# Patient Record
Sex: Female | Born: 1935 | Race: White | Hispanic: No | Smoking: Never smoker
Health system: Southern US, Community
[De-identification: ages and names within clinical notes are randomized; demographics above are authoritative.]

## PROBLEM LIST (undated history)

## (undated) DIAGNOSIS — S32000A Wedge compression fracture of unspecified lumbar vertebra, initial encounter for closed fracture: Secondary | ICD-10-CM

## (undated) DIAGNOSIS — I5022 Chronic systolic (congestive) heart failure: Secondary | ICD-10-CM

## (undated) DIAGNOSIS — I2542 Coronary artery dissection: Secondary | ICD-10-CM

## (undated) DIAGNOSIS — M109 Gout, unspecified: Secondary | ICD-10-CM

## (undated) DIAGNOSIS — Z9889 Other specified postprocedural states: Secondary | ICD-10-CM

## (undated) DIAGNOSIS — K219 Gastro-esophageal reflux disease without esophagitis: Secondary | ICD-10-CM

## (undated) DIAGNOSIS — Z8542 Personal history of malignant neoplasm of other parts of uterus: Secondary | ICD-10-CM

## (undated) DIAGNOSIS — I341 Nonrheumatic mitral (valve) prolapse: Secondary | ICD-10-CM

## (undated) DIAGNOSIS — I10 Essential (primary) hypertension: Secondary | ICD-10-CM

## (undated) DIAGNOSIS — M519 Unspecified thoracic, thoracolumbar and lumbosacral intervertebral disc disorder: Secondary | ICD-10-CM

## (undated) DIAGNOSIS — M069 Rheumatoid arthritis, unspecified: Secondary | ICD-10-CM

## (undated) DIAGNOSIS — R112 Nausea with vomiting, unspecified: Secondary | ICD-10-CM

## (undated) DIAGNOSIS — I428 Other cardiomyopathies: Secondary | ICD-10-CM

## (undated) DIAGNOSIS — R001 Bradycardia, unspecified: Secondary | ICD-10-CM

## (undated) DIAGNOSIS — I214 Non-ST elevation (NSTEMI) myocardial infarction: Secondary | ICD-10-CM

## (undated) DIAGNOSIS — E785 Hyperlipidemia, unspecified: Secondary | ICD-10-CM

## (undated) DIAGNOSIS — I4821 Permanent atrial fibrillation: Secondary | ICD-10-CM

## (undated) HISTORY — PX: HAMMER TOE SURGERY: SHX385

## (undated) HISTORY — DX: Nonrheumatic mitral (valve) prolapse: I34.1

## (undated) HISTORY — PX: OTHER SURGICAL HISTORY: SHX169

## (undated) HISTORY — PX: APPENDECTOMY: SHX54

---

## 1998-07-10 HISTORY — PX: ABDOMINAL HYSTERECTOMY: SHX81

## 1999-02-02 ENCOUNTER — Encounter (INDEPENDENT_AMBULATORY_CARE_PROVIDER_SITE_OTHER): Payer: Self-pay

## 1999-02-02 ENCOUNTER — Other Ambulatory Visit: Admission: RE | Admit: 1999-02-02 | Discharge: 1999-02-02 | Payer: Self-pay | Admitting: *Deleted

## 1999-02-14 ENCOUNTER — Inpatient Hospital Stay (HOSPITAL_COMMUNITY): Admission: RE | Admit: 1999-02-14 | Discharge: 1999-02-18 | Payer: Self-pay | Admitting: *Deleted

## 1999-02-25 ENCOUNTER — Encounter: Admission: RE | Admit: 1999-02-25 | Discharge: 1999-05-26 | Payer: Self-pay | Admitting: *Deleted

## 1999-05-27 ENCOUNTER — Other Ambulatory Visit: Admission: RE | Admit: 1999-05-27 | Discharge: 1999-05-27 | Payer: Self-pay | Admitting: *Deleted

## 1999-07-11 DIAGNOSIS — I428 Other cardiomyopathies: Secondary | ICD-10-CM

## 1999-07-11 HISTORY — PX: LUMBAR LAMINECTOMY: SHX95

## 1999-07-11 HISTORY — DX: Other cardiomyopathies: I42.8

## 1999-08-19 ENCOUNTER — Encounter: Admission: RE | Admit: 1999-08-19 | Discharge: 1999-08-19 | Payer: Self-pay | Admitting: *Deleted

## 1999-08-19 ENCOUNTER — Other Ambulatory Visit: Admission: RE | Admit: 1999-08-19 | Discharge: 1999-08-19 | Payer: Self-pay | Admitting: *Deleted

## 1999-11-03 ENCOUNTER — Other Ambulatory Visit: Admission: RE | Admit: 1999-11-03 | Discharge: 1999-11-03 | Payer: Self-pay | Admitting: *Deleted

## 2000-02-17 ENCOUNTER — Other Ambulatory Visit: Admission: RE | Admit: 2000-02-17 | Discharge: 2000-02-17 | Payer: Self-pay | Admitting: *Deleted

## 2000-05-28 ENCOUNTER — Encounter: Payer: Self-pay | Admitting: Neurosurgery

## 2000-05-28 ENCOUNTER — Inpatient Hospital Stay (HOSPITAL_COMMUNITY): Admission: RE | Admit: 2000-05-28 | Discharge: 2000-05-30 | Payer: Self-pay | Admitting: Neurosurgery

## 2000-05-28 ENCOUNTER — Encounter (INDEPENDENT_AMBULATORY_CARE_PROVIDER_SITE_OTHER): Payer: Self-pay | Admitting: *Deleted

## 2000-06-20 ENCOUNTER — Other Ambulatory Visit: Admission: RE | Admit: 2000-06-20 | Discharge: 2000-06-20 | Payer: Self-pay | Admitting: *Deleted

## 2000-10-19 ENCOUNTER — Other Ambulatory Visit: Admission: RE | Admit: 2000-10-19 | Discharge: 2000-10-19 | Payer: Self-pay | Admitting: *Deleted

## 2001-04-10 ENCOUNTER — Other Ambulatory Visit: Admission: RE | Admit: 2001-04-10 | Discharge: 2001-04-10 | Payer: Self-pay | Admitting: *Deleted

## 2001-07-08 ENCOUNTER — Encounter: Payer: Self-pay | Admitting: Internal Medicine

## 2001-07-08 ENCOUNTER — Ambulatory Visit (HOSPITAL_COMMUNITY): Admission: RE | Admit: 2001-07-08 | Discharge: 2001-07-08 | Payer: Self-pay | Admitting: Internal Medicine

## 2001-10-22 ENCOUNTER — Other Ambulatory Visit: Admission: RE | Admit: 2001-10-22 | Discharge: 2001-10-22 | Payer: Self-pay | Admitting: *Deleted

## 2002-04-25 ENCOUNTER — Other Ambulatory Visit: Admission: RE | Admit: 2002-04-25 | Discharge: 2002-04-25 | Payer: Self-pay | Admitting: *Deleted

## 2002-07-08 ENCOUNTER — Ambulatory Visit (HOSPITAL_COMMUNITY): Admission: RE | Admit: 2002-07-08 | Discharge: 2002-07-08 | Payer: Self-pay | Admitting: Internal Medicine

## 2002-07-08 ENCOUNTER — Encounter: Payer: Self-pay | Admitting: Internal Medicine

## 2002-10-14 ENCOUNTER — Other Ambulatory Visit: Admission: RE | Admit: 2002-10-14 | Discharge: 2002-10-14 | Payer: Self-pay | Admitting: *Deleted

## 2003-04-14 ENCOUNTER — Other Ambulatory Visit: Admission: RE | Admit: 2003-04-14 | Discharge: 2003-04-14 | Payer: Self-pay | Admitting: *Deleted

## 2003-07-14 ENCOUNTER — Ambulatory Visit (HOSPITAL_COMMUNITY): Admission: RE | Admit: 2003-07-14 | Discharge: 2003-07-14 | Payer: Self-pay | Admitting: Internal Medicine

## 2003-10-13 ENCOUNTER — Other Ambulatory Visit: Admission: RE | Admit: 2003-10-13 | Discharge: 2003-10-13 | Payer: Self-pay | Admitting: *Deleted

## 2004-07-14 ENCOUNTER — Ambulatory Visit (HOSPITAL_COMMUNITY): Admission: RE | Admit: 2004-07-14 | Discharge: 2004-07-14 | Payer: Self-pay | Admitting: Internal Medicine

## 2005-05-05 ENCOUNTER — Ambulatory Visit: Payer: Self-pay | Admitting: Cardiology

## 2005-07-17 ENCOUNTER — Ambulatory Visit (HOSPITAL_COMMUNITY): Admission: RE | Admit: 2005-07-17 | Discharge: 2005-07-17 | Payer: Self-pay | Admitting: Internal Medicine

## 2005-10-29 ENCOUNTER — Emergency Department (HOSPITAL_COMMUNITY): Admission: EM | Admit: 2005-10-29 | Discharge: 2005-10-29 | Payer: Self-pay | Admitting: Emergency Medicine

## 2005-10-30 ENCOUNTER — Emergency Department (HOSPITAL_COMMUNITY): Admission: EM | Admit: 2005-10-30 | Discharge: 2005-10-30 | Payer: Self-pay | Admitting: Emergency Medicine

## 2006-07-19 ENCOUNTER — Ambulatory Visit (HOSPITAL_COMMUNITY): Admission: RE | Admit: 2006-07-19 | Discharge: 2006-07-19 | Payer: Self-pay | Admitting: Internal Medicine

## 2006-07-31 ENCOUNTER — Encounter: Admission: RE | Admit: 2006-07-31 | Discharge: 2006-07-31 | Payer: Self-pay | Admitting: Internal Medicine

## 2006-08-02 ENCOUNTER — Ambulatory Visit: Payer: Self-pay | Admitting: Cardiology

## 2006-08-03 ENCOUNTER — Ambulatory Visit: Payer: Self-pay | Admitting: Cardiology

## 2006-08-03 ENCOUNTER — Ambulatory Visit (HOSPITAL_COMMUNITY): Admission: RE | Admit: 2006-08-03 | Discharge: 2006-08-03 | Payer: Self-pay | Admitting: Cardiology

## 2007-01-15 ENCOUNTER — Encounter: Admission: RE | Admit: 2007-01-15 | Discharge: 2007-01-15 | Payer: Self-pay

## 2007-04-30 ENCOUNTER — Ambulatory Visit: Payer: Self-pay | Admitting: Cardiology

## 2007-05-07 ENCOUNTER — Encounter (INDEPENDENT_AMBULATORY_CARE_PROVIDER_SITE_OTHER): Payer: Self-pay | Admitting: *Deleted

## 2007-05-07 LAB — CONVERTED CEMR LAB
ALT: 17 units/L
BUN: 18 mg/dL
CO2: 26 meq/L
Calcium: 9.6 mg/dL
Chloride: 105 meq/L
Creatinine, Ser: 0.83 mg/dL
Potassium: 4 meq/L
Total Protein: 7.8 g/dL

## 2007-05-13 ENCOUNTER — Ambulatory Visit: Payer: Self-pay | Admitting: Cardiology

## 2007-05-16 ENCOUNTER — Ambulatory Visit: Payer: Self-pay | Admitting: Cardiology

## 2007-05-22 ENCOUNTER — Ambulatory Visit: Payer: Self-pay | Admitting: Cardiovascular Disease

## 2007-05-31 ENCOUNTER — Ambulatory Visit: Payer: Self-pay | Admitting: Cardiology

## 2007-06-13 ENCOUNTER — Ambulatory Visit (HOSPITAL_COMMUNITY): Admission: RE | Admit: 2007-06-13 | Discharge: 2007-06-13 | Payer: Self-pay | Admitting: Internal Medicine

## 2007-06-14 ENCOUNTER — Ambulatory Visit: Payer: Self-pay | Admitting: Cardiology

## 2007-06-28 ENCOUNTER — Ambulatory Visit: Payer: Self-pay | Admitting: Cardiology

## 2007-07-09 ENCOUNTER — Ambulatory Visit: Payer: Self-pay | Admitting: Cardiovascular Disease

## 2007-07-23 ENCOUNTER — Ambulatory Visit: Payer: Self-pay | Admitting: Cardiology

## 2007-07-25 ENCOUNTER — Encounter (HOSPITAL_COMMUNITY): Admission: RE | Admit: 2007-07-25 | Discharge: 2007-08-24 | Payer: Self-pay

## 2007-07-29 ENCOUNTER — Ambulatory Visit (HOSPITAL_COMMUNITY): Admission: RE | Admit: 2007-07-29 | Discharge: 2007-07-29 | Payer: Self-pay | Admitting: Internal Medicine

## 2007-08-20 ENCOUNTER — Ambulatory Visit: Payer: Self-pay | Admitting: Cardiology

## 2007-08-26 ENCOUNTER — Encounter (HOSPITAL_COMMUNITY): Admission: RE | Admit: 2007-08-26 | Discharge: 2007-09-25 | Payer: Self-pay

## 2007-09-17 ENCOUNTER — Ambulatory Visit: Payer: Self-pay | Admitting: Cardiology

## 2007-09-25 ENCOUNTER — Ambulatory Visit: Payer: Self-pay | Admitting: Cardiology

## 2007-09-27 ENCOUNTER — Encounter (HOSPITAL_COMMUNITY): Admission: RE | Admit: 2007-09-27 | Discharge: 2007-10-27 | Payer: Self-pay

## 2007-10-15 ENCOUNTER — Ambulatory Visit: Payer: Self-pay | Admitting: Cardiology

## 2007-10-21 ENCOUNTER — Ambulatory Visit: Payer: Self-pay | Admitting: Cardiology

## 2007-10-21 LAB — CONVERTED CEMR LAB
Calcium: 9.6 mg/dL (ref 8.4–10.5)
Creatinine, Ser: 0.9 mg/dL (ref 0.4–1.2)
GFR calc Af Amer: 79 mL/min
Sodium: 144 meq/L (ref 135–145)

## 2007-11-13 ENCOUNTER — Ambulatory Visit: Payer: Self-pay | Admitting: Cardiology

## 2007-12-11 ENCOUNTER — Ambulatory Visit: Payer: Self-pay | Admitting: Cardiology

## 2007-12-17 ENCOUNTER — Encounter: Admission: RE | Admit: 2007-12-17 | Discharge: 2007-12-17 | Payer: Self-pay

## 2007-12-20 ENCOUNTER — Ambulatory Visit: Payer: Self-pay | Admitting: Cardiology

## 2008-01-02 ENCOUNTER — Ambulatory Visit: Payer: Self-pay | Admitting: Cardiology

## 2008-01-09 ENCOUNTER — Ambulatory Visit: Payer: Self-pay | Admitting: Cardiology

## 2008-01-16 ENCOUNTER — Ambulatory Visit: Payer: Self-pay | Admitting: Internal Medicine

## 2008-01-17 ENCOUNTER — Encounter: Payer: Self-pay | Admitting: Gastroenterology

## 2008-02-13 ENCOUNTER — Ambulatory Visit: Payer: Self-pay | Admitting: Gastroenterology

## 2008-02-18 ENCOUNTER — Ambulatory Visit: Payer: Self-pay | Admitting: Cardiology

## 2008-02-26 ENCOUNTER — Ambulatory Visit: Payer: Self-pay | Admitting: Cardiology

## 2008-03-03 ENCOUNTER — Ambulatory Visit: Payer: Self-pay | Admitting: Internal Medicine

## 2008-03-03 ENCOUNTER — Encounter: Payer: Self-pay | Admitting: Gastroenterology

## 2008-03-03 ENCOUNTER — Ambulatory Visit (HOSPITAL_COMMUNITY): Admission: RE | Admit: 2008-03-03 | Discharge: 2008-03-03 | Payer: Self-pay | Admitting: Internal Medicine

## 2008-03-04 ENCOUNTER — Telehealth (INDEPENDENT_AMBULATORY_CARE_PROVIDER_SITE_OTHER): Payer: Self-pay | Admitting: *Deleted

## 2008-03-06 ENCOUNTER — Encounter: Payer: Self-pay | Admitting: Gastroenterology

## 2008-03-09 ENCOUNTER — Ambulatory Visit: Payer: Self-pay | Admitting: Cardiology

## 2008-03-19 ENCOUNTER — Ambulatory Visit (HOSPITAL_COMMUNITY): Admission: RE | Admit: 2008-03-19 | Discharge: 2008-03-19 | Payer: Self-pay | Admitting: Gastroenterology

## 2008-03-19 ENCOUNTER — Ambulatory Visit: Payer: Self-pay | Admitting: Gastroenterology

## 2008-03-23 ENCOUNTER — Encounter: Payer: Self-pay | Admitting: Gastroenterology

## 2008-03-25 ENCOUNTER — Ambulatory Visit: Payer: Self-pay | Admitting: Cardiology

## 2008-04-13 ENCOUNTER — Ambulatory Visit: Payer: Self-pay | Admitting: Cardiology

## 2008-05-07 ENCOUNTER — Ambulatory Visit: Payer: Self-pay | Admitting: Cardiology

## 2008-06-11 ENCOUNTER — Ambulatory Visit: Payer: Self-pay | Admitting: Cardiology

## 2008-06-18 ENCOUNTER — Ambulatory Visit: Payer: Self-pay | Admitting: Cardiology

## 2008-07-06 ENCOUNTER — Ambulatory Visit: Payer: Self-pay | Admitting: Cardiology

## 2008-08-03 ENCOUNTER — Ambulatory Visit: Payer: Self-pay | Admitting: Cardiology

## 2008-08-24 ENCOUNTER — Ambulatory Visit: Payer: Self-pay | Admitting: Cardiology

## 2008-08-24 ENCOUNTER — Ambulatory Visit (HOSPITAL_COMMUNITY): Admission: RE | Admit: 2008-08-24 | Discharge: 2008-08-24 | Payer: Self-pay | Admitting: Internal Medicine

## 2008-08-26 ENCOUNTER — Encounter: Payer: Self-pay | Admitting: Gastroenterology

## 2008-09-04 ENCOUNTER — Ambulatory Visit: Payer: Self-pay | Admitting: Cardiology

## 2008-09-07 HISTORY — PX: HERNIA REPAIR: SHX51

## 2008-09-24 ENCOUNTER — Inpatient Hospital Stay (HOSPITAL_COMMUNITY): Admission: RE | Admit: 2008-09-24 | Discharge: 2008-09-27 | Payer: Self-pay | Admitting: Surgery

## 2008-09-24 ENCOUNTER — Encounter (INDEPENDENT_AMBULATORY_CARE_PROVIDER_SITE_OTHER): Payer: Self-pay | Admitting: Surgery

## 2008-10-01 ENCOUNTER — Ambulatory Visit: Payer: Self-pay | Admitting: Cardiology

## 2008-10-05 ENCOUNTER — Encounter: Payer: Self-pay | Admitting: Internal Medicine

## 2008-10-12 ENCOUNTER — Ambulatory Visit: Payer: Self-pay | Admitting: Cardiology

## 2008-10-29 ENCOUNTER — Ambulatory Visit: Payer: Self-pay | Admitting: Cardiology

## 2008-11-02 ENCOUNTER — Encounter (INDEPENDENT_AMBULATORY_CARE_PROVIDER_SITE_OTHER): Payer: Self-pay | Admitting: *Deleted

## 2008-11-19 ENCOUNTER — Ambulatory Visit: Payer: Self-pay | Admitting: Cardiology

## 2008-12-03 ENCOUNTER — Ambulatory Visit (HOSPITAL_COMMUNITY): Admission: RE | Admit: 2008-12-03 | Discharge: 2008-12-03 | Payer: Self-pay | Admitting: Ophthalmology

## 2008-12-09 ENCOUNTER — Telehealth: Payer: Self-pay | Admitting: Cardiology

## 2008-12-24 ENCOUNTER — Ambulatory Visit: Payer: Self-pay | Admitting: Cardiology

## 2009-01-07 ENCOUNTER — Ambulatory Visit (HOSPITAL_COMMUNITY): Admission: RE | Admit: 2009-01-07 | Discharge: 2009-01-07 | Payer: Self-pay | Admitting: Ophthalmology

## 2009-01-27 ENCOUNTER — Telehealth: Payer: Self-pay | Admitting: Cardiology

## 2009-01-28 ENCOUNTER — Ambulatory Visit: Payer: Self-pay | Admitting: Cardiology

## 2009-02-01 ENCOUNTER — Telehealth (INDEPENDENT_AMBULATORY_CARE_PROVIDER_SITE_OTHER): Payer: Self-pay

## 2009-02-22 ENCOUNTER — Encounter: Payer: Self-pay | Admitting: *Deleted

## 2009-03-04 ENCOUNTER — Ambulatory Visit: Payer: Self-pay | Admitting: Cardiology

## 2009-03-04 LAB — CONVERTED CEMR LAB: POC INR: 2.9

## 2009-04-01 ENCOUNTER — Ambulatory Visit: Payer: Self-pay | Admitting: Cardiology

## 2009-04-01 LAB — CONVERTED CEMR LAB: POC INR: 3.6

## 2009-04-29 ENCOUNTER — Ambulatory Visit: Payer: Self-pay | Admitting: Cardiology

## 2009-05-27 ENCOUNTER — Ambulatory Visit: Payer: Self-pay | Admitting: Cardiology

## 2009-07-01 ENCOUNTER — Ambulatory Visit: Payer: Self-pay | Admitting: Cardiology

## 2009-07-29 ENCOUNTER — Ambulatory Visit: Payer: Self-pay | Admitting: Cardiology

## 2009-07-29 LAB — CONVERTED CEMR LAB: POC INR: 2.6

## 2009-08-17 ENCOUNTER — Encounter (INDEPENDENT_AMBULATORY_CARE_PROVIDER_SITE_OTHER): Payer: Self-pay | Admitting: *Deleted

## 2009-08-26 ENCOUNTER — Ambulatory Visit: Payer: Self-pay | Admitting: Cardiology

## 2009-08-26 DIAGNOSIS — R609 Edema, unspecified: Secondary | ICD-10-CM

## 2009-08-26 DIAGNOSIS — I4891 Unspecified atrial fibrillation: Secondary | ICD-10-CM

## 2009-08-26 LAB — CONVERTED CEMR LAB: POC INR: 2.9

## 2009-08-30 ENCOUNTER — Ambulatory Visit (HOSPITAL_COMMUNITY): Admission: RE | Admit: 2009-08-30 | Discharge: 2009-08-30 | Payer: Self-pay | Admitting: Internal Medicine

## 2009-09-23 ENCOUNTER — Ambulatory Visit: Payer: Self-pay | Admitting: Cardiology

## 2009-09-23 LAB — CONVERTED CEMR LAB: POC INR: 2.4

## 2009-10-21 ENCOUNTER — Ambulatory Visit: Payer: Self-pay | Admitting: Cardiology

## 2009-11-18 ENCOUNTER — Ambulatory Visit: Payer: Self-pay | Admitting: Cardiology

## 2009-12-16 ENCOUNTER — Ambulatory Visit: Payer: Self-pay | Admitting: Cardiology

## 2009-12-16 LAB — CONVERTED CEMR LAB: POC INR: 2.5

## 2010-01-13 ENCOUNTER — Ambulatory Visit: Payer: Self-pay | Admitting: Cardiology

## 2010-02-10 ENCOUNTER — Ambulatory Visit: Payer: Self-pay | Admitting: Cardiology

## 2010-03-03 ENCOUNTER — Ambulatory Visit: Payer: Self-pay | Admitting: Cardiology

## 2010-03-10 ENCOUNTER — Ambulatory Visit: Payer: Self-pay | Admitting: Cardiology

## 2010-03-10 LAB — CONVERTED CEMR LAB: POC INR: 2.7

## 2010-03-16 ENCOUNTER — Encounter: Admission: RE | Admit: 2010-03-16 | Discharge: 2010-03-16 | Payer: Self-pay | Admitting: Unknown Physician Specialty

## 2010-03-17 ENCOUNTER — Encounter (HOSPITAL_COMMUNITY): Admission: RE | Admit: 2010-03-17 | Discharge: 2010-04-16 | Payer: Self-pay | Admitting: Rheumatology

## 2010-03-29 ENCOUNTER — Encounter: Payer: Self-pay | Admitting: Cardiology

## 2010-04-07 ENCOUNTER — Ambulatory Visit: Payer: Self-pay | Admitting: Cardiology

## 2010-04-07 LAB — CONVERTED CEMR LAB: POC INR: 2.9

## 2010-04-22 ENCOUNTER — Encounter: Payer: Self-pay | Admitting: Cardiology

## 2010-05-05 ENCOUNTER — Ambulatory Visit: Payer: Self-pay | Admitting: Cardiology

## 2010-05-27 ENCOUNTER — Encounter: Payer: Self-pay | Admitting: Cardiology

## 2010-05-31 ENCOUNTER — Telehealth (INDEPENDENT_AMBULATORY_CARE_PROVIDER_SITE_OTHER): Payer: Self-pay

## 2010-06-06 ENCOUNTER — Encounter (INDEPENDENT_AMBULATORY_CARE_PROVIDER_SITE_OTHER): Payer: Self-pay | Admitting: *Deleted

## 2010-06-09 ENCOUNTER — Ambulatory Visit: Payer: Self-pay | Admitting: Cardiology

## 2010-06-09 LAB — CONVERTED CEMR LAB: POC INR: 3.1

## 2010-06-15 ENCOUNTER — Encounter: Payer: Self-pay | Admitting: Cardiology

## 2010-06-22 ENCOUNTER — Encounter
Admission: RE | Admit: 2010-06-22 | Discharge: 2010-06-22 | Payer: Self-pay | Source: Home / Self Care | Attending: Neurosurgery | Admitting: Neurosurgery

## 2010-06-23 ENCOUNTER — Encounter: Payer: Self-pay | Admitting: Cardiology

## 2010-06-29 ENCOUNTER — Encounter
Admission: RE | Admit: 2010-06-29 | Discharge: 2010-06-29 | Payer: Self-pay | Source: Home / Self Care | Attending: Neurosurgery | Admitting: Neurosurgery

## 2010-07-07 ENCOUNTER — Ambulatory Visit: Payer: Self-pay | Admitting: Cardiology

## 2010-07-07 LAB — CONVERTED CEMR LAB: POC INR: 4.1

## 2010-07-25 ENCOUNTER — Ambulatory Visit: Admission: RE | Admit: 2010-07-25 | Discharge: 2010-07-25 | Payer: Self-pay | Source: Home / Self Care

## 2010-07-29 ENCOUNTER — Encounter: Payer: Self-pay | Admitting: Cardiology

## 2010-07-31 ENCOUNTER — Encounter: Payer: Self-pay | Admitting: Internal Medicine

## 2010-08-03 ENCOUNTER — Other Ambulatory Visit (HOSPITAL_COMMUNITY): Payer: Self-pay | Admitting: Internal Medicine

## 2010-08-03 DIAGNOSIS — Z139 Encounter for screening, unspecified: Secondary | ICD-10-CM

## 2010-08-10 ENCOUNTER — Telehealth (INDEPENDENT_AMBULATORY_CARE_PROVIDER_SITE_OTHER): Payer: Self-pay | Admitting: *Deleted

## 2010-08-11 NOTE — Miscellaneous (Signed)
Summary: LABS CMP,05/07/2007  Clinical Lists Changes  Observations: Added new observation of CALCIUM: 9.6 mg/dL (16/04/9603 5:40) Added new observation of ALBUMIN: 4.2 g/dL (98/05/9146 8:29) Added new observation of PROTEIN, TOT: 7.8 g/dL (56/21/3086 5:78) Added new observation of SGPT (ALT): 17 units/L (05/07/2007 9:39) Added new observation of SGOT (AST): 22 units/L (05/07/2007 9:39) Added new observation of ALK PHOS: 56 units/L (05/07/2007 9:39) Added new observation of CREATININE: 0.83 mg/dL (46/96/2952 8:41) Added new observation of BUN: 18 mg/dL (32/44/0102 7:25) Added new observation of BG RANDOM: 86 mg/dL (36/64/4034 7:42) Added new observation of CO2 PLSM/SER: 26 meq/L (05/07/2007 9:39) Added new observation of CL SERUM: 105 meq/L (05/07/2007 9:39) Added new observation of K SERUM: 4.0 meq/L (05/07/2007 9:39) Added new observation of NA: 145 meq/L (05/07/2007 9:39)

## 2010-08-11 NOTE — Medication Information (Signed)
Summary: ccr-lr  Anticoagulant Therapy  Managed by: Vashti Hey, RN PCP: [name] Supervising MD: [name] MD, [name] Indication 1: Atrial Fibrillation (ICD-427.31) Lab Used: [facility] [place] Site: [place] INR POC 4.1  Dietary changes: no    Health status changes: no    Bleeding/hemorrhagic complications: no    Recent/future hospitalizations: no    Any changes in medication regimen? no    Recent/future dental: no  Any missed doses?: no       Is patient compliant with meds? yes       Allergies: No Known Drug Allergies  Anticoagulation Management History:      The patient is taking warfarin and comes in today for a routine follow up visit.  Positive risk factors for bleeding include an age of 75 years or older.  The bleeding index is 'intermediate risk'.  Positive CHADS2 values include Age > 75 years old.  The start date was 03/08/1998.  Anticoagulation responsible provider: [name] MD, [name].  INR POC: 4.1.  Cuvette Lot#: [id number].  Exp: 10/11.    Anticoagulation Management Assessment/Plan:      The patient's current anticoagulation dose is Coumadin 5 mg tabs: Take 1/2 tablet by mouth once a day.  The target INR is 2 - 3.  The next INR is due 07/25/2010.  Anticoagulation instructions were given to patient.  Results were reviewed/authorized by Vashti Hey, RN.  She was notified by Vashti Hey RN.         Prior Anticoagulation Instructions: INR 3.1 Take coumadin 1/2 tablet tonight then resume 1 tablet once daily except 1/2 tablet on Tuesdays   Current Anticoagulation Instructions: INR 4.1 Hold coumadin tonight then decrease dose to 1 tablet once daily except 1/2 tablets on Tuesdays and Fridays

## 2010-08-11 NOTE — Medication Information (Signed)
Summary: ccr-lr  Anticoagulant Therapy  Managed by: Vashti Hey, RN PCP: [name] Supervising MD: Daleen Squibb MD, Maisie Fus Indication 1: Atrial Fibrillation (ICD-427.31) Lab Used: [facility]  Site: [place] INR POC 3.1  Dietary changes: no    Health status changes: no    Bleeding/hemorrhagic complications: no    Recent/future hospitalizations: no    Any changes in medication regimen? no    Recent/future dental: no  Any missed doses?: no       Is patient compliant with meds? yes       Allergies: No Known Drug Allergies  Anticoagulation Management History:      The patient is taking warfarin and comes in today for a routine follow up visit.  Positive risk factors for bleeding include an age of 75 years or older.  The bleeding index is 'intermediate risk'.  Positive CHADS2 values include Age > 28 years old.  The start date was 03/08/1998.  Anticoagulation responsible provider: Daleen Squibb MD, Maisie Fus.  INR POC: 3.1.  Cuvette Lot#: [id number].  Exp: 10/11.    Anticoagulation Management Assessment/Plan:      The patient's current anticoagulation dose is Coumadin 5 mg tabs: Take 1/2 tablet by mouth once a day.  The target INR is 2 - 3.  The next INR is due 07/07/2010.  Anticoagulation instructions were given to patient.  Results were reviewed/authorized by Vashti Hey, RN.  She was notified by Vashti Hey RN.         Prior Anticoagulation Instructions: INR 3.0 Continue coumadin 5mg  once daily except 2.5mg  on Tuesdays  Current Anticoagulation Instructions: INR 3.1 Take coumadin 1/2 tablet tonight then resume 1 tablet once daily except 1/2 tablet on Tuesdays

## 2010-08-11 NOTE — Medication Information (Signed)
Summary: ccr-lr  Anticoagulant Therapy  Managed by: Vashti Hey, RN PCP: [name] Supervising MD: [name] MD, [name] Indication 1: Atrial Fibrillation (ICD-427.31) Lab Used: [facility] [place] Site: [place] INR POC 2.6  Dietary changes: no    Health status changes: no    Bleeding/hemorrhagic complications: no    Recent/future hospitalizations: no    Any changes in medication regimen? no    Recent/future dental: no  Any missed doses?: no       Is patient compliant with meds? yes       Allergies: No Known Drug Allergies  Anticoagulation Management History:      The patient is taking warfarin and comes in today for a routine follow up visit.  Positive risk factors for bleeding include an age of 75 years or older.  The bleeding index is 'intermediate risk'.  Negative CHADS2 values include Age > 6 years old.  The start date was 03/08/1998.  Anticoagulation responsible provider: [name] MD, [name].  INR POC: 2.6.  Cuvette Lot#: [id number].  Exp: 10/11.    Anticoagulation Management Assessment/Plan:      The patient's current anticoagulation dose is Coumadin 5 mg tabs: Take 1/2 tablet by mouth once a day.  The target INR is 2 - 3.  The next INR is due 11/18/2009.  Anticoagulation instructions were given to patient.  Results were reviewed/authorized by Vashti Hey, RN.  She was notified by Vashti Hey RN.         Prior Anticoagulation Instructions: INR 2.4 Continue coumadin 5mg  once daily except 2.5mg  on Tuesdays  Current Anticoagulation Instructions: INR 2.6 Continue coumadin 5mg  once daily except 2.5mg  on Tuesdays

## 2010-08-11 NOTE — Medication Information (Signed)
Summary: ccr-lr  Anticoagulant Therapy  Managed by: Vashti Hey, RN Supervising MD: Daleen Squibb MD, Maisie Fus Indication 1: Atrial Fibrillation (ICD-427.31) Lab Used: [facility]  Site: [place] INR POC 2.9  Dietary changes: no    Health status changes: no    Bleeding/hemorrhagic complications: no    Recent/future hospitalizations: no    Any changes in medication regimen? no    Recent/future dental: no  Any missed doses?: no       Is patient compliant with meds? yes       Allergies: No Known Drug Allergies  Anticoagulation Management History:      The patient is taking warfarin and comes in today for a routine follow up visit.  Positive risk factors for bleeding include an age of 75 years or older.  The bleeding index is 'intermediate risk'.  Negative CHADS2 values include Age > 32 years old.  The start date was 03/08/1998.  Anticoagulation responsible provider: Daleen Squibb MD, Maisie Fus.  INR POC: 2.9.  Cuvette Lot#: [id number].  Exp: 10/11.    Anticoagulation Management Assessment/Plan:      The patient's current anticoagulation dose is Coumadin 5 mg tabs: Take 1/2 tablet by mouth once a day.  The target INR is 2 - 3.  The next INR is due 09/23/2009.  Anticoagulation instructions were given to patient.  Results were reviewed/authorized by Vashti Hey, RN.  She was notified by Vashti Hey RN.         Prior Anticoagulation Instructions: INR 2.6 Continue coumadin 5mg  once daily except 2.5mg  on Tuesdays  Current Anticoagulation Instructions: INR 2.9 Continue coumadin 5mg  once daily except 2.5mg  on Tuesdays

## 2010-08-11 NOTE — Medication Information (Signed)
Summary: ccr-lr  Anticoagulant Therapy  Managed by: Vashti Hey, RN PCP: [name] Supervising MD: [name] MD, [name] Indication 1: Atrial Fibrillation (ICD-427.31) Lab Used: [facility] [place] Site: [place] INR POC 2.9  Dietary changes: no    Health status changes: yes       Details: Fx lower back  Bleeding/hemorrhagic complications: no    Recent/future hospitalizations: no    Any changes in medication regimen? yes       Details: Hydrocodone 4 x day  Recent/future dental: no  Any missed doses?: no       Is patient compliant with meds? yes       Allergies: No Known Drug Allergies  Anticoagulation Management History:      The patient is taking warfarin and comes in today for a routine follow up visit.  Positive risk factors for bleeding include an age of 78 years or older.  The bleeding index is 'intermediate risk'.  Negative CHADS2 values include Age > 61 years old.  The start date was 03/08/1998.  Anticoagulation responsible provider: [name] MD, [name].  INR POC: 2.9.  Cuvette Lot#: [id number].  Exp: 10/11.    Anticoagulation Management Assessment/Plan:      The patient's current anticoagulation dose is Coumadin 5 mg tabs: Take 1/2 tablet by mouth once a day.  The target INR is 2 - 3.  The next INR is due 05/05/2010.  Anticoagulation instructions were given to patient.  Results were reviewed/authorized by Vashti Hey, RN.  She was notified by Vashti Hey RN.         Prior Anticoagulation Instructions: INR 2.7 Continue coumadin 5mg  once daily except 2.5mg  on Tuesdays   Current Anticoagulation Instructions: INR 2.9 Continue coumadin 5mg  once daily except 2.5mg  on Tuesdays

## 2010-08-11 NOTE — Assessment & Plan Note (Signed)
Summary: 6 mth f/u per checkout on 2009-09-03/t   Visit Type:  Follow-up Primary Marissa [name]:  [name]  CC:  edema in ankles and feet.  History of Present Illness: [name] returns today for evaluation of her chronic atrial ablation. She also has a long history of lower shin edema and some hypertension.  She denies any palpitations or symptoms of atrial ablation. She denies any bleeding or melena on Coumadin. She's had no PND or orthopnea. Her edema has been under reasonable control. She wears support hose. She is followed closely by Dr. [name].  Current Medications (verified): 1)  Metoprolol Succinate 50 Mg Xr24h-Tab (Metoprolol Succinate) .... Take 1/2 Tablet Twice Daily 2)  Coumadin 5 Mg Tabs (Warfarin Sodium) .... Take 1/2 Tablet By Mouth Once A Day 3)  Hyzaar 50-12.5 Mg Tabs (Losartan Potassium-Hctz) .... Take 1 Tablet By Mouth Once A Day 4)  Lanoxin 0.125 Mg Tabs (Digoxin) .... Take 1 Tablet By Mouth Once A Day 5)  Furosemide 20 Mg Tabs (Furosemide) .... Take One Tablet By Mouth Daily.  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  Alcohol Use - no Regular Exercise - no Drug Use - no  Risk Factors: Exercise: no (2009/09/03)  Risk Factors: Smoking Status: never (09/03/09)  Review of Systems       negative other than history of present illness  Vital Signs:  Patient profile:   77 year old female Weight:      181 pounds BMI:     30.23 Pulse rate:   65 / minute BP sitting:   143 / 68  (right arm)  Vitals Entered  By: Dreama Saa, CNA (March 03, 2010 9:29 AM)  Physical Exam  General:  overweight, in no acute distress Head:  normocephalic and atraumatic Eyes:  PERRLA/EOM intact; conjunctiva and lids normal. Neck:  Neck supple, no JVD. No masses, thyromegaly or abnormal cervical nodes. Chest Wall:  no deformities or breast masses noted Lungs:  Clear bilaterally to auscultation and percussion. Heart:  PMI nondisplaced, irregular rate and rhythm, verbal S1-S2, soft systolic murmur Msk:  decreased ROM.   Pulses:  pulses normal in all 4 extremities Extremities:  varicose veins and no sign of DVT,1+ left pedal edema and 1+ right pedal edema.   Neurologic:  Alert and oriented x 3. Skin:  Intact without lesions or rashes. Psych:  Normal affect.   Impression & Recommendations:  Problem # 1:  ATRIAL FIBRILLATION (ICD-427.31) Assessment Unchanged  Her updated medication list for this problem includes:    Metoprolol Succinate 50 Mg Xr24h-tab (Metoprolol succinate) .Marland Kitchen... Take 1/2 tablet twice daily    Coumadin 5 Mg Tabs (Warfarin sodium) .Marland Kitchen... Take 1/2 tablet by mouth once a day    Lanoxin 0.125 Mg Tabs (Digoxin) .Marland Kitchen... Take 1 tablet by mouth once a day  Problem # 2:  EDEMA (ICD-782.3) Assessment: Unchanged  Problem # 3:  COUMADIN THERAPY (ICD-V58.61) Assessment: Unchanged  Patient Instructions: 1)  Your physician recommends that you schedule a follow-up appointment in: 1 year 2)  Your physician recommends that you continue on your current medications as directed. Please refer to the Current Medication list given to you  today.

## 2010-08-11 NOTE — Letter (Signed)
Summary: Appointment - Missed  [facility] [place]  [address] [place] [address]   Phone: [phone]  Fax: [phone]     June 06, 2010 MRN: [id number]   [facility] [address] [place], [place]  [address]   Dear Ms. Esterline,  Our records indicate you missed your appointment on       06/06/10 [facility]IN CLIN[facility]                   It is very important that we reach you to reschedule this appointment. We look forward to participating in your health care needs. Please contact us at the number listed above at your earliest convenience to reschedule this appointment.     Sincerely,    Glass blower/designer

## 2010-08-11 NOTE — Medication Information (Signed)
Summary: ccr-lr  Anticoagulant Therapy  Managed by: Vashti Hey, RN PCP: [name] Supervising MD: Diona Browner MD, Remi Deter Indication 1: Atrial Fibrillation (ICD-427.31) Lab Used: [facility]  Site: [place] INR POC 2.6  Dietary changes: no    Health status changes: no    Bleeding/hemorrhagic complications: no    Recent/future hospitalizations: no    Any changes in medication regimen? no    Recent/future dental: no  Any missed doses?: no       Is patient compliant with meds? yes       Allergies: No Known Drug Allergies  Anticoagulation Management History:      The patient is taking warfarin and comes in today for a routine follow up visit.  Positive risk factors for bleeding include an age of 31 years or older.  The bleeding index is 'intermediate risk'.  Negative CHADS2 values include Age > 60 years old.  The start date was 03/08/1998.  Anticoagulation responsible provider: Diona Browner MD, Remi Deter.  INR POC: 2.6.  Cuvette Lot#: [id number].  Exp: 10/11.    Anticoagulation Management Assessment/Plan:      The patient's current anticoagulation dose is Coumadin 5 mg tabs: Take 1/2 tablet by mouth once a day.  The target INR is 2 - 3.  The next INR is due 03/10/2010.  Anticoagulation instructions were given to patient.  Results were reviewed/authorized by Vashti Hey, RN.  She was notified by Vashti Hey RN.         Prior Anticoagulation Instructions: INR 2.1 Continue coumadin 5mg  once daily except 2.5mg  on Tuesdays  Current Anticoagulation Instructions: INR 2.6 Continue coumadin 5mg  once daily except 2.5mg  on Tuesdays

## 2010-08-11 NOTE — Progress Notes (Signed)
Summary: pt would like seen today right leg swollen  Phone Note Call from Patient Call back at Home Phone [phone]   Caller: pt Reason for Call: Talk to Nurse Summary of Call: pt has been having her right leg swell up on and off and it is causing pain she would like seen today. Initial call taken by: Faythe Ghee,  May 31, 2010 8:35 AM  Follow-up for Phone Call        Pt. c/o pain from hip to toes in right leg that has been occuring off and on for 3 months. Pt. advised to contact her PCP, Dr. [name]. Follow-up by: Larita Fife Via LPN,  May 31, 2010 10:19 AM

## 2010-08-11 NOTE — Medication Information (Signed)
Summary: ccr-lr  Anticoagulant Therapy  Managed by: Vashti Hey, RN PCP: [name] Supervising MD: Diona Browner MD, Remi Deter Indication 1: Atrial Fibrillation (ICD-427.31) Lab Used: [facility] [place] Site: [place] INR POC 3.0  Dietary changes: no    Health status changes: no    Bleeding/hemorrhagic complications: no    Recent/future hospitalizations: no    Any changes in medication regimen? no    Recent/future dental: no  Any missed doses?: no       Is patient compliant with meds? yes       Allergies: No Known Drug Allergies  Anticoagulation Management History:      The patient is taking warfarin and comes in today for a routine follow up visit.  Positive risk factors for bleeding include an age of 31 years or older.  The bleeding index is 'intermediate risk'.  Negative CHADS2 values include Age > 27 years old.  The start date was 03/08/1998.  Anticoagulation responsible provider: Diona Browner MD, Remi Deter.  INR POC: 3.0.  Cuvette Lot#: [id number].  Exp: 10/11.    Anticoagulation Management Assessment/Plan:      The patient's current anticoagulation dose is Coumadin 5 mg tabs: Take 1/2 tablet by mouth once a day.  The target INR is 2 - 3.  The next INR is due 06/06/2010.  Anticoagulation instructions were given to patient.  Results were reviewed/authorized by Vashti Hey, RN.  She was notified by Vashti Hey RN.         Prior Anticoagulation Instructions: INR 2.9 Continue coumadin 5mg  once daily except 2.5mg  on Tuesdays  Current Anticoagulation Instructions: INR 3.0 Continue coumadin 5mg  once daily except 2.5mg  on Tuesdays

## 2010-08-11 NOTE — Medication Information (Signed)
Summary: protime/tg  Anticoagulant Therapy  Managed by: Vashti Hey, RN PCP: [name] Supervising MD: [name] MD, [name] Indication 1: Atrial Fibrillation (ICD-427.31) Lab Used: [facility] [place] Site: [place] INR POC 2.7  Dietary changes: no    Health status changes: yes       Details: ruptured disc   Bleeding/hemorrhagic complications: no    Recent/future hospitalizations: no    Any changes in medication regimen? yes       Details: prednisone 4mg  dose pk and hydrocodone 5/500 for pain   Has 3 days prednisone left  Recent/future dental: no  Any missed doses?: no       Is patient compliant with meds? yes       Allergies: No Known Drug Allergies  Anticoagulation Management History:      The patient is taking warfarin and comes in today for a routine follow up visit.  Positive risk factors for bleeding include an age of 75 years or older.  The bleeding index is 'intermediate risk'.  Negative CHADS2 values include Age > 75 years old.  The start date was 03/08/1998.  Anticoagulation responsible provider: [name] MD, [name].  INR POC: 2.7.  Cuvette Lot#: [id number].  Exp: 10/11.    Anticoagulation Management Assessment/Plan:      The patient's current anticoagulation dose is Coumadin 5 mg tabs: Take 1/2 tablet by mouth once a day.  The target INR is 2 - 3.  The next INR is due 04/07/2010.  Anticoagulation instructions were given to patient.  Results were reviewed/authorized by Vashti Hey, RN.  She was notified by Vashti Hey RN.         Prior Anticoagulation Instructions: INR 2.6 Continue coumadin 5mg  once daily except 2.5mg  on Tuesdays  Current Anticoagulation Instructions: INR 2.7 Continue coumadin 5mg  once daily except 2.5mg  on Tuesdays

## 2010-08-11 NOTE — Medication Information (Signed)
Summary: ccr-lr  Anticoagulant Therapy  Managed by: Vashti Hey, RN Supervising MD: [name] MD, [name] Indication 1: Atrial Fibrillation (ICD-427.31) Lab Used: [facility] [place] Site: [place] INR POC 2.6  Dietary changes: no    Health status changes: no    Bleeding/hemorrhagic complications: no    Recent/future hospitalizations: no    Any changes in medication regimen? no    Recent/future dental: no  Any missed doses?: no       Is patient compliant with meds? yes       Allergies: No Known Drug Allergies  Anticoagulation Management History:      The patient is taking warfarin and comes in today for a routine follow up visit.  Positive risk factors for bleeding include an age of 75 years or older.  The bleeding index is 'intermediate risk'.  Negative CHADS2 values include Age > 54 years old.  The start date was 03/08/1998.  Anticoagulation responsible provider: [name] MD, [name].  INR POC: 2.6.  Cuvette Lot#: [id number].  Exp: 10/11.    Anticoagulation Management Assessment/Plan:      The patient's current anticoagulation dose is Coumadin 5 mg tabs: Take 1/2 tablet by mouth once a day.  The target INR is 2 - 3.  The next INR is due 08/26/2009.  Anticoagulation instructions were given to patient.  Results were reviewed/authorized by Vashti Hey, RN.  She was notified by Vashti Hey RN.         Prior Anticoagulation Instructions: INR 2.8 Continue coumadin 5mg  once daily except 2.5mg  on Tuesdays  Current Anticoagulation Instructions: INR 2.6 Continue coumadin 5mg  once daily except 2.5mg  on Tuesdays

## 2010-08-11 NOTE — Assessment & Plan Note (Signed)
Summary: ROV   Visit Type:  Follow-up Primary Provider:  [name]  CC:  no complaints today.  History of Present Illness: Marissa [name] returns today for evaluation and management of her chronic atrial fibrillation, hypertension, anticoagulation, and lower extremity edema. Other than increase in her edema, she has no complaints. She denies any palpitations. She has has right-sided atypical pain which is not sound cardiac. She denies any orthopnea, PND. She has had no syncope. She denies any problems with bleeding  Current Medications (verified): 1)  Metoprolol Succinate 50 Mg Xr24h-Tab (Metoprolol Succinate) .... Take 1/2 Tablet Twice Daily 2)  Coumadin 5 Mg Tabs (Warfarin Sodium) .... Take 1/2 Tablet By Mouth Once A Day 3)  Hyzaar 50-12.5 Mg Tabs (Losartan Potassium-Hctz) .... Take 1 Tablet By Mouth Once A Day 4)  Lanoxin 0.125 Mg Tabs (Digoxin) .... Take 1 Tablet By Mouth Once A Day 5)  Furosemide 20 Mg Tabs (Furosemide) .... Take One Tablet By Mouth Daily.  Allergies (verified): No Known Drug Allergies  Review of Systems       negative other than history of present illness  Vital Signs:  Patient profile:   77 year old female Height:      65 inches Weight:      185 pounds BMI:     30.90 Pulse rate:   81 / minute BP sitting:   151 / 80  (right arm)  Vitals Entered By: Dreama Saa, CNA (August 26, 2009 9:55 AM)  Physical Exam  General:  Well developed, well nourished, in no acute distress. Head:  normocephalic and atraumatic Eyes:  PERRLA/EOM intact; conjunctiva and lids normal. Neck:  Neck supple, no JVD. No masses, thyromegaly or abnormal cervical nodes. Chest Marissa [name]:  no deformities or breast masses noted Lungs:  Clear bilaterally to auscultation and percussion. Heart:  irregular rate and rhythm, variable S1-S2, nondisplaced PMI Abdomen:  Bowel sounds positive; abdomen soft and non-tender without masses, organomegaly, or hernias noted. No hepatosplenomegaly. Msk:   decreased ROM.   Pulses:  pulses normal in all 4 extremities Extremities:  varicose veins,1+ left pedal edema and trace right pedal edema.   Neurologic:  Alert and oriented x 3. Skin:  Intact without lesions or rashes. Psych:  Normal affect.   Problems:  Medical Problems Added: 1)  Dx of Edema  (ICD-782.3) 2)  Dx of Atrial Fibrillation  (ICD-427.31)  Impression & Recommendations:  Problem # 1:  ATRIAL FIBRILLATION (ICD-427.31) Assessment Unchanged  The following medications were removed from the medication list:    Toprol Xl 50 Mg Xr24h-tab (Metoprolol succinate) .Marland Kitchen... 1/2 tablet by mouth once a day Her updated medication list for this problem includes:    Metoprolol Succinate 50 Mg Xr24h-tab (Metoprolol succinate) .Marland Kitchen... Take 1/2 tablet twice daily    Coumadin 5 Mg Tabs (Warfarin sodium) .Marland Kitchen... Take 1/2 tablet by mouth once a day    Lanoxin 0.125 Mg Tabs (Digoxin) .Marland Kitchen... Take 1 tablet by mouth once a day  Problem # 2:  EDEMA (ICD-782.3) Assessment: Deteriorated She colitis today with the low-dose furosemide for now. Salt restriction, elevation of her legs, and support his been recommended.  Patient Instructions: 1)  Your physician recommends that you schedule a follow-up appointment in: 6 months 2)  Your physician recommends that you continue on your current medications as directed. Please refer to the Current Medication list given to you today.

## 2010-08-11 NOTE — Medication Information (Signed)
Summary: ccr-lr  Anticoagulant Therapy  Managed by: Vashti Hey, RN PCP: [name] Supervising MD: Diona Browner MD, Remi Deter Indication 1: Atrial Fibrillation (ICD-427.31) Lab Used: [facility] [place] Site: [place] INR POC 3.1  Dietary changes: no    Health status changes: no    Bleeding/hemorrhagic complications: no    Recent/future hospitalizations: no    Any changes in medication regimen? no    Recent/future dental: no  Any missed doses?: no       Is patient compliant with meds? yes       Allergies: No Known Drug Allergies  Anticoagulation Management History:      The patient is taking warfarin and comes in today for a routine follow up visit.  Positive risk factors for bleeding include an age of 75 years or older.  The bleeding index is 'intermediate risk'.  Positive CHADS2 values include Age > 39 years old.  The start date was 03/08/1998.  Anticoagulation responsible provider: Diona Browner MD, Remi Deter.  INR POC: 3.1.  Cuvette Lot#: [id number].  Exp: 10/11.    Anticoagulation Management Assessment/Plan:      The patient's current anticoagulation dose is Coumadin 5 mg tabs: Take 1/2 tablet by mouth once a day.  The target INR is 2 - 3.  The next INR is due 08/15/2010.  Anticoagulation instructions were given to patient.  Results were reviewed/authorized by Vashti Hey, RN.  She was notified by Vashti Hey RN.         Prior Anticoagulation Instructions: INR 4.1 Hold coumadin tonight then decrease dose to 1 tablet once daily except 1/2 tablets on Tuesdays and Fridays  Current Anticoagulation Instructions: INR 3.1 Decrease coumadin to 5mg  once daily except 2.5mg  on Mondays, Wednesdays and Fridays

## 2010-08-11 NOTE — Medication Information (Signed)
Summary: ccr-lr  Anticoagulant Therapy  Managed by: Vashti Hey, RN PCP: [name] Supervising MD: [name] MD, [name] Indication 1: Atrial Fibrillation (ICD-427.31) Lab Used: [facility] [place] Site: [place] INR POC 2.5  Dietary changes: no    Health status changes: no    Bleeding/hemorrhagic complications: no    Recent/future hospitalizations: no    Any changes in medication regimen? no    Recent/future dental: no  Any missed doses?: no       Is patient compliant with meds? yes       Allergies: No Known Drug Allergies  Anticoagulation Management History:      The patient is taking warfarin and comes in today for a routine follow up visit.  Positive risk factors for bleeding include an age of 75 years or older.  The bleeding index is 'intermediate risk'.  Negative CHADS2 values include Age > 75 years old.  The start date was 03/08/1998.  Anticoagulation responsible provider: [name] MD, [name].  INR POC: 2.5.  Cuvette Lot#: [id number].  Exp: 10/11.    Anticoagulation Management Assessment/Plan:      The patient's current anticoagulation dose is Coumadin 5 mg tabs: Take 1/2 tablet by mouth once a day.  The target INR is 2 - 3.  The next INR is due 01/13/2010.  Anticoagulation instructions were given to patient.  Results were reviewed/authorized by Vashti Hey, RN.  She was notified by Vashti Hey RN.         Prior Anticoagulation Instructions: INR 2.6 Continue coumadin 5mg  once daily except 2.5mg  on Tuesdays  Current Anticoagulation Instructions: INR 2.5 Continue coumadin 5mg  once daily except 2.5mg  on Tuesdays

## 2010-08-11 NOTE — Medication Information (Signed)
Summary: ccr-lr  Anticoagulant Therapy  Managed by: Vashti Hey, RN PCP: [name] Supervising MD: Diona Browner MD, Remi Deter Indication 1: Atrial Fibrillation (ICD-427.31) Lab Used: [facility] [place] Site: [place] INR POC 2.1  Dietary changes: no    Health status changes: no    Bleeding/hemorrhagic complications: no    Recent/future hospitalizations: no    Any changes in medication regimen? no    Recent/future dental: no  Any missed doses?: no       Is patient compliant with meds? yes       Allergies: No Known Drug Allergies  Anticoagulation Management History:      The patient is taking warfarin and comes in today for a routine follow up visit.  Positive risk factors for bleeding include an age of 75 years or older.  The bleeding index is 'intermediate risk'.  Negative CHADS2 values include Age > 66 years old.  The start date was 03/08/1998.  Anticoagulation responsible provider: Diona Browner MD, Remi Deter.  INR POC: 2.1.  Cuvette Lot#: [id number].  Exp: 10/11.    Anticoagulation Management Assessment/Plan:      The patient's current anticoagulation dose is Coumadin 5 mg tabs: Take 1/2 tablet by mouth once a day.  The target INR is 2 - 3.  The next INR is due 02/10/2010.  Anticoagulation instructions were given to patient.  Results were reviewed/authorized by Vashti Hey, RN.  She was notified by Vashti Hey RN.         Prior Anticoagulation Instructions: INR 2.5 Continue coumadin 5mg  once daily except 2.5mg  on Tuesdays  Current Anticoagulation Instructions: INR 2.1 Continue coumadin 5mg  once daily except 2.5mg  on Tuesdays

## 2010-08-11 NOTE — Medication Information (Signed)
Summary: ccr-lr  Anticoagulant Therapy  Managed by: Marissa Hey, RN PCP: [name] Supervising MD: [name] MD, [name] Indication 1: Atrial Fibrillation (ICD-427.31) Lab Used: [facility] [place] Site: [place] INR POC 2.4  Dietary changes: no    Health status changes: no    Bleeding/hemorrhagic complications: no    Recent/future hospitalizations: no    Any changes in medication regimen? yes       Details: started on plaquenil 200mg  bid for arthritis  Recent/future dental: no  Any missed doses?: no       Is patient compliant with meds? yes       Allergies: No Known Drug Allergies  Anticoagulation Management History:      The patient is taking warfarin and comes in today for a routine follow up visit.  Positive risk factors for bleeding include an age of 75 years or older.  The bleeding index is 'intermediate risk'.  Negative CHADS2 values include Age > 75 years old.  The start date was 03/08/1998.  Anticoagulation responsible provider: [name] MD, [name].  INR POC: 2.4.  Cuvette Lot#: [id number].  Exp: 10/11.    Anticoagulation Management Assessment/Plan:      The patient's current anticoagulation dose is Coumadin 5 mg tabs: Take 1/2 tablet by mouth once a day.  The target INR is 2 - 3.  The next INR is due 10/20/2009.  Anticoagulation instructions were given to patient.  Results were reviewed/authorized by Marissa Hey, RN.  She was notified by Marissa Hey RN.         Prior Anticoagulation Instructions: INR 2.9 Continue coumadin 5mg  once daily except 2.5mg  on Tuesdays  Current Anticoagulation Instructions: INR 2.4 Continue coumadin 5mg  once daily except 2.5mg  on Tuesdays

## 2010-08-11 NOTE — Medication Information (Signed)
Summary: ccr-lr  Anticoagulant Therapy  Managed by: Vashti Hey, RN PCP: [name] Supervising MD: [name] MD, [name] Indication 1: Atrial Fibrillation (ICD-427.31) Lab Used: [facility]  Site: [place] INR POC 2.6  Dietary changes: no    Health status changes: no    Bleeding/hemorrhagic complications: no    Recent/future hospitalizations: no    Any changes in medication regimen? no    Recent/future dental: no  Any missed doses?: no       Is patient compliant with meds? yes       Allergies: No Known Drug Allergies  Anticoagulation Management History:      The patient is taking warfarin and comes in today for a routine follow up visit.  Positive risk factors for bleeding include an age of 42 years or older.  The bleeding index is 'intermediate risk'.  Negative CHADS2 values include Age > 10 years old.  The start date was 03/08/1998.  Anticoagulation responsible provider: [name] MD, [name].  INR POC: 2.6.  Cuvette Lot#: [id number].  Exp: 10/11.    Anticoagulation Management Assessment/Plan:      The patient's current anticoagulation dose is Coumadin 5 mg tabs: Take 1/2 tablet by mouth once a day.  The target INR is 2 - 3.  The next INR is due 12/16/2009.  Anticoagulation instructions were given to patient.  Results were reviewed/authorized by Vashti Hey, RN.  She was notified by Vashti Hey RN.         Prior Anticoagulation Instructions: INR 2.6 Continue coumadin 5mg  once daily except 2.5mg  on Tuesdays  Current Anticoagulation Instructions: Same as Prior Instructions.

## 2010-08-17 NOTE — Progress Notes (Signed)
Summary: off coumadin for back injections  Phone Note Call from Patient   Caller: Patient Reason for Call: Talk to Nurse Summary of Call: Needs to cancel INR appt on 2.6.12.  Dr Bettina Gavia has scheduled pt for 3 back injections starting 08/24/10.  He told her to stop her coumadin on 08/18/10.  She will call to rescheduled INR appt after injection if he puts her back on coumadin between injections. Initial call taken by: Vashti Hey RN,  August 10, 2010 10:32 AM

## 2010-08-31 NOTE — Letter (Signed)
Summary: [facility]   [facility]   Imported By: Marylou Mccoy 08/24/2010 14:50:11  _____________________________________________________________________  External Attachment:    Type:   Image     Comment:   External Document

## 2010-08-31 NOTE — Letter (Signed)
Summary: [facility]   [facility]   Imported By: Marylou Mccoy 08/24/2010 15:04:28  _____________________________________________________________________  External Attachment:    Type:   Image     Comment:   External Document

## 2010-08-31 NOTE — Letter (Signed)
Summary: [facility]  [facility]   Imported By: Marylou Mccoy 08/24/2010 14:59:56  _____________________________________________________________________  External Attachment:    Type:   Image     Comment:   External Document

## 2010-08-31 NOTE — Letter (Signed)
Summary: [facility]  [facility]   Imported By: Marylou Mccoy 08/24/2010 14:55:54  _____________________________________________________________________  External Attachment:    Type:   Image     Comment:   External Document

## 2010-08-31 NOTE — Letter (Signed)
Summary: [facility]   [facility]   Imported By: Marylou Mccoy 08/24/2010 15:03:35  _____________________________________________________________________  External Attachment:    Type:   Image     Comment:   External Document

## 2010-08-31 NOTE — Letter (Signed)
Summary: [facility]   [facility]   Imported By: Marylou Mccoy 08/24/2010 15:05:26  _____________________________________________________________________  External Attachment:    Type:   Image     Comment:   External Document

## 2010-09-01 ENCOUNTER — Ambulatory Visit (HOSPITAL_COMMUNITY)
Admission: RE | Admit: 2010-09-01 | Discharge: 2010-09-01 | Disposition: A | Discharge status: Home / Self Care | Payer: Medicare Other | Source: Ambulatory Visit | Attending: Internal Medicine | Admitting: Internal Medicine

## 2010-09-01 DIAGNOSIS — Z139 Encounter for screening, unspecified: Secondary | ICD-10-CM

## 2010-09-01 DIAGNOSIS — Z1231 Encounter for screening mammogram for malignant neoplasm of breast: Secondary | ICD-10-CM | POA: Insufficient documentation

## 2010-09-21 ENCOUNTER — Encounter: Payer: Self-pay | Admitting: Pharmacist

## 2010-09-26 ENCOUNTER — Telehealth: Payer: Self-pay | Admitting: *Deleted

## 2010-09-27 NOTE — Letter (Signed)
Summary: Custom - Delinquent Coumadin 1  [facility]  [address], [place] [address]   Phone: [phone]  Fax: [phone]     September 21, 2010 MRN: [id number]   [facility] [address] [place], [place]  [address]   Dear Ms. Meschke,  This letter is being sent to you as a reminder that it is necessary for you to get your INR/PT checked regularly so that we can optimize your care.  Our records indicate that you were scheduled to have a test done recently.  As of today, we have not received the results of this test.  It is very important that you have your INR checked.  Please call our office at the number listed above to schedule an appointment at your earliest convenience.    If you have recently had your protime checked or have discontinued this medication, please contact our office at the above phone number to clarify this issue.  Thank you for this prompt attention to this important health care matter.  Sincerely, Vashti Hey RN  [facility] Team

## 2010-10-06 NOTE — Progress Notes (Signed)
Summary: off coumadin  Phone Note Call from Patient   Caller: Patient Reason for Call: Talk to Nurse Summary of Call: Has been off coumadin for several back injections.  Had last injection 09/21/10 and was told to restart coumadin last night.  Pt called about coumadin schedule and next INR appt.  Pt will take coumadin 5mg  tonight then resume 5mg  once daily except 2.5mg  on M,W,F and recheck INR on 10/10/10. Initial call taken by: Vashti Hey RN,  September 26, 2010 3:42 PM

## 2010-10-07 ENCOUNTER — Encounter: Payer: Self-pay | Admitting: Cardiology

## 2010-10-07 DIAGNOSIS — I4891 Unspecified atrial fibrillation: Secondary | ICD-10-CM

## 2010-10-07 DIAGNOSIS — Z7901 Long term (current) use of anticoagulants: Secondary | ICD-10-CM | POA: Insufficient documentation

## 2010-10-10 ENCOUNTER — Ambulatory Visit (INDEPENDENT_AMBULATORY_CARE_PROVIDER_SITE_OTHER): Payer: Medicare Other | Admitting: *Deleted

## 2010-10-10 DIAGNOSIS — Z7901 Long term (current) use of anticoagulants: Secondary | ICD-10-CM

## 2010-10-10 DIAGNOSIS — I4891 Unspecified atrial fibrillation: Secondary | ICD-10-CM

## 2010-10-18 LAB — BASIC METABOLIC PANEL
BUN: 18 mg/dL (ref 6–23)
Chloride: 105 mEq/L (ref 96–112)
Glucose, Bld: 102 mg/dL — ABNORMAL HIGH (ref 70–99)
Potassium: 3.5 mEq/L (ref 3.5–5.1)

## 2010-10-20 LAB — CEA: CEA: 0.8 ng/mL (ref 0.0–5.0)

## 2010-10-20 LAB — CBC
HCT: 38.1 % (ref 36.0–46.0)
HCT: 39.1 % (ref 36.0–46.0)
Hemoglobin: 13.2 g/dL (ref 12.0–15.0)
MCHC: 34.5 g/dL (ref 30.0–36.0)
Platelets: 238 10*3/uL (ref 150–400)
RBC: 4.26 MIL/uL (ref 3.87–5.11)
RDW: 14.6 % (ref 11.5–15.5)
WBC: 10.5 10*3/uL (ref 4.0–10.5)

## 2010-10-20 LAB — BASIC METABOLIC PANEL
BUN: 18 mg/dL (ref 6–23)
CO2: 31 mEq/L (ref 19–32)
Calcium: 9.3 mg/dL (ref 8.4–10.5)
Chloride: 103 mEq/L (ref 96–112)
Creatinine, Ser: 1.09 mg/dL (ref 0.4–1.2)
GFR calc Af Amer: 60 mL/min — ABNORMAL LOW (ref >60)
GFR calc non Af Amer: 49 mL/min — ABNORMAL LOW (ref >60)
Glucose, Bld: 163 mg/dL — ABNORMAL HIGH (ref 70–99)
Potassium: 4.5 mEq/L (ref 3.5–5.1)
Sodium: 142 mEq/L (ref 135–145)

## 2010-10-20 LAB — DIFFERENTIAL
Basophils Absolute: 0 10*3/uL (ref 0.0–0.1)
Basophils Relative: 0 % (ref 0–1)
Eosinophils Absolute: 0.1 10*3/uL (ref 0.0–0.7)
Eosinophils Relative: 1 % (ref 0–5)
Lymphocytes Relative: 12 % (ref 12–46)
Lymphs Abs: 1.2 10*3/uL (ref 0.7–4.0)
Monocytes Absolute: 0.5 10*3/uL (ref 0.1–1.0)
Monocytes Relative: 5 % (ref 3–12)
Neutro Abs: 8.7 10*3/uL — ABNORMAL HIGH (ref 1.7–7.7)
Neutrophils Relative %: 83 % — ABNORMAL HIGH (ref 43–77)

## 2010-10-20 LAB — URINE MICROSCOPIC-ADD ON

## 2010-10-20 LAB — COMPREHENSIVE METABOLIC PANEL
ALT: 18 U/L (ref 0–35)
AST: 23 U/L (ref 0–37)
Albumin: 3.5 g/dL (ref 3.5–5.2)
Alkaline Phosphatase: 47 U/L (ref 39–117)
Calcium: 9.7 mg/dL (ref 8.4–10.5)
GFR calc Af Amer: >60 mL/min (ref >60)
Glucose, Bld: 109 mg/dL — ABNORMAL HIGH (ref 70–99)
Potassium: 3.9 mEq/L (ref 3.5–5.1)
Sodium: 143 mEq/L (ref 135–145)
Total Protein: 6.9 g/dL (ref 6.0–8.3)

## 2010-10-20 LAB — PROTIME-INR
INR: 1.1 (ref 0.00–1.49)
INR: 1.2 (ref 0.00–1.49)
INR: 1.4 (ref 0.00–1.49)
Prothrombin Time: 14.3 seconds (ref 11.6–15.2)
Prothrombin Time: 15.2 seconds (ref 11.6–15.2)
Prothrombin Time: 18.1 seconds — ABNORMAL HIGH (ref 11.6–15.2)

## 2010-10-20 LAB — URINALYSIS, ROUTINE W REFLEX MICROSCOPIC
Bilirubin Urine: NEGATIVE
Glucose, UA: NEGATIVE mg/dL
Ketones, ur: NEGATIVE mg/dL
Nitrite: NEGATIVE
Specific Gravity, Urine: 1.006 (ref 1.005–1.030)
pH: 7.5 (ref 5.0–8.0)

## 2010-10-27 ENCOUNTER — Ambulatory Visit (INDEPENDENT_AMBULATORY_CARE_PROVIDER_SITE_OTHER): Payer: Medicare Other | Admitting: *Deleted

## 2010-10-27 DIAGNOSIS — I4891 Unspecified atrial fibrillation: Secondary | ICD-10-CM

## 2010-10-27 DIAGNOSIS — Z7901 Long term (current) use of anticoagulants: Secondary | ICD-10-CM

## 2010-11-09 ENCOUNTER — Ambulatory Visit (INDEPENDENT_AMBULATORY_CARE_PROVIDER_SITE_OTHER): Payer: Medicare Other | Admitting: Adult Health

## 2010-11-09 ENCOUNTER — Encounter: Payer: Self-pay | Admitting: Adult Health

## 2010-11-09 DIAGNOSIS — I10 Essential (primary) hypertension: Secondary | ICD-10-CM

## 2010-11-09 DIAGNOSIS — Z7901 Long term (current) use of anticoagulants: Secondary | ICD-10-CM

## 2010-11-09 DIAGNOSIS — N343 Urethral syndrome, unspecified: Secondary | ICD-10-CM

## 2010-11-09 DIAGNOSIS — R300 Dysuria: Secondary | ICD-10-CM

## 2010-11-09 DIAGNOSIS — R609 Edema, unspecified: Secondary | ICD-10-CM

## 2010-11-09 DIAGNOSIS — I4891 Unspecified atrial fibrillation: Secondary | ICD-10-CM

## 2010-11-09 LAB — BASIC METABOLIC PANEL
CO2: 29 mEq/L (ref 19–32)
Calcium: 10.3 mg/dL (ref 8.4–10.5)
Creat: 1.04 mg/dL (ref 0.40–1.20)
Glucose, Bld: 81 mg/dL (ref 70–99)
Sodium: 146 mEq/L — ABNORMAL HIGH (ref 135–145)

## 2010-11-09 LAB — CBC WITH DIFFERENTIAL/PLATELET
Basophils Absolute: 0.1 10*3/uL (ref 0.0–0.1)
Basophils Relative: 1 % (ref 0–1)
HCT: 42.2 % (ref 36.0–46.0)
Lymphocytes Relative: 21 % (ref 12–46)
MCHC: 32.7 g/dL (ref 30.0–36.0)
Neutro Abs: 6.8 10*3/uL (ref 1.7–7.7)
Neutrophils Relative %: 65 % (ref 43–77)
RDW: 14.8 % (ref 11.5–15.5)
WBC: 10.5 10*3/uL (ref 4.0–10.5)

## 2010-11-09 NOTE — Patient Instructions (Signed)
**Note De-Identified Marissa [name] Obfuscation** Your physician recommends that you return for lab work in: today  Your physician recommends that you continue on your current medications as directed. Please refer to the Current Medication list given to you today.   Your physician recommends that you schedule a follow-up appointment in: 3 months  Your physician recommends that you weigh, daily, at the same time every day, and in the same amount of clothing. Please record your daily weights on the handout provided and bring it to your next appointment. >>>Please take LASIX 20mg  Daily

## 2010-11-09 NOTE — Assessment & Plan Note (Signed)
Will have BMET drawn to evaluate kidney fx and CBC to evaluate for anemia as she is on coumadin. Although she denies bleeding or melena.  I have advised her to take the full dose of lasix as directed.  She is to avoid salt.  Will follow this and make recommendations after labs are available.

## 2010-11-09 NOTE — Progress Notes (Signed)
HPI: [name] a 77 y/o CF of Dr. Juanito Doom we are following for atrial fibrillation, s/p ablation with chronic LEE, chronic back pain with complaints of increased LEE today. She has not been seen by Dr. Daleen Squibb since August of 2011.  At that time she was doing well.  She continues in coumadin clinic in [place] office. She is on Lasix 10mg  daily, but is not taking it as directed.  Sometimes only taking 10 mg a day.  She denies chest pain, DOE, or dizziness.    Not on File  Current Outpatient Prescriptions  Medication Sig Dispense Refill  . Calcium-Vitamin D ([employer] CALCIUM-600/VIT D PO) Take 1 tablet by mouth daily.        . digoxin (LANOXIN) 0.125 MG tablet Take 125 mcg by mouth daily.        . furosemide (LASIX) 20 MG tablet Take 20 mg by mouth daily. Take 1/2 tab daily      . losartan-hydrochlorothiazide (HYZAAR) 50-12.5 MG per tablet Take 1 tablet by mouth daily.        . metoprolol (TOPROL-XL) 50 MG 24 hr tablet Take 50 mg by mouth daily. Take 1/2 tab bid        . warfarin (COUMADIN) 5 MG tablet Take by mouth as directed.          Past Medical History  Diagnosis Date  . Edema   . Atrial fibrillation   . Neoplasm     unspecifide nature digestive system    Past Surgical History  Procedure Date  . Appendectomy   . Abdominal hysterectomy     ROS: Review of systems complete and found to be negative unless listed above PHYSICAL EXAM BP 147/71  Pulse 76  Ht 5\' 2"  (1.575 m)  Wt 188 lb (85.276 kg)  BMI 34.39 kg/m2  SpO2 96% General: Well developed, well nourished, in no acute distress Head: Eyes PERRLA, No xanthomas.   Normal cephalic and atramatic  Lungs: Clear bilaterally to auscultation and percussion. Heart:  Irregular rhythm without MRG.  Pulses are 2+ & equal.            No carotid bruit. No JVD.  No abdominal bruits. No femoral bruits. Abdomen: Bowel sounds are positive, abdomen soft and non-tender without masses or                  Hernia's noted. Msk:  Back normal,  normal gait. Normal strength and tone for age. Extremities: No clubbing, cyanosis with 1+ - 2+ pitting edema. Psych:  Good affect, responds appropriately

## 2010-11-09 NOTE — Assessment & Plan Note (Signed)
Rate is well controlled. She will continue current medication.

## 2010-11-09 NOTE — Assessment & Plan Note (Signed)
Will have urinalysis completed to evaluate for UTI.

## 2010-11-10 ENCOUNTER — Other Ambulatory Visit: Payer: Self-pay | Admitting: Cardiology

## 2010-11-10 LAB — URINALYSIS
Bilirubin Urine: NEGATIVE
Glucose, UA: NEGATIVE mg/dL
Protein, ur: NEGATIVE mg/dL
Urobilinogen, UA: 0.2 mg/dL (ref 0.0–1.0)

## 2010-11-10 LAB — BRAIN NATRIURETIC PEPTIDE: Brain Natriuretic Peptide: 114.9 pg/mL — ABNORMAL HIGH (ref 0.0–100.0)

## 2010-11-11 ENCOUNTER — Telehealth: Payer: Self-pay

## 2010-11-11 NOTE — Telephone Encounter (Signed)
**Note De-Identified Orva Riles Obfuscation** Message copied by Waynette Buttery on Fri Nov 11, 2010  9:47 AM ------      Message from: Joni Reining      Created: Thu Nov 10, 2010  4:22 PM       Labs are basically ok. She does not have a UTI.  Keep taking lasix 20mg  daily (she is taking it 10mg  BID) BNP up a little.            Joni Reining Np

## 2010-11-15 ENCOUNTER — Other Ambulatory Visit: Payer: Self-pay

## 2010-11-15 MED ORDER — WARFARIN SODIUM 5 MG PO TABS: 5.0000 mg | ORAL_TABLET | Freq: Every day | ORAL | Status: DC

## 2010-11-17 ENCOUNTER — Ambulatory Visit (INDEPENDENT_AMBULATORY_CARE_PROVIDER_SITE_OTHER): Payer: Medicare Other | Admitting: *Deleted

## 2010-11-17 DIAGNOSIS — Z7901 Long term (current) use of anticoagulants: Secondary | ICD-10-CM

## 2010-11-17 DIAGNOSIS — I4891 Unspecified atrial fibrillation: Secondary | ICD-10-CM

## 2010-11-17 LAB — POCT INR: INR: 2.4

## 2010-11-22 NOTE — Assessment & Plan Note (Signed)
[facility]                       [place] [facility] OFFICE NOTE   Marissa, [name]                       MRN:          [id number]  DATE:02/26/2008                            DOB:          01/02/1933    Marissa [name] returns today for further management of her chronic atrial  fibrillation and lower extremity edema.   She has maintained a stable weight despite being on prednisone 10 mg a  day.  She apparently has some thickening in her stomach lining and is  going to have an endoscopy next week.  She will need to come off her  Coumadin 4 days before.  She knows to follow with [place] immediately  afterwards for a proton check.   She denies any orthopnea, PND, tachy palpitations, presyncope or  syncope.   MEDICATION:  1. Fosamax 70 mg every week.  2. Lanoxin 0.125 mg p.o. daily.  3. Toprol-XL 25 mg a day.  4. Gabapentin 300 mg a day.  5. Coumadin 5 mg as directed.  6. Hyzaar 50/12.5 daily.  7. Folic acid 1 mg a day.  8. Vitamin D daily.  9. Furosemide 20 mg a day.   Her chemistry in April was normal on her current regimen.   PHYSICAL EXAMINATION:  VITAL SIGNS:  Her blood pressure today is 140/72.  Her pulse is 70 and irregular.  HEENT:  Unchanged.  NECK:  Carotid upstrokes were equal bilaterally without bruits.  No JVD.  Thyroid is not enlarged.  Trachea is midline.  LUNGS:  Clear.  HEART:  An irregular rate and rhythm.  No gallop, rub or murmur.  ABDOMEN:  Soft.  Good bowel sounds.  No midline bruits.  EXTREMITIES:  No cyanosis or clubbing .  There is trace edema.  Pulses  are intact.  NEURO:  Intact.   Marissa [name] is doing well.  I have asked her to continue with her current  medical program.  We will get her back on Coumadin as soon as her  endoscopy is over.  Hopefully, this will turn out well.   I will see her back in 6 months.     Thomas C. Daleen Squibb, MD, [facility]  Electronically Signed    TCW/MedQ  DD: 02/26/2008  DT: 02/27/2008  Job  #: [id number]   cc:   Kingsley Callander. [name], MD

## 2010-11-22 NOTE — Op Note (Signed)
[name], Marissa                ACCOUNT NO.:  [id number]   MEDICAL RECORD NO.:  [id number]          PATIENT TYPE:  INP   LOCATION:  3707                         FACILITY:  [facility]   PHYSICIAN:  Velora Heckler, MD      DATE OF BIRTH:  Aug 19, 1932   DATE OF PROCEDURE:  09/24/2008  DATE OF DISCHARGE:                               OPERATIVE REPORT   PREOPERATIVE DIAGNOSES:  1. Abnormal gastric wall, rule out lymphoma.  2. Incarcerated umbilical hernia.   POSTOPERATIVE DIAGNOSES:  1. Abnormal gastric wall, rule out lymphoma.  2. Incarcerated umbilical hernia.   PROCEDURES:  1. Excisional biopsy proximal anterior gastric wall (3 x 3 cm).  2. Repair of incarcerated umbilical hernia.   SURGEON:  Velora Heckler, MD, FACS   ANESTHESIA:  General per Maren Beach, MD   ESTIMATED BLOOD LOSS:  Minimal.   PREPARATION:  Betadine.   COMPLICATIONS:  None.   INDICATIONS:  The patient is a 77 year old white female from [place],  [place].  The patient had undergone workup for an abnormally  thickened proximal gastric wall seen on CT scan.  This included upper  endoscopy with needle biopsies and endoscopic ultrasound.  Concern was  raised over possible gastric malignancy including lymphoma versus  linitis plastica.  The patient was referred to General Surgery for  consideration for gastric biopsy and repair of a longstanding  incarcerated umbilical hernia.   BODY OF REPORT:  Procedure was done in OR #10 at the [facility].  The patient was brought to the operating room,  placed in a supine position on the operating room table.  Following  administration of general anesthesia, the patient was prepped and draped  in usual strict aseptic fashion.  After ascertaining an adequate level  of anesthesia had been obtained, a upper midline abdominal incision was  made with a #15 blade.  Dissection was carried down through the  subcutaneous tissues.  Incision was  extended to the level of the  umbilicus.  A hernia sac at the level of the umbilicus in the  subcutaneous tissues was dissected out and reduced back within the  peritoneal cavity.  Hernia sac was excised and discarded.  Fascial  defect was widely opened and the fascial edges were debrided with the  electrocautery used for hemostasis.  Fascia was opened to the xiphoid  process.  Peritoneal cavity was then opened and explored.  Liver was  grossly normal.  Adhesions exist to the previous right paramedian  surgical wound.  Adhesions around the umbilicus were taken down allowing  for a good fascial plane for closure.  Balfour retractor was placed for  exposure.  Abdomen was explored.  Stomach appears grossly normal.  Palpation shows no significant thickening of the gastric wall.  Visual  inspection shows no gross abnormality.  Liver appears normal although  likely with some fatty infiltration.  No palpable masses in the liver.  Spleen is normal size without mass.  The nasogastric tube was properly  positioned within the stomach.  The entire stomach was palpated from the  esophageal hiatus to the pylorus.  No mass lesions were identified.  A  Babcock clamp was used to grasp the anterior proximal gastric wall.  A  TA-60 stapler was then placed across the anterior gastric wall elevating  some of the anterior wall through the stapler and the stapler closed and  fired.  Using a #15 blade, the gastric biopsy was excised.  It measured  approximately 3 x 3 cm in diameter.  It was submitted to pathology for  review.  Staple line was oversewn with a running #1 Vicryl suture.  Good  hemostasis was noted.   Omentum was used to cover the anterior stomach.  Midline abdominal  incision was then closed with interrupted #1 Novofil sutures.  Subcutaneous tissues were irrigated and hemostasis obtained with the  electrocautery.  Subcutaneous tissues were closed with interrupted 2-0  Vicryl sutures.  Skin was  closed with a running 4-0 Monocryl  subcuticular suture.  Wound was washed and dried and Steri-Strips were  applied.  Sterile dressings were applied.  The patient was awakened from  anesthesia and brought to the recovery room in stable condition.  The  patient tolerated the procedure well.      Velora Heckler, MD  Electronically Signed     TMG/MEDQ  D:  09/24/2008  T:  09/25/2008  Job:  [id number]   cc:   R. Roetta Sessions, M.D.  Rachael Fee, MD  Kingsley Callander. [name], MD  Eliberto Ivory Rosalio Macadamia, M.D.  Thomas C. Daleen Squibb, MD, Cathlean Cower, M.D.

## 2010-11-22 NOTE — Consult Note (Signed)
Marissa [name], Marissa [name]                ACCOUNT NO.:  [id number]   MEDICAL RECORD NO.:  [id number]          PATIENT TYPE:  AMB   LOCATION:  DAY                           FACILITY:  [facility]   PHYSICIAN:  R. Roetta Sessions, M.D. DATE OF BIRTH:  Sep 03, 1932   DATE OF CONSULTATION:  02/13/2008  DATE OF DISCHARGE:                                 CONSULTATION   REASON FOR CONSULTATION:  Gastric wall thickening on CT.   REQUESTING PHYSICIAN:  Lucrezia Starch. Earlene Plater, MD   HISTORY OF PRESENT ILLNESS:  The patient is very pleasant 77 year old  Caucasian female who presents today as requested by Dr. Earlene Plater for  further evaluation of abnormal CT.  She had a CT done recently for  history of hematuria.  She has a history of endometrial cancer, status  post hysterectomy in 2000 as well.  On the CT, she was found to have  proximal gastric wall thickening which was concerning for possibility of  a neoplasm, and direct visualization with endoscopy was recommended.  She also had a malrotated right kidney with a tiny calculus and several  small right renal cysts.  She had 2 small liver cysts and sigmoid  diverticula without any evidence of diverticulitis.   The patient states that she has felt well.  She denies any abdominal  pain.  She has occasional heartburn related to certain foods.  Denies  any dysphagia, odynophagia, nausea, vomiting, constipation, diarrhea,  melena, or rectal bleeding.   CURRENT MEDICATIONS:  1. Lanoxin 0.125 mg daily.  2. Folic acid 1 mg daily.  3. Coumadin 5 mg as directed.  4. Prednisone 15 mg daily.  5. Toprol-XL 25 mg b.i.d.  6. Hyzaar 50/12.5 mg daily.  7. Lasix 20 mg daily.  8. Gabapentin 300 mg daily.  9. Vitamin D daily.  10.Fosamax weekly.   ALLERGIES:  PENICILLIN causes rash, and CODEINE causes somnolence.   PAST MEDICAL HISTORY:  1. Hypertension.  2. Atrial fibrillation.  3. History of mitral valve prolapse.  4. Osteoporosis.  5. History of endometrial cancer, status  post hysterectomy in 2000.  6. Arthritis, previously on Remicade, she developed vasculitis and      then, apparently, MRSA bacteremia, according to the patient.  She      required having to go to the Wound Clinic for wound on her leg      related to this.  This all started back in fall of last year.  7. History of hematuria, followed by Dr. Earlene Plater, with negative urine      cytologies and CT as above.   She has had a prior appendectomy, back surgery for ruptured disk, right  arm surgery for lipoma, heart catheterization, and surgery on both feet.   FAMILY HISTORY:  Mother deceased at age 13, father deceased at age 79 of  stomach cancer.   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  She is a nonsmoker  and occasionally consumes alcohol.   REVIEW OF SYSTEMS:  See HPI for GI.  CONSTITUTIONAL:  Denies any  unintentional weight loss.  SKIN:  She developed a rash over the past  week, but this is improving, she saw Dr. [name] for it.  CARDIOPULMONARY:  Denies chest pain, shortness of breath, or palpitations.  GENITOURINARY:  Denies dysuria.  She has history of hematuria.   PHYSICAL EXAMINATION:  VITAL SIGNS:  Weight 190.5, height 5 feet 5  inches, temperature 99, blood pressure 138/88, and pulse 64.  GENERAL:  Pleasant, well-nourished, well-developed Caucasian female in  no acute distress.  SKIN:  Warm and dry.  No jaundice.  HEENT:  Sclerae nonicteric.  Oropharyngeal mucosa moist and pink.  No  lesions, erythema, or exudate.  NECK:  No lymphadenopathy or thyromegaly.  CHEST:  Lungs clear to auscultation.  CARDIAC:  Regular rate and rhythm.  No murmurs, rubs, or gallops  appreciated.  ABDOMEN:  Positive bowel sounds.  She has diffuse fine reticular rash  throughout her torso, both anterior and posterior.  She has an obvious  umbilical hernia which is easily reducible and nontender.  Abdomen  otherwise  nontender.  No organomegaly or masses.  No abdominal bruits or  hernias.  LOWER EXTREMITIES:  Trace to 1+ pitting edema bilaterally.   CT as above.   IMPRESSION:  Marissa [name] is a pleasant 77 year old lady who recently had  a CT for hematuria and was found to have gastric wall thickening.  She  reports that her father had stomach cancer.  She needs to have  endoscopic evaluation to further evaluate these findings.  She is  otherwise asymptomatic.  She has a history of atrial fibrillation, on  Coumadin, and a history of mitral valve prolapse.  New guidelines would  indicate that she does not need to have any subacute bacterial  endocarditis prophylaxis.  The patient is requesting that we clear this  with her cardiologist, which I will be glad to do.  I have made a call  in to Dr. Daleen Squibb to discuss her Coumadin and any need for subacute  bacterial endocarditis prophylaxis, and I am awaiting a return call from  his office.   PLAN:  EGD in the near future.  We will plan on holding her Coumadin for  4 days prior to procedure unless determined otherwise by Dr. Daleen Squibb.  Further recommendations to follow regarding that.  Please note the  patient is requesting EGD to be done on a Thursday on her day off, and  she already has an appointment next week, therefore it will be 2 weeks  before she will have her EGD done based on her decision.      Tana Coast, P.AJonathon Bellows, M.D.  Electronically Signed    LL/MEDQ  D:  02/13/2008  T:  02/14/2008  Job:  [id number]   cc:   Windy Fast L. Earlene Plater, M.D.  Fax: [phone]   Kingsley Callander. [name], MD  Fax: [phone]

## 2010-11-22 NOTE — Assessment & Plan Note (Signed)
[facility]                            CARDIOLOGY OFFICE NOTE   DEJANE, SCHEIBE                       MRN:          [id number]  DATE:10/21/2007                            DOB:          Aug 03, 1932    Ms. Mccravy returns today for further management of her lower extremity  edema.   Please see my note from September 25, 2007.   We added furosemide 20 mg a day.  She has dropped about 4 pounds of  fluid and is still taking it about every other day.  She was discharged  from the wound clinic today, and they told her that this would be  beneficial to stay on the furosemide.   PHYSICAL EXAMINATION:  VITAL SIGNS:  We noted that her blood pressure  was high last visit and her heart rate was a bit high, as well.  She  still has a blood pressure of 171/91.  Pulses is 89 and regular.  Her  weight is down to 190 from 194.  GENERAL:  She looks much more comfortable.  HEENT:  Unchanged.  NECK:  Unchanged.  LUNGS:  Clear.  HEART:  Irregular rate and rhythm.  ABDOMEN:  Soft with good bowel sounds.  EXTREMITIES:  Reveal only 1+ edema now.  Pulses were present but  reduced.  She has varicose veins.  No sign of cellulitis or  thrombophlebitis.  NEUROLOGIC:  Intact.   Ms. Sweeney seems to be pleased with the low-dose furosemide.  We  prescribed it daily and will check a Chem-7 today.  In addition, I have  increased her Toprol-XL from 25 mg a day to 50 mg a day for better rate  control and hopefully better pressure control.  I told her that Dr.  [name] may need to change her Hyzaar over to Cozaar now that she is on  daily furosemide, and also increase the dose if her pressure is under  good control.   Will plan on seeing her back in 3 months.   Please note that she states her blood pressure is fairly labile.  I have  told to keep a watch on the trend and let Dr. [name] or me know if it is  not under good control.     Thomas C. Daleen Squibb, MD, [facility]  Electronically  Signed    TCW/MedQ  DD: 10/21/2007  DT: 10/21/2007  Job #: [id number]   cc:   Kingsley Callander. [name], MD

## 2010-11-22 NOTE — Discharge Summary (Signed)
NAMEMISSI, MCMACKIN                ACCOUNT NO.:  [id number]   MEDICAL RECORD NO.:  [id number]          PATIENT TYPE:  INP   LOCATION:  3707                         FACILITY:  [facility]   PHYSICIAN:  Velora Heckler, MD      DATE OF BIRTH:  March 28, 1933   DATE OF ADMISSION:  09/24/2008  DATE OF DISCHARGE:  09/27/2008                               DISCHARGE SUMMARY   REASON FOR ADMISSION:  Rule out gastric lymphoma, incarcerated umbilical  hernia.   HISTORY OF PRESENT ILLNESS:  The patient is 77 year old white female  from [place], [place].  The patient had undergone workup which  had demonstrated an abnormal-appearing gastric wall.  This included CT  scan and endoscopy with biopsy.  Concern was raised over possible  gastric malignancy.  The patient was referred to General Surgery for  definitive tissue diagnosis.   HOSPITAL COURSE:  The patient was admitted on the General Surgical  Service and taken directly to the operating room on September 24, 2008.  The  patient underwent laparotomy with excisional biopsy of the proximal  anterior gastric wall.  She also had concurrent repair of an  incarcerated umbilical hernia.  Postoperative course was  straightforward.  Nasogastric tube was removed on the first  postoperative day.  The patient was started on a clear liquid diet.  She  was seen in consultation by Cardiology for management of her  anticoagulation.  The patient made steady progress and was prepared for  discharge home on September 27, 2008.   DISCHARGE PLAN:  The patient is discharged home September 27, 2008 in good  condition, tolerating her diet, and ambulating independently.   DISCHARGE MEDICATIONS:  Darvocet as needed for pain and Coumadin as  directed to be followed by [facility].  The patient will be seen  back in my office at [facility] for wound check.   DISCHARGE DIAGNOSES:  1. Rule out gastric malignancy, status post open biopsy.  2. Repair of  incarcerated umbilical hernia.   CONDITION ON DISCHARGE:  Improved.      Velora Heckler, MD  Electronically Signed     TMG/MEDQ  D:  10/26/2008  T:  10/27/2008  Job:  [id number]

## 2010-11-22 NOTE — Assessment & Plan Note (Signed)
[facility]                            CARDIOLOGY OFFICE NOTE   Marissa [name], Marissa [name]                       MRN:          [id number]  DATE:09/25/2007                            DOB:          08/21/1932    HISTORY OF PRESENT ILLNESS:  Marissa [name] returns today.  Since I saw her  last, January 2008, she has had a torrential year.  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  She had been treated  with several courses of antibiotics and followed by Marissa [name] and is  now receiving care at the wound center at [facility].  She has  also has been seeing Marissa [name].   Her biggest complaint today is lower extremity swelling.  She denies  orthopnea, PND or increased shortness of breath.  She really cannot get  very around very well and just gives out.   PROBLEM LIST:  1. Nonischemic cardiomyopathy.  She was originally diagnosed with this      years ago, but has substantial improvement in her systolic      function.  A repeat 2-D echocardiogram, August 03, 2006,      demonstrated normal left ventricular chamber size and overall      function.  She had no LVH.  No regional wall motion abnormalities,      moderate left and right atrial enlargement, mild right ventricular      enlargement.  Normal right ventricular function, moderate aortic      valve sclerosis with normal function, trivial mitral regurgitation,      annular calcification, mild to moderate tricuspid regurgitation      with normal estimated right ventricular systolic pressures.  2. Chronic atrial fibrillation with a well-controlled ventricular      rate.  3. Anticoagulation.  4. Hypertension.   MEDICATIONS:  1. Fosamax 70 mg per week.  2. Lanoxin 0.125 mg per day.  3. Toprol XL 25 mg a day.  4. Gabapentin 300 mg a day./  5. Coumadin as directed.  6. Hyzaar 50/12.5 daily.  7. Folic acid 1 mg a day.  8. Vitamin D.   PHYSICAL EXAMINATION:  VITAL SIGNS:  Her blood  pressure is 179/97.  She  says she usually runs much better than this.  Her pulse is 85-90 and  irregular.  Weight is 194 up 10.  HEENT:  Skin color is normal.  PERRL.  Extraocular is intact.  Sclerae  are nonicteric.  Facial symmetry is normal.  Dentition satisfactory.  NECK:  Supple.  Carotids upstrokes are equal bilaterally without bruits,  no JVD.  Thyroid is not enlarged.  Trachea is midline.  LUNGS:  Clear to auscultation and percussion.  HEART:  Reveals a soft S1-S2 with an irregular rate and rhythm.  ABDOMEN:  Obese.  Organomegaly could not be adequately assessed.  EXTREMITIES:  Reveal 3+ pitting edema bilaterally.  Pulses were intact,  2+/4+ dorsalis pedis posterior tibial.  There is no sign of cellulitis  or DVT.  She has some areas of vascular dermatitis that are bandaged  that I  did not look at the day.  NEUROLOGICAL:  Exam is intact.   ASSESSMENT:  1. Increase lower extremity with a 10 pounds weight gain.  2. Painful lower extremities probably secondary to her vasculitis as      well as the edema.  3. History of nonischemic cardiomyopathy, now with normal left      ventricular systolic function.  4. Chronic atrial fibrillation.  5. Anticoagulation.  6. Hypertension.   I had a long talk with Marissa [name] today.  I think she really would  benefit from a stronger diuretic.  Perhaps this would decrease not only  the swelling of her legs, but the pain, not to mention the risk of  cellulitis or other superficial cutaneous infections.   PLAN:  Add furosemide 20 mg p.o. q.a.m. for 7 days.  She should lose  around 5-7 pounds of weight.  She continues it p.r.n.Marland Kitchen  She will  continue her Hyzaar was low dose HCTZ.  I have made no other changes in  her medical program.   I will plan on seeing her back again in a couple weeks and see what kind  of response she has had, and whether or not she needs to be on this  chronically.  If her heart rate and blood pressure do not come down,  I  would suggest increasing her Toprol as well.   Before she left, I obtained an EKG which shows atrial fib with narrow  QRS.  No acute changes.     Thomas C. Daleen Squibb, [name], [facility]  Electronically Signed    TCW/MedQ  DD: 09/25/2007  DT: 09/26/2007  Job #: [id number]   cc:   Marissa [name]  Marissa Keas, M.D.

## 2010-11-22 NOTE — Assessment & Plan Note (Signed)
[facility]                       [place] CARDIOLOGY OFFICE NOTE   DARIELLE, HANCHER                       MRN:          [id number]  DATE:09/04/2008                            DOB:          1932/09/04    Ms. Ballow comes in today for preoperative clearance for biopsy of a  stomach mass and repair of an umbilical hernia requiring general  anesthesia by Dr. Darnell Level of [facility].  The date of  the surgery is being scheduled.   PROBLEM LIST:  1. Chronic atrial fibrillation.  She has been treated with rate      control and anticoagulation.  2. Mild reduction in left ventricular systolic function.  3. Anticoagulation.  4. Lower extremity edema.   Her last echocardiogram was in January 2008 which showed normal left  ventricular function.  She had no evidence of left ventricular  enlargement.  She has mild-to-moderate left atrial enlargement.  She had  no valvular regurgitation other than mild-to-moderate tricuspid  regurgitation with normal right-sided pressures.   She has been doing well from a cardiac standpoint.  She offers no  complaints.  Specifically, her edema has been stable.   Her medications include:  1. Fosamax 70 mg p.o. week.  2. Lanoxin 0.125 mg daily.  3. Toprol-XL 25 mg per day.  4. Gabapentin 300 mg a day.  5. Coumadin 5 mg as directed.  6. Hyzaar 50/12.5 daily.  7. Folic acid 1 mg per day.  8. Vitamin D daily.  9. Furosemide 20 mg p.o. q.a.m.   Of note, she takes her Toprol-XL and Lanoxin at night.   PHYSICAL EXAMINATION:  GENERAL:  Today, she is in no acute distress.  SKIN:  Warm and dry.  HEENT:  Normal.  VITAL SIGNS:  Respiratory rate is 18 and unlabored.  Her blood pressure  is 120/80.  Heart rate is 90 and irregular.  Her weight is 190 which is  stable.  NECK:  Supple.  Carotid upstrokes were equal bilaterally without bruits.  No JVD.  Thyroid is not enlarged.  Trachea is midline.  LUNGS:   Clear.  HEART:  An irregular rate and rhythm.  PMI is difficult to appreciate.  ABDOMEN:  Soft.  Good bowel sounds.  EXTREMITIES:  No cyanosis or clubbing, but she does have trace to 1+  edema which is chronic.  Pulses are intact.  NEUROLOGIC:  Intact.  SKIN:  Unremarkable.   Electrocardiogram shows atrial fibrillation with some nonspecific  changes.  This is stable.   ASSESSMENT AND PLAN:  Ms. Dwyer is stable from my standpoint for  surgery.  I have written a clearance at low operative risk and have  advised her to stop her Coumadin 5 days prior to the procedure.  They  will start her back on Coumadin as soon as adequate hemostasis is met.  I do not feel she needs to be on Lovenox overlap.   I will plan on seeing her back in 6 months.     Thomas C. Daleen Squibb, MD, [facility]  Electronically Signed    TCW/MedQ  DD: 09/04/2008  DT: 09/05/2008  Job #: [id number]

## 2010-11-22 NOTE — Op Note (Signed)
Marissa [name], Marissa [name]                ACCOUNT NO.:  [id number]   MEDICAL RECORD NO.:  [id number]          PATIENT TYPE:  AMB   LOCATION:  DAY                           FACILITY:  [facility]   PHYSICIAN:  R. Roetta Sessions, M.D. DATE OF BIRTH:  1933/03/23   DATE OF PROCEDURE:  03/03/2008  DATE OF DISCHARGE:                               OPERATIVE REPORT   INDICATIONS FOR PROCEDURE:  A 77 year old lady recently underwent a CT  scan in [facility] ordered by Dr. Earlene Plater for hematuria.  Dr. Gordan Payment read the films and felt there was maximal wall thickness of 1.8 cm,  and she is referred over for further evaluation, i.e. EGD.   Please note, Marissa [name] denies any abdominal pain, odynophagia,  dysphagia, early satiety, nausea, vomiting, or weight loss.  Mother  succumbed to stomach cancer in her 82s.  Her last colonoscopy done 10  years ago.  EGD is now being done to further evaluate proximal gastric  wall thickening seen on CT.  Risks, benefits, alternatives, and  limitations have been reviewed, questions answered.  She is agreeable.  Her Coumadin was stopped 5 days ago.   PROCEDURE NOTE:  O2 saturation, blood pressure, and pulse of the patient  monitored throughout the entire procedure.   CONSCIOUS SEDATION:  Versed 3 mg IV and Demerol 75 mg IV in divided  doses.   INSTRUMENT:  Pentax video chip system.   FINDINGS:  Examination of the tubular esophagus revealed no mucosal  abnormalities.  EG junction easily traversed.  Stomach:  Gastric cavity  was emptied and insufflated well with air.  Thorough examination of the  gastric mucosa including retroflexion at the proximal stomach  esophagogastric junction was undertaken.  Careful examination of the  gastric mucosa revealed very good compliance.  There was no evidence of  linitis plastica.  The mucosal surfaces of the gastric mucosa were well  seen.  I observed no mucosal abnormalities with particular attention to  the more proximal  stomach.  Please see multiple photographs.  Pylorus is  patent and easily traversed.  Examination of the bulb and the second  portion revealed no mucosal abnormalities.  Pulled the scope back up  into the stomach and reconfirmed normal-appearing gastric mucosa.  Therapeutic/diagnostic maneuvers performed, none.   The patient tolerated the procedure well and was reactive in Endoscopy.   IMPRESSION:  1. Normal esophagus.  2. Small hiatal hernia.  3. Normal-appearing gastric mucosa, patent pylorus, and normal D1 and      D2.   I was not able to pull up the images on our computer to look at the CT  scan, as it was done at [facility].  I did review the films, via  telephone, with Dr. Elie Goody.  He felt there was an element of under  distention; however, he too was concerned about markedly thickened  gastric mucosa proximally with a 1.8-cm wall thickness, which persisted  even with the delayed images.   RECOMMENDATIONS:  1. Given the degree of gastric wall thickness seen on CT, I feel that  this needs to proceed further.  However, again the mucosal surface      of the stomach appeared normal.  On today's endoscopy, we need to      evaluate this nice lady further for submucosal process.      Recommendations, we will go ahead and set her up for an endoscopic      ultrasound with Dr. Christella Hartigan down in [place] to further assess      gastric wall thickness to make further recommendations in the very      near future.  2. Separate issue.  This nice lady ought to return at some point      between now and the end of the year for screening colonoscopy issue      is due.  3. I have asked her to go ahead and resume the Coumadin today.       Jonathon Bellows, M.D.  Electronically Signed     RMR/MEDQ  D:  03/03/2008  T:  03/03/2008  Job:  [id number]   cc:   Rachael Fee, MD  [address]  [place], [place] [address]   Kingsley Callander. [name], MD  Fax: [phone]   Addison Lank, [facility]   Lucrezia Starch. Earlene Plater, M.D.  Fax: [phone]

## 2010-11-25 NOTE — H&P (Signed)
[place]  Patient:    Marissa [name], Marissa [name]                       MRN: [id number] Adm. Date:  [id number] Attending:  Barton Fanny                         History and Physical  HISTORY OF PRESENT ILLNESS:  The patient is a 77 year old right-handed white female who was evaluated for right lumbar radiculopathy. Symptoms began over two and a half months ago without any particular cause. She initially had fairly severe pain running from the right buttock into the right posterior thigh and calf, into the medial aspect of her right foot towards her right great toe. She was treated with steroid Dosepak and did improve. She continued to have discomfort in the right side of her low back, but the worse pain was through the right lower extremity. She had numbness and tingling in the right foot, worse towards the right great toe, as well as some in the anterolateral aspect of the distal right leg. She denies any weakness.  PAST MEDICAL HISTORY: 1. History of hypertension treated for the past six years. 2. History of atrial fibrillation treated with Lanoxin and Coumadin. This was    felt by Dr. Daleen Squibb, her cardiologist, to have been precipitated by a viral    carditis. He explains that her left ventricular function is good and she    has no notable atherosclerotic coronary vascular disease. She does have    mitral valve prolapse. 3. Endometrial cancer for which she underwent a hysterectomy by Dr. Gildardo Griffes. 4. She is currently followed for a fibrous lump in her medial right arm by    Dr. Lovie Chol.  There is no history of myocardial infarction, stroke, diabetes, peptic ulcer disease or lung disease.  PAST SURGICAL HISTORY: 1. Hysterectomy. 2. Appendectomy.  ALLERGIES:  CODEINE which makes her sleepy. _____ causes a rash.  CURRENT MEDICATIONS: 1. Fosamax 70 mg q.week. 2. Lanoxin 0.25 mg q.d. 3. Cozaar 50 mg q.d. 4. Coumadin, which  has been held. 5. Toprol XL 25 mg q.d.  FAMILY HISTORY:  Her mother is in fair health at age 76 and lives in a nursing home. She does have thyroid disease. Her father died at age 91 of cancer of the stomach.  SOCIAL HISTORY:  The patient is retired, but she works part time at the [employer] She is also an Production manager. She is widowed. She does not smoke. She does drink alcoholic beverages socially. Denies a history of substance abuse.  REVIEW OF SYSTEMS:  Notable for those difficulties described in her history of present illness and past medical history, but is otherwise unremarkable. We did review the review of systems sheet.  PHYSICAL EXAMINATION:  GENERAL:  The patient is a well-developed, well-nourished white female in no acute distress.  VITAL SIGNS:  Temperature 97.3, pulse 52, blood pressure 153/42, respiratory rate 18. Height 5 feet 4 inches. Weight 160 pounds.  LUNGS:  Clear to auscultation. Symmetrical respiratory excursion.  HEART:  Irregular irregular rhythm. Normal S1 and S2. There is a 2/6 systolic murmur.  ABDOMEN:  Soft and nondistended. Bowel sounds are present.  EXTREMITIES:  No clubbing, cyanosis or edema.  MUSCULOSKELETAL:  No tenderness on palpation of the lumbar spinous process or paralumbar musculature. She is able to flex to 90 degrees. She is  able to extend. Straight leg raising a bit of discomfort into the right lower extremity at about 80 to 90 degrees. Straight leg raising is negative on the left.  NEUROLOGIC:  Significant weakness of the right dorsiflexure 4 minus to 4/5 in the right extensor hallucis longus, 3/5 in the right plantar flexors, 5/5 in the corresponding musculature, and in the left lower extremity is all 5/5. Sensation is hyperesthetic to pinprick in the medial aspect of the right foot. Reflexes of the quadriceps are 1 to 2 bilaterally, the gastrocnemius is trace to 1 bilaterally. Toes are downgoing  bilaterally. She has normal gait and stance.  DIAGNOSTIC STUDIES:  Lumbosacral spine x-rays and MRI scan of the lumbar spine all from [facility] showed degenerative joint disease and spondylosis at multiple levels, most significant finding is of a large right L5-S1 extra foraminal disk herniation with significant right L5 nerve root compression. There is circumferential disk bulging which is asymmetric to the right side at the L4-5 level with right L4-5 lateral recess encroachment with compression, as well, of the right L5 nerve root.  IMPRESSION:  Acute right L5 radiculopathy with a moderately large right L5-S1 extra foraminal disk herniation. There may be some contribution, as well, to the right L4-5 lateral recess encroachment in a patient with underlying degenerative joint disease and spondylosis.  PLAN:  The patient is being admitted for right L5-S1 extra foraminal microdiskectomy and a right L4-5 lumbar laminotomy and foraminotomy and possible microdiskectomy. We discussed the case with Dr. Juanito Doom, her cardiologist, who feels that she is at relatively low risk for being off Coumadin for embolic phenomenon. He feels that she has good LV function and no atherosclerotic coronary vascular disease. He feels that she can be off her Coumadin for a sufficient period of time to undergo surgery. Plan is also for Dr. Luan Pulling to remove this fibrous lesion from the medial aspect of her right arm tomorrow.  We did discuss the nature of the lumbar surgery. The typical length of surgery, hospital stay and overall recuperation, alternatives to surgery, and risk of surgery including the risk of infection, bleeding, possible need for transfusion, risk of nerve dysfunction, pain, weakness, numbness, paresthesias, risk of recurrent disk herniation, and instability of the spine and anesthetic risk, myocardial infarction, stroke, pneumonia and death. All of which are increased  due to her being off the Coumadin for surgery. Understanding all of this though she does wish to proceed with surgery and is admitted for such. DD:  05/28/00 TD:  05/28/00  Job: [id number]

## 2010-11-25 NOTE — Procedures (Signed)
[name], Marissa [name]                ACCOUNT NO.:  [id number]   MEDICAL RECORD NO.:  [id number]          PATIENT TYPE:  OUT   LOCATION:  RAD                           FACILITY:  [facility]   PHYSICIAN:  Gerrit Friends. [name], MD, FACCDATE OF BIRTH:  1932/12/08   DATE OF PROCEDURE:  DATE OF DISCHARGE:                                ECHOCARDIOGRAM   CLINICAL DATA:  A 77 year old woman with atrial fibrillation; history of  hypertension.   M-MODE:  Aorta 2.6, left atrium 4.4, septum 1.3, posterior wall 1.1, LV  diastole 4.8, LV systole 3.8.   1. Technically adequate echocardiographic study.  2. Mild to moderate left and right atrial enlargement.  3. Right ventricular size is at the upper limit of normal; no RVH;      normal RV systolic function.  4. Mild to moderate sclerosis of a trileaflet aortic valve with normal      function.  5. Mild mitral valve thickening; mild to moderate annular      calcification; trivial regurgitation.  6. Normal pulmonic valve and proximal pulmonary artery.  7. Normal tricuspid valve; mild to moderate regurgitation; normal      estimated RV systolic pressure.  8. Normal left ventricular size; no LVH; normal regional and global      function.  9. Physiologic pericardial effusion.  10.Moderate dilatation of the IVC.      Gerrit Friends. [name], MD, [facility]  Electronically Signed     RMR/MEDQ  D:  08/03/2006  T:  08/03/2006  Job:  [id number]

## 2010-11-25 NOTE — Assessment & Plan Note (Signed)
[facility]                            CARDIOLOGY OFFICE NOTE   Marissa [name], Marissa [name]                       MRN:          [id number]  DATE:08/02/2006                            DOB:          1933-06-19    Marissa [name] returns today after a year and a half absence from the  practice.   PROBLEM LIST:  1. Nonischemic cardiomyopathy.  She had normal left ventricular      systolic function by 2D echocardiogram Nov 13, 2003.  She probably      has some diastolic dysfunction, and suffers from lower extremity      edema.  2. Chronic atrial fibrillation with a well controlled ventricular      rate.  3. Hypertension, which is difficult to measure.  4. Anticoagulation, followed in the [facility] in [place].  5. Severe arthritis, followed by Dr. Phylliss Bob.  6. History of endometrial carcinoma, cured.   She is on:  1. Fosamax 70 mg weekly.  2. Lanoxin 0.125 mg a day.  3. Toprol XL 25 mg a day.  4. Gabapentin 300 mg a day.  5. Coumadin 5 mg as directed.  6. Hyzaar 50/12.5 daily.  7. Folic acid 1 mg a day.  8. Methotrexate 2.5, two weekly.  9. Remicade every 8 weeks.   She had blood work on August 02, 2006 in [place].  We pulled it.  She has a normal comprehensive metabolic panel, normal CBC.  Her lipids  look remarkably good.  Her TSH was normal.   EXAMINATION:  Blood pressure 166/92.  Her pulse is 64 and regular.  I  rechecked her pressure in her right arm.  Took several readings and also  palpating her brachial artery with my fingers.  She probably has a  systolic around 130 to 140.  Her weight is 185, up 7.  HEENT:  Normocephalic and atraumatic.  PERRLA.  Extraocular movements  intact.  She is wearing glasses.  Funduscopic exam shows no increased  arterial narrowing or [place] nicking, humping or exudates.  Dentition is  satisfactory.  Carotid upstrokes were equal bilaterally without bruits.  No JVD.  Thyroid is not enlarged.  Trachea is midline.  LUNGS:  Clear.  HEART:  Reveals an irregular rate and rhythm.  ABDOMINAL EXAM:  Soft with good bowel sounds.  EXTREMITIES:  Reveal no cyanosis or clubbing, but there is 1+ edema.  Pulses are intact.  NEUROLOGIC EXAM:  Intact.  MUSCULOSKELETAL:  Shows severe arthritis in her hands and feet.   Her EKG shows atrial fibrillation with a heart rate of 64 beats per  minute.  She has nonspecific ST segment changes.  There has been no  change.   ASSESSMENT AND PLAN:  Marissa [name] seems to be stable from our  perspective.  We need a 2D echocardiogram to follow up on her left  ventricular function as well as her right-sided pressures.  She will  remain on Coumadin, and her current medications including Toprol and  Hyzaar.  The Lanoxin has always seemed to help her with her ventricular  rate, and we will  leave this alone.   I will plan on seeing her back in a year.     Thomas C. Daleen Squibb, MD, [facility]  Electronically Signed    TCW/MedQ  DD: 08/02/2006  DT: 08/02/2006  Job #: [id number]   cc:   Kingsley Callander. [name], MD  Chase Picket, MD

## 2010-11-25 NOTE — Op Note (Signed)
[place]  Patient:    Marissa [name], Marissa [name]                       MRN: [id number] Proc. Date: 05/29/00 Adm. Date:  [id number] Attending:  Barton Fanny                           Operative Report  PREOPERATIVE DIAGNOSIS:  Right arm lipoma.  POSTOPERATIVE DIAGNOSIS:  Right arm lipoma.  OPERATION PERFORMED:  Excision of right arm lipoma.  SURGEON:  Stephenie Acres, M.D.  ANESTHESIA:  Local MAC.  DESCRIPTION OF PROCEDURE:  The patient was taken to the operating room and placed in supine position.  After adequate anesthesia was induced using MAC technique, the right arm was prepped and draped in normal sterile fashion. Using 1% lidocaine local aneshthesia, the skin and subcutaneous tissues surrounding the mass in the right upper arm were anesthetized.  A longitudinal incision was made and dissected down onto a fatty encapsulated mass which was incised in its entirety.  Adequate hemostasis was ensured and the skin was closed with subcuticular 4-0 Monocryl.  Steri-Strips and sterile dressings were applied.  The patient tolerated the procedure well and went to PACU in good condition. DD:  05/29/00 TD:  05/29/00 Job: [id number]

## 2010-11-25 NOTE — Op Note (Signed)
[place]  Patient:    Marissa [name], Marissa [name]                       MRN: [id number] Proc. Date: 05/28/00 Adm. Date:  [id number] Attending:  Barton Fanny CC:         Hewitt Shorts, M.D.   Operative Report  PREOPERATIVE DIAGNOSIS:  Right L5-S1 extraforaminal lumbar disk herniation, right L4-5 lateral recess stenosis secondary to degenerative disk disease and spondylosis.  POSTOPERATIVE DIAGNOSIS:  Right L5-S1 extraforaminal lumbar disk herniation, right L4-5 lateral recess stenosis secondary to degenerative disk disease and spondylosis.  OPERATION PERFORMED: 1. Right L5-S1 extraforaminal microdiskectomy with microdissection with the    operating microscope. 2. Right L4-5 lumbar laminotomy and foraminotomy with microdissection with the    operating microscope.  SURGEON:  Hewitt Shorts, M.D.  ANESTHESIA:  General endotracheal.  INDICATIONS FOR PROCEDURE:  The patient is a 77 year old woman who presented with right lumbar radiculopathy.  He was found by MRI scan to have a large right L5-S1 extraforaminal disk herniation and also had significant lateral recess encroachment on the right side at L4-5 secondary to degenerative disk disease and spondylosis.  Decision was made to proceed with decompression and microdiskectomy.  DESCRIPTION OF PROCEDURE:  The patient was brought to the operating room and placed under general endotracheal anesthesia.  The patient was turned to a prone position.  The lumbar region was prepped with Betadine soap and solution and draped in a sterile fashion.  The midline was infiltrated with local anesthetic with epinephrine.  A localizing x-ray was taken.  The midline incision was made over the L4-5 and L5-S1 levels.  Dissection was carried down through the subcutaneous tissues.  Bipolar cautery and electrocautery were used to maintain hemostasis.  Dissection was carried down to the lumbar fascia which was  incised on the right side of the midline and the paraspinal muscles were dissected from the spinous process and lamina in a subperiosteal fashion. Another x-ray was taken.  The L4-5 and L5-S1 interlaminar space was identified.  We then dissected out laterally over the right L5-S1 facet complex and dissected down to the ala of the sacrum.  A self-retaining retractor was placed and using the Midas Rex drill with an AM-8 bur, a laminotomy was performed at the L4-5 level and the superior aspect of the right sacral ala and the lateral aspect of the right S1 superior facet was removed.  The microscope was draped and brought into the field to provide additional magnification, illumination and visualization.  The remainder of the procedure was performed using microdissection technique.  We dissected down into the right L5-S1 extraforaminal space.  We identified the right L5 nerve root and worked inferomedial to it.  The superolateral portion of the pedicle was removed using the Midas Rex drill and we were able to identify disk herniation directly compressing the right L5 nerve root.  This was carefully removed.  We did enter the disk space and remove additional degenerated disk material.  In the end, all loose fragments of disk material were removed from both the disk space and the extraforaminal space and good decompression of the nerve root was achieved.  At the L4-5 level, the ligamentum flavum was removed and we identified the thecal sac and right L5 nerve root.  A foraminotomy was performed for the right L5 nerve root.  The L4-5 disk was examined.  It was spondylitically degenerated but no actual disk  herniation was found.  In the end it was felt that good decompression of the thecal sac and nerve root had been achieved in the right L4-5 lateral recess. The L5 nerve root was decompressed both within the canal, within the foramen and extraforaminally.  We then established hemostasis with the use  of Gelfoam soaked in thrombin as well as bipolar cautery and once hemostasis was established, we instilled 2 cc of fentanyl and 80 mg of Depo-Medrol in the extraforaminal space as well as in the lateral recess.  We then proceeded with closure.  The deep fascia was closed with interrupted, undyed 0 Vicryl suture and the subcutaneous and subcuticular layer were closed with interrupted inverted 2-0 undyed Vicryl sutures and the skin edges were approximated with Dermabond.  The patient tolerated the procedure well.  Estimated blood loss for this procedure was 100 cc.  Sponge and instrument counts were correct. Following surgery, the patient was turned back to supine position, reversed from anesthetic, extubated and transferred to the recovery room for further care. DD:  05/28/00 TD:  05/28/00 Job: [id number]

## 2010-12-07 ENCOUNTER — Ambulatory Visit: Payer: Medicare Other | Admitting: Adult Health

## 2010-12-15 ENCOUNTER — Ambulatory Visit (INDEPENDENT_AMBULATORY_CARE_PROVIDER_SITE_OTHER): Payer: Medicare Other | Admitting: *Deleted

## 2010-12-15 DIAGNOSIS — Z7901 Long term (current) use of anticoagulants: Secondary | ICD-10-CM

## 2010-12-15 DIAGNOSIS — I4891 Unspecified atrial fibrillation: Secondary | ICD-10-CM

## 2010-12-16 ENCOUNTER — Other Ambulatory Visit (HOSPITAL_COMMUNITY): Payer: Self-pay | Admitting: Preventative Medicine

## 2010-12-16 DIAGNOSIS — R9389 Abnormal findings on diagnostic imaging of other specified body structures: Secondary | ICD-10-CM

## 2010-12-21 ENCOUNTER — Ambulatory Visit (HOSPITAL_COMMUNITY)
Admission: RE | Admit: 2010-12-21 | Discharge: 2010-12-21 | Disposition: A | Discharge status: Home / Self Care | Payer: Medicare Other | Source: Ambulatory Visit | Attending: Preventative Medicine | Admitting: Preventative Medicine

## 2010-12-21 DIAGNOSIS — R918 Other nonspecific abnormal finding of lung field: Secondary | ICD-10-CM | POA: Insufficient documentation

## 2010-12-21 DIAGNOSIS — R9389 Abnormal findings on diagnostic imaging of other specified body structures: Secondary | ICD-10-CM

## 2010-12-21 MED ORDER — IOHEXOL 300 MG/ML  SOLN
80.0000 mL | Freq: Once | INTRAMUSCULAR | Status: AC | PRN
  Administered 2010-12-21: 80 mL via INTRAVENOUS

## 2011-01-01 ENCOUNTER — Emergency Department (HOSPITAL_COMMUNITY)
Admission: EM | Admit: 2011-01-01 | Discharge: 2011-01-01 | Disposition: A | Discharge status: Home / Self Care | Payer: Medicare Other | Attending: Emergency Medicine | Admitting: Emergency Medicine

## 2011-01-01 DIAGNOSIS — I059 Rheumatic mitral valve disease, unspecified: Secondary | ICD-10-CM | POA: Insufficient documentation

## 2011-01-01 DIAGNOSIS — I4891 Unspecified atrial fibrillation: Secondary | ICD-10-CM | POA: Insufficient documentation

## 2011-01-01 DIAGNOSIS — Z7901 Long term (current) use of anticoagulants: Secondary | ICD-10-CM | POA: Insufficient documentation

## 2011-01-01 DIAGNOSIS — IMO0002 Reserved for concepts with insufficient information to code with codable children: Secondary | ICD-10-CM | POA: Insufficient documentation

## 2011-01-01 DIAGNOSIS — I83893 Varicose veins of bilateral lower extremities with other complications: Secondary | ICD-10-CM | POA: Insufficient documentation

## 2011-01-01 DIAGNOSIS — M129 Arthropathy, unspecified: Secondary | ICD-10-CM | POA: Insufficient documentation

## 2011-01-01 DIAGNOSIS — Z79899 Other long term (current) drug therapy: Secondary | ICD-10-CM | POA: Insufficient documentation

## 2011-01-01 LAB — PROTIME-INR
INR: 2.54 — ABNORMAL HIGH (ref 0.00–1.49)
Prothrombin Time: 27.8 seconds — ABNORMAL HIGH (ref 11.6–15.2)

## 2011-01-02 ENCOUNTER — Telehealth: Payer: Self-pay | Admitting: Cardiology

## 2011-01-02 NOTE — Telephone Encounter (Signed)
Patient went to ER on Sunday 01/01/11.  Her PT was 27.8 and INR was 2.54.  Please call with instructions and new appointment. / tg

## 2011-01-04 ENCOUNTER — Ambulatory Visit (INDEPENDENT_AMBULATORY_CARE_PROVIDER_SITE_OTHER): Payer: Self-pay | Admitting: *Deleted

## 2011-01-04 DIAGNOSIS — R0989 Other specified symptoms and signs involving the circulatory and respiratory systems: Secondary | ICD-10-CM

## 2011-01-12 ENCOUNTER — Encounter: Payer: Medicare Other | Admitting: *Deleted

## 2011-01-18 ENCOUNTER — Other Ambulatory Visit: Payer: Self-pay | Admitting: Adult Health

## 2011-01-30 ENCOUNTER — Ambulatory Visit (INDEPENDENT_AMBULATORY_CARE_PROVIDER_SITE_OTHER): Payer: Medicare Other | Admitting: *Deleted

## 2011-01-30 DIAGNOSIS — I4891 Unspecified atrial fibrillation: Secondary | ICD-10-CM

## 2011-01-30 DIAGNOSIS — Z7901 Long term (current) use of anticoagulants: Secondary | ICD-10-CM

## 2011-01-30 LAB — POCT INR: INR: 3

## 2011-01-31 ENCOUNTER — Other Ambulatory Visit: Payer: Self-pay | Admitting: Cardiology

## 2011-02-16 ENCOUNTER — Encounter: Payer: Self-pay | Admitting: Cardiology

## 2011-02-16 ENCOUNTER — Ambulatory Visit (INDEPENDENT_AMBULATORY_CARE_PROVIDER_SITE_OTHER): Payer: Medicare Other | Admitting: Cardiology

## 2011-02-16 VITALS — BP 127/80 | HR 88 | Ht 65.0 in | Wt 192.0 lb

## 2011-02-16 DIAGNOSIS — Z7901 Long term (current) use of anticoagulants: Secondary | ICD-10-CM

## 2011-02-16 DIAGNOSIS — I4891 Unspecified atrial fibrillation: Secondary | ICD-10-CM

## 2011-02-16 NOTE — Assessment & Plan Note (Signed)
Stable no change in treatment.

## 2011-02-16 NOTE — Patient Instructions (Signed)
Your physician recommends that you continue on your current medications as directed. Please refer to the Current Medication list given to you today.  Your physician recommends that you schedule a follow-up appointment in: 1 year  

## 2011-02-16 NOTE — Progress Notes (Signed)
HPI Marissa [name] comes in for evaluation management of her chronic atrial fib treated with rate control and anticoagulation. She's had no symptoms of palpitations, presyncope or syncope. She has to get around a cane because of bad arthritis. She denies any angina or chest discomfort.  A CT scan of her chest showed some coronary calcification and some cardiac enlargement. There was no effusion.  EKG was not repeated today. Meds reviewed and she is compliant. On Coumadin and followed in the Coumadin clinic. No melena or hematochezia. Past Medical History  Diagnosis Date  . Edema   . Atrial fibrillation   . Neoplasm     unspecifide nature digestive system    Past Surgical History  Procedure Date  . Appendectomy   . Abdominal hysterectomy     No family history on file.  History   Social History  . Marital Status: Widowed    Spouse Name: N/A    Number of Children: N/A  . Years of Education: N/A   Occupational History  . retired    Social History Main Topics  . Smoking status: Never Smoker   . Smokeless tobacco: Never Used  . Alcohol Use: No  . Drug Use: No  . Sexually Active: Not on file   Other Topics Concern  . Not on file   Social History Narrative  . No narrative on file    Not on File  Current Outpatient Prescriptions  Medication Sig Dispense Refill  . alendronate (FOSAMAX) 70 MG tablet Take 70 mg by mouth every 7 (seven) days. Take with a full glass of water on an empty stomach.       Marland Kitchen allopurinol (ZYLOPRIM) 100 MG tablet Take 100 mg by mouth as directed.        . Calcium-Vitamin D ([employer] CALCIUM-600/VIT D PO) Take 1 tablet by mouth daily.        . digoxin (LANOXIN) 0.125 MG tablet Take 125 mcg by mouth daily.        . furosemide (LASIX) 20 MG tablet Take 20 mg by mouth daily. Take 1/2 tab daily      . HYDROcodone-acetaminophen (VICODIN) 5-500 MG per tablet Take 1 tablet by mouth every 6 (six) hours as needed.        Marland Kitchen losartan-hydrochlorothiazide (HYZAAR)  50-12.5 MG per tablet TAKE ONE TABLET BY MOUTH DAILY  30 tablet  0  . metoprolol (TOPROL-XL) 50 MG 24 hr tablet Take 50 mg by mouth daily. Take 1/2 tab bid        . warfarin (COUMADIN) 5 MG tablet TAKE 1 TABLET BY MOUTH ON SUNDAY, TUESDAY, WEDNESDAY, FRIDAY AND SATURDAY AND  1/2 TABLET BY MOUTH ON MONDAY AND THURSDAY  60 tablet  1    ROS Negative other than HPI.   PE General Appearance: well developed, well nourished in no acute distress, obese, walking with a cane HEENT: symmetrical face, PERRLA, good dentition  Neck: no JVD, thyromegaly, or adenopathy, trachea midline Chest: symmetric without deformity Cardiac: PMI non-displaced, irregular rate and rhythmnormal S1, S2, no gallop or murmur Lung: clear to ausculation and percussion Vascular: all pulses full without bruits  Abdominal: nondistended, nontender, good bowel sounds, no HSM, no bruits Extremities: no cyanosis, clubbing, chronic pitting edema, no sign of DVT, no varicosities  Skin: normal color, no rashes Neuro: alert and oriented x 3, non-focal Pysch: normal affect Filed Vitals:   02/16/11 1112  BP: 127/80  Pulse: 88  Height: 5\' 5"  (1.651 m)  Weight: 192 lb (87.091  kg)  SpO2: 95%    EKG  Labs and Studies Reviewed.   Lab Results  Component Value Date   WBC 10.5 11/09/2010   HGB 13.8 11/09/2010   HCT 42.2 11/09/2010   MCV 93.8 11/09/2010   PLT 297 11/09/2010      Chemistry      Component Value Date/Time   NA 146* 11/09/2010 1227   K 4.2 11/09/2010 1227   CL 105 11/09/2010 1227   CO2 29 11/09/2010 1227   BUN 19 11/09/2010 1227   CREATININE 1.04 11/09/2010 1227   CREATININE 0.99 11/25/2008 0854      Component Value Date/Time   CALCIUM 10.3 11/09/2010 1227   ALKPHOS 47 09/17/2008 1221   AST 23 09/17/2008 1221   ALT 18 09/17/2008 1221   BILITOT 0.8 09/17/2008 1221       No results found for this basename: CHOL   No results found for this basename: HDL   No results found for this basename: LDLCALC   No results found for  this basename: TRIG   No results found for this basename: CHOLHDL   No results found for this basename: HGBA1C   Lab Results  Component Value Date   ALT 18 09/17/2008   AST 23 09/17/2008   ALKPHOS 47 09/17/2008   BILITOT 0.8 09/17/2008   No results found for this basename: TSH

## 2011-03-01 ENCOUNTER — Ambulatory Visit (INDEPENDENT_AMBULATORY_CARE_PROVIDER_SITE_OTHER): Payer: Medicare Other | Admitting: *Deleted

## 2011-03-01 DIAGNOSIS — I4891 Unspecified atrial fibrillation: Secondary | ICD-10-CM

## 2011-03-01 DIAGNOSIS — Z7901 Long term (current) use of anticoagulants: Secondary | ICD-10-CM

## 2011-03-02 ENCOUNTER — Other Ambulatory Visit: Payer: Self-pay | Admitting: Cardiology

## 2011-03-29 ENCOUNTER — Encounter: Payer: Medicare Other | Admitting: *Deleted

## 2011-03-29 ENCOUNTER — Encounter (INDEPENDENT_AMBULATORY_CARE_PROVIDER_SITE_OTHER): Payer: Self-pay | Admitting: Surgery

## 2011-03-30 ENCOUNTER — Ambulatory Visit (INDEPENDENT_AMBULATORY_CARE_PROVIDER_SITE_OTHER): Payer: Medicare Other | Admitting: *Deleted

## 2011-03-30 DIAGNOSIS — Z7901 Long term (current) use of anticoagulants: Secondary | ICD-10-CM

## 2011-03-30 DIAGNOSIS — I4891 Unspecified atrial fibrillation: Secondary | ICD-10-CM

## 2011-04-04 ENCOUNTER — Encounter (INDEPENDENT_AMBULATORY_CARE_PROVIDER_SITE_OTHER): Payer: Self-pay | Admitting: Surgery

## 2011-04-04 ENCOUNTER — Ambulatory Visit (INDEPENDENT_AMBULATORY_CARE_PROVIDER_SITE_OTHER): Payer: Medicare Other | Admitting: Surgery

## 2011-04-04 VITALS — BP 152/90 | HR 100 | Temp 97.0°F | Resp 18 | Ht 65.0 in | Wt 188.6 lb

## 2011-04-04 DIAGNOSIS — K432 Incisional hernia without obstruction or gangrene: Secondary | ICD-10-CM

## 2011-04-04 NOTE — Progress Notes (Signed)
Chief Complaint  Patient presents with  . Incisional Hernia    recurrent hernia at umbilicus    HISTORY: Patient is a 77 year old female known to my surgical practice. She underwent laparotomy in March 2010 for biopsy of the gastric mass to rule out malignancy. Final pathology was benign. She had concurrent primary repair of an incarcerated umbilical hernia.  Patient sustained a left hip fracture. She believes that her difficulty with mobility has contributed to a recurrence of her hernia at the level of the umbilicus. This has progressed over the past few months. She has had some intermittent GI symptoms which she thinks is related to the hernia. She has noted that it has increased in size slightly. She presents today to discuss repair.   Past Medical History  Diagnosis Date  . Edema   . Atrial fibrillation   . Neoplasm     unspecifide nature digestive system  . MVP (mitral valve prolapse)   . Hip fracture     left  . Hypertension      Current Outpatient Prescriptions  Medication Sig Dispense Refill  . alendronate (FOSAMAX) 70 MG tablet Take 70 mg by mouth every 7 (seven) days. Take with a full glass of water on an empty stomach.       Marland Kitchen allopurinol (ZYLOPRIM) 100 MG tablet Take 100 mg by mouth as directed.        . Calcium-Vitamin D (CVS CALCIUM-600/VIT D PO) Take 1 tablet by mouth daily.        . digoxin (LANOXIN) 0.125 MG tablet Take 125 mcg by mouth daily.        . furosemide (LASIX) 20 MG tablet Take 20 mg by mouth daily. Take 1/2 tab daily      . HYDROcodone-acetaminophen (VICODIN) 5-500 MG per tablet Take 1 tablet by mouth every 6 (six) hours as needed.        Marland Kitchen losartan-hydrochlorothiazide (HYZAAR) 50-12.5 MG per tablet TAKE ONE TABLET BY MOUTH DAILY  30 tablet  6  . metoprolol (TOPROL-XL) 50 MG 24 hr tablet Take 50 mg by mouth daily. Take 1/2 tab bid        . warfarin (COUMADIN) 5 MG tablet TAKE 1 TABLET BY MOUTH ON SUNDAY, TUESDAY, WEDNESDAY, FRIDAY AND SATURDAY AND   1/2 TABLET BY MOUTH ON MONDAY AND THURSDAY  60 tablet  1     Allergies  Allergen Reactions  . Codeine   . Penicillins Rash     No family history on file.   History   Social History  . Marital Status: Widowed    Spouse Name: N/A    Number of Children: N/A  . Years of Education: N/A   Occupational History  . retired    Social History Main Topics  . Smoking status: Never Smoker   . Smokeless tobacco: Never Used  . Alcohol Use: Yes     rare  . Drug Use: No  . Sexually Active: None   Other Topics Concern  . None   Social History Narrative  . None     REVIEW OF SYSTEMS - PERTINENT POSITIVES ONLY: Gradual enlargement of incisional hernia at level of umbilicus   EXAM: Filed Vitals:   04/04/11 1544  BP: 152/90  Pulse: 100  Temp: 97 F (36.1 C)  Resp: 18    HEENT: normocephalic; pupils equal and reactive; sclerae clear; dentition good; mucous membranes moist NECK:  No nodules; symmetric on extension; no palpable anterior or posterior cervical lymphadenopathy; no supraclavicular masses;  no tenderness CHEST: clear to auscultation bilaterally without rales, rhonchi, or wheezes CARDIAC: regular rate and rhythm without significant murmur; peripheral pulses are full ABDOMEN: Soft without distention; well healed upper midline incision; obvious bulge at level of umbilicus; examined standing and recumbent; likely multiple defects from below umbilicus to mid-epigastrium, reducible EXT:  non-tender without edema; no deformity NEURO: no gross focal deficits; no sign of tremor   LABORATORY RESULTS: See E-Chart for most recent results   RADIOLOGY RESULTS: See E-Chart or I-Site for most recent results   IMPRESSION: #1 reducible ventral incisional hernia #2 atrial fibrillation on anti-coagulation   PLAN: The patient and I discussed the above findings. She would like to proceed with operative repair. I presented her with the option of open repair with closure of the  fascial defects and application of mesh, versus laparoscopic repair with application of mesh but no reapproximation of the rectus musculature. We discussed the risk and the benefits. She understands. She would like to proceed with surgery, utilizing the laparoscopic technique.  Request preoperative cardiac clearance by Dr. Valera Castle.  Will also request his guidance regarding perioperative management of her anticoagulation.  Surgery will be scheduled at a time convenient for the patient.  The risks and benefits of the procedure have been discussed at length with the patient.  The patient understands the proposed procedure, potential alternative treatments, and the course of recovery to be expected.  All of the patient's questions have been answered at this time.  The patient wishes to proceed with surgery and will schedule a date for their procedure through our office staff.   Velora Heckler, MD, FACS General & Endocrine Surgery [facility]      Visit Diagnoses: 1. Incisional hernia     Primary Care Physician: Carylon Perches, MD

## 2011-04-06 ENCOUNTER — Telehealth: Payer: Self-pay | Admitting: Cardiology

## 2011-04-06 NOTE — Telephone Encounter (Signed)
NEED SURGICAL CLEARANCE FAXED OVER ON PATIENT SHE IS SCHEDULED FOR HERNIA REPAIR ON 04/17/11. WITH DR Ricky Stabs.  PT NEEDS TO BE OFF COUMADIN FOR 5-7 DAYS

## 2011-04-06 NOTE — Telephone Encounter (Signed)
She is cleared at low operative risk and can stop her Coumadin.

## 2011-04-12 ENCOUNTER — Other Ambulatory Visit (INDEPENDENT_AMBULATORY_CARE_PROVIDER_SITE_OTHER): Payer: Self-pay | Admitting: Surgery

## 2011-04-12 ENCOUNTER — Encounter (HOSPITAL_COMMUNITY): Payer: Medicare Other

## 2011-04-12 LAB — DIFFERENTIAL
Eosinophils Relative: 1 % (ref 0–5)
Lymphocytes Relative: 11 % — ABNORMAL LOW (ref 12–46)
Lymphs Abs: 1.4 10*3/uL (ref 0.7–4.0)
Monocytes Absolute: 0.9 10*3/uL (ref 0.1–1.0)
Monocytes Relative: 8 % (ref 3–12)
Neutro Abs: 9.8 10*3/uL — ABNORMAL HIGH (ref 1.7–7.7)

## 2011-04-12 LAB — CBC
HCT: 41.9
HCT: 41.3 % (ref 36.0–46.0)
Hemoglobin: 14.1
Hemoglobin: 12.9 g/dL (ref 12.0–15.0)
MCH: 28.9 pg (ref 26.0–34.0)
MCHC: 33.5
MCHC: 31.2 g/dL (ref 30.0–36.0)
MCV: 91.4
MCV: 92.4 fL (ref 78.0–100.0)
RBC: 4.59
RDW: 15.7 % — ABNORMAL HIGH (ref 11.5–15.5)

## 2011-04-12 LAB — URINE MICROSCOPIC-ADD ON

## 2011-04-12 LAB — BASIC METABOLIC PANEL
BUN: 21 mg/dL (ref 6–23)
Chloride: 100 mEq/L (ref 96–112)
Creatinine, Ser: 0.85 mg/dL (ref 0.50–1.10)
GFR calc Af Amer: 76 mL/min — ABNORMAL LOW (ref >90)
GFR calc non Af Amer: 65 mL/min — ABNORMAL LOW (ref >90)
Potassium: 3.9 mEq/L (ref 3.5–5.1)

## 2011-04-12 LAB — URINALYSIS, ROUTINE W REFLEX MICROSCOPIC
Bilirubin Urine: NEGATIVE
Glucose, UA: NEGATIVE mg/dL
Ketones, ur: NEGATIVE mg/dL
Nitrite: POSITIVE — AB
Specific Gravity, Urine: 1.008 (ref 1.005–1.030)
pH: 7 (ref 5.0–8.0)

## 2011-04-12 LAB — PROTIME-INR: Prothrombin Time: 14.1

## 2011-04-12 LAB — SURGICAL PCR SCREEN: MRSA, PCR: NEGATIVE

## 2011-04-13 ENCOUNTER — Other Ambulatory Visit (INDEPENDENT_AMBULATORY_CARE_PROVIDER_SITE_OTHER): Payer: Self-pay

## 2011-04-13 ENCOUNTER — Encounter: Payer: Medicare Other | Admitting: *Deleted

## 2011-04-13 NOTE — Progress Notes (Signed)
Patient aware. RX phone to pharmacy [phone] [facility]. RMP

## 2011-04-17 ENCOUNTER — Ambulatory Visit (HOSPITAL_COMMUNITY)
Admission: RE | Admit: 2011-04-17 | Discharge: 2011-04-18 | Disposition: A | Discharge status: Home / Self Care | Payer: Medicare Other | Source: Ambulatory Visit | Attending: Surgery | Admitting: Surgery

## 2011-04-17 DIAGNOSIS — Z01812 Encounter for preprocedural laboratory examination: Secondary | ICD-10-CM | POA: Insufficient documentation

## 2011-04-17 DIAGNOSIS — Z01810 Encounter for preprocedural cardiovascular examination: Secondary | ICD-10-CM | POA: Insufficient documentation

## 2011-04-17 DIAGNOSIS — K432 Incisional hernia without obstruction or gangrene: Secondary | ICD-10-CM

## 2011-04-17 DIAGNOSIS — I4891 Unspecified atrial fibrillation: Secondary | ICD-10-CM | POA: Insufficient documentation

## 2011-04-17 DIAGNOSIS — I10 Essential (primary) hypertension: Secondary | ICD-10-CM | POA: Insufficient documentation

## 2011-04-17 DIAGNOSIS — Z79899 Other long term (current) drug therapy: Secondary | ICD-10-CM | POA: Insufficient documentation

## 2011-04-17 DIAGNOSIS — R9431 Abnormal electrocardiogram [ECG] [EKG]: Secondary | ICD-10-CM | POA: Insufficient documentation

## 2011-04-17 HISTORY — PX: HERNIA REPAIR: SHX51

## 2011-04-17 LAB — PROTIME-INR
INR: 1.14 (ref 0.00–1.49)
Prothrombin Time: 14.8 seconds (ref 11.6–15.2)

## 2011-04-18 LAB — PROTIME-INR: INR: 1.16 (ref 0.00–1.49)

## 2011-04-21 ENCOUNTER — Telehealth (INDEPENDENT_AMBULATORY_CARE_PROVIDER_SITE_OTHER): Payer: Self-pay

## 2011-04-21 NOTE — Telephone Encounter (Signed)
Patient called to report no BM in 4 days.  Hernia repair 3 days ago. She has tried OTC products such as MOM, and eating fruit. She is passing gas. Per Dr. Gerrit Friends patient may take miralax twice daily. Advice given to patient along with increase activity and fluids.RMP

## 2011-04-24 ENCOUNTER — Ambulatory Visit (INDEPENDENT_AMBULATORY_CARE_PROVIDER_SITE_OTHER): Payer: Medicare Other | Admitting: *Deleted

## 2011-04-24 DIAGNOSIS — Z7901 Long term (current) use of anticoagulants: Secondary | ICD-10-CM

## 2011-04-24 DIAGNOSIS — I4891 Unspecified atrial fibrillation: Secondary | ICD-10-CM

## 2011-04-24 LAB — POCT INR: INR: 1.8

## 2011-04-26 NOTE — Op Note (Signed)
NAMESHANN, LEWELLYN NO.:  [id number]  MEDICAL RECORD NO.:  [id number]  LOCATION:  1536                         FACILITY:  [facility]  PHYSICIAN:  Velora Heckler, MD      DATE OF BIRTH:  May 27, 1933  DATE OF PROCEDURE:  04/17/2011                               OPERATIVE REPORT   PREOPERATIVE DIAGNOSIS:  Ventral incisional hernia.  POSTOPERATIVE DIAGNOSIS:  Ventral incisional hernia.  PROCEDURE:  Laparoscopic ventral incisional hernia repair with Parietex mesh (20 cm x 15 cm).  SURGEON:  Velora Heckler, MD, FACS  ASSISTANT:  Sandria Bales. Ezzard Standing, MD, FACS  ANESTHESIA:  General.  ESTIMATED BLOOD LOSS:  Minimal.  PREPARATION:  ChloraPrep.  COMPLICATIONS:  None.  INDICATIONS:  The patient is a 77 year old white female who had undergone previous laparotomy in March 2010 for gastric biopsy.  Final pathology was benign.  She had concurrent repair of an incarcerated umbilical hernia without mesh.  The patient has developed a progressive ventral incisional hernia at the umbilicus.  She now comes to surgery for repair.  BODY OF REPORT:  Procedures done in OR #1 at the [facility].  The patient was brought to the operating room, placed in the supine position on the operating room table.  Following administration of general anesthesia, the patient was positioned and then prepped and draped in the usual strict aseptic fashion.  After ascertaining that an adequate level of anesthesia had been achieved, an incision was made in the left upper quadrant at the costal margin.  Using a 5 mm Optiview trocar, the peritoneal cavity was accessed and insufflated with carbon dioxide.  Laparoscope was introduced.  Operative port was placed in the left lower quadrant.  Using gentle blunt dissection, adhesions to the anterior abdominal wall were taken down.  Care was taken to avoid injury to the bowel.  There are multiple fascial defects in the upper  midline incision, most of which are centered at the level of the umbilicus.  A third operative port was placed in the right lower quadrant under direct vision.  Remaining adhesions were taken down so that the entire abdominal wall can be visualized.  Falciform ligament was mobilized to the level of the superior most suture from the midline abdominal closure.  Measurements were taken on the fascial defects.  A sheet of Parietex composite mesh was selected measuring 20 x 15 cm.  It was prepared on the back table with 8 circumferential 0 Novafil sutures.  It was moistened and then rolled and inserted under direct vision into the peritoneal cavity where it was deployed.  Using the EndoCatch 8 sutures, all retrieved through the abdominal wall and pulled taut bringing the mesh into alignment on the anterior abdominal wall without laxity. There was wide coverage of all fascial defects.  Sutures were tied securely.  Using a secured strap tacking device, the mesh was secured to the abdominal wall with two concentric rows of secure strap tacks.  Good hemostasis was noted.  Good coverage of the fascial defect was noted. Pneumoperitoneum was released.  Ports were removed under direct vision and good hemostasis was noted at all port sites.  Carbon  dioxide was evacuated.  Port sites were anesthetized with local anesthetic.  Wounds were closed with interrupted 4-0 Monocryl subcuticular sutures.  Wounds were washed and dried.  Benzoin and Steri-Strips were applied.  Sterile dressings were applied.  Abdominal binder was applied.  The patient was awakened from anesthesia and brought to the recovery room.  The patient tolerated the procedure well.   Velora Heckler, MD, FACS     TMG/MEDQ  D:  04/17/2011  T:  04/17/2011  Job:  [id number]  cc:   Thomas C. Wall, MD, FACC [address]  [address] [place] [address]  Kingsley Callander. [name], MD Fax: [phone]  Electronically Signed by Darnell Level MD on 04/26/2011  11:42:31 AM

## 2011-04-26 NOTE — Discharge Summary (Signed)
  [name], BRAM NO.:  [id number]  MEDICAL RECORD NO.:  [id number]  LOCATION:  1536                         FACILITY:  [facility]  PHYSICIAN:  Velora Heckler, MD      DATE OF BIRTH:  [birth year]  DATE OF ADMISSION:  04/17/2011 DATE OF DISCHARGE:  04/18/2011                              DISCHARGE SUMMARY   REASON FOR ADMISSION:  Ventral incisional hernia.  BRIEF HISTORY:  The patient is a 77 year old white female with a history of laparotomy in March 2010 for gastric biopsy.  Final pathology was benign.  The patient had an incarcerated umbilical hernia, which was repaired primarily.  The patient has now had a recurrence of her ventral incisional hernia.  She comes to surgery for laparoscopic repair.  HOSPITAL COURSE:  The patient was admitted on the day of surgery.  She was taken to the operating room where she underwent laparoscopic ventral incisional hernia repair with Parietex polyester mesh.  Postoperative course was uneventful.  The patient is ambulatory.  She has tolerated a regular diet.  She is prepared for discharge.  DISCHARGE PLAN:  Patient is discharged home on the 1st postoperative day, April 18, 2011, in good condition, tolerating a regular diet, and ambulating independently.  DISCHARGE MEDICATIONS:  Include Percocet as needed for pain.  The patient was also completing a course of Bactrim for urinary tract infection.  The patient will be seen back in my office at [facility] in 2-3 weeks.  FINAL DIAGNOSIS:  Ventral incisional hernia.  CONDITION AT DISCHARGE:  Good.     Velora Heckler, MD     TMG/MEDQ  D:  04/18/2011  T:  04/18/2011  Job:  [id number]  cc:   Kingsley Callander. [name], MD Fax: [phone]  Jesse Sans. Wall, MD, FACC [address]  [address] [place] [address]  Electronically Signed by Darnell Level MD on 04/26/2011 11:42:02 AM

## 2011-05-03 ENCOUNTER — Encounter (INDEPENDENT_AMBULATORY_CARE_PROVIDER_SITE_OTHER): Payer: Self-pay | Admitting: Surgery

## 2011-05-03 ENCOUNTER — Ambulatory Visit (INDEPENDENT_AMBULATORY_CARE_PROVIDER_SITE_OTHER): Payer: Medicare Other | Admitting: Surgery

## 2011-05-03 VITALS — BP 136/72 | HR 80 | Temp 97.2°F | Resp 20 | Ht 65.0 in | Wt 191.1 lb

## 2011-05-03 DIAGNOSIS — K432 Incisional hernia without obstruction or gangrene: Secondary | ICD-10-CM

## 2011-05-03 NOTE — Patient Instructions (Signed)
  COCOA BUTTER & VITAMIN E CREAM  (Palmer's or other brand)  Apply cocoa butter/vitamin E cream to your incision 2 - 3 times daily.  Massage cream into incision for one minute with each application.  Use sunscreen (50 SPF or higher) for first 6 months after surgery.  You may substitute Mederma or other scar reducing creams as desired.   

## 2011-05-03 NOTE — Progress Notes (Signed)
Visit Diagnoses: 1. Incisional hernia     HISTORY: Patient underwent laparoscopic ventral incisional hernia repair with mesh on April 17, 2011. She returns for her first postoperative visit. Overall she is doing well. She continues to wear her abdominal binder regularly.  EXAM: Patient is examined in the standing position. His surgical wounds are healing nicely without sign of infection. Small seroma at the site of the previous hernia. With Valsalva there is no sign of recurrent hernia.  IMPRESSION: Status post laparoscopic ventral incisional hernia repair with mesh, no complications, satisfactory progress  PLAN: Patient will continue to wear the abdominal binder during the daytime. She may remove it at night. She will apply topical creams to her incisions. She is restricted from any heavy lifting. She will return to see me in 4 weeks for a final wound check.  Velora Heckler, MD, FACS General & Endocrine Surgery [facility]

## 2011-05-10 ENCOUNTER — Encounter (INDEPENDENT_AMBULATORY_CARE_PROVIDER_SITE_OTHER): Payer: Self-pay | Admitting: Surgery

## 2011-05-10 ENCOUNTER — Ambulatory Visit (INDEPENDENT_AMBULATORY_CARE_PROVIDER_SITE_OTHER): Payer: Medicare Other | Admitting: Surgery

## 2011-05-10 VITALS — BP 152/90 | HR 70 | Temp 97.6°F | Resp 16 | Ht 65.0 in | Wt 190.0 lb

## 2011-05-10 DIAGNOSIS — K432 Incisional hernia without obstruction or gangrene: Secondary | ICD-10-CM

## 2011-05-10 NOTE — Patient Instructions (Signed)
Return for re-evaluation in about 3 weeks.   COCOA BUTTER & VITAMIN E CREAM  (Palmer's or other brand)  Apply cocoa butter/vitamin E cream to your incision 2 - 3 times daily.  Massage cream into incision for one minute with each application.  Use sunscreen (50 SPF or higher) for first 6 months after surgery.  You may substitute Mederma or other scar reducing creams as desired.

## 2011-05-10 NOTE — Progress Notes (Signed)
Visit Diagnoses: 1. Incisional hernia     HISTORY: Patient returns for wound check. She has noted a bulge just to the left and below the umbilicus.   EXAM: Patient is examined both in the standing and recumbent position. There is approximately a 3 cm subcutaneous mass to the left and inferior to the umbilicus. This most likely represents a seroma. It does not augment with cough or Valsalva.   IMPRESSION: Status post laparoscopic ventral incisional hernia repair with mesh. Possible small seroma versus recurrent hernia.   PLAN: The patient will continue activity is normal. She will wear her abdominal binder during the day. She will return in 3 weeks for evaluation.   Velora Heckler, MD, FACS General & Endocrine Surgery [facility]

## 2011-05-11 ENCOUNTER — Ambulatory Visit (INDEPENDENT_AMBULATORY_CARE_PROVIDER_SITE_OTHER): Payer: Medicare Other | Admitting: *Deleted

## 2011-05-11 DIAGNOSIS — I4891 Unspecified atrial fibrillation: Secondary | ICD-10-CM

## 2011-05-11 DIAGNOSIS — Z7901 Long term (current) use of anticoagulants: Secondary | ICD-10-CM

## 2011-06-07 ENCOUNTER — Ambulatory Visit (INDEPENDENT_AMBULATORY_CARE_PROVIDER_SITE_OTHER): Payer: Medicare Other | Admitting: Surgery

## 2011-06-07 ENCOUNTER — Encounter (INDEPENDENT_AMBULATORY_CARE_PROVIDER_SITE_OTHER): Payer: Self-pay | Admitting: Surgery

## 2011-06-07 VITALS — BP 144/82 | HR 66 | Temp 97.7°F | Resp 14 | Ht 65.0 in | Wt 191.2 lb

## 2011-06-07 DIAGNOSIS — K432 Incisional hernia without obstruction or gangrene: Secondary | ICD-10-CM

## 2011-06-07 NOTE — Patient Instructions (Signed)
  COCOA BUTTER & VITAMIN E CREAM  (Palmer's or other brand)  Apply cocoa butter/vitamin E cream to your incision 2 - 3 times daily.  Massage cream into incision for one minute with each application.  Use sunscreen (50 SPF or higher) for first 6 months after surgery.  You may substitute Mederma or other scar reducing creams as desired.    May wear abdominal binder for comfort.  tmg

## 2011-06-07 NOTE — Progress Notes (Signed)
Visit Diagnoses: 1. Incisional hernia     HISTORY: Patient returns for a postoperative visit having undergone laparoscopic ventral incisional hernia repair with mesh. She is still having some minor discomfort in the left lower quadrant of the abdominal wall with certain physical activities. She is anxious to stop wearing her abdominal binder.  EXAM: Abdomen is examined in a standing position. Surgical wounds are well healed. No sign of recurrent hernia. Small seroma just beneath the umbilicus is resolving. No palpable abnormality in the left lower quadrant.  IMPRESSION: Status post ventral incisional hernia repair with mesh  PLAN: Patient may resume all of her normal activities. She may wear the abdominal binder for comfort as needed. She will return to see me in this office as needed.   Velora Heckler, MD, FACS General & Endocrine Surgery [facility]

## 2011-06-08 ENCOUNTER — Ambulatory Visit (INDEPENDENT_AMBULATORY_CARE_PROVIDER_SITE_OTHER): Payer: Medicare Other | Admitting: *Deleted

## 2011-06-08 DIAGNOSIS — Z7901 Long term (current) use of anticoagulants: Secondary | ICD-10-CM

## 2011-06-08 DIAGNOSIS — I4891 Unspecified atrial fibrillation: Secondary | ICD-10-CM

## 2011-06-08 LAB — POCT INR: INR: 2.5

## 2011-06-21 ENCOUNTER — Other Ambulatory Visit: Payer: Self-pay | Admitting: Adult Health

## 2011-06-21 ENCOUNTER — Other Ambulatory Visit: Payer: Self-pay | Admitting: Cardiology

## 2011-07-06 ENCOUNTER — Ambulatory Visit (INDEPENDENT_AMBULATORY_CARE_PROVIDER_SITE_OTHER): Payer: Medicare Other | Admitting: *Deleted

## 2011-07-06 DIAGNOSIS — Z7901 Long term (current) use of anticoagulants: Secondary | ICD-10-CM

## 2011-07-06 DIAGNOSIS — I4891 Unspecified atrial fibrillation: Secondary | ICD-10-CM

## 2011-07-06 LAB — POCT INR: INR: 2.7

## 2011-07-21 ENCOUNTER — Emergency Department (HOSPITAL_COMMUNITY): Payer: Medicare Other

## 2011-07-21 ENCOUNTER — Encounter (HOSPITAL_COMMUNITY): Payer: Self-pay | Admitting: *Deleted

## 2011-07-21 ENCOUNTER — Other Ambulatory Visit: Payer: Self-pay

## 2011-07-21 ENCOUNTER — Inpatient Hospital Stay (HOSPITAL_COMMUNITY)
Admission: AD | Admit: 2011-07-21 | Discharge: 2011-07-26 | DRG: 282 | Disposition: A | Discharge status: Home / Self Care | Payer: Medicare Other | Attending: Cardiology | Admitting: Cardiology

## 2011-07-21 DIAGNOSIS — I509 Heart failure, unspecified: Secondary | ICD-10-CM | POA: Diagnosis present

## 2011-07-21 DIAGNOSIS — W19XXXA Unspecified fall, initial encounter: Secondary | ICD-10-CM

## 2011-07-21 DIAGNOSIS — I2510 Atherosclerotic heart disease of native coronary artery without angina pectoris: Secondary | ICD-10-CM | POA: Diagnosis present

## 2011-07-21 DIAGNOSIS — I4891 Unspecified atrial fibrillation: Secondary | ICD-10-CM

## 2011-07-21 DIAGNOSIS — N390 Urinary tract infection, site not specified: Secondary | ICD-10-CM

## 2011-07-21 DIAGNOSIS — I5023 Acute on chronic systolic (congestive) heart failure: Principal | ICD-10-CM | POA: Diagnosis present

## 2011-07-21 DIAGNOSIS — R509 Fever, unspecified: Secondary | ICD-10-CM

## 2011-07-21 DIAGNOSIS — E876 Hypokalemia: Secondary | ICD-10-CM | POA: Diagnosis present

## 2011-07-21 DIAGNOSIS — I5021 Acute systolic (congestive) heart failure: Secondary | ICD-10-CM

## 2011-07-21 DIAGNOSIS — I214 Non-ST elevation (NSTEMI) myocardial infarction: Secondary | ICD-10-CM | POA: Diagnosis present

## 2011-07-21 DIAGNOSIS — R296 Repeated falls: Secondary | ICD-10-CM

## 2011-07-21 HISTORY — DX: Chronic systolic (congestive) heart failure: I50.22

## 2011-07-21 HISTORY — DX: Other cardiomyopathies: I42.8

## 2011-07-21 LAB — URINE CULTURE: Colony Count: >=100000

## 2011-07-21 LAB — CK: Total CK: 479 U/L — ABNORMAL HIGH (ref 7–177)

## 2011-07-21 LAB — URINE MICROSCOPIC-ADD ON

## 2011-07-21 LAB — URINALYSIS, ROUTINE W REFLEX MICROSCOPIC
Glucose, UA: NEGATIVE mg/dL
Ketones, ur: NEGATIVE mg/dL
Protein, ur: >300 mg/dL — AB

## 2011-07-21 LAB — CBC
MCHC: 30.8 g/dL (ref 30.0–36.0)
RDW: 15 % (ref 11.5–15.5)
WBC: 7.9 10*3/uL (ref 4.0–10.5)

## 2011-07-21 LAB — CULTURE, BLOOD (ROUTINE X 2): Culture: NO GROWTH

## 2011-07-21 LAB — PROTIME-INR
INR: 1.65 — ABNORMAL HIGH (ref 0.00–1.49)
Prothrombin Time: 19.8 seconds — ABNORMAL HIGH (ref 11.6–15.2)

## 2011-07-21 LAB — DIGOXIN LEVEL: Digoxin Level: 0.7 ng/mL — ABNORMAL LOW (ref 0.8–2.0)

## 2011-07-21 LAB — BASIC METABOLIC PANEL
Chloride: 101 mEq/L (ref 96–112)
GFR calc Af Amer: 60 mL/min — ABNORMAL LOW (ref >90)
GFR calc non Af Amer: 51 mL/min — ABNORMAL LOW (ref >90)
Potassium: 3.5 mEq/L (ref 3.5–5.1)
Sodium: 142 mEq/L (ref 135–145)

## 2011-07-21 MED ORDER — MORPHINE SULFATE 2 MG/ML IJ SOLN
2.0000 mg | Freq: Once | INTRAMUSCULAR | Status: AC
  Administered 2011-07-21: 2 mg via INTRAVENOUS
  Filled 2011-07-21: qty 1

## 2011-07-21 MED ORDER — DILTIAZEM HCL 50 MG/10ML IV SOLN
10.0000 mg | Freq: Once | INTRAVENOUS | Status: AC
  Administered 2011-07-21: 10 mg via INTRAVENOUS
  Filled 2011-07-21: qty 10

## 2011-07-21 MED ORDER — DEXTROSE 5 % IV SOLN
5.0000 mg/h | INTRAVENOUS | Status: DC
  Administered 2011-07-21 – 2011-07-22 (×3): 5 mg/h via INTRAVENOUS
  Filled 2011-07-21: qty 100

## 2011-07-21 MED ORDER — SODIUM CHLORIDE 0.9 % IV SOLN: Freq: Once | INTRAVENOUS | Status: DC

## 2011-07-21 MED ORDER — FUROSEMIDE 10 MG/ML IJ SOLN
60.0000 mg | Freq: Once | INTRAMUSCULAR | Status: AC
  Administered 2011-07-21: 15:00:00 via INTRAVENOUS

## 2011-07-21 MED ORDER — LEVOFLOXACIN IN D5W 750 MG/150ML IV SOLN
750.0000 mg | Freq: Once | INTRAVENOUS | Status: AC
  Administered 2011-07-21 (×2): 750 mg via INTRAVENOUS
  Filled 2011-07-21: qty 150

## 2011-07-21 MED ORDER — ACETAMINOPHEN 650 MG RE SUPP
650.0000 mg | Freq: Once | RECTAL | Status: AC
  Administered 2011-07-21: 650 mg via RECTAL

## 2011-07-21 NOTE — ED Provider Notes (Signed)
History     CSN: [id number]  Arrival date & time 07/21/11  1329   First MD Initiated Contact with Patient 07/21/11 1353      Chief Complaint  Patient presents with  . Shortness of Breath   History obtained from daughter and patient  (Consider location/radiation/quality/duration/timing/severity/associated sxs/prior treatment) HPI  Patient relates 2 days ago she started having a sore throat. She also started feeling short of breath with a dry cough. She's had chills and fever. She had nausea and vomiting the first day. She fell that day and fell onto  her left hip. She relates this morning she was in the bathroom and when she tried to get up off the toilet she fell onto the bathroom floor. This was about 3:30 in the morning. Her daughter found her about 12 noon today. Her daughter and son-in-law were able to get her up. She states she's unable to walk because she is weak. She denies any pains in her hips she does state she has pain in her back which is from an injury a year ago. She denies chest pain headache neck pain.    PCP Dr. [name] Cardiologist Dr. Daleen Squibb   Past Medical History  Diagnosis Date  . Edema   . Atrial fibrillation   . Neoplasm     unspecifide nature digestive system  . MVP (mitral valve prolapse)   . Hip fracture     left  . Hypertension    rheumatoid arthritis Patient denies coronary artery disease or having an MI or stent  Past Surgical History  Procedure Date  . Appendectomy   . Abdominal hysterectomy   . Hernia repair 09/2008  . Hernia repair 04/17/11    lap ventral incisional hernia repair with mesh     History reviewed. No pertinent family history.  History  Substance Use Topics  . Smoking status: Never Smoker   . Smokeless tobacco: Never Used  . Alcohol Use: Yes     rare   lives alone Uses a cane  OB History    Grav Para Term Preterm Abortions TAB SAB Ect Mult Living                  Review of Systems  All other systems reviewed and  are negative.    Allergies  Codeine and Penicillins  Home Medications   Current Outpatient Rx  Name Route Sig Dispense Refill  . ALENDRONATE SODIUM 70 MG PO TABS Oral Take 70 mg by mouth every 7 (seven) days. Take with a full glass of water on an empty stomach.     . ALLOPURINOL 100 MG PO TABS Oral Take 100 mg by mouth as directed.      . [facility] CALCIUM-600/VIT D PO Oral Take 1 tablet by mouth daily.      Marland Kitchen COUMADIN 5 MG PO TABS  TAKE 1 TABLET BY MOUTH EVERY DAY , EXCEPT TAKE  1/2 TABLET DAILY ON MONDAYS AND THURSDAYS 60 tablet 0  . DIGOXIN 0.125 MG PO TABS Oral Take 125 mcg by mouth daily.      . FUROSEMIDE 20 MG PO TABS Oral Take 20 mg by mouth daily. Take 1/2 tab daily    . HYDROCODONE-ACETAMINOPHEN 5-500 MG PO TABS Oral Take 1 tablet by mouth every 6 (six) hours as needed.      Marland Kitchen LOSARTAN POTASSIUM-HCTZ 50-12.5 MG PO TABS  TAKE ONE TABLET BY MOUTH DAILY 30 tablet 6  . METOPROLOL SUCCINATE ER 50 MG PO TB24  TAKE  1/2 TABLET BY MOUTH TWICE DAILY 30 tablet 9  Prednisone 5 mg a day Neurontin 300 mg a day Hydroxychloroquine 200 mg a day Folic acid at 1 mg a day     BP 182/98  Pulse 119  Temp(Src) 102.6 F (39.2 C) (Oral)  Resp 28  Wt 195 lb (88.451 kg)  SpO2 94% Vital signs normal for hypertension, tachycardia, fever, tachypnea    Physical Exam  Nursing note and vitals reviewed. Constitutional: She is oriented to person, place, and time. She appears well-developed and well-nourished.  Non-toxic appearance. She does not appear ill. No distress.       Elderly white female who is alert and cooperative she appears to be tachypneic  HENT:  Head: Normocephalic and atraumatic.  Right Ear: External ear normal.  Left Ear: External ear normal.  Nose: Nose normal. No mucosal edema or rhinorrhea.  Mouth/Throat: Oropharynx is clear and moist and mucous membranes are normal. No dental abscesses or uvula swelling.       Patient has a round reddened area on her left forehead is mild  swelling. She denies any headache or pain in that area  Eyes: Conjunctivae and EOM are normal. Pupils are equal, round, and reactive to light.  Neck: Normal range of motion and full passive range of motion without pain. Neck supple.  Cardiovascular: Normal heart sounds and normal pulses.  An irregularly irregular rhythm present. Tachycardia present.  Exam reveals no gallop (, she has mild retractions, and she appears to be a little short of breath with talking) and no friction rub.   No murmur heard. Pulmonary/Chest: She is in respiratory distress. She has no wheezes. She has no rhonchi. She has rales. She exhibits no tenderness and no crepitus.       Patient appears to have some rales at the bases  Abdominal: Soft. Normal appearance and bowel sounds are normal. She exhibits no distension. There is no tenderness. There is no rebound and no guarding.  Musculoskeletal: Normal range of motion. She exhibits no edema and no tenderness.       Moves all extremities well. She has no pain in her hips on range of motion. She has mild pain on her right knee on range of motion however there's no bruising or swelling seen. Patient does have underlying rheumatoid arthritis.  Neurological: She is alert and oriented to person, place, and time. She has normal strength. No cranial nerve deficit.  Skin: Skin is warm, dry and intact. No rash noted. There is erythema. No pallor.       Patient has diffuse erythema of her back and her chest which family states is not usual. Her skin feels warm to touch  Psychiatric: She has a normal mood and affect. Her speech is normal and behavior is normal. Her mood appears not anxious.    ED Course  Procedures (including critical care time)  Pt was started on cardiazem bolus and drip for her atrial fibrillation. She was given lasix IV and had over 1000 cc of urine output.   After reviewing her urine results and her CXR she was started on levaquin for UTI/bronchitis.   18:36 Dr  Antoine Poche accepts in transfer to [facility] step-down  19:33 Dr Antoine Poche made aware of her fever and abnormal UA.   Results for orders placed during the hospital encounter of 07/21/11  CBC      Component Value Range   WBC 7.9  4.0 - 10.5 (K/uL)   RBC 4.76  3.87 - 5.11 (MIL/uL)   Hemoglobin 13.4  12.0 - 15.0 (g/dL)   HCT 11.9  14.7 - 82.9 (%)   MCV 91.4  78.0 - 100.0 (fL)   MCH 28.2  26.0 - 34.0 (pg)   MCHC 30.8  30.0 - 36.0 (g/dL)   RDW 56.2  13.0 - 86.5 (%)   Platelets 179  150 - 400 (K/uL)  BASIC METABOLIC PANEL      Component Value Range   Sodium 142  135 - 145 (mEq/L)   Potassium 3.5  3.5 - 5.1 (mEq/L)   Chloride 101  96 - 112 (mEq/L)   CO2 29  19 - 32 (mEq/L)   Glucose, Bld 172 (*) 70 - 99 (mg/dL)   BUN 23  6 - 23 (mg/dL)   Creatinine, Ser 7.84  0.50 - 1.10 (mg/dL)   Calcium 9.7  8.4 - 69.6 (mg/dL)   GFR calc non Af Amer 51 (*) >90 (mL/min)   GFR calc Af Amer 60 (*) >90 (mL/min)  PROTIME-INR      Component Value Range   Prothrombin Time 19.8 (*) 11.6 - 15.2 (seconds)   INR 1.65 (*) 0.00 - 1.49   DIGOXIN LEVEL      Component Value Range   Digoxin Level 0.7 (*) 0.8 - 2.0 (ng/mL)  PRO B NATRIURETIC PEPTIDE      Component Value Range   Pro B Natriuretic peptide (BNP) 10673.0 (*) 0 - 450 (pg/mL)  CULTURE, BLOOD (ROUTINE X 2)      Component Value Range   Specimen Description BLOOD LEFT ARM     Special Requests BOTTLES DRAWN AEROBIC AND ANAEROBIC  6 CC EACH     Culture PENDING     Report Status PENDING    CULTURE, BLOOD (ROUTINE X 2)      Component Value Range   Specimen Description BLOOD LEFT ARM     Special Requests BOTTLES DRAWN AEROBIC AND ANAEROBIC  4CC EACH     Culture PENDING     Report Status PENDING    URINALYSIS, ROUTINE W REFLEX MICROSCOPIC      Component Value Range   Color, Urine YELLOW  YELLOW    APPearance CLOUDY (*) CLEAR    Specific Gravity, Urine >1.030 (*) 1.005 - 1.030    pH 6.0  5.0 - 8.0    Glucose, UA NEGATIVE  NEGATIVE (mg/dL)   Hgb urine dipstick  LARGE (*) NEGATIVE    Bilirubin Urine NEGATIVE  NEGATIVE    Ketones, ur NEGATIVE  NEGATIVE (mg/dL)   Protein, ur >295 (*) NEGATIVE (mg/dL)   Urobilinogen, UA 1.0  0.0 - 1.0 (mg/dL)   Nitrite NEGATIVE  NEGATIVE    Leukocytes, UA NEGATIVE  NEGATIVE   CK      Component Value Range   Total CK 479 (*) 7 - 177 (U/L)  POCT I-STAT TROPONIN I      Component Value Range   Troponin i, poc 5.12 (*) 0.00 - 0.08 (ng/mL)   Comment 3           URINE MICROSCOPIC-ADD ON      Component Value Range   WBC, UA 11-20  <3 (WBC/hpf)   RBC / HPF 3-6  <3 (RBC/hpf)   Bacteria, UA MANY (*) RARE    Laboratory interpretation all normal except hyperglycemia, subtherapeutic Coumadin, subtherapeutic digoxin level, positive troponin   Dg Chest Portable 1 View  07/21/2011  *RADIOLOGY REPORT*  Clinical Data: Shortness of breath.  PORTABLE CHEST - 1 VIEW  Comparison: Chest x-ray 12/17/2007, chest CT 12/21/2010  Findings: Heart is enlarged.  There is interstitial pulmonary edema.  No focal consolidations or definite pleural effusions are present.  IMPRESSION: Cardiomegaly.  Mild interstitial edema.  Original Report Authenticated By: Patterson Hammersmith, M.D.       Date: 07/21/2011  Rate: 123  Rhythm: atrial fibrillation  QRS Axis: right  Intervals: normal  ST/T Wave abnormalities: nonspecific ST/T changes  Conduction Disutrbances:none  Narrative Interpretation: Q waves anterior leads  Old EKG Reviewed: changes noted from 04/12/2011 afib with rate 73    1. Congestive heart failure (CHF)   2. NSTEMI (non-ST elevated myocardial infarction)   3. Fall   4. Atrial fibrillation with RVR   5. Fever   6. Urinary tract infection    Plan transfer to [facility]  Devoria Albe, MD, FACEP    MDM          Ward Givens, MD 07/21/11 [phone]

## 2011-07-21 NOTE — ED Notes (Signed)
Patient is resting comfortably. 

## 2011-07-21 NOTE — ED Notes (Signed)
Sob ,fell x2 recently, Bruise to left hip. Alert, talking.  Has been unable to take meds for 2-3 days

## 2011-07-21 NOTE — ED Notes (Signed)
Alert, talking, family at bedside.  NAD at present.

## 2011-07-21 NOTE — ED Notes (Signed)
Family at bedside. Patient was placed on bedpan. Patient states she will notify me when ready to get off.

## 2011-07-21 NOTE — ED Notes (Signed)
Patient transported to CT 

## 2011-07-21 NOTE — ED Notes (Signed)
Patient transported to X-ray 

## 2011-07-21 NOTE — ED Notes (Signed)
Report called to [facility] for [phone]

## 2011-07-22 DIAGNOSIS — I4891 Unspecified atrial fibrillation: Secondary | ICD-10-CM

## 2011-07-22 DIAGNOSIS — I214 Non-ST elevation (NSTEMI) myocardial infarction: Secondary | ICD-10-CM | POA: Diagnosis present

## 2011-07-22 LAB — APTT: aPTT: 50 seconds — ABNORMAL HIGH (ref 24–37)

## 2011-07-22 LAB — CARDIAC PANEL(CRET KIN+CKTOT+MB+TROPI)
CK, MB: 5 ng/mL — ABNORMAL HIGH (ref 0.3–4.0)
CK, MB: 4 ng/mL (ref 0.3–4.0)
Troponin I: 3.48 ng/mL (ref <0.30)

## 2011-07-22 LAB — MAGNESIUM: Magnesium: 1.7 mg/dL (ref 1.5–2.5)

## 2011-07-22 MED ORDER — HYDROCODONE-ACETAMINOPHEN 5-325 MG PO TABS
1.0000 | ORAL_TABLET | Freq: Four times a day (QID) | ORAL | Status: DC | PRN
  Administered 2011-07-22 – 2011-07-26 (×5): 1 via ORAL
  Filled 2011-07-22 (×3): qty 1
  Filled 2011-07-22: qty 2
  Filled 2011-07-22: qty 1

## 2011-07-22 MED ORDER — LOSARTAN POTASSIUM 50 MG PO TABS
50.0000 mg | ORAL_TABLET | Freq: Every day | ORAL | Status: DC
  Administered 2011-07-22 – 2011-07-26 (×5): 50 mg via ORAL
  Filled 2011-07-22 (×5): qty 1

## 2011-07-22 MED ORDER — ENOXAPARIN SODIUM 100 MG/ML ~~LOC~~ SOLN
1.0000 mg/kg | Freq: Two times a day (BID) | SUBCUTANEOUS | Status: DC
  Administered 2011-07-22: 23:00:00 via SUBCUTANEOUS
  Administered 2011-07-22 – 2011-07-23 (×2): 85 mg via SUBCUTANEOUS
  Filled 2011-07-22 (×4): qty 1

## 2011-07-22 MED ORDER — ROSUVASTATIN CALCIUM 10 MG PO TABS
10.0000 mg | ORAL_TABLET | Freq: Every day | ORAL | Status: DC
  Administered 2011-07-22 – 2011-07-25 (×4): 10 mg via ORAL
  Filled 2011-07-22 (×6): qty 1

## 2011-07-22 MED ORDER — FUROSEMIDE 10 MG/ML IJ SOLN
40.0000 mg | Freq: Once | INTRAMUSCULAR | Status: AC
  Administered 2011-07-23: 40 mg via INTRAVENOUS
  Filled 2011-07-22: qty 4

## 2011-07-22 MED ORDER — DIGOXIN 125 MCG PO TABS
125.0000 ug | ORAL_TABLET | Freq: Every day | ORAL | Status: DC
  Administered 2011-07-22 – 2011-07-26 (×5): 125 ug via ORAL
  Filled 2011-07-22 (×5): qty 1

## 2011-07-22 MED ORDER — PREDNISONE 5 MG PO TABS
5.0000 mg | ORAL_TABLET | Freq: Every day | ORAL | Status: DC
  Administered 2011-07-22 – 2011-07-26 (×5): 5 mg via ORAL
  Filled 2011-07-22 (×5): qty 1

## 2011-07-22 MED ORDER — FOLIC ACID 1 MG PO TABS
1.0000 mg | ORAL_TABLET | Freq: Every day | ORAL | Status: DC
  Administered 2011-07-22 – 2011-07-26 (×5): 1 mg via ORAL
  Filled 2011-07-22 (×5): qty 1

## 2011-07-22 MED ORDER — ALENDRONATE SODIUM 70 MG PO TABS: 70.0000 mg | ORAL_TABLET | ORAL | Status: DC

## 2011-07-22 MED ORDER — FEBUXOSTAT 40 MG PO TABS
40.0000 mg | ORAL_TABLET | Freq: Every day | ORAL | Status: DC
  Administered 2011-07-22 – 2011-07-26 (×5): 40 mg via ORAL
  Filled 2011-07-22 (×5): qty 1

## 2011-07-22 MED ORDER — SODIUM CHLORIDE 0.9 % IJ SOLN
3.0000 mL | INTRAMUSCULAR | Status: DC | PRN
  Administered 2011-07-24: 3 mL via INTRAVENOUS

## 2011-07-22 MED ORDER — WARFARIN SODIUM 5 MG PO TABS
5.0000 mg | ORAL_TABLET | Freq: Once | ORAL | Status: AC
  Administered 2011-07-22: 5 mg via ORAL
  Filled 2011-07-22: qty 1

## 2011-07-22 MED ORDER — SODIUM CHLORIDE 0.9 % IV SOLN: 250.0000 mL | INTRAVENOUS | Status: DC | PRN

## 2011-07-22 MED ORDER — LOSARTAN POTASSIUM-HCTZ 50-12.5 MG PO TABS: 1.0000 | ORAL_TABLET | Freq: Every day | ORAL | Status: DC

## 2011-07-22 MED ORDER — GABAPENTIN 300 MG PO CAPS
300.0000 mg | ORAL_CAPSULE | Freq: Every day | ORAL | Status: DC
  Administered 2011-07-22 – 2011-07-23 (×2): 300 mg via ORAL
  Filled 2011-07-22 (×3): qty 1

## 2011-07-22 MED ORDER — ACETAMINOPHEN 325 MG PO TABS: 650.0000 mg | ORAL_TABLET | ORAL | Status: DC | PRN

## 2011-07-22 MED ORDER — HYDROXYCHLOROQUINE SULFATE 200 MG PO TABS
200.0000 mg | ORAL_TABLET | Freq: Every day | ORAL | Status: DC
  Administered 2011-07-22 – 2011-07-26 (×4): 200 mg via ORAL
  Filled 2011-07-22 (×5): qty 1

## 2011-07-22 MED ORDER — ONDANSETRON HCL 4 MG/2ML IJ SOLN: 4.0000 mg | Freq: Four times a day (QID) | INTRAMUSCULAR | Status: DC | PRN

## 2011-07-22 MED ORDER — ASPIRIN 325 MG PO TABS
325.0000 mg | ORAL_TABLET | Freq: Every day | ORAL | Status: DC
  Administered 2011-07-24 – 2011-07-25 (×2): 325 mg via ORAL
  Filled 2011-07-22 (×4): qty 1

## 2011-07-22 MED ORDER — ALLOPURINOL 100 MG PO TABS
200.0000 mg | ORAL_TABLET | Freq: Every day | ORAL | Status: DC
  Administered 2011-07-22 – 2011-07-26 (×5): 200 mg via ORAL
  Filled 2011-07-22 (×5): qty 2

## 2011-07-22 MED ORDER — METOPROLOL SUCCINATE ER 25 MG PO TB24
25.0000 mg | ORAL_TABLET | Freq: Every day | ORAL | Status: DC
  Administered 2011-07-22 – 2011-07-23 (×2): 25 mg via ORAL
  Filled 2011-07-22 (×2): qty 1

## 2011-07-22 MED ORDER — ACETAMINOPHEN 325 MG PO TABS: 650.0000 mg | ORAL_TABLET | Freq: Four times a day (QID) | ORAL | Status: DC | PRN

## 2011-07-22 MED ORDER — SODIUM CHLORIDE 0.9 % IJ SOLN
3.0000 mL | Freq: Two times a day (BID) | INTRAMUSCULAR | Status: DC
  Administered 2011-07-22 – 2011-07-24 (×7): 3 mL via INTRAVENOUS

## 2011-07-22 MED ORDER — HYDROCHLOROTHIAZIDE 12.5 MG PO CAPS
12.5000 mg | ORAL_CAPSULE | Freq: Every day | ORAL | Status: DC
  Administered 2011-07-22 – 2011-07-23 (×2): 12.5 mg via ORAL
  Filled 2011-07-22 (×3): qty 1

## 2011-07-22 NOTE — Progress Notes (Signed)
Pt came to floor at 2100,cardiology was notified at 2110 paged by secretary. I wascalled back on my phone [phone].  I spoke to a female, did not understand her name, informed her that patient had arrived in 3312. She asked who the admitting doctor was, I told her Dr. Antoine Poche, She confirmed

## 2011-07-22 NOTE — H&P (Signed)
Cardiology History and Physical   History of Present Illness (and review of medical records): Marissa [name] is a 78 y.o. female with history of chronic atrial fibrillation on rate control and anticoagulation followed by Dr. Daleen Squibb, who presents for further management after presenting to [facility] with shortness of breath and ground level falls.  She reports shortness of breath worse than baseline since Wednesday.  She had any PND or orthopnea or lower extremity edema.  She denied any palpitations or chest pain.  She does report two ground level falls over past two days.  She has arthritis and usually ambulates with cane.  She states he has felt dizzy, however, denied any loss of consciousness.  Upon presentation to [facility], she was found to be in Atrial Fibrillation with RVR.    Review of Systems She reports subjective fever, chills, and non productive cough.  She denied any change in urine output.  She has chronic pain from arthritis exacerbated by recent falls.  Per patient and family, may have been down for at least 9hrs after last fall.  A comprehensive review of systems was negative other than stated in HPI.  Patient Active Problem List  Diagnoses Date Noted  . Congestive heart failure (CHF) 07/22/2011  . Atrial fibrillation with RVR 07/22/2011  . Incisional hernia 04/04/2011  . Dysuria-frequency syndrome 11/09/2010  . Long term current use of anticoagulant 10/07/2010  . ATRIAL FIBRILLATION 08/26/2009  . EDEMA 08/26/2009  . NEOPLASM UNSPECIFIED NATURE DIGESTIVE SYSTEM 03/06/2008   Past Medical History  Diagnosis Date  . Edema   . Atrial fibrillation   . Neoplasm     unspecifide nature digestive system  . MVP (mitral valve prolapse)   . Hip fracture     left  . Hypertension     Past Surgical History  Procedure Date  . Appendectomy   . Abdominal hysterectomy   . Hernia repair 09/2008  . Hernia repair 04/17/11    lap ventral incisional hernia repair with mesh       Prescriptions prior to admission  Medication Sig Dispense Refill  . acetaminophen (TYLENOL) 500 MG tablet Take 500-1,000 mg by mouth every 6 (six) hours as needed. Pain      . alendronate (FOSAMAX) 70 MG tablet Take 70 mg by mouth every 7 (seven) days. Take with a full glass of water on an empty stomach.       Marland Kitchen allopurinol (ZYLOPRIM) 100 MG tablet Take 200 mg by mouth daily.       Marland Kitchen COUMADIN 5 MG tablet TAKE 1 TABLET BY MOUTH EVERY DAY , EXCEPT TAKE  1/2 TABLET DAILY ON MONDAYS AND THURSDAYS  60 tablet  0  . digoxin (LANOXIN) 0.125 MG tablet Take 125 mcg by mouth daily.        . febuxostat (ULORIC) 40 MG tablet Take 40 mg by mouth daily.      . folic acid (FOLVITE) 1 MG tablet Take 1 mg by mouth daily.      . furosemide (LASIX) 40 MG tablet Take 40 mg by mouth daily.      Marland Kitchen gabapentin (NEURONTIN) 300 MG capsule Take 300 mg by mouth at bedtime.      Marland Kitchen HYDROcodone-acetaminophen (VICODIN) 5-500 MG per tablet Take 1 tablet by mouth every 6 (six) hours as needed.        . hydroxychloroquine (PLAQUENIL) 200 MG tablet Take 200 mg by mouth daily.      Marland Kitchen losartan-hydrochlorothiazide (HYZAAR) 50-12.5 MG per tablet  TAKE ONE TABLET BY MOUTH DAILY  30 tablet  6  . metoprolol (TOPROL-XL) 50 MG 24 hr tablet TAKE  1/2 TABLET BY MOUTH TWICE DAILY  30 tablet  9  . predniSONE (DELTASONE) 5 MG tablet Take 5 mg by mouth daily.       Allergies  Allergen Reactions  . Codeine Other (See Comments)    Makes patient sleepy.  Marland Kitchen Penicillins Rash    History  Substance Use Topics  . Smoking status: Never Smoker   . Smokeless tobacco: Never Used  . Alcohol Use: Yes     rare    History reviewed. No pertinent family history.   Objective:  Patient Vitals for the past 8 hrs:  BP Temp Temp src Pulse Resp SpO2 Height Weight  07/21/11 2100 112/74 mmHg 98.5 F (36.9 C) Oral 88  26  92 % 5\' 5"  (1.651 m) 83.4 kg (183 lb 13.8 oz)  07/21/11 1941 111/57 mmHg 98.7 F (37.1 C) Oral 89  20  95 % - -  07/21/11 1825 - 99.6  F (37.6 C) - - - - - -  07/21/11 1639 110/60 mmHg - - 96  - 94 % - -   General Appearance:    Alert, cooperative, no distress  Head:    Normocephalic, without obvious abnormality, atraumatic  Eyes:     PERRL, EOMI, anicteric sclerae  Neck:   Supple, no carotid bruit or JVD  Lungs:     Rales at bases bilaterally, no wheezing  Heart:    Regular rate and rhythm, S1 and S2 normal, no murmur, rub   or gallop  Abdomen:     Soft, non-tender, normoactive bowel sounds  Extremities:   Extremities normal, atraumatic, no cyanosis or edema  Pulses:   2+ and symmetric all extremities  Skin:   no rashes or lesions  Neurologic:   No focal deficits. AAO x3   Results for orders placed during the hospital encounter of 07/21/11 (from the past 48 hour(s))  CBC     Status: Normal   Collection Time   07/21/11  1:37 PM      Component Value Range Comment   WBC 7.9  4.0 - 10.5 (K/uL)    RBC 4.76  3.87 - 5.11 (MIL/uL)    Hemoglobin 13.4  12.0 - 15.0 (g/dL)    HCT 14.7  82.9 - 56.2 (%)    MCV 91.4  78.0 - 100.0 (fL)    MCH 28.2  26.0 - 34.0 (pg)    MCHC 30.8  30.0 - 36.0 (g/dL)    RDW 13.0  86.5 - 78.4 (%)    Platelets 179  150 - 400 (K/uL)   BASIC METABOLIC PANEL     Status: Abnormal   Collection Time   07/21/11  1:37 PM      Component Value Range Comment   Sodium 142  135 - 145 (mEq/L)    Potassium 3.5  3.5 - 5.1 (mEq/L)    Chloride 101  96 - 112 (mEq/L)    CO2 29  19 - 32 (mEq/L)    Glucose, Bld 172 (*) 70 - 99 (mg/dL)    BUN 23  6 - 23 (mg/dL)    Creatinine, Ser 6.96  0.50 - 1.10 (mg/dL)    Calcium 9.7  8.4 - 10.5 (mg/dL)    GFR calc non Af Amer 51 (*) >90 (mL/min)    GFR calc Af Amer 60 (*) >90 (mL/min)   PROTIME-INR  Status: Abnormal   Collection Time   07/21/11  1:37 PM      Component Value Range Comment   Prothrombin Time 19.8 (*) 11.6 - 15.2 (seconds)    INR 1.65 (*) 0.00 - 1.49    DIGOXIN LEVEL     Status: Abnormal   Collection Time   07/21/11  1:37 PM      Component Value Range  Comment   Digoxin Level 0.7 (*) 0.8 - 2.0 (ng/mL)   PRO B NATRIURETIC PEPTIDE     Status: Abnormal   Collection Time   07/21/11  2:11 PM      Component Value Range Comment   Pro B Natriuretic peptide (BNP) 10673.0 (*) 0 - 450 (pg/mL)   CK     Status: Abnormal   Collection Time   07/21/11  2:20 PM      Component Value Range Comment   Total CK 479 (*) 7 - 177 (U/L)   POCT I-STAT TROPONIN I     Status: Abnormal   Collection Time   07/21/11  2:33 PM      Component Value Range Comment   Troponin i, poc 5.12 (*) 0.00 - 0.08 (ng/mL)    Comment 3            URINALYSIS, ROUTINE W REFLEX MICROSCOPIC     Status: Abnormal   Collection Time   07/21/11  2:37 PM      Component Value Range Comment   Color, Urine YELLOW  YELLOW     APPearance CLOUDY (*) CLEAR     Specific Gravity, Urine >1.030 (*) 1.005 - 1.030     pH 6.0  5.0 - 8.0     Glucose, UA NEGATIVE  NEGATIVE (mg/dL)    Hgb urine dipstick LARGE (*) NEGATIVE     Bilirubin Urine NEGATIVE  NEGATIVE     Ketones, ur NEGATIVE  NEGATIVE (mg/dL)    Protein, ur >952 (*) NEGATIVE (mg/dL)    Urobilinogen, UA 1.0  0.0 - 1.0 (mg/dL)    Nitrite NEGATIVE  NEGATIVE     Leukocytes, UA NEGATIVE  NEGATIVE    URINE MICROSCOPIC-ADD ON     Status: Abnormal   Collection Time   07/21/11  2:37 PM      Component Value Range Comment   WBC, UA 11-20  <3 (WBC/hpf)    RBC / HPF 3-6  <3 (RBC/hpf)    Bacteria, UA MANY (*) RARE    CULTURE, BLOOD (ROUTINE X 2)     Status: Normal (Preliminary result)   Collection Time   07/21/11  4:04 PM      Component Value Range Comment   Specimen Description BLOOD LEFT ARM      Special Requests BOTTLES DRAWN AEROBIC AND ANAEROBIC  4CC EACH      Culture PENDING      Report Status PENDING     CULTURE, BLOOD (ROUTINE X 2)     Status: Normal (Preliminary result)   Collection Time   07/21/11  4:20 PM      Component Value Range Comment   Specimen Description BLOOD LEFT ARM      Special Requests BOTTLES DRAWN AEROBIC AND ANAEROBIC  6  CC EACH      Culture PENDING      Report Status PENDING      Dg Lumbar Spine Complete  07/21/2011  *RADIOLOGY REPORT*  Clinical Data: Status post fall.  Low back pain radiating into the left hip.  LUMBAR SPINE - COMPLETE  4+ VIEW  Comparison: Two views lumbar spine 02/14/2011.  Findings: Convex left scoliosis and multilevel degenerative disease are again seen.  The patient has a compression fracture deformity of L4, unchanged.  No new fracture is identified.  IMPRESSION: No acute finding.  Degenerative disease and old L4 compression fracture.  Original Report Authenticated By: Bernadene Bell. Maricela Curet, M.D.   Ct Head Wo Contrast  07/21/2011  *RADIOLOGY REPORT*  Clinical Data: History of fall.  Altered mental status.  CT HEAD WITHOUT CONTRAST  Technique:  Contiguous axial images were obtained from the base of the skull through the vertex without contrast.  Comparison: None.  Findings: No acute displaced skull fracture identified.  Mastoids are well aerated.  No acute intracranial abnormality. Specifically, no definite evidence of ischemia, mass, mass effect, hydrocephalus or abnormal intra or extra-axial fluid collections.  Mild cerebral atrophy is evident.  Physiologic calcifications are noted in the basal ganglia bilaterally.  Prominent calcifications are also noted along the falx.  Mild thickening of the ethmoid sinuses is noted. No air fluid levels are seen within the sinuses.  IMPRESSION:  1.  No displaced skull fracture or acute intracranial abnormality identified. 2.  Mild cerebral atrophy.  Original Report Authenticated By: Florencia Reasons, M.D.   Dg Chest Portable 1 View  07/21/2011  *RADIOLOGY REPORT*  Clinical Data: Cough, shortness of breath, fever.  PORTABLE CHEST - 1 VIEW  Comparison: 07/21/2011  Findings: There is cardiomegaly.  Bibasilar atelectasis.  No overt edema.  No effusions or acute bony abnormality.  IMPRESSION: Cardiomegaly.  Bibasilar atelectasis.  Original Report Authenticated By:  Cyndie Chime, M.D.   Dg Chest Portable 1 View  07/21/2011  *RADIOLOGY REPORT*  Clinical Data: Shortness of breath.  PORTABLE CHEST - 1 VIEW  Comparison: Chest x-ray 12/17/2007, chest CT 12/21/2010  Findings: Heart is enlarged.  There is interstitial pulmonary edema.  No focal consolidations or definite pleural effusions are present.  IMPRESSION: Cardiomegaly.  Mild interstitial edema.  Original Report Authenticated By: Patterson Hammersmith, M.D.    ECG:  Atrial fibrillation with RVR HR 123, RAD, nonspecific ST-T changes  Assessment: Dyspnea suspect secondary to congestive heart failure Atrial Fibrillation with RVR Troponin elevation, nonspecific Ground level fall x 2   Plan:  1. Admit to Cardiology, Telemetry unit. 2. IV Lasix 40 x1 in am, strict I/Os, daily weights, 2gm Na diet, 2L fluid restriction 3. TTE in am assess LV function, wall motion or any valvular abnormality 4. Continue Dilt gtt wean as tolerated, Restart home BB, Digoxin 5. Restart coumadin tomorrow, INR subtherapeutic 6. Trend enzymes 7. PT/OT

## 2011-07-22 NOTE — Progress Notes (Signed)
Physical Therapy Evaluation Patient Details Name: Marissa [name] MRN: [id number] DOB: 07-Mar-1933 Today's Date: 07/22/2011  Problem List:  Patient Active Problem List  Diagnoses  . NEOPLASM UNSPECIFIED NATURE DIGESTIVE SYSTEM  . ATRIAL FIBRILLATION  . EDEMA  . Long term current use of anticoagulant  . Dysuria-frequency syndrome  . Incisional hernia  . Congestive heart failure (CHF)  . Atrial fibrillation with RVR  . NSTEMI (non-ST elevated myocardial infarction)    Past Medical History:  Past Medical History  Diagnosis Date  . Edema   . Atrial fibrillation   . Neoplasm     unspecifide nature digestive system  . MVP (mitral valve prolapse)   . Hip fracture     left  . Hypertension    Past Surgical History:  Past Surgical History  Procedure Date  . Appendectomy   . Abdominal hysterectomy   . Hernia repair 09/2008  . Hernia repair 04/17/11    lap ventral incisional hernia repair with mesh     PT Assessment/Plan/Recommendation PT Assessment Clinical Impression Statement: Patient presents with Afib with RVR and NSTEMI - troponins trending down. Patient will require PT in the acute care setting for maximizing independent function so patient can return home alone. Per family patient had flu recently resulting in decreased PO intake and some generalized weakness pre-cardiac issues PT Recommendation/Assessment: Patient will need skilled PT in the acute care venue PT Problem List: Decreased strength;Decreased activity tolerance;Decreased balance;Decreased mobility;Decreased knowledge of use of DME Barriers to Discharge: Decreased caregiver support PT Therapy Diagnosis : Difficulty walking PT Plan PT Frequency: Min 3X/week PT Treatment/Interventions: DME instruction;Gait training;Stair training;Therapeutic activities;Therapeutic exercise;Balance training;Patient/family education PT Recommendation Follow Up Recommendations: Home health PT Equipment Recommended: None recommended  by PT PT Goals  Acute Rehab PT Goals PT Goal Formulation: With patient Time For Goal Achievement: 7 days Pt will go Supine/Side to Sit: Independently PT Goal: Supine/Side to Sit - Progress: Not met Pt will go Sit to Supine/Side: Independently PT Goal: Sit to Supine/Side - Progress: Not met Pt will go Sit to Stand: with modified independence PT Goal: Sit to Stand - Progress: Not met Pt will go Stand to Sit: with modified independence PT Goal: Stand to Sit - Progress: Not met Pt will Ambulate: >150 feet;with modified independence;with least restrictive assistive device PT Goal: Ambulate - Progress: Not met Pt will Go Up / Down Stairs: 3-5 stairs;with supervision;with rail(s) PT Goal: Up/Down Stairs - Progress: Not met  PT Evaluation Precautions/Restrictions  Precautions Precautions: Fall Prior Functioning  Home Living Lives With: Alone Receives Help From: Family Type of Home: House Home Layout: One level Home Access: Stairs to enter Entrance Stairs-Rails: Left Entrance Stairs-Number of Steps: 3 Bathroom Shower/Tub: Health visitor: Standard Home Adaptive Equipment: Shower chair with back;Walker - rolling;Straight cane;Raised toilet seat with rails Prior Function Level of Independence: Independent with basic ADLs;Independent with homemaking with ambulation Driving: No Vocation: Retired Producer, television/film/video: Awake/alert Overall Cognitive Status: Appears within functional limits for tasks assessed Sensation/Coordination Sensation Light Touch: Appears Intact Proprioception: Appears Intact Coordination Gross Motor Movements are Fluid and Coordinated: Yes Fine Motor Movements are Fluid and Coordinated: Yes Extremity Assessment RLE Assessment RLE Assessment: Exceptions to [facility] RLE AROM (degrees) Overall AROM Right Lower Extremity: Deficits;Due to premorbid status RLE Strength RLE Overall Strength: Due to premorbid status;Deficits RLE  Overall Strength Comments: Secondary to prior surgeries - hip and knee 4/5 LLE Assessment LLE Assessment: Within Functional Limits Mobility (including Balance) Bed Mobility Bed Mobility: Yes Sit to  Supine: 5: Supervision Sit to Supine - Details (indicate cue type and reason): Increased time and cues to reposition in center of bed Transfers Transfers: Yes Sit to Stand: 4: Min assist;With upper extremity assist;From chair/3-in-1 Sit to Stand Details (indicate cue type and reason): Verbal cues for hand placement. Increased time to achieve full standing position Stand to Sit: 5: Supervision;With upper extremity assist;To bed Stand to Sit Details: Good control of descent Ambulation/Gait Ambulation/Gait: Yes Ambulation/Gait Assistance: 4: Min assist Ambulation/Gait Assistance Details (indicate cue type and reason): Min-guard assistance Ambulation Distance (Feet): 50 Feet Assistive device: Rolling walker Gait Pattern: Step-to pattern;Decreased stride length;Decreased stance time - left;Trunk flexed  Static Standing Balance Static Standing - Balance Support: Bilateral upper extremity supported Static Standing - Level of Assistance: 5: Stand by assistance End of Session PT - End of Session Equipment Utilized During Treatment: Gait belt Activity Tolerance: Patient tolerated treatment well Patient left: in bed;with call bell in reach;with family/visitor present Nurse Communication: Mobility status for ambulation;Mobility status for transfers General Behavior During Session: [facility] for tasks performed Cognition: [facility] for tasks performed  Edwyna Perfect, PT  Pager [phone] 07/22/2011, 2:56 PM

## 2011-07-22 NOTE — Progress Notes (Signed)
Subjective:  [name] feels better.  No chest pain or awareness of palpitations.  Troponins elevated c/w NSTEMI type 2 secondary to laying on bathroom floor from 3 am til found at noon.  Objective:  Vital Signs in the last 24 hours: Temp:  [97.7 F (36.5 C)-102.6 F (39.2 C)] 98.9 F (37.2 C) (01/12 1115) Pulse Rate:  [81-119] 81  (01/12 1115) Resp:  [20-32] 22  (01/12 1115) BP: (99-182)/(57-109) 118/77 mmHg (01/12 1115) SpO2:  [92 %-97 %] 94 % (01/12 1115) FiO2 (%):  [0 %] 0 % (01/12 0029) Weight:  [183 lb 13.8 oz (83.4 kg)-195 lb (88.451 kg)] 183 lb 13.8 oz (83.4 kg) (01/12 0400)  Intake/Output from previous day: 01/11 0701 - 01/12 0700 In: 87.1 [I.V.:87.1] Out: 700 [Urine:700] Intake/Output from this shift: Total I/O In: 15 [I.V.:15] Out: 500 [Urine:500]     . acetaminophen  650 mg Rectal Once  . allopurinol  200 mg Oral Daily  . aspirin  325 mg Oral Daily  . digoxin  125 mcg Oral Daily  . diltiazem  10 mg Intravenous Once  . enoxaparin (LOVENOX) injection  1 mg/kg Subcutaneous Q12H  . febuxostat  40 mg Oral Daily  . folic acid  1 mg Oral Daily  . furosemide  40 mg Intravenous Once  . furosemide  60 mg Intravenous Once  . gabapentin  300 mg Oral QHS  . hydrochlorothiazide  12.5 mg Oral Daily  . hydroxychloroquine  200 mg Oral Daily  . Levofloxacin  750 mg Intravenous Once  . losartan  50 mg Oral Daily  . metoprolol succinate  25 mg Oral Daily  .  morphine injection  2 mg Intravenous Once  . predniSONE  5 mg Oral Daily  . sodium chloride  3 mL Intravenous Q12H  . DISCONTD: sodium chloride   Intravenous Once  . DISCONTD: alendronate  70 mg Oral Q7 days  . DISCONTD: losartan-hydrochlorothiazide  1 tablet Oral Daily      . diltiazem (CARDIZEM) infusion 5 mg/hr (07/22/11 0800)    Physical Exam: The patient appears to be in no distress.  Head and neck exam reveals that the pupils are equal and reactive.  The extraocular movements are full.  There is no  scleral icterus.  Mouth and pharynx are benign.  No lymphadenopathy.  No carotid bruits.  The jugular venous pressure is normal.  Thyroid is not enlarged or tender.  Chest reveals basilar rales.  Heart reveals no abnormal lift or heave.  First and second heart sounds are normal.  There is no murmur gallop rub or click.  The abdomen is soft and nontender.  Bowel sounds are normoactive.  There is no hepatosplenomegaly or mass.  There are no abdominal bruits.  Extremities reveal no phlebitis or edema.  Pedal pulses are good.  There is no cyanosis or clubbing.  Neurologic exam is normal strength and no lateralizing weakness.  No sensory deficits.  Integument reveals no rash  Lab Results:  Basename 07/21/11 1337  WBC 7.9  HGB 13.4  PLT 179    Basename 07/21/11 1337  NA 142  K 3.5  CL 101  CO2 29  GLUCOSE 172*  BUN 23  CREATININE 1.03    Basename 07/22/11 0806  TROPONINI 3.48*   Hepatic Function Panel No results found for this basename: PROT,ALBUMIN,AST,ALT,ALKPHOS,BILITOT,BILIDIR,IBILI in the last 72 hours No results found for this basename: CHOL in the last 72 hours No results found for this basename: PROTIME in the last 72 hours  Imaging: Imaging results have been reviewed.  Cardiomegaly and bibasilar atelectasis.  Cardiac Studies: Echo pending. Assessment/Plan:  Patient Active Hospital Problem List: Congestive heart failure (CHF) (07/22/2011)   Assessment: Clinically improved.   Plan: 2D echo Atrial fibrillation with RVR (07/22/2011)   Assessment: Rate improving   Plan:Continue IV cardizem today.           Resume warfarin.  Use Lovenox until INR therapeutic. NSTEMI (non-ST elevated myocardial infarction) (07/22/2011)   Assessment: Consider outpatient myoview at a later date. No chest pain history. EKG shows no ischemia.   Plan: ASA, statin, beta blocker.   LOS: 1 day    Marissa [name] 07/22/2011, 1:03 PM

## 2011-07-22 NOTE — Progress Notes (Signed)
ANTICOAGULATION CONSULT NOTE - Initial Consult  Pharmacy Consult for Coumadin Indication: atrial fibrillation  Allergies  Allergen Reactions  . Codeine Other (See Comments)    Makes patient sleepy.  Marland Kitchen Penicillins Rash    Patient Measurements: Height: 5\' 5"  (165.1 cm) Weight: 183 lb 13.8 oz (83.4 kg) IBW/kg (Calculated) : 57   Vital Signs: Temp: 98.9 F (37.2 C) (01/12 1115) Temp src: Oral (01/12 1115) BP: 118/77 mmHg (01/12 1115) Pulse Rate: 81  (01/12 1115)  Labs:  Basename 07/22/11 0806 07/22/11 0705 07/21/11 1420 07/21/11 1337  HGB -- -- -- 13.4  HCT -- -- -- 43.5  PLT -- -- -- 179  APTT -- 50* -- --  LABPROT -- 21.7* -- 19.8*  INR -- 1.85* -- 1.65*  HEPARINUNFRC -- -- -- --  CREATININE -- -- -- 1.03  CKTOTAL 422* -- 479* --  CKMB 5.0* -- -- --  TROPONINI 3.48* -- -- --   Estimated Creatinine Clearance: 49.6 ml/min (by C-G formula based on Cr of 1.03).  Medical History: Past Medical History  Diagnosis Date  . Edema   . Atrial fibrillation   . Neoplasm     unspecifide nature digestive system  . MVP (mitral valve prolapse)   . Hip fracture     left  . Hypertension     Medications:  Scheduled:    . acetaminophen  650 mg Rectal Once  . allopurinol  200 mg Oral Daily  . aspirin  325 mg Oral Daily  . digoxin  125 mcg Oral Daily  . diltiazem  10 mg Intravenous Once  . enoxaparin (LOVENOX) injection  1 mg/kg Subcutaneous Q12H  . febuxostat  40 mg Oral Daily  . folic acid  1 mg Oral Daily  . furosemide  40 mg Intravenous Once  . furosemide  60 mg Intravenous Once  . gabapentin  300 mg Oral QHS  . hydrochlorothiazide  12.5 mg Oral Daily  . hydroxychloroquine  200 mg Oral Daily  . Levofloxacin  750 mg Intravenous Once  . losartan  50 mg Oral Daily  . metoprolol succinate  25 mg Oral Daily  .  morphine injection  2 mg Intravenous Once  . predniSONE  5 mg Oral Daily  . rosuvastatin  10 mg Oral q1800  . sodium chloride  3 mL Intravenous Q12H  .  DISCONTD: sodium chloride   Intravenous Once  . DISCONTD: alendronate  70 mg Oral Q7 days  . DISCONTD: losartan-hydrochlorothiazide  1 tablet Oral Daily    Assessment: 78 YOF on coumadin prior to admission for Afib, admitted for SOB and falls. Pharmacy is consulted to resume coumadin while being admitted. INR 1.85, home coumadin dose 5mg  daily except for 2.5mg  on Mondays and Thursdays, currently also on full dose lovenox, cbc stable, no bleeding noted per chart.  Goal of Therapy:  INR 2-3   Plan:  - Coumadin 5mg  po x 1 - f/u INR tomorrow am, will d/c lovenox when INR > 2  Bayard Hugger Wang 07/22/2011,1:27 PM

## 2011-07-22 NOTE — Plan of Care (Signed)
Problem: Phase I Progression Outcomes Goal: Voiding-avoid urinary catheter unless indicated Foley catheter placed 07/21/2011

## 2011-07-22 NOTE — Progress Notes (Signed)
CRITICAL VALUE ALERT  Critical value received:  Tropornin I - 3.48  Date of notification:  07/22/2011  Time of notification:  0930  Critical value read back:yes   Nurse who received alert:  Raymon Mutton RN  MD notified (1st page):  Bailey Mech   Time of first page:  0945  MD notified (2nd page):  Time of second page:  Responding MD:  Bailey Mech   Time MD responded:  [phone]

## 2011-07-23 ENCOUNTER — Inpatient Hospital Stay (HOSPITAL_COMMUNITY): Payer: Medicare Other

## 2011-07-23 DIAGNOSIS — I509 Heart failure, unspecified: Secondary | ICD-10-CM

## 2011-07-23 DIAGNOSIS — I369 Nonrheumatic tricuspid valve disorder, unspecified: Secondary | ICD-10-CM

## 2011-07-23 LAB — PROTIME-INR: Prothrombin Time: 22.8 seconds — ABNORMAL HIGH (ref 11.6–15.2)

## 2011-07-23 LAB — COMPREHENSIVE METABOLIC PANEL
BUN: 29 mg/dL — ABNORMAL HIGH (ref 6–23)
Calcium: 8.9 mg/dL (ref 8.4–10.5)
GFR calc Af Amer: 46 mL/min — ABNORMAL LOW (ref >90)
GFR calc non Af Amer: 40 mL/min — ABNORMAL LOW (ref >90)
Glucose, Bld: 90 mg/dL (ref 70–99)
Total Protein: 5.9 g/dL — ABNORMAL LOW (ref 6.0–8.3)

## 2011-07-23 LAB — CARDIAC PANEL(CRET KIN+CKTOT+MB+TROPI)
CK, MB: 9.7 ng/mL (ref 0.3–4.0)
CK, MB: 3.4 ng/mL (ref 0.3–4.0)
Relative Index: INVALID (ref 0.0–2.5)
Relative Index: 1.6 (ref 0.0–2.5)
Troponin I: 2.24 ng/mL (ref <0.30)
Troponin I: 1.75 ng/mL (ref <0.30)

## 2011-07-23 LAB — CBC
HCT: 38.3 % (ref 36.0–46.0)
Hemoglobin: 12 g/dL (ref 12.0–15.0)
MCH: 28.4 pg (ref 26.0–34.0)
MCHC: 31.3 g/dL (ref 30.0–36.0)
MCV: 90.8 fL (ref 78.0–100.0)

## 2011-07-23 LAB — LIPID PANEL
Cholesterol: 132 mg/dL (ref 0–200)
Cholesterol: 132 mg/dL (ref 0–200)
LDL Cholesterol: 66 mg/dL (ref 0–99)
Triglycerides: 70 mg/dL (ref <150)
Triglycerides: 83 mg/dL (ref <150)

## 2011-07-23 MED ORDER — ASPIRIN 81 MG PO CHEW
324.0000 mg | CHEWABLE_TABLET | ORAL | Status: AC
  Administered 2011-07-24: 324 mg via ORAL
  Filled 2011-07-23: qty 4

## 2011-07-23 MED ORDER — SODIUM CHLORIDE 0.9 % IV SOLN: 250.0000 mL | INTRAVENOUS | Status: DC | PRN

## 2011-07-23 MED ORDER — ENOXAPARIN SODIUM 100 MG/ML ~~LOC~~ SOLN
1.0000 mg/kg | Freq: Two times a day (BID) | SUBCUTANEOUS | Status: DC
  Administered 2011-07-23: 22:00:00 via SUBCUTANEOUS
  Administered 2011-07-24 – 2011-07-25 (×3): 85 mg via SUBCUTANEOUS
  Filled 2011-07-23 (×7): qty 1

## 2011-07-23 MED ORDER — POTASSIUM CHLORIDE CRYS ER 20 MEQ PO TBCR
20.0000 meq | EXTENDED_RELEASE_TABLET | Freq: Three times a day (TID) | ORAL | Status: DC
  Administered 2011-07-23 – 2011-07-26 (×10): 20 meq via ORAL
  Filled 2011-07-23 (×12): qty 1

## 2011-07-23 MED ORDER — WARFARIN SODIUM 5 MG PO TABS
5.0000 mg | ORAL_TABLET | Freq: Once | ORAL | Status: DC
  Filled 2011-07-23: qty 1

## 2011-07-23 MED ORDER — FUROSEMIDE 20 MG PO TABS
20.0000 mg | ORAL_TABLET | Freq: Every day | ORAL | Status: DC
  Administered 2011-07-24 – 2011-07-26 (×3): 20 mg via ORAL
  Filled 2011-07-23 (×3): qty 1

## 2011-07-23 MED ORDER — SODIUM CHLORIDE 0.9 % IJ SOLN: 3.0000 mL | INTRAMUSCULAR | Status: DC | PRN

## 2011-07-23 MED ORDER — METOPROLOL SUCCINATE ER 50 MG PO TB24
50.0000 mg | ORAL_TABLET | Freq: Every day | ORAL | Status: DC
  Administered 2011-07-24 – 2011-07-26 (×3): 50 mg via ORAL
  Filled 2011-07-23 (×3): qty 1

## 2011-07-23 MED ORDER — SODIUM CHLORIDE 0.9 % IJ SOLN: 3.0000 mL | Freq: Two times a day (BID) | INTRAMUSCULAR | Status: DC

## 2011-07-23 NOTE — Progress Notes (Signed)
  Echocardiogram 2D Echocardiogram has been performed.  Juanita Laster Caretha Rumbaugh 07/23/2011, 8:35 AM

## 2011-07-23 NOTE — Progress Notes (Signed)
ANTICOAGULATION CONSULT NOTE - Follow Up Consult  Pharmacy Consult for Coumadin  Indication: atrial fibrillation  Assessment: 69 YOF admitted for SOB and falls. Pt was on coumadin prior to admission for Afib (home coumadin dose 5mg  daily except for 2.5mg  on Mondays and Thursdays), INR slightly subtherapeutic this morning = 1.97, currently she is also on full dose lovenox, cbc stable, no bleeding noted per chart.  Goal of Therapy:  INR 2-3   Plan:  - Coumadin 5mg  po x 1  - f/u INR tomorrow am, will d/c lovenox when INR > 2   Riki Rusk 07/23/2011,12:45 PM   Allergies  Allergen Reactions  . Codeine Other (See Comments)    Makes patient sleepy.  Marland Kitchen Penicillins Rash    Patient Measurements: Height: 5\' 5"  (165.1 cm) Weight: 183 lb 13.8 oz (83.4 kg) IBW/kg (Calculated) : 57   Vital Signs: Temp: 98.7 F (37.1 C) (01/13 1159) Temp src: Oral (01/13 1159) BP: 111/65 mmHg (01/13 1159) Pulse Rate: 58  (01/13 1159)  Labs:  Basename 07/23/11 0346 07/23/11 0037 07/22/11 1601 07/22/11 0705 07/21/11 1337  HGB 12.0 -- -- -- 13.4  HCT 38.3 -- -- -- 43.5  PLT 161 -- -- -- 179  APTT -- -- -- 50* --  LABPROT 22.8* -- -- 21.7* 19.8*  INR 1.97* -- -- 1.85* 1.65*  HEPARINUNFRC -- -- -- -- --  CREATININE 1.28* -- -- -- 1.03  CKTOTAL 208* 77 323* -- --  CKMB 3.4 9.7* 4.0 -- --  TROPONINI 1.75* 2.24* 2.26* -- --   Estimated Creatinine Clearance: 39.9 ml/min (by C-G formula based on Cr of 1.28).   Medications:  Scheduled:    . allopurinol  200 mg Oral Daily  . aspirin  325 mg Oral Daily  . digoxin  125 mcg Oral Daily  . enoxaparin (LOVENOX) injection  1 mg/kg Subcutaneous Q12H  . febuxostat  40 mg Oral Daily  . folic acid  1 mg Oral Daily  . furosemide  40 mg Intravenous Once  . furosemide  20 mg Oral Daily  . gabapentin  300 mg Oral QHS  . hydrochlorothiazide  12.5 mg Oral Daily  . hydroxychloroquine  200 mg Oral Daily  . losartan  50 mg Oral Daily  . metoprolol  succinate  50 mg Oral Daily  . potassium chloride  20 mEq Oral TID  . predniSONE  5 mg Oral Daily  . rosuvastatin  10 mg Oral q1800  . sodium chloride  3 mL Intravenous Q12H  . warfarin  5 mg Oral ONCE-1800  . DISCONTD: metoprolol succinate  25 mg Oral Daily

## 2011-07-23 NOTE — Progress Notes (Signed)
Subjective:  [name] feels better.  No chest pain or awareness of palpitations.  Troponins elevated c/w NSTEMI type 2 secondary to laying on bathroom floor from 3 am til found at noon.  Objective:  Vital Signs in the last 24 hours: Temp:  [98 F (36.7 C)-99.5 F (37.5 C)] 98.6 F (37 C) (01/13 0734) Pulse Rate:  [70-81] 73  (01/13 0734) Resp:  [17-27] 17  (01/13 0734) BP: (107-128)/(51-78) 114/63 mmHg (01/13 0734) SpO2:  [94 %-98 %] 96 % (01/13 0734) Weight:  [183 lb 13.8 oz (83.4 kg)] 183 lb 13.8 oz (83.4 kg) (01/13 0500)  Intake/Output from previous day: 01/12 0701 - 01/13 0700 In: 120 [I.V.:120] Out: 600 [Urine:600] Intake/Output from this shift: Total I/O In: -  Out: 400 [Urine:400]     . allopurinol  200 mg Oral Daily  . aspirin  325 mg Oral Daily  . digoxin  125 mcg Oral Daily  . enoxaparin (LOVENOX) injection  1 mg/kg Subcutaneous Q12H  . febuxostat  40 mg Oral Daily  . folic acid  1 mg Oral Daily  . furosemide  40 mg Intravenous Once  . gabapentin  300 mg Oral QHS  . hydrochlorothiazide  12.5 mg Oral Daily  . hydroxychloroquine  200 mg Oral Daily  . losartan  50 mg Oral Daily  . metoprolol succinate  25 mg Oral Daily  . predniSONE  5 mg Oral Daily  . rosuvastatin  10 mg Oral q1800  . sodium chloride  3 mL Intravenous Q12H  . warfarin  5 mg Oral ONCE-1800      . diltiazem (CARDIZEM) infusion 5 mg/hr (07/23/11 0800)    Physical Exam: The patient appears to be in no distress.  Head and neck exam reveals that the pupils are equal and reactive.  The extraocular movements are full.  There is no scleral icterus.  Mouth and pharynx are benign.  No lymphadenopathy.  No carotid bruits.  The jugular venous pressure is normal.  Thyroid is not enlarged or tender.  Chest reveals basilar rales.  Heart reveals no abnormal lift or heave.  First and second heart sounds are normal.  There is no murmur gallop rub or click.  The abdomen is soft and nontender.  Bowel  sounds are normoactive.  There is no hepatosplenomegaly or mass.  There are no abdominal bruits.  Extremities reveal no phlebitis or edema.  Pedal pulses are good.  There is no cyanosis or clubbing.  Neurologic exam is normal strength and no lateralizing weakness.  No sensory deficits.  Integument reveals no rash  Lab Results:  Basename 07/23/11 0346 07/21/11 1337  WBC 4.2 7.9  HGB 12.0 13.4  PLT 161 179    Basename 07/23/11 0346 07/21/11 1337  NA 141 142  K 2.9* 3.5  CL 103 101  CO2 29 29  GLUCOSE 90 172*  BUN 29* 23  CREATININE 1.28* 1.03    Basename 07/23/11 0346 07/23/11 0037  TROPONINI 1.75* 2.24*   Hepatic Function Panel  Basename 07/23/11 0346  PROT 5.9*  ALBUMIN 2.7*  AST 50*  ALT 27  ALKPHOS 35*  BILITOT 0.4  BILIDIR --  IBILI --    Basename 07/23/11 0346  CHOL 132   No results found for this basename: PROTIME in the last 72 hours  Imaging: Imaging results have been reviewed.  Cardiomegaly and bibasilar atelectasis.  Cardiac Studies: Echo pending. Assessment/Plan:  Patient Active Hospital Problem List: Congestive heart failure (CHF) (07/22/2011)   Assessment: Clinically improved.  Plan: 2D echo--done this am--results pending.   Atrial fibrillation with RVR (07/22/2011)   Assessment: Rate improving   Plan:DC IV cardizem.  Resume home dose of Toprol 50 mg one daily.  Resume home dose of Lasix 20 mg daily           Resume warfarin.  Use Lovenox until INR therapeutic. NSTEMI (non-ST elevated myocardial infarction) (07/22/2011)   Assessment: Consider outpatient myoview at a later date. No chest pain history. EKG shows no ischemia.  Troponins trending down.  EKG shows new lateral T wave abnormalities which may be exacerbated by her low K.   Plan: ASA, statin, beta blocker. Hypokalemia    Plan: replete K Activity:     Walk in hall with assistance today     Possible home in am if stable.   LOS: 2 days    Cassell Clement 07/23/2011, 10:21  AM

## 2011-07-23 NOTE — Progress Notes (Signed)
Subjective:  No chest pain.  Echo shows significant LV systolic dysfunction with EF only 35%.  She has aortic valve sclerosis without significant stenosis.  Objective:  Vital Signs in the last 24 hours: Temp:  [98 F (36.7 C)-99.5 F (37.5 C)] 98.7 F (37.1 C) (01/13 1159) Pulse Rate:  [58-75] 58  (01/13 1159) Resp:  [17-27] 19  (01/13 1159) BP: (111-128)/(51-78) 111/65 mmHg (01/13 1159) SpO2:  [96 %-98 %] 98 % (01/13 1159) Weight:  [183 lb 13.8 oz (83.4 kg)] 183 lb 13.8 oz (83.4 kg) (01/13 0500)  Intake/Output from previous day: 01/12 0701 - 01/13 0700 In: 120 [I.V.:120] Out: 600 [Urine:600] Intake/Output from this shift: Total I/O In: -  Out: 1000 [Urine:1000]     . allopurinol  200 mg Oral Daily  . aspirin  325 mg Oral Daily  . digoxin  125 mcg Oral Daily  . enoxaparin (LOVENOX) injection  1 mg/kg Subcutaneous Q12H  . febuxostat  40 mg Oral Daily  . folic acid  1 mg Oral Daily  . furosemide  40 mg Intravenous Once  . furosemide  20 mg Oral Daily  . gabapentin  300 mg Oral QHS  . hydrochlorothiazide  12.5 mg Oral Daily  . hydroxychloroquine  200 mg Oral Daily  . losartan  50 mg Oral Daily  . metoprolol succinate  50 mg Oral Daily  . potassium chloride  20 mEq Oral TID  . predniSONE  5 mg Oral Daily  . rosuvastatin  10 mg Oral q1800  . sodium chloride  3 mL Intravenous Q12H  . warfarin  5 mg Oral ONCE-1800  . DISCONTD: metoprolol succinate  25 mg Oral Daily  . DISCONTD: sodium chloride  3 mL Intravenous Q12H  . DISCONTD: warfarin  5 mg Oral ONCE-1800      . DISCONTD: diltiazem (CARDIZEM) infusion 5 mg/hr (07/23/11 0800)    Physical Exam: The patient appears to be in no distress.  Head and neck exam reveals that the pupils are equal and reactive.  The extraocular movements are full.  There is no scleral icterus.  Mouth and pharynx are benign.  No lymphadenopathy.  No carotid bruits.  The jugular venous pressure is normal.  Thyroid is not enlarged or  tender.  Chest reveals basilar rales.  Heart reveals no abnormal lift or heave.  First and second heart sounds are normal.  There is no murmur gallop rub or click.  The abdomen is soft and nontender.  Bowel sounds are normoactive.  There is no hepatosplenomegaly or mass.  There are no abdominal bruits.  Extremities reveal no phlebitis or edema.  Pedal pulses are good.  There is no cyanosis or clubbing.  Neurologic exam is normal strength and no lateralizing weakness.  No sensory deficits.  Integument reveals no rash  Lab Results:  Basename 07/23/11 0346 07/21/11 1337  WBC 4.2 7.9  HGB 12.0 13.4  PLT 161 179    Basename 07/23/11 0346 07/21/11 1337  NA 141 142  K 2.9* 3.5  CL 103 101  CO2 29 29  GLUCOSE 90 172*  BUN 29* 23  CREATININE 1.28* 1.03    Basename 07/23/11 0346 07/23/11 0037  TROPONINI 1.75* 2.24*   Hepatic Function Panel  Basename 07/23/11 0346  PROT 5.9*  ALBUMIN 2.7*  AST 50*  ALT 27  ALKPHOS 35*  BILITOT 0.4  BILIDIR --  IBILI --    Basename 07/23/11 0346  CHOL 132   No results found for this basename: PROTIME  in the last 72 hours  Imaging: Imaging results have been reviewed.  Cardiomegaly and bibasilar atelectasis.Repeat xray today.  Cardiac Studies: Echo EF 35% Assessment/Plan:  Patient Active Hospital Problem List: Congestive heart failure (CHF) (07/22/2011)   Assessment: Clinically improved.   Plan: 2D echo--shows EF 35% Atrial fibrillation with RVR (07/22/2011)   Assessment: Rate improving   Plan:DC IV cardizem.  Resume home dose of Toprol 50 mg one daily.  Resume home dose of Lasix 20 mg daily           Resume warfarin.  Use Lovenox until INR therapeutic. Hold coumadin for cath. INR 1.97 this am.  Eat meal high in greens tonight. NSTEMI (non-ST elevated myocardial infarction) (07/22/2011)  Assessment: With EF 35% will proceed with cath this admission.  Discussed with patient and family who agree.   Plan: ASA, statin, beta  blocker. Hypokalemia    Plan: replete potassium. Activity:     Walk in hall with assistance today    Cath Monday.   LOS: 2 days    Cassell Clement 07/23/2011, 3:39 PM

## 2011-07-24 ENCOUNTER — Encounter (HOSPITAL_COMMUNITY)
Admission: AD | Disposition: A | Discharge status: Home / Self Care | Payer: Self-pay | Source: Home / Self Care | Attending: Cardiology

## 2011-07-24 DIAGNOSIS — I509 Heart failure, unspecified: Secondary | ICD-10-CM

## 2011-07-24 HISTORY — PX: LEFT HEART CATHETERIZATION WITH CORONARY ANGIOGRAM: SHX5451

## 2011-07-24 LAB — PROTIME-INR
INR: 2.01 — ABNORMAL HIGH (ref 0.00–1.49)
Prothrombin Time: 23.1 seconds — ABNORMAL HIGH (ref 11.6–15.2)

## 2011-07-24 LAB — MRSA PCR SCREENING: MRSA by PCR: NEGATIVE

## 2011-07-24 LAB — BASIC METABOLIC PANEL
Calcium: 9.3 mg/dL (ref 8.4–10.5)
GFR calc Af Amer: 45 mL/min — ABNORMAL LOW (ref >90)
GFR calc non Af Amer: 39 mL/min — ABNORMAL LOW (ref >90)
Potassium: 3.9 mEq/L (ref 3.5–5.1)
Sodium: 145 mEq/L (ref 135–145)

## 2011-07-24 SURGERY — LEFT HEART CATHETERIZATION WITH CORONARY ANGIOGRAM
Anesthesia: LOCAL

## 2011-07-24 MED ORDER — WARFARIN SODIUM 5 MG PO TABS
5.0000 mg | ORAL_TABLET | Freq: Once | ORAL | Status: AC
  Administered 2011-07-24: 5 mg via ORAL
  Filled 2011-07-24: qty 1

## 2011-07-24 MED ORDER — VERAPAMIL HCL 2.5 MG/ML IV SOLN
INTRAVENOUS | Status: AC
  Filled 2011-07-24: qty 2

## 2011-07-24 MED ORDER — ACETAMINOPHEN 325 MG PO TABS: 650.0000 mg | ORAL_TABLET | ORAL | Status: DC | PRN

## 2011-07-24 MED ORDER — NITROGLYCERIN 0.2 MG/ML ON CALL CATH LAB
INTRAVENOUS | Status: AC
  Filled 2011-07-24: qty 1

## 2011-07-24 MED ORDER — SODIUM CHLORIDE 0.9 % IV SOLN: INTRAVENOUS | Status: AC

## 2011-07-24 MED ORDER — HEPARIN (PORCINE) IN NACL 2-0.9 UNIT/ML-% IJ SOLN
INTRAMUSCULAR | Status: AC
  Filled 2011-07-24: qty 2000

## 2011-07-24 MED ORDER — MIDAZOLAM HCL 2 MG/2ML IJ SOLN
INTRAMUSCULAR | Status: AC
  Filled 2011-07-24: qty 2

## 2011-07-24 MED ORDER — HEPARIN SODIUM (PORCINE) 1000 UNIT/ML IJ SOLN
INTRAMUSCULAR | Status: AC
  Filled 2011-07-24: qty 1

## 2011-07-24 MED ORDER — LIDOCAINE HCL (PF) 1 % IJ SOLN
INTRAMUSCULAR | Status: AC
  Filled 2011-07-24: qty 30

## 2011-07-24 MED ORDER — ONDANSETRON HCL 4 MG/2ML IJ SOLN: 4.0000 mg | Freq: Four times a day (QID) | INTRAMUSCULAR | Status: DC | PRN

## 2011-07-24 MED ORDER — FENTANYL CITRATE 0.05 MG/ML IJ SOLN
INTRAMUSCULAR | Status: AC
  Filled 2011-07-24: qty 2

## 2011-07-24 MED ORDER — SODIUM CHLORIDE 0.9 % IV SOLN
INTRAVENOUS | Status: DC
  Administered 2011-07-24: 06:00:00 via INTRAVENOUS

## 2011-07-24 NOTE — H&P (View-Only) (Signed)
   SUBJECTIVE:  Feels better.  Denies any SOB.  No pain.   PHYSICAL EXAM Filed Vitals:   07/24/11 0000 07/24/11 0215 07/24/11 0400 07/24/11 0500  BP: 133/76  144/83   Pulse: 72  70   Temp: 99 F (37.2 C)  98.4 F (36.9 C)   TempSrc: Oral  Oral   Resp: 17  19   Height:      Weight:  185 lb 10 oz (84.2 kg)  186 lb 1.1 oz (84.4 kg)  SpO2: 99%  100%    General:  No distress Lungs:  Clear Heart:  Irregular Abdomen:  Positive bowel sounds, no rebound no guarding Extremities:  No edema.  LABS: Lab Results  Component Value Date   CKTOTAL 208* 07/23/2011   CKMB 3.4 07/23/2011   TROPONINI 1.75* 07/23/2011   Results for orders placed during the hospital encounter of 07/21/11 (from the past 24 hour(s))  BASIC METABOLIC PANEL     Status: Abnormal   Collection Time   07/24/11  5:40 AM      Component Value Range   Sodium 145  135 - 145 (mEq/L)   Potassium 3.9  3.5 - 5.1 (mEq/L)   Chloride 107  96 - 112 (mEq/L)   CO2 30  19 - 32 (mEq/L)   Glucose, Bld 86  70 - 99 (mg/dL)   BUN 30 (*) 6 - 23 (mg/dL)   Creatinine, Ser 4.78 (*) 0.50 - 1.10 (mg/dL)   Calcium 9.3  8.4 - 29.5 (mg/dL)   GFR calc non Af Amer 39 (*) >90 (mL/min)   GFR calc Af Amer 45 (*) >90 (mL/min)  PROTIME-INR     Status: Abnormal   Collection Time   07/24/11  5:40 AM      Component Value Range   Prothrombin Time 23.1 (*) 11.6 - 15.2 (seconds)   INR 2.01 (*) 0.00 - 1.49     Intake/Output Summary (Last 24 hours) at 07/24/11 0819 Last data filed at 07/24/11 0400  Gross per 24 hour  Intake      0 ml  Output   1800 ml  Net  -1800 ml    ASSESSMENT AND PLAN:   1) Congestive heart failure (CHF) (07/22/2011):  EF 35%.  I am going to hold the Lasix and HCTZ today pending cath in the AM.  Resume in am most likely  2) Atrial fibrillation with RVR (07/22/2011):  Rate improved off of IV diltiazem  3) NSTEMI (non-ST elevated myocardial infarction) (07/22/2011):  Cath scheduled for today but INR 2.0.  I spoke with Dr. Excell Seltzer  who would do a diagnostic only radial cath.  We will check her left radial (IVs in the right).  We would then proceed with radial cath.  If not cath in the am.   Rollene Rotunda 07/24/2011 8:19 AM  d

## 2011-07-24 NOTE — Op Note (Signed)
Cardiac Catheterization Procedure Note  Name: Marissa [name] MRN: [id number] DOB: March 24, 1933  Procedure: Left Heart Cath, Selective Coronary Angiography, LV angiography  Indication: Elevated troponin in the setting of atrial fib with RVR and congestive heart   Procedural Details: The left wrist was prepped, draped, and anesthetized with 1% lidocaine. Using the modified Seldinger technique, a 5 French sheath was introduced into the right radial artery. 3 mg of verapamil was administered through the sheath, weight-based unfractionated heparin was administered intravenously. Standard Judkins catheters were used for selective coronary angiography and left ventriculography. Catheter exchanges were performed over an exchange length guidewire. There were no immediate procedural complications. A TR band was used for radial hemostasis at the completion of the procedure.  The patient was transferred to the post catheterization recovery area for further monitoring.  Procedural Findings: Hemodynamics: AO 105/58 LV 105/12  Coronary angiography: Coronary dominance: right  Left mainstem: Widely patent common divides into the LAD, intermediate branch, and left circumflex.  Left anterior descending (LAD): Tortuous vessel, widely patent to the left ventricular apex. No significant obstructive disease.  Left circumflex (LCx): Widely patent throughout no significant obstructive disease.  Right coronary artery (RCA): There is a mild narrowing in the proximal vessel at the area of tortuosity. This is clearly nonobstructive no more than 20% stenosis is present. The vessel is dominant and gives off a PDA and a posterolateral branch without significant obstruction.  Left ventriculography: There is severe LV dysfunction with LVEF estimated at 35%. There appears to be at least 2+ mitral regurgitation.  Final Conclusions:   1. Widely patent coronary arteries without significant obstructive CAD 2. Severe global LV  systolic dysfunction  Recommendations: Continue medical therapy for atrial fibrillation and CHF.  Tonny Bollman 07/24/2011, 4:46 PM

## 2011-07-24 NOTE — Progress Notes (Signed)
Physical Therapy Treatment Patient Details Name: Marissa [name] MRN: [id number] DOB: 1932-08-23 Today's Date: 07/24/2011  PT Assessment/Plan  PT - Assessment/Plan Comments on Treatment Session: Patient with improved mobility now left hip pain decreasing. Anticipate good progress and return to pre-morbid levels PT Plan: Discharge plan remains appropriate Follow Up Recommendations: Home health PT Equipment Recommended: None recommended by PT PT Goals  Acute Rehab PT Goals PT Goal: Sit to Supine/Side - Progress: Met PT Goal: Sit to Stand - Progress: Progressing toward goal PT Goal: Stand to Sit - Progress: Met PT Goal: Ambulate - Progress: Progressing toward goal  PT Treatment Precautions/Restrictions  Precautions Precautions: Fall Restrictions Weight Bearing Restrictions: No Mobility (including Balance) Bed Mobility Sit to Supine: 7: Independent Transfers Sit to Stand: 5: Supervision;With upper extremity assist;From chair/3-in-1 Sit to Stand Details (indicate cue type and reason): Improved initiation with stand, and correct hand placement. Stand to Sit: With upper extremity assist;To bed;7: Independent Stand to Sit Details: Good safety Ambulation/Gait Ambulation/Gait Assistance: 5: Supervision Ambulation/Gait Assistance Details (indicate cue type and reason): Discussed posture with dtr. and pre-morbid issues. Patient with poor trunk extension and this leads to difficulty scanning environment. Decreased weight bearing left lower extremity, but improves with increased distance. Ambulation Distance (Feet): 130 Feet Assistive device: Rolling walker Gait Pattern: Decreased stance time - left;Step-to pattern;Trunk flexed  Static Standing Balance Static Standing - Balance Support: Bilateral upper extremity supported Static Standing - Level of Assistance: 7: Independent Exercise  Other Exercises Other Exercises: Educated in ankle pumps, quad sets, and glut sets for patient to  continue indpendently hourly. End of Session PT - End of Session Equipment Utilized During Treatment: Gait belt Activity Tolerance: Patient tolerated treatment well Patient left: in bed;with call bell in reach;with family/visitor present Nurse Communication: Mobility status for ambulation General Behavior During Session: [facility] for tasks performed Cognition: [facility] for tasks performed  Edwyna Perfect, PT  Pager [phone] 07/24/2011, 11:47 AM

## 2011-07-24 NOTE — Interval H&P Note (Signed)
History and Physical Interval Note:  07/24/2011 3:59 PM  [name]  has presented today for surgery, with the diagnosis of chest pain  The various methods of treatment have been discussed with the patient and family. After consideration of risks, benefits and other options for treatment, the patient has consented to  Procedure(s): LEFT HEART CATHETERIZATION WITH CORONARY ANGIOGRAM as a surgical intervention .  The patients' history has been reviewed, patient examined, no change in status, stable for surgery.  I have reviewed the patients' chart and labs.  Questions were answered to the patient's satisfaction.     Marissa [name]

## 2011-07-24 NOTE — Progress Notes (Signed)
Occupational Therapy Evaluation Patient Details Name: Marissa [name] MRN: [id number] DOB: 1932-10-24 Today's Date: 07/24/2011  Problem List:  Patient Active Problem List  Diagnoses  . NEOPLASM UNSPECIFIED NATURE DIGESTIVE SYSTEM  . ATRIAL FIBRILLATION  . EDEMA  . Long term current use of anticoagulant  . Dysuria-frequency syndrome  . Incisional hernia  . Congestive heart failure (CHF)  . Atrial fibrillation with RVR  . NSTEMI (non-ST elevated myocardial infarction)    Past Medical History:  Past Medical History  Diagnosis Date  . Edema   . Atrial fibrillation   . Neoplasm     unspecifide nature digestive system  . MVP (mitral valve prolapse)   . Hip fracture     left  . Hypertension    Past Surgical History:  Past Surgical History  Procedure Date  . Appendectomy   . Abdominal hysterectomy   . Hernia repair 09/2008  . Hernia repair 04/17/11    lap ventral incisional hernia repair with mesh     OT Assessment/Plan/Recommendation OT Assessment Clinical Impression Statement: Pt is a 78 year old woman with baseline a-fib and hx of hip fx and sciatica.  Pt was very active prior to admission although admits to SOB on occasion.  Pt was modified independent in ambulation and ADL and continued to work part time.  Pt is now at min to supervision level, primarily impeded by decreased activity tolerance.  Will follow acutely for monitored ADL training and energy conservation education.  Do not anticipate need for OT upon return home.  Pt has good family support. OT Recommendation/Assessment: Patient will need skilled OT in the acute care venue OT Problem List: Decreased activity tolerance;Impaired balance (sitting and/or standing);Cardiopulmonary status limiting activity OT Therapy Diagnosis : Generalized weakness OT Plan OT Frequency: Min 2X/week OT Treatment/Interventions: Self-care/ADL training;Energy conservation;Patient/family education OT Recommendation Follow Up  Recommendations: No OT follow up Equipment Recommended: None recommended by OT Individuals Consulted Consulted and Agree with Results and Recommendations: Patient OT Goals Acute Rehab OT Goals OT Goal Formulation: With patient Time For Goal Achievement: 7 days ADL Goals Pt Will Perform Grooming: with modified independence;Standing at sink;Other (comment) (at least 2 activities) ADL Goal: Grooming - Progress: Not met Pt Will Perform Upper Body Bathing: with set-up;Sitting at sink ADL Goal: Upper Body Bathing - Progress: Not met Pt Will Perform Lower Body Bathing: Sitting at sink;Sit to stand from chair;with set-up ADL Goal: Lower Body Bathing - Progress: Not met Pt Will Perform Upper Body Dressing: Sitting, bed;with set-up ADL Goal: Upper Body Dressing - Progress: Not met Pt Will Perform Lower Body Dressing: with set-up;Sitting, bed;Sit to stand from bed ADL Goal: Lower Body Dressing - Progress: Not met Pt Will Transfer to Toilet: with modified independence;Ambulation;3-in-1;Other (comment) (over toilet) ADL Goal: Toilet Transfer - Progress: Not met Pt Will Perform Toileting - Clothing Manipulation: Independently;Standing ADL Goal: Toileting - Clothing Manipulation - Progress: Not met Pt Will Perform Toileting - Hygiene: Independently;Sitting on 3-in-1 or toilet ADL Goal: Toileting - Hygiene - Progress: Not met Pt Will Perform Tub/Shower Transfer: Shower transfer;Ambulation;with supervision;Shower seat with back;Grab bars ADL Goal: Web designer - Progress: Not met Miscellaneous OT Goals Miscellaneous OT Goal #1: Pt will state at least 3 energy conservation techniques for ADL. OT Goal: Miscellaneous Goal #1 - Progress: Not met  OT Evaluation Precautions/Restrictions  Precautions Precautions: Fall Restrictions Weight Bearing Restrictions: No Other Position/Activity Restrictions: Pt with chronic a fib. Prior Functioning Home Living Lives With: Alone Receives Help From:  Family Type of Home:  House Home Layout: One level Home Access: Stairs to enter Entrance Stairs-Rails: Left Entrance Stairs-Number of Steps: 3 Bathroom Shower/Tub: Health visitor: Standard Home Adaptive Equipment: Reacher;Shower chair with back;Hand-held shower hose;Other (comment);Raised toilet seat with rails;Walker - rolling (large base quad cane) Prior Function Level of Independence: Independent with basic ADLs;Independent with homemaking with ambulation;Requires assistive device for independence;Other (comment) (uses quad cane for ambulation, assist for mopping/vacuuming) Driving: Yes Vocation: Other (comment) (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 ADL ADL Eating/Feeding: Simulated;Independent Where Assessed - Eating/Feeding: Edge of bed (pt is currently NPO for procedure) Grooming: Performed;Wash/dry hands;Minimal assistance;Other (comment) (min guard) Where Assessed - Grooming: Standing at sink Upper Body Bathing: Simulated;Minimal assistance Where Assessed - Upper Body Bathing: Sitting, bed;Sit to stand from bed Lower Body Bathing: Simulated;Set up;Supervision/safety Where Assessed - Lower Body Bathing: Sit to stand from bed;Sitting, bed Upper Body Dressing: Simulated;Supervision/safety;Set up Where Assessed - Upper Body Dressing: Sitting, bed Lower Body Dressing: Performed;Set up;Supervision/safety Where Assessed - Lower Body Dressing: Sit to stand from bed;Sitting, bed Toilet Transfer: Simulated;Minimal assistance;Other (comment) (min guard) Toilet Transfer Method: Ambulating Equipment Used: Rolling walker Ambulation Related to ADLs: Pt ambulating with RW and min guard assist. ADL Comments: Pts daughter reports that pt's HR increases dramatically with ADL. Vision/Perception  Vision - History Baseline Vision: Wears glasses only for reading Patient Visual Report: No change from  baseline Cognition Cognition Arousal/Alertness: Awake/alert Overall Cognitive Status: Appears within functional limits for tasks assessed Orientation Level: Oriented X4 Cognition - Other Comments: Pt is unlikely to complain or ask for help per daughter. Sensation/Coordination Sensation Light Touch: Appears Intact Hot/Cold: Appears Intact Proprioception: Appears Intact Coordination Gross Motor Movements are Fluid and Coordinated: Yes Fine Motor Movements are Fluid and Coordinated: Yes Extremity Assessment RUE Assessment RUE Assessment: Within Functional Limits (RA changes bilat UEs) LUE Assessment LUE Assessment: Within Functional Limits Mobility  Bed Mobility Bed Mobility: Yes Sit to Supine: 7: Independent Transfers Sit to Stand: 4: Min assist;Other (comment);With upper extremity assist;From bed (min guard) Sit to Stand Details (indicate cue type and reason): Improved initiation with stand, and correct hand placement. Stand to Sit: 5: Supervision;With upper extremity assist;To bed Stand to Sit Details: Good safety End of Session OT - End of Session Equipment Utilized During Treatment: Gait belt Activity Tolerance: Patient limited by fatigue Patient left: in bed;with call bell in reach General Behavior During Session: [facility] for tasks performed Cognition: [facility] for tasks performed   Evern Bio 07/24/2011, 2:24 PM [phone]

## 2011-07-24 NOTE — Progress Notes (Signed)
   CARE MANAGEMENT NOTE 07/24/2011  Patient:  Marissa [name], Marissa [name]   Account Number:  [id number]  Date Initiated:  07/24/2011  Documentation initiated by:  GRAVES-BIGELOW,Jarid Sasso  Subjective/Objective Assessment:   Pt is tx from [facility]. In with afib with RVR. Pt plan for cath 07-23-11.     Action/Plan:   Pt was in cath lab during visit. CM will stop back by for visit @ later date. Plan for home with [facility].   Anticipated DC Date:  07/28/2011   Anticipated DC Plan:  HOME W HOME HEALTH SERVICES         Choice offered to / List presented to:             Status of service:  In process, will continue to follow Medicare Important Message given?   (If response is "NO", the following Medicare IM given date fields will be blank) Date Medicare IM given:   Date Additional Medicare IM given:    Discharge Disposition:    Per UR Regulation:    Comments:  07-24-11 1532 Tomi Bamberger, RN, BSN [phone] CM will continue to monitor for d/c disposition.

## 2011-07-24 NOTE — Progress Notes (Signed)
   SUBJECTIVE:  Feels better.  Denies any SOB.  No pain.   PHYSICAL EXAM Filed Vitals:   07/24/11 0000 07/24/11 0215 07/24/11 0400 07/24/11 0500  BP: 133/76  144/83   Pulse: 72  70   Temp: 99 F (37.2 C)  98.4 F (36.9 C)   TempSrc: Oral  Oral   Resp: 17  19   Height:      Weight:  185 lb 10 oz (84.2 kg)  186 lb 1.1 oz (84.4 kg)  SpO2: 99%  100%    General:  No distress Lungs:  Clear Heart:  Irregular Abdomen:  Positive bowel sounds, no rebound no guarding Extremities:  No edema.  LABS: Lab Results  Component Value Date   CKTOTAL 208* 07/23/2011   CKMB 3.4 07/23/2011   TROPONINI 1.75* 07/23/2011   Results for orders placed during the hospital encounter of 07/21/11 (from the past 24 hour(s))  BASIC METABOLIC PANEL     Status: Abnormal   Collection Time   07/24/11  5:40 AM      Component Value Range   Sodium 145  135 - 145 (mEq/L)   Potassium 3.9  3.5 - 5.1 (mEq/L)   Chloride 107  96 - 112 (mEq/L)   CO2 30  19 - 32 (mEq/L)   Glucose, Bld 86  70 - 99 (mg/dL)   BUN 30 (*) 6 - 23 (mg/dL)   Creatinine, Ser 1.29 (*) 0.50 - 1.10 (mg/dL)   Calcium 9.3  8.4 - 10.5 (mg/dL)   GFR calc non Af Amer 39 (*) >90 (mL/min)   GFR calc Af Amer 45 (*) >90 (mL/min)  PROTIME-INR     Status: Abnormal   Collection Time   07/24/11  5:40 AM      Component Value Range   Prothrombin Time 23.1 (*) 11.6 - 15.2 (seconds)   INR 2.01 (*) 0.00 - 1.49     Intake/Output Summary (Last 24 hours) at 07/24/11 0819 Last data filed at 07/24/11 0400  Gross per 24 hour  Intake      0 ml  Output   1800 ml  Net  -1800 ml    ASSESSMENT AND PLAN:   1) Congestive heart failure (CHF) (07/22/2011):  EF 35%.  I am going to hold the Lasix and HCTZ today pending cath in the AM.  Resume in am most likely  2) Atrial fibrillation with RVR (07/22/2011):  Rate improved off of IV diltiazem  3) NSTEMI (non-ST elevated myocardial infarction) (07/22/2011):  Cath scheduled for today but INR 2.0.  I spoke with Dr. Cooper  who would do a diagnostic only radial cath.  We will check her left radial (IVs in the right).  We would then proceed with radial cath.  If not cath in the am.   Marissa [name] 07/24/2011 8:19 AM  d 

## 2011-07-24 NOTE — Progress Notes (Signed)
ANTICOAGULATION CONSULT NOTE - Initial Consult  Pharmacy Consult for Coumadin Indication: atrial fibrillation  Allergies  Allergen Reactions  . Codeine Other (See Comments)    Makes patient sleepy.  Marland Kitchen Penicillins Rash    Patient Measurements: Height: 5\' 5"  (165.1 cm) Weight: 186 lb 1.1 oz (84.4 kg) IBW/kg (Calculated) : 57  Adjusted Body Weight:    Vital Signs: Temp: 98.7 F (37.1 C) (01/14 1504) Temp src: Oral (01/14 1504) BP: 127/79 mmHg (01/14 1504) Pulse Rate: 73  (01/14 1540)  Labs:  Basename 07/24/11 0540 07/23/11 0346 07/23/11 0037 07/22/11 1601 07/22/11 0705  HGB -- 12.0 -- -- --  HCT -- 38.3 -- -- --  PLT -- 161 -- -- --  APTT -- -- -- -- 50*  LABPROT 23.1* 22.8* -- -- 21.7*  INR 2.01* 1.97* -- -- 1.85*  HEPARINUNFRC -- -- -- -- --  CREATININE 1.29* 1.28* -- -- --  CKTOTAL -- 208* 77 323* --  CKMB -- 3.4 9.7* 4.0 --  TROPONINI -- 1.75* 2.24* 2.26* --   Estimated Creatinine Clearance: 39.8 ml/min (by C-G formula based on Cr of 1.29).  Medical History: Past Medical History  Diagnosis Date  . Edema   . Atrial fibrillation   . Neoplasm     unspecifide nature digestive system  . MVP (mitral valve prolapse)   . Hip fracture     left  . Hypertension     Medications:  Prescriptions prior to admission  Medication Sig Dispense Refill  . acetaminophen (TYLENOL) 500 MG tablet Take 500-1,000 mg by mouth every 6 (six) hours as needed. Pain      . alendronate (FOSAMAX) 70 MG tablet Take 70 mg by mouth every 7 (seven) days. Take with a full glass of water on an empty stomach.       Marland Kitchen allopurinol (ZYLOPRIM) 100 MG tablet Take 200 mg by mouth daily.       Marland Kitchen COUMADIN 5 MG tablet TAKE 1 TABLET BY MOUTH EVERY DAY , EXCEPT TAKE  1/2 TABLET DAILY ON MONDAYS AND THURSDAYS  60 tablet  0  . digoxin (LANOXIN) 0.125 MG tablet Take 125 mcg by mouth daily.        . febuxostat (ULORIC) 40 MG tablet Take 40 mg by mouth daily.      . folic acid (FOLVITE) 1 MG tablet Take 1 mg  by mouth daily.      . furosemide (LASIX) 40 MG tablet Take 40 mg by mouth daily.      Marland Kitchen gabapentin (NEURONTIN) 300 MG capsule Take 300 mg by mouth at bedtime.      Marland Kitchen HYDROcodone-acetaminophen (VICODIN) 5-500 MG per tablet Take 1 tablet by mouth every 6 (six) hours as needed.        . hydroxychloroquine (PLAQUENIL) 200 MG tablet Take 200 mg by mouth daily.      Marland Kitchen losartan-hydrochlorothiazide (HYZAAR) 50-12.5 MG per tablet TAKE ONE TABLET BY MOUTH DAILY  30 tablet  6  . metoprolol (TOPROL-XL) 50 MG 24 hr tablet TAKE  1/2 TABLET BY MOUTH TWICE DAILY  30 tablet  9  . predniSONE (DELTASONE) 5 MG tablet Take 5 mg by mouth daily.        Assessment: CHF with EF 35%, AV sclerosis/stenosis, Afib, NSTEMI. Cath: patent arteries with the recommendation for medical management of above conditions. S/p cath to resume home Coumadin for h/o afib. INR currently 2.01.  Goal of Therapy:  INR 2-3   Plan:  Coumadin 5mg  po x  1 tonight. Daily PT/INR  Pasty Spillers 07/24/2011,5:09 PM

## 2011-07-25 ENCOUNTER — Other Ambulatory Visit: Payer: Self-pay

## 2011-07-25 LAB — BASIC METABOLIC PANEL
CO2: 26 mEq/L (ref 19–32)
Glucose, Bld: 90 mg/dL (ref 70–99)
Potassium: 3.8 mEq/L (ref 3.5–5.1)
Sodium: 145 mEq/L (ref 135–145)

## 2011-07-25 LAB — PROTIME-INR: INR: 1.81 — ABNORMAL HIGH (ref 0.00–1.49)

## 2011-07-25 MED ORDER — ASPIRIN 81 MG PO CHEW
81.0000 mg | CHEWABLE_TABLET | Freq: Every day | ORAL | Status: DC
  Filled 2011-07-25: qty 1

## 2011-07-25 MED ORDER — WARFARIN SODIUM 5 MG PO TABS
5.0000 mg | ORAL_TABLET | Freq: Once | ORAL | Status: DC
  Filled 2011-07-25: qty 1

## 2011-07-25 MED ORDER — WARFARIN SODIUM 5 MG PO TABS
5.0000 mg | ORAL_TABLET | Freq: Once | ORAL | Status: AC
  Administered 2011-07-25: 5 mg via ORAL
  Filled 2011-07-25: qty 1

## 2011-07-25 NOTE — Progress Notes (Signed)
ANTICOAGULATION CONSULT NOTE - Initial Consult  Pharmacy Consult for Coumadin Indication: atrial fibrillation  Allergies  Allergen Reactions  . Codeine Other (See Comments)    Makes patient sleepy.  Marland Kitchen Penicillins Rash    Patient Measurements: Height: 5\' 5"  (165.1 cm) Weight: 183 lb 6.8 oz (83.2 kg) IBW/kg (Calculated) : 57   Vital Signs: Temp: 98.8 F (37.1 C) (01/15 0825) Temp src: Oral (01/15 0825) BP: 153/69 mmHg (01/15 0825) Pulse Rate: 72  (01/15 0825)  Labs:  Basename 07/25/11 2956 07/24/11 0540 07/23/11 0346 07/23/11 0037 07/22/11 1601  HGB -- -- 12.0 -- --  HCT -- -- 38.3 -- --  PLT -- -- 161 -- --  APTT -- -- -- -- --  LABPROT 21.3* 23.1* 22.8* -- --  INR 1.81* 2.01* 1.97* -- --  HEPARINUNFRC -- -- -- -- --  CREATININE 0.94 1.29* 1.28* -- --  CKTOTAL -- -- 208* 77 323*  CKMB -- -- 3.4 9.7* 4.0  TROPONINI -- -- 1.75* 2.24* 2.26*   Estimated Creatinine Clearance: 54.3 ml/min (by C-G formula based on Cr of 0.94).  Medical History: Past Medical History  Diagnosis Date  . Edema   . Atrial fibrillation   . Neoplasm     unspecifide nature digestive system  . MVP (mitral valve prolapse)   . Hip fracture     left  . Hypertension     Medications:  Prescriptions prior to admission  Medication Sig Dispense Refill  . acetaminophen (TYLENOL) 500 MG tablet Take 500-1,000 mg by mouth every 6 (six) hours as needed. Pain      . alendronate (FOSAMAX) 70 MG tablet Take 70 mg by mouth every 7 (seven) days. Take with a full glass of water on an empty stomach.       Marland Kitchen allopurinol (ZYLOPRIM) 100 MG tablet Take 200 mg by mouth daily.       Marland Kitchen COUMADIN 5 MG tablet TAKE 1 TABLET BY MOUTH EVERY DAY , EXCEPT TAKE  1/2 TABLET DAILY ON MONDAYS AND THURSDAYS  60 tablet  0  . digoxin (LANOXIN) 0.125 MG tablet Take 125 mcg by mouth daily.        . febuxostat (ULORIC) 40 MG tablet Take 40 mg by mouth daily.      . folic acid (FOLVITE) 1 MG tablet Take 1 mg by mouth daily.      .  furosemide (LASIX) 40 MG tablet Take 40 mg by mouth daily.      Marland Kitchen gabapentin (NEURONTIN) 300 MG capsule Take 300 mg by mouth at bedtime.      Marland Kitchen HYDROcodone-acetaminophen (VICODIN) 5-500 MG per tablet Take 1 tablet by mouth every 6 (six) hours as needed.        . hydroxychloroquine (PLAQUENIL) 200 MG tablet Take 200 mg by mouth daily.      Marland Kitchen losartan-hydrochlorothiazide (HYZAAR) 50-12.5 MG per tablet TAKE ONE TABLET BY MOUTH DAILY  30 tablet  6  . metoprolol (TOPROL-XL) 50 MG 24 hr tablet TAKE  1/2 TABLET BY MOUTH TWICE DAILY  30 tablet  9  . predniSONE (DELTASONE) 5 MG tablet Take 5 mg by mouth daily.        Assessment: CHF with EF 35%, AV sclerosis/stenosis, Afib, NSTEMI. Cath: patent arteries with the recommendation for medical management of above conditions. S/p cath to resume home Coumadin for h/o afib. INR currently 1.81.  No noted bleeding complications.  Goal of Therapy:  INR 2-3   Plan:  Repeat Warfarin 5mg  PO  x 1 tonight. Daily PT/INR  Nadara Mustard, PharmD., MS Clinical Pharmacist Pager:  [phone] 07/25/2011,2:30 PM

## 2011-07-25 NOTE — Progress Notes (Signed)
Left arm bruised and swollen. Held pressure for 10-56minutes several times. Level "2". Kept elevated and instructed pt to not bend and move. On several occasions caught using arm, stating " i forgot" wrist restraint applied for pressure with good results noted. Joseantonio Dittmar, Micki Riley  07/25/2011

## 2011-07-25 NOTE — Progress Notes (Signed)
   CARE MANAGEMENT NOTE 07/25/2011  Patient:  Marissa [name], Marissa [name]   Account Number:  [id number]  Date Initiated:  07/24/2011  Documentation initiated by:  GRAVES-BIGELOW,Danie Diehl  Subjective/Objective Assessment:   Pt is tx from [facility]. In with afib with RVR. Pt plan for cath 07-23-11.     Action/Plan:   Pt was in cath lab during visit. CM will stop back by for visit @ later date. Plan for home with [facility] services.   Anticipated DC Date:  07/28/2011   Anticipated DC Plan:  HOME W HOME HEALTH SERVICES      DC Planning Services  CM consult      PAC Choice  DURABLE MEDICAL EQUIPMENT  HOME HEALTH   Choice offered to / List presented to:  C-1 Patient   DME arranged  3-N-1      DME agency  [facility]     HH arranged  HH-2 PT      [facility] agency  [facility]   Status of service:  Completed, signed off Medicare Important Message given?   (If response is "NO", the following Medicare IM given date fields will be blank) Date Medicare IM given:   Date Additional Medicare IM given:    Discharge Disposition:  HOME W HOME HEALTH SERVICES  Per UR Regulation:    Comments:  07-25-11 0956 Tomi Bamberger, RN,BSN [phone] CM did see PT notes and plan for home with [facility] services. CM will speak to pt and offer choice for services. Pt is from home alone with family support.  Daughter lives  near pt and will be able to provide care if needed. Pt declines RN to visit at this time. Pt was pretty active prior to admission. Pt will need HH PT orders and face to face signed. List was given to pt for choice and she chose [facility] for services. CM will make referral for services. SOC to begin within 24-48 hours post d/c.   07-24-11 1532 Tomi Bamberger, RN, BSN [phone] CM will continue to monitor for d/c disposition.

## 2011-07-25 NOTE — Progress Notes (Signed)
Patient ID: Marissa [name], female   DOB: 10/06/1932, 78 y.o.   MRN: [id number] SUBJECTIVE:Doing well this am. No SOB or CP. Left wrist sore and ecchymoticnbut no hand complaints.  Filed Vitals:   07/24/11 2000 07/24/11 2339 07/25/11 0433 07/25/11 0500  BP: 131/51 146/74 145/71   Pulse: 59 68 64   Temp: 98.7 F (37.1 C) 99.8 F (37.7 C) 98.9 F (37.2 C)   TempSrc: Oral Oral Oral   Resp: 16 18 17    Height:      Weight:    83.2 kg (183 lb 6.8 oz)  SpO2: 95% 94% 96%     Intake/Output Summary (Last 24 hours) at 07/25/11 0807 Last data filed at 07/25/11 0000  Gross per 24 hour  Intake 793.33 ml  Output   1325 ml  Net -531.67 ml    LABS: Basic Metabolic Panel:  Basename 07/25/11 0635 07/24/11 0540  NA 145 145  K 3.8 3.9  CL 109 107  CO2 26 30  GLUCOSE 90 86  BUN 27* 30*  CREATININE 0.94 1.29*  CALCIUM 9.5 9.3  MG -- --  PHOS -- --   Liver Function Tests:  [facility] 07/23/11 0346  AST 50*  ALT 27  ALKPHOS 35*  BILITOT 0.4  PROT 5.9*  ALBUMIN 2.7*   No results found for this basename: LIPASE:2,AMYLASE:2 in the last 72 hours CBC:  Basename 07/23/11 0346  WBC 4.2  NEUTROABS --  HGB 12.0  HCT 38.3  MCV 90.8  PLT 161   Cardiac Enzymes:  Basename 07/23/11 0346 07/23/11 0037 07/22/11 1601  CKTOTAL 208* 77 323*  CKMB 3.4 9.7* 4.0  CKMBINDEX -- -- --  TROPONINI 1.75* 2.24* 2.26*   BNP: No components found with this basename: POCBNP:3 D-Dimer: No results found for this basename: DDIMER:2 in the last 72 hours Hemoglobin A1C: No results found for this basename: HGBA1C in the last 72 hours Fasting Lipid Panel:  Basename 07/23/11 0346  CHOL 132  HDL 49  LDLCALC 66  TRIG 83  CHOLHDL 2.7  LDLDIRECT --   Thyroid Function Tests: No results found for this basename: TSH,T4TOTAL,FREET3,T3FREE,THYROIDAB in the last 72 hours Anemia Panel: No results found for this basename: VITAMINB12,FOLATE,FERRITIN,TIBC,IRON,RETICCTPCT in the last 72 hours  RADIOLOGY: Dg  Chest 2 View  07/23/2011  *RADIOLOGY REPORT*  Clinical Data: Difficulty breathing.  CHEST - 2 VIEW  Comparison: Plain film chest 07/21/2011.  CT chest 12/21/2010.  Findings: There is cardiomegaly.  Mild linear atelectasis is seen in the right mid lung.  Very small bilateral pleural effusions are noted.  IMPRESSION:  1.  Cardiomegaly without edema. 2.  Mild subsegmental atelectasis right midlung. 3.  Small bilateral pleural effusions.  Original Report Authenticated By: Bernadene Bell. D'ALESSIO, M.D.   Dg Lumbar Spine Complete  07/21/2011  *RADIOLOGY REPORT*  Clinical Data: Status post fall.  Low back pain radiating into the left hip.  LUMBAR SPINE - COMPLETE 4+ VIEW  Comparison: Two views lumbar spine 02/14/2011.  Findings: Convex left scoliosis and multilevel degenerative disease are again seen.  The patient has a compression fracture deformity of L4, unchanged.  No new fracture is identified.  IMPRESSION: No acute finding.  Degenerative disease and old L4 compression fracture.  Original Report Authenticated By: Bernadene Bell. Maricela Curet, M.D.   Ct Head Wo Contrast  07/21/2011  *RADIOLOGY REPORT*  Clinical Data: History of fall.  Altered mental status.  CT HEAD WITHOUT CONTRAST  Technique:  Contiguous axial images were obtained from the base of  the skull through the vertex without contrast.  Comparison: None.  Findings: No acute displaced skull fracture identified.  Mastoids are well aerated.  No acute intracranial abnormality. Specifically, no definite evidence of ischemia, mass, mass effect, hydrocephalus or abnormal intra or extra-axial fluid collections.  Mild cerebral atrophy is evident.  Physiologic calcifications are noted in the basal ganglia bilaterally.  Prominent calcifications are also noted along the falx.  Mild thickening of the ethmoid sinuses is noted. No air fluid levels are seen within the sinuses.  IMPRESSION:  1.  No displaced skull fracture or acute intracranial abnormality identified. 2.  Mild  cerebral atrophy.  Original Report Authenticated By: Florencia Reasons, M.D.   Dg Chest Portable 1 View  07/21/2011  *RADIOLOGY REPORT*  Clinical Data: Cough, shortness of breath, fever.  PORTABLE CHEST - 1 VIEW  Comparison: 07/21/2011  Findings: There is cardiomegaly.  Bibasilar atelectasis.  No overt edema.  No effusions or acute bony abnormality.  IMPRESSION: Cardiomegaly.  Bibasilar atelectasis.  Original Report Authenticated By: Cyndie Chime, M.D.   Dg Chest Portable 1 View  07/21/2011  *RADIOLOGY REPORT*  Clinical Data: Shortness of breath.  PORTABLE CHEST - 1 VIEW  Comparison: Chest x-ray 12/17/2007, chest CT 12/21/2010  Findings: Heart is enlarged.  There is interstitial pulmonary edema.  No focal consolidations or definite pleural effusions are present.  IMPRESSION: Cardiomegaly.  Mild interstitial edema.  Original Report Authenticated By: Patterson Hammersmith, M.D.    PHYSICAL EXAM General: Well developed, well nourished, in no acute distress Head: Eyes PERRLA, Normal cephalic and atramatic  Lungs: Clear bilaterally to auscultation and percussion. Heart: IRRR S1 S2,  Pulses are 2+ & equal.            No carotid bruit. No JVD.  No abdominal bruits.  Abdomen: Bowel sounds are positive, abdomen soft and non-tender Msk:  Back normal, normal gait. Normal strength and tone for age. Extremities: No clubbing, cyanosis or edema.  DP +1, left radial site swollen and tender, ecchymotic, Neuro: Alert and oriented X 3. Psych:  Good affect, responds appropriately  TELEMETRY: Reviewed telemetry pt in CAF with well controlled ventricular rate ASSESSMENT AND PLAN:  Marissa [name] has chronic systolic CHF and is now euvolemic. BUN/Cr stable. Her elevated cardiac enzymes most likely from laying on floor for 9 hours. Will continue with medical therapy. Cardiac rehab and most likely discharge tomorrow. Will d/c IVF. She is independent and can be discharged to home.   Valera Castle, MD 07/25/2011 8:07 AM

## 2011-07-25 NOTE — Progress Notes (Signed)
Physical Therapy Treatment Patient Details Name: Marissa [name] MRN: [id number] DOB: 07-22-1932 Today's Date: 07/25/2011  PT Assessment/Plan  PT - Assessment/Plan Comments on Treatment Session: Pt continues to progress with mobility.  Recommended to pt to sit on firm, high surfaces at home. PT Plan: Discharge plan remains appropriate Follow Up Recommendations: Home health PT Equipment Recommended: None recommended by PT PT Goals  Acute Rehab PT Goals PT Goal: Sit to Stand - Progress: Progressing toward goal PT Goal: Stand to Sit - Progress: Met PT Goal: Ambulate - Progress: Progressing toward goal  PT Treatment Precautions/Restrictions  Precautions Precautions: Fall Restrictions Weight Bearing Restrictions: No Other Position/Activity Restrictions: Pt with chronic a fib. Mobility (including Balance) Transfers Sit to Stand: 4: Min assist (due to decr use of Lt. wrist) Stand to Sit: 6: Modified independent (Device/Increase time) Ambulation/Gait Ambulation/Gait Assistance: 6: Modified independent (Device/Increase time) Ambulation Distance (Feet): 140 Feet Assistive device: Rolling walker Gait Pattern: Decreased stance time - left;Step-to pattern;Trunk flexed  Static Standing Balance Static Standing - Balance Support: Bilateral upper extremity supported Static Standing - Level of Assistance: 6: Modified independent (Device/Increase time) (with rolling walker) Exercise    End of Session PT - End of Session Activity Tolerance: Patient tolerated treatment well Patient left: in bed;with call bell in reach General Behavior During Session: [facility] for tasks performed Cognition: [facility] for tasks performed  [facility] 07/25/2011, 4:27 PM  [employer] PT [phone]

## 2011-07-25 NOTE — Progress Notes (Signed)
(  late entry)  Pt. C/o throbbing pain today at right TR site; relieved with vicodin; arm maintained elevated and with ice. Marijo Conception 07/25/2011

## 2011-07-26 ENCOUNTER — Encounter (HOSPITAL_COMMUNITY): Payer: Self-pay | Admitting: Nurse Practitioner

## 2011-07-26 DIAGNOSIS — R296 Repeated falls: Secondary | ICD-10-CM

## 2011-07-26 DIAGNOSIS — I5021 Acute systolic (congestive) heart failure: Secondary | ICD-10-CM

## 2011-07-26 LAB — PROTIME-INR: Prothrombin Time: 22.8 seconds — ABNORMAL HIGH (ref 11.6–15.2)

## 2011-07-26 MED ORDER — ASPIRIN 81 MG PO CHEW: 81.0000 mg | CHEWABLE_TABLET | Freq: Every day | ORAL | Status: DC

## 2011-07-26 MED ORDER — POTASSIUM CHLORIDE CRYS ER 20 MEQ PO TBCR: 20.0000 meq | EXTENDED_RELEASE_TABLET | Freq: Every day | ORAL | Status: DC

## 2011-07-26 MED ORDER — ATORVASTATIN CALCIUM 10 MG PO TABS: 10.0000 mg | ORAL_TABLET | Freq: Every day | ORAL | Status: DC

## 2011-07-26 MED ORDER — FUROSEMIDE 40 MG PO TABS: 20.0000 mg | ORAL_TABLET | Freq: Every day | ORAL | Status: DC

## 2011-07-26 MED ORDER — WARFARIN SODIUM 5 MG PO TABS
5.0000 mg | ORAL_TABLET | Freq: Once | ORAL | Status: DC
  Filled 2011-07-26: qty 1

## 2011-07-26 MED ORDER — LOSARTAN POTASSIUM 50 MG PO TABS: 50.0000 mg | ORAL_TABLET | Freq: Every day | ORAL | Status: DC

## 2011-07-26 NOTE — Progress Notes (Signed)
   CARE MANAGEMENT NOTE 07/26/2011  Patient:  ERYCA, BOLTE   Account Number:  [id number]  Date Initiated:  07/24/2011  Documentation initiated by:  GRAVES-BIGELOW,Henrik Orihuela  Subjective/Objective Assessment:   Pt is tx from [facility]. In with afib with RVR. Pt plan for cath 07-23-11.     Action/Plan:   Pt was in cath lab during visit. CM will stop back by for visit @ later date. Plan for home with [facility] services.   Anticipated DC Date:  07/28/2011   Anticipated DC Plan:  HOME W HOME HEALTH SERVICES      DC Planning Services  CM consult      PAC Choice  DURABLE MEDICAL EQUIPMENT  HOME HEALTH   Choice offered to / List presented to:  C-1 Patient   DME arranged  3-N-1      DME agency  [facility]     HH arranged  HH-2 PT      [facility] agency  [facility]   Status of service:  Completed, signed off Medicare Important Message given?   (If response is "NO", the following Medicare IM given date fields will be blank) Date Medicare IM given:   Date Additional Medicare IM given:    Discharge Disposition:  HOME W HOME HEALTH SERVICES  Per UR Regulation:  Reviewed for med. necessity/level of care/duration of stay  Comments:  07-26-11 [address] Tomi Bamberger, [place] [phone] Pt plan for d/c today. No other needs assessed by CM at this time.   07/26/11 1050  UR completed. Tera Mater, RN, BSN   07-25-11 [phone] Tomi Bamberger, RN,BSN [phone] CM did see PT notes and plan for home with [facility] services. CM will speak to pt and offer choice for services. Pt is from home alone with family support.  Daughter lives  near pt and will be able to provide care if needed. Pt declines RN to visit at this time. Pt was pretty active prior to admission. Pt will need HH PT orders and face to face signed. List was given to pt for choice and she chose [facility] for services. CM will make referral for services. SOC to begin within 24-48 hours post d/c.   07-24-11  1532 Tomi Bamberger, RN, BSN [phone] CM will continue to monitor for d/c disposition.

## 2011-07-26 NOTE — Progress Notes (Signed)
Iv d/c'ed. Tele d/c'ed. D/c instructions and medications discussed with pt and pt's daughter. Pt instructed on radial site care. All questions answered. Pt and pt's daughter state understanding. Pt d/c'ed home with HHPT and a 3 in 1 BSC. Pt escorted out via wheelchair with staff. Pt does not appear in any immediate distress. Pt d/c'ed to home.

## 2011-07-26 NOTE — Progress Notes (Signed)
Physical Therapy Treatment Patient Details Name: Marissa [name] MRN: [id number] DOB: 26-Apr-1933 Today's Date: 07/26/2011  PT Assessment/Plan  PT - Assessment/Plan Comments on Treatment Session: Safe to return home with daughter's assist/S and home health PT f/u. PT Plan: Discharge plan remains appropriate Follow Up Recommendations: Home health PT Equipment Recommended: None recommended by PT (received her 3 in 1) PT Goals  Acute Rehab PT Goals PT Goal: Supine/Side to Sit - Progress: Met PT Goal: Sit to Supine/Side - Progress: Met PT Goal: Sit to Stand - Progress: Met PT Goal: Stand to Sit - Progress: Met PT Goal: Ambulate - Progress: Progressing toward goal PT Goal: Up/Down Stairs - Progress: Met  PT Treatment Precautions/Restrictions  Precautions Precautions: Fall Restrictions Weight Bearing Restrictions: No Other Position/Activity Restrictions: Pt with chronic a fib. Mobility (including Balance) Bed Mobility Bed Mobility: Yes Supine to Sit: 7: Independent Sitting - Scoot to Edge of Bed: 7: Independent Sit to Supine: 7: Independent Transfers Transfers: Yes Sit to Stand: 6: Modified independent (Device/Increase time) Stand to Sit: 6: Modified independent (Device/Increase time) Ambulation/Gait Ambulation/Gait: Yes Ambulation/Gait Assistance: 5: Supervision Ambulation/Gait Assistance Details (indicate cue type and reason): vc for postural checks; Ambulation Distance (Feet): 240 Feet Assistive device: Rolling walker Gait Pattern: Step-through pattern;Decreased stride length;Trunk flexed Stairs: Yes Stairs Assistance: 5: Supervision Stairs Assistance Details (indicate cue type and reason): step to ; forward ; onrail used Stair Management Technique: One rail Right;One rail Left;Alternating pattern;Forwards Number of Stairs: 3   Posture/Postural Control Posture/Postural Control: No significant limitations Balance Balance Assessed:  (generally steady through out and  should be safe with RW) Exercise    End of Session PT - End of Session Activity Tolerance: Patient tolerated treatment well Patient left: in bed;with call bell in reach Nurse Communication: Mobility status for ambulation General Behavior During Session: [facility] for tasks performed Cognition: [facility] for tasks performed  Ercel Pepitone, Eliseo Gum 07/26/2011, 11:40 AM  07/26/2011  Salado Bing, PT [phone] (pager)

## 2011-07-26 NOTE — Progress Notes (Signed)
Subjective:   [name] is a 78 yo with CHF ( 30-35%) with nonobstructive CAD.  She has chronic A-fib.  She is on BB, ARB, Lasix, coumadin.  Wants to go home.       Marland Kitchen allopurinol  200 mg Oral Daily  . aspirin  81 mg Oral Daily  . digoxin  125 mcg Oral Daily  . enoxaparin (LOVENOX) injection  1 mg/kg Subcutaneous Q12H  . febuxostat  40 mg Oral Daily  . folic acid  1 mg Oral Daily  . furosemide  20 mg Oral Daily  . hydroxychloroquine  200 mg Oral Daily  . losartan  50 mg Oral Daily  . metoprolol succinate  50 mg Oral Daily  . potassium chloride  20 mEq Oral TID  . predniSONE  5 mg Oral Daily  . rosuvastatin  10 mg Oral q1800  . warfarin  5 mg Oral Once  . DISCONTD: aspirin  325 mg Oral Daily  . DISCONTD: warfarin  5 mg Oral Once      Objective:  Vital Signs in the last 24 hours: Blood pressure 160/89, pulse 73, temperature 97.8 F (36.6 C), temperature source Oral, resp. rate 20, height 5\' 5"  (1.651 m), weight 184 lb 1.4 oz (83.5 kg), SpO2 95.00%. Temp:  [97.8 F (36.6 C)-98.2 F (36.8 C)] 97.8 F (36.6 C) (01/16 0408) Pulse Rate:  [55-80] 73  (01/16 0408) Resp:  [17-20] 20  (01/16 0408) BP: (144-160)/(76-89) 160/89 mmHg (01/16 0408) SpO2:  [95 %-97 %] 95 % (01/16 0408) Weight:  [184 lb 1.4 oz (83.5 kg)-185 lb 3.2 oz (84.006 kg)] 184 lb 1.4 oz (83.5 kg) (01/16 0408)  Intake/Output from previous day: 01/15 0701 - 01/16 0700 In: 1260 [P.O.:1260] Out: 1000 [Urine:1000] Intake/Output from this shift: Total I/O In: 240 [P.O.:240] Out: -   Physical Exam: The patient is alert and oriented x 3.  The mood and affect are normal.   Skin: warm and dry.  Color is normal.    HEENT:   the sclera are nonicteric.  The mucous membranes are moist.  The carotids are 2+ without bruits.  There is no thyromegaly.  There is no JVD.    Lungs: clear.  The chest wall is non tender.    Heart: regular rate with a normal S1 and S2.  There are no murmurs, gallops, or rubs. The PMI is  not displaced.     Abdomen: good bowel sounds.  There is no guarding or rebound.  There is no hepatosplenomegaly or tenderness.  There are no masses.   Extremities:  no clubbing, cyanosis, or edema.  The legs are without rashes.  The distal pulses are intact.   Neuro:  Cranial nerves II - XII are intact.  Motor and sensory functions are intact.     Lab Results: No results found for this basename: WBC:2,HGB:2,PLT:2 in the last 72 hours  Basename 07/25/11 0635 07/24/11 0540  NA 145 145  K 3.8 3.9  CL 109 107  CO2 26 30  GLUCOSE 90 86  BUN 27* 30*  CREATININE 0.94 1.29*   No results found for this basename: TROPONINI:2,CK,MB:2 in the last 72 hours No results found for this basename: BNP in the last 72 hours Hepatic Function Panel No results found for this basename: PROT,ALBUMIN,AST,ALT,ALKPHOS,BILITOT,BILIDIR,IBILI in the last 72 hours Lab Results  Component Value Date   CHOL 132 07/23/2011   HDL 49 07/23/2011   LDLCALC 66 07/23/2011   TRIG 83 07/23/2011  CHOLHDL 2.7 07/23/2011    Basename 07/26/11 0555  INR 1.97*   Tele:  A-Fib   Assessment/Plan:   Congestive heart failure (CHF) (07/22/2011)  Continue current meds.  Further adjustment as OP Atrial fibrillation with RVR (07/22/2011) Rate is well controled.   Disposition: home today after ambulation.  Marissa [name], Marissa [name]., MD, [facility] 07/26/2011, 8:56 AM LOS: Day 5

## 2011-07-26 NOTE — Progress Notes (Signed)
07/26/11 1050  UR completed. Tera Mater, RN, BSN

## 2011-07-26 NOTE — Discharge Summary (Signed)
Patient ID: Marissa [name],  MRN: [id number], DOB/AGE: Jan 01, 1933 78 y.o.  Admit date: 07/21/2011 Discharge date: 07/26/2011  Primary Care Provider: R. Fagan Primary Cardiologist: T. Wall  Discharge Diagnoses Principal Problem:  *Acute systolic congestive heart failure Active Problems:  Atrial fibrillation with RVR  NSTEMI (non-ST elevated myocardial infarction)  Frequent falls   Allergies Allergies  Allergen Reactions  . Codeine Other (See Comments)    Makes patient sleepy.  Marland Kitchen Penicillins Rash    Procedures 07/23/2011 2D Echocardiogram Study Conclusions  - Left ventricle: The cavity size was normal. Wall thickness   was normal. The estimated ejection fraction was 35%.   Diffuse hypokinesis. Doppler parameters are consistent   with high ventricular filling pressure. - Right atrium: The atrium was mildly dilated. ____________________________________________________  07/24/2011 Cardiac Catheterization Coronary angiography: Coronary dominance: right  Left mainstem: Widely patent common divides into the LAD, intermediate branch, and left circumflex.  Left anterior descending (LAD): Tortuous vessel, widely patent to the left ventricular apex. No significant obstructive disease.  Left circumflex (LCx): Widely patent throughout no significant obstructive disease.  Right coronary artery (RCA): There is a mild narrowing in the proximal vessel at the area of tortuosity. This is clearly nonobstructive no more than 20% stenosis is present. The vessel is dominant and gives off a PDA and a posterolateral branch without significant obstruction.  Left ventriculography: There is severe LV dysfunction with LVEF estimated at 35%. There appears to be at least 2+ mitral regurgitation. ________________________________________________________   History of Present Illness  78 year old female with the above problem list. Patient was in her usual state of health until approximately 3-4 days  prior to admission when she began to experience progressive worsening of baseline level of dyspnea. She did not note any chest pain palpitations edema PND or orthopnea. She did however have 2 ground-level mechanical falls which she attributes to her arthritis. Because of progression of dyspnea, patient presented to the [facility] ED on January 11 and was found to be nature fibrillation with rapid ventricular response. She was also felt to have volume overload and her chest x-ray did infection mild interstitial edema. In the setting of recent mechanical falls, CT of the head was performed and showed no acute intracranial abnormality while imaging of her lumbar spine showed no evidence of acute fracture. Patient was also found to have elevated cardiac enzymes with an initial troponin of 5.12 and a CK of 479. In the absence of chest pain is not immediately clear it enzymatic elevation was secondary to acute brain syndrome or prolonged stasis as patient was on the floor for approximately 9 hours following a fall. Patient was treated with IV Lasix as well as diltiazem infusion and was transferred to [facility] for further evaluation.  Hospital Course   Following admission to [facility], patient's heart rate was stabilized on IV diltiazem. She was continued on IV diuresis with clinical improvement. Initially, her Coumadin was continued. A 2-D echocardiogram was undertaken on January 13 and this showed an EF of 35% with diffuse hypokinesis. This was a new finding. In light of this finding, in combination with her elevated cardiac enzymes, decision was made to pursue diagnostic catheterization. Her Coumadin was placed on hold and she underwent diagnostic catheterization via the left radial artery. Diagnostic catheterization revealed nonobstructive CAD and an EF of 35%. In the setting of nonischemic cardiopathy, continue medical therapy was recommended. Because of reduced LV function, diltiazem was discontinued. Patient  was resumed on prior home dose of  beta blocker therapy along with digoxin, and angiotensin receptor blocker therapy was also continued. Her Lasix was switched from IV to by mouth and she has been clinically stable. Her weight, which was 195 pounds on admission, is down to 184 pounds today. Patient has been seen by physical and occupational therapy and we have arranged for home health physical therapy. Patient discharged home today in good condition.  Discharge Vitals:  Blood pressure 160/89, pulse 73, temperature 97.8 F (36.6 C), temperature source Oral, resp. rate 20, height 5\' 5"  (1.651 m), weight 184 lb 1.4 oz (83.5 kg), SpO2 95.00%. Labs: Basic Metabolic Panel:  Basename 07/25/11 0635 07/24/11 0540  NA 145 145  K 3.8 3.9  CL 109 107  CO2 26 30  GLUCOSE 90 86  BUN 27* 30*  CREATININE 0.94 1.29*  CALCIUM 9.5 9.3  MG -- --  PHOS -- --   Lab Results  Component Value Date   CKTOTAL 208* 07/23/2011   CKMB 3.4 07/23/2011   TROPONINI 1.75* 07/23/2011   Lab Results  Component Value Date   ALT 27 07/23/2011   AST 50* 07/23/2011   ALKPHOS 35* 07/23/2011   BILITOT 0.4 07/23/2011   Lab Results  Component Value Date   INR 1.97* 07/26/2011   INR 1.81* 07/25/2011   INR 2.01* 07/24/2011   Lab Results  Component Value Date   WBC 4.2 07/23/2011   HGB 12.0 07/23/2011   HCT 38.3 07/23/2011   MCV 90.8 07/23/2011   PLT 161 07/23/2011     Disposition:  Follow-up Information    Follow up with Carylon Perches, MD. (as scheduled)       Follow up with Joni Reining, NP on 08/03/2011. (11:40am - Coumadin Clinic and visit with Dr. Vern Claude Nurse Practitioner)    Contact information:   [address] [place] [address] [phone]          Discharge Medications: Current Discharge Medication List    START taking these medications   Details  aspirin 81 MG chewable tablet Chew 1 tablet (81 mg total) by mouth daily.    atorvastatin (LIPITOR) 10  MG tablet Take 1 tablet (10 mg total) by mouth daily. Qty: 30 tablet, Refills: 6    losartan (COZAAR) 50 MG tablet Take 1 tablet (50 mg total) by mouth daily. Qty: 30 tablet, Refills: 6    potassium chloride SA (K-DUR,KLOR-CON) 20 MEQ tablet Take 1 tablet (20 mEq total) by mouth daily. Qty: 30 tablet, Refills: 6      CONTINUE these medications which have CHANGED   Details  furosemide (LASIX) 40 MG tablet Take 0.5 tablets (20 mg total) by mouth daily. Qty: 30 tablet, Refills: 3      CONTINUE these medications which have NOT CHANGED   Details  acetaminophen (TYLENOL) 500 MG tablet Take 500-1,000 mg by mouth every 6 (six) hours as needed. Pain    alendronate (FOSAMAX) 70 MG tablet Take 70 mg by mouth every 7 (seven) days. Take with a full glass of water on an empty stomach.     allopurinol (ZYLOPRIM) 100 MG tablet Take 200 mg by mouth daily.     COUMADIN 5 MG tablet TAKE 1 TABLET BY MOUTH EVERY DAY , EXCEPT TAKE  1/2 TABLET DAILY ON MONDAYS AND THURSDAYS Qty: 60 tablet, Refills: 0    digoxin (LANOXIN) 0.125 MG tablet Take 125 mcg by mouth daily.      febuxostat (ULORIC) 40 MG tablet Take 40  mg by mouth daily.    folic acid (FOLVITE) 1 MG tablet Take 1 mg by mouth daily.    gabapentin (NEURONTIN) 300 MG capsule Take 300 mg by mouth at bedtime.    HYDROcodone-acetaminophen (VICODIN) 5-500 MG per tablet Take 1 tablet by mouth every 6 (six) hours as needed.      hydroxychloroquine (PLAQUENIL) 200 MG tablet Take 200 mg by mouth daily.    metoprolol (TOPROL-XL) 50 MG 24 hr tablet TAKE  1/2 TABLET BY MOUTH TWICE DAILY Qty: 30 tablet, Refills: 9    predniSONE (DELTASONE) 5 MG tablet Take 5 mg by mouth daily.      STOP taking these medications     losartan-hydrochlorothiazide (HYZAAR) 50-12.5 MG per tablet      Calcium-Vitamin D ([employer] CALCIUM-600/VIT D PO)         Outstanding Labs/Studies  F/u INR 08/03/2011  Duration of Discharge Encounter: Greater than 30 minutes  including physician time.  Signed, Nicolasa Ducking NP 07/26/2011, 11:59 AM    Attending Note:   The patient was seen and examined.  Agree with assessment and plan as noted above.  See my note from earlier today  Alvia Grove., MD, [facility] 07/26/2011, 12:57 PM

## 2011-07-26 NOTE — Progress Notes (Signed)
ANTICOAGULATION CONSULT NOTE - Initial Consult  Pharmacy Consult for Coumadin Indication: atrial fibrillation  Allergies  Allergen Reactions  . Codeine Other (See Comments)    Makes patient sleepy.  Marland Kitchen Penicillins Rash    Patient Measurements: Height: 5\' 5"  (165.1 cm) Weight: 184 lb 1.4 oz (83.5 kg) (a scale) IBW/kg (Calculated) : 57   Vital Signs: Temp: 97.8 F (36.6 C) (01/16 0408) BP: 160/89 mmHg (01/16 0408) Pulse Rate: 73  (01/16 0408)  Labs:  Basename 07/26/11 0555 07/25/11 0635 07/24/11 0540  HGB -- -- --  HCT -- -- --  PLT -- -- --  APTT -- -- --  LABPROT 22.8* 21.3* 23.1*  INR 1.97* 1.81* 2.01*  HEPARINUNFRC -- -- --  CREATININE -- 0.94 1.29*  CKTOTAL -- -- --  CKMB -- -- --  TROPONINI -- -- --   Estimated Creatinine Clearance: 54.3 ml/min (by C-G formula based on Cr of 0.94).  Medical History: Past Medical History  Diagnosis Date  . Edema   . Atrial fibrillation   . Neoplasm     unspecifide nature digestive system  . MVP (mitral valve prolapse)   . Hip fracture     left  . Hypertension     Medications:  Prescriptions prior to admission  Medication Sig Dispense Refill  . acetaminophen (TYLENOL) 500 MG tablet Take 500-1,000 mg by mouth every 6 (six) hours as needed. Pain      . alendronate (FOSAMAX) 70 MG tablet Take 70 mg by mouth every 7 (seven) days. Take with a full glass of water on an empty stomach.       Marland Kitchen allopurinol (ZYLOPRIM) 100 MG tablet Take 200 mg by mouth daily.       Marland Kitchen COUMADIN 5 MG tablet TAKE 1 TABLET BY MOUTH EVERY DAY , EXCEPT TAKE  1/2 TABLET DAILY ON MONDAYS AND THURSDAYS  60 tablet  0  . digoxin (LANOXIN) 0.125 MG tablet Take 125 mcg by mouth daily.        . febuxostat (ULORIC) 40 MG tablet Take 40 mg by mouth daily.      . folic acid (FOLVITE) 1 MG tablet Take 1 mg by mouth daily.      . furosemide (LASIX) 40 MG tablet Take 40 mg by mouth daily.      Marland Kitchen gabapentin (NEURONTIN) 300 MG capsule Take 300 mg by mouth at bedtime.       Marland Kitchen HYDROcodone-acetaminophen (VICODIN) 5-500 MG per tablet Take 1 tablet by mouth every 6 (six) hours as needed.        . hydroxychloroquine (PLAQUENIL) 200 MG tablet Take 200 mg by mouth daily.      Marland Kitchen losartan-hydrochlorothiazide (HYZAAR) 50-12.5 MG per tablet TAKE ONE TABLET BY MOUTH DAILY  30 tablet  6  . metoprolol (TOPROL-XL) 50 MG 24 hr tablet TAKE  1/2 TABLET BY MOUTH TWICE DAILY  30 tablet  9  . predniSONE (DELTASONE) 5 MG tablet Take 5 mg by mouth daily.        Assessment: CHF with EF 35%, AV sclerosis/stenosis, Afib, NSTEMI. Cath: patent arteries with the recommendation for medical management of above conditions. S/p cath to resume home Coumadin for h/o afib. INR currently 1.81.  No noted bleeding complications.  Goal of Therapy:  INR 2-3   Plan:  Repeat Warfarin 5mg  PO x 1 tonight. Daily PT/INR   Dareen Piano D 07/26/2011 9:48 AM

## 2011-08-03 ENCOUNTER — Encounter: Payer: Self-pay | Admitting: Thoracic Diseases

## 2011-08-03 ENCOUNTER — Telehealth: Payer: Self-pay

## 2011-08-03 ENCOUNTER — Ambulatory Visit (INDEPENDENT_AMBULATORY_CARE_PROVIDER_SITE_OTHER): Payer: Medicare Other | Admitting: Adult Health

## 2011-08-03 ENCOUNTER — Other Ambulatory Visit: Payer: Self-pay | Admitting: Adult Health

## 2011-08-03 ENCOUNTER — Ambulatory Visit (INDEPENDENT_AMBULATORY_CARE_PROVIDER_SITE_OTHER): Payer: Medicare Other | Admitting: *Deleted

## 2011-08-03 ENCOUNTER — Encounter: Payer: Self-pay | Admitting: Adult Health

## 2011-08-03 ENCOUNTER — Ambulatory Visit (HOSPITAL_COMMUNITY)
Admission: RE | Admit: 2011-08-03 | Discharge: 2011-08-03 | Disposition: A | Discharge status: Home / Self Care | Payer: Medicare Other | Source: Ambulatory Visit | Attending: Adult Health | Admitting: Adult Health

## 2011-08-03 DIAGNOSIS — Z7901 Long term (current) use of anticoagulants: Secondary | ICD-10-CM

## 2011-08-03 DIAGNOSIS — I4891 Unspecified atrial fibrillation: Secondary | ICD-10-CM

## 2011-08-03 DIAGNOSIS — I808 Phlebitis and thrombophlebitis of other sites: Secondary | ICD-10-CM

## 2011-08-03 DIAGNOSIS — M7981 Nontraumatic hematoma of soft tissue: Secondary | ICD-10-CM | POA: Insufficient documentation

## 2011-08-03 DIAGNOSIS — M25439 Effusion, unspecified wrist: Secondary | ICD-10-CM | POA: Insufficient documentation

## 2011-08-03 DIAGNOSIS — I5022 Chronic systolic (congestive) heart failure: Secondary | ICD-10-CM

## 2011-08-03 DIAGNOSIS — R937 Abnormal findings on diagnostic imaging of other parts of musculoskeletal system: Secondary | ICD-10-CM | POA: Insufficient documentation

## 2011-08-03 DIAGNOSIS — M25539 Pain in unspecified wrist: Secondary | ICD-10-CM | POA: Insufficient documentation

## 2011-08-03 LAB — CBC WITH DIFFERENTIAL/PLATELET
Basophils Relative: 1 % (ref 0–1)
Eosinophils Absolute: 0.1 10*3/uL (ref 0.0–0.7)
Eosinophils Relative: 0 % (ref 0–5)
HCT: 41.8 % (ref 36.0–46.0)
Hemoglobin: 13 g/dL (ref 12.0–15.0)
Lymphs Abs: 1.3 10*3/uL (ref 0.7–4.0)
MCH: 28.8 pg (ref 26.0–34.0)
MCHC: 31.1 g/dL (ref 30.0–36.0)
MCV: 92.5 fL (ref 78.0–100.0)
Monocytes Absolute: 0.6 10*3/uL (ref 0.1–1.0)
Monocytes Relative: 6 % (ref 3–12)
Neutrophils Relative %: 82 % — ABNORMAL HIGH (ref 43–77)
WBC: 11.4 10*3/uL — ABNORMAL HIGH (ref 4.0–10.5)

## 2011-08-03 LAB — POCT INR: INR: 3.2

## 2011-08-03 MED ORDER — CEPHALEXIN 500 MG PO CAPS: 500.0000 mg | ORAL_CAPSULE | Freq: Two times a day (BID) | ORAL | Status: AC

## 2011-08-03 NOTE — Progress Notes (Signed)
**Note De-Identified Barnett Elzey Obfuscation** Appt. scheduled with Dr. Hart Rochester (VS) on 08-04-11 at 2:00 for pain and swelling in pt's left hand post cath. Pt. Is aware./LV

## 2011-08-03 NOTE — Assessment & Plan Note (Addendum)
There is significant ecchymosis in the left forearm which she says is getting better. However, the area proximal to the catheter insertion site remains swollen and red, with warmth noted. She said that it was that way on discharge. Unable to palpate pulse. I have asked Dr. [name] to look at this as well. I planned on an ultrasound to evaluate for fistula or bleeding but am concerned about need to treat for infection as well. Dr. [name] suggested that I treat for infection. I will start her on Keflex 500 mg BID for one week. She is sent over to [facility] ultrasound for determination of fistula. Dr. [name] did not believe compartment syndrome was evident. She will follow-up with Dr. Daleen Squibb in one week. In the interim, I will have CBC completed. Most recent INR was 2.01.  ADDENDUM: Ultrasound of left wrist: IMPRESSION: 3 cm diameter complex hypoechoic collection identified anterior at left wrist. This may either represent a large hematoma or a pseudoaneurysm without detectable flow by color Doppler imaging.  Patient was notified of this and scheduled to see [facility] on Friday, 08/03/2010 for evaluation. She verbalizes understanding. She will keep follow-up appointment with Dr. Daleen Squibb next week.

## 2011-08-03 NOTE — Assessment & Plan Note (Signed)
Heart rate is well controlled at present. No changes in her medications at this time.

## 2011-08-03 NOTE — Progress Notes (Signed)
HPI: Mrs. Particia Nearing is a 78 y/o patient of Dr. Daleen Squibb we are seeing on follow up after recent hospitalization for atrial fibrillation with RVR , dyspnea ,pre-syncope and falls on 2 separate occasions. She was transferred to [facility] when she was found to have elevated troponin levels. Echocardiogram at [facility] showed significant change in her systolic fx, decreased to 34% with diffuse hypokinesis. She has subsequent L radial access cardiac catheterization, revealing nonobstructive CAD.  disease.  She was continued on coumadin as prior to admission, metoprolol and digoxin were started, cardizem was discontinued secondary to systolic dysfx. She states that she is feeling well and continuing PT at home. Unfortunately she is having a significant amount of bruising in her left arm, with left wrist pain. She has lost 4 lbs since discharge from [facility].  Allergies  Allergen Reactions  . Codeine Other (See Comments)    Makes patient sleepy.  Marland Kitchen Penicillins Rash    Current Outpatient Prescriptions  Medication Sig Dispense Refill  . acetaminophen (TYLENOL) 500 MG tablet Take 500-1,000 mg by mouth every 6 (six) hours as needed. Pain      . alendronate (FOSAMAX) 70 MG tablet Take 70 mg by mouth every 7 (seven) days. Take with a full glass of water on an empty stomach.       Marland Kitchen atorvastatin (LIPITOR) 10 MG tablet Take 1 tablet (10 mg total) by mouth daily.  30 tablet  6  . COUMADIN 5 MG tablet TAKE 1 TABLET BY MOUTH EVERY DAY , EXCEPT TAKE  1/2 TABLET DAILY ON MONDAYS AND THURSDAYS  60 tablet  0  . digoxin (LANOXIN) 0.125 MG tablet Take 125 mcg by mouth daily.        . febuxostat (ULORIC) 40 MG tablet Take 40 mg by mouth daily.      . folic acid (FOLVITE) 1 MG tablet Take 1 mg by mouth daily.      . furosemide (LASIX) 40 MG tablet Take 0.5 tablets (20 mg total) by mouth daily.  30 tablet  3  . gabapentin (NEURONTIN) 300 MG capsule Take 300 mg by mouth at bedtime.      Marland Kitchen HYDROcodone-acetaminophen (VICODIN) 5-500 MG per  tablet Take 1 tablet by mouth every 6 (six) hours as needed.        . hydroxychloroquine (PLAQUENIL) 200 MG tablet Take 200 mg by mouth daily.      Marland Kitchen losartan (COZAAR) 50 MG tablet Take 1 tablet (50 mg total) by mouth daily.  30 tablet  6  . metoprolol (TOPROL-XL) 50 MG 24 hr tablet TAKE  1/2 TABLET BY MOUTH TWICE DAILY  30 tablet  9  . potassium chloride SA (K-DUR,KLOR-CON) 20 MEQ tablet Take 1 tablet (20 mEq total) by mouth daily.  30 tablet  6  . predniSONE (DELTASONE) 5 MG tablet Take 5 mg by mouth daily.      . cephALEXin (KEFLEX) 500 MG capsule Take 1 capsule (500 mg total) by mouth 2 (two) times daily.  14 capsule  0    Past Medical History  Diagnosis Date  . Edema   . Atrial fibrillation     A.  Chronic Coumadin  . Neoplasm     unspecifide nature digestive system  . MVP (mitral valve prolapse)   . Hip fracture     left  . Hypertension   . Nonischemic cardiomyopathy     A.  07/23/11 - Echo: EF 35%;  B. 07/24/2011 - Cath: nonobs dzs ef 35%, 2+MR   .  Systolic CHF, chronic     A. Diag 07/2011, EF 35%  . Falls frequently     Past Surgical History  Procedure Date  . Appendectomy   . Abdominal hysterectomy   . Hernia repair 09/2008  . Hernia repair 04/17/11    lap ventral incisional hernia repair with mesh     [id number] of systems complete and found to be negative unless listed above PHYSICAL EXAM BP 118/75  Pulse 88  Resp 16  Ht 5\' 5"  (1.651 m)  Wt 180 lb (81.647 kg)  BMI 29.95 kg/m2  General: Well developed, well nourished, in no acute distress Head: Eyes PERRLA, No xanthomas.   Normal cephalic and atramatic  Lungs: Clear bilaterally to auscultation and percussion. Heart: HRIR without MRG.  Pulses are 2+ & equal.            No carotid bruit. No JVD.  No abdominal bruits. No femoral bruits. Abdomen: Bowel sounds are positive, abdomen soft and non-tender without masses or                  Hernia's noted. Msk:  Back normal, normal gait. Normal strength and tone for  age. Extremities: Significant ecchymosis of the left forearm with large grape sized hematoma proximal to the        Catheterization insertion site at left wrist. There is a central area of erythema noted which is warm to       touch and very sore. Difficult to palpate radial pulse. She able to move her wrist and hand but there is        soreness while doing so. Neuro: Alert and oriented X 3. Psych:  Good affect, responds appropriately    ASSESSMENT AND PLAN

## 2011-08-03 NOTE — Patient Instructions (Signed)
**Note De-Identified Tamsin Nader Obfuscation** Your provider recommends that you have a Ultra Sound of your left wrist. We will arrange.  Your physician recommends that you return for lab work in: today  Your physician has recommended you make the following change in your medication: start taking Keflex 500 mg twice daily for 7 days  Your physician recommends that you schedule a follow-up appointment on: August 09, 2011

## 2011-08-04 ENCOUNTER — Encounter: Payer: Self-pay | Admitting: Vascular Surgery

## 2011-08-04 ENCOUNTER — Ambulatory Visit: Payer: Medicare Other

## 2011-08-07 ENCOUNTER — Encounter: Payer: Self-pay | Admitting: Vascular Surgery

## 2011-08-07 ENCOUNTER — Ambulatory Visit (INDEPENDENT_AMBULATORY_CARE_PROVIDER_SITE_OTHER): Payer: Medicare Other | Admitting: Vascular Surgery

## 2011-08-07 VITALS — BP 171/94 | HR 87 | Resp 22 | Ht 65.0 in | Wt 182.0 lb

## 2011-08-07 DIAGNOSIS — I729 Aneurysm of unspecified site: Secondary | ICD-10-CM | POA: Insufficient documentation

## 2011-08-07 NOTE — Progress Notes (Signed)
Subjective:     Patient ID: Marissa [name], female   DOB: 06-Aug-1932, 78 y.o.   MRN: [id number]  HPI this 78 year old female had a cardiac cath performed by Dr. Tonny Bollman 07/21/2011. This was done via the left radial artery. The patient developed a "knot" at the puncture site following the procedure which has persisted. The pain seems to be improving but the knot has not resolved. Ultrasound was ordered and performed last week at any time hospital. This revealed the knot to represent either a thrombosed pseudoaneurysm of the left radial artery or a hematoma. The collection was anterior to the vessels. She was started on antibiotics by Dr. [name] and referred here for further evaluation. She denies pain or numbness in the left hand. She states that the discomfort is improving. She has used some ice packs on this.   Past Medical History  Diagnosis Date  . Edema   . Atrial fibrillation     A.  Chronic Coumadin  . Neoplasm     unspecifide nature digestive system  . MVP (mitral valve prolapse)   . Hip fracture     left  . Hypertension   . Nonischemic cardiomyopathy     A.  07/23/11 - Echo: EF 35%;  B. 07/24/2011 - Cath: nonobs dzs ef 35%, 2+MR   . Systolic CHF, chronic     A. Diag 07/2011, EF 35%  . Falls frequently     History  Substance Use Topics  . Smoking status: Never Smoker   . Smokeless tobacco: Never Used  . Alcohol Use: Yes     rare    No family history on file.  Allergies  Allergen Reactions  . Codeine Other (See Comments)    Makes patient sleepy.  Marland Kitchen Penicillins Rash    Current outpatient prescriptions:acetaminophen (TYLENOL) 500 MG tablet, Take 500-1,000 mg by mouth every 6 (six) hours as needed. Pain, Disp: , Rfl: ;  alendronate (FOSAMAX) 70 MG tablet, Take 70 mg by mouth every 7 (seven) days. Take with a full glass of water on an empty stomach. , Disp: , Rfl: ;  atorvastatin (LIPITOR) 10 MG tablet, Take 1 tablet (10 mg total) by mouth daily., Disp: 30 tablet, Rfl:  6 cephALEXin (KEFLEX) 500 MG capsule, Take 1 capsule (500 mg total) by mouth 2 (two) times daily., Disp: 14 capsule, Rfl: 0;  COUMADIN 5 MG tablet, TAKE 1 TABLET BY MOUTH EVERY DAY , EXCEPT TAKE  1/2 TABLET DAILY ON MONDAYS AND THURSDAYS, Disp: 60 tablet, Rfl: 0;  digoxin (LANOXIN) 0.125 MG tablet, Take 125 mcg by mouth daily.  , Disp: , Rfl: ;  febuxostat (ULORIC) 40 MG tablet, Take 40 mg by mouth daily., Disp: , Rfl:  folic acid (FOLVITE) 1 MG tablet, Take 1 mg by mouth daily., Disp: , Rfl: ;  furosemide (LASIX) 40 MG tablet, Take 0.5 tablets (20 mg total) by mouth daily., Disp: 30 tablet, Rfl: 3;  gabapentin (NEURONTIN) 300 MG capsule, Take 300 mg by mouth at bedtime., Disp: , Rfl: ;  HYDROcodone-acetaminophen (VICODIN) 5-500 MG per tablet, Take 1 tablet by mouth every 6 (six) hours as needed.  , Disp: , Rfl:  hydroxychloroquine (PLAQUENIL) 200 MG tablet, Take 200 mg by mouth daily., Disp: , Rfl: ;  losartan (COZAAR) 50 MG tablet, Take 1 tablet (50 mg total) by mouth daily., Disp: 30 tablet, Rfl: 6;  metoprolol (TOPROL-XL) 50 MG 24 hr tablet, TAKE  1/2 TABLET BY MOUTH TWICE DAILY, Disp: 30 tablet, Rfl: 9;  potassium chloride SA (K-DUR,KLOR-CON) 20 MEQ tablet, Take 1 tablet (20 mEq total) by mouth daily., Disp: 30 tablet, Rfl: 6 predniSONE (DELTASONE) 5 MG tablet, Take 5 mg by mouth daily., Disp: , Rfl:   BP 171/94  Pulse 87  Resp 22  Ht 5\' 5"  (1.651 m)  Wt 182 lb (82.555 kg)  BMI 30.29 kg/m2  Body mass index is 30.29 kg/(m^2).        Review of Systems denies chest pain but does complain of dyspnea on exertion. No PND or orthopnea or hemoptysis. Denies claudication.    Objective:   Physical Exam blood pressure 171/94 heart rate 87 respirations 22 Gen. well-developed well-nourished elderly female in no apparent stress alert and oriented x3 Lungs no rhonchi or wheezing Cardiovascular regular rhythm no murmurs carotid pulses 3+ no audible bruits Abdomen soft nontender with no  masses Neurologic normal Musculoskeletal free of major deformities Lower extremities 3+ femoral and dorsalis pedis pulses palpable bilaterally. 1+ edema both ankles. Upper extremities 3+ brachial radial and ulnar pulses bilaterally. There is a mass over the left radial artery measuring 2.5 x 2.5 cm. It is not fluctuant nor pulsatile. It is mildly tender. There is no erythema but some mild bruising.  I listened with the Doppler and there is excellent radial and ulnar flow in the left wrist with a well-perfused left hand  I reviewed the ultrasound report from [facility]    Assessment:     Hematoma following cardiac catheterization via left radial artery approach versus thrombosed pseudoaneurysm left radial artery with excellent flow to left hand    Plan:     No treatment indicated. This should resolve over several months. As long as she maintains excellent flow to the left hand would allow this to resolve on its own. I will see her back in 3 months with repeat duplex scan left radial artery and less symptoms worsen in the interem. We'll continue course of antibiotics and then discontinue

## 2011-08-09 ENCOUNTER — Ambulatory Visit (INDEPENDENT_AMBULATORY_CARE_PROVIDER_SITE_OTHER): Payer: Medicare Other | Admitting: Cardiology

## 2011-08-09 ENCOUNTER — Encounter: Payer: Self-pay | Admitting: Cardiology

## 2011-08-09 VITALS — BP 152/87 | HR 84 | Resp 18 | Ht 65.0 in | Wt 181.0 lb

## 2011-08-09 DIAGNOSIS — I4891 Unspecified atrial fibrillation: Secondary | ICD-10-CM

## 2011-08-09 DIAGNOSIS — I428 Other cardiomyopathies: Secondary | ICD-10-CM

## 2011-08-09 DIAGNOSIS — I5022 Chronic systolic (congestive) heart failure: Secondary | ICD-10-CM

## 2011-08-09 DIAGNOSIS — R609 Edema, unspecified: Secondary | ICD-10-CM

## 2011-08-09 DIAGNOSIS — I729 Aneurysm of unspecified site: Secondary | ICD-10-CM

## 2011-08-09 DIAGNOSIS — R296 Repeated falls: Secondary | ICD-10-CM

## 2011-08-09 DIAGNOSIS — M7981 Nontraumatic hematoma of soft tissue: Secondary | ICD-10-CM

## 2011-08-09 DIAGNOSIS — Z7901 Long term (current) use of anticoagulants: Secondary | ICD-10-CM

## 2011-08-09 DIAGNOSIS — Z9181 History of falling: Secondary | ICD-10-CM

## 2011-08-09 NOTE — Assessment & Plan Note (Signed)
Improved. No change in medical therapy. 

## 2011-08-09 NOTE — Assessment & Plan Note (Signed)
Stable. No change in therapy.  Return to office in 6 months.

## 2011-08-09 NOTE — Assessment & Plan Note (Signed)
Fall risk assessment done and preventative strategies reviewed. Patient advised if she hits her head she must get medical attention immediately.

## 2011-08-09 NOTE — Patient Instructions (Signed)
Your physician recommends that you schedule a follow-up appointment in: 6 months  

## 2011-08-09 NOTE — Progress Notes (Signed)
HPI Marissa [name] returns for followup of her history of chronic A. Fib with a recent rapid ventricular rate, now well-controlled, nonischemic cardiomyopathy with chronic systolic heart failure, and nonobstructive coronary artery disease.  She receives all Marissa [name] for a left radial pseudoaneurysm. She had some cellulitis and has been treated with Keflex improved. She denies any pain at that site.  Past Medical History  Diagnosis Date  . Edema   . Atrial fibrillation     A.  Chronic Coumadin  . Neoplasm     unspecifide nature digestive system  . MVP (mitral valve prolapse)   . Hip fracture     left  . Hypertension   . Nonischemic cardiomyopathy     A.  07/23/11 - Echo: EF 35%;  B. 07/24/2011 - Cath: nonobs dzs ef 35%, 2+MR   . Systolic CHF, chronic     A. Diag 07/2011, EF 35%  . Falls frequently     Current Outpatient Prescriptions  Medication Sig Dispense Refill  . acetaminophen (TYLENOL) 500 MG tablet Take 500-1,000 mg by mouth every 6 (six) hours as needed. Pain      . alendronate (FOSAMAX) 70 MG tablet Take 70 mg by mouth every 7 (seven) days. Take with a full glass of water on an empty stomach.       Marland Kitchen atorvastatin (LIPITOR) 10 MG tablet Take 1 tablet (10 mg total) by mouth daily.  30 tablet  6  . cephALEXin (KEFLEX) 500 MG capsule Take 1 capsule (500 mg total) by mouth 2 (two) times daily.  14 capsule  0  . COUMADIN 5 MG tablet TAKE 1 TABLET BY MOUTH EVERY DAY , EXCEPT TAKE  1/2 TABLET DAILY ON MONDAYS AND THURSDAYS  60 tablet  0  . digoxin (LANOXIN) 0.125 MG tablet Take 125 mcg by mouth daily.        . febuxostat (ULORIC) 40 MG tablet Take 40 mg by mouth daily.      . folic acid (FOLVITE) 1 MG tablet Take 1 mg by mouth daily.      . furosemide (LASIX) 40 MG tablet Take 0.5 tablets (20 mg total) by mouth daily.  30 tablet  3  . gabapentin (NEURONTIN) 300 MG capsule Take 300 mg by mouth at bedtime.      Marland Kitchen HYDROcodone-acetaminophen (VICODIN) 5-500 MG per tablet Take 1 tablet by  mouth every 6 (six) hours as needed.        . hydroxychloroquine (PLAQUENIL) 200 MG tablet Take 200 mg by mouth daily.      Marland Kitchen losartan (COZAAR) 50 MG tablet Take 1 tablet (50 mg total) by mouth daily.  30 tablet  6  . metoprolol (TOPROL-XL) 50 MG 24 hr tablet TAKE  1/2 TABLET BY MOUTH TWICE DAILY  30 tablet  9  . potassium chloride SA (K-DUR,KLOR-CON) 20 MEQ tablet Take 1 tablet (20 mEq total) by mouth daily.  30 tablet  6  . predniSONE (DELTASONE) 5 MG tablet Take 5 mg by mouth daily.        Allergies  Allergen Reactions  . Codeine Other (See Comments)    Makes patient sleepy.  Marland Kitchen Penicillins Rash    No family history on file.  History   Social History  . Marital Status: Widowed    Spouse Name: N/A    Number of Children: N/A  . Years of Education: N/A   Occupational History  . retired    Social History Main Topics  . Smoking status: Never  Smoker   . Smokeless tobacco: Never Used  . Alcohol Use: Yes     rare  . Drug Use: No  . Sexually Active: Not on file   Other Topics Concern  . Not on file   Social History Narrative  . No narrative on file    ROS ALL NEGATIVE EXCEPT THOSE NOTED IN HPI  PE  General Appearance: well developed, well nourished in no acute distress, obese HEENT: symmetrical face, PERRLA, good dentition  Neck: no JVD, thyromegaly, or adenopathy, trachea midline Chest: symmetric without deformity Cardiac: PMI non-displaced, irregular rate and rhythm normal S1, S2, no gallop or murmur Lung: clear to ausculation and percussion Vascular: all pulses full without bruits  Abdominal: nondistended, nontender, good bowel sounds, no HSM, no bruits Extremities: no cyanosis, clubbing or edema, no sign of DVT, no varicosities, left radial artery pseudoaneurysm, normal color perfusion in hand  Skin: normal color, no rashes Neuro: alert and oriented x 3, non-focal Pysch: normal affect  EKG  BMET    Component Value Date/Time   NA 145 07/25/2011 0635   K  3.8 07/25/2011 0635   CL 109 07/25/2011 0635   CO2 26 07/25/2011 0635   GLUCOSE 90 07/25/2011 0635   BUN 27* 07/25/2011 0635   CREATININE 0.94 07/25/2011 0635   CREATININE 1.04 11/09/2010 1227   CALCIUM 9.5 07/25/2011 0635   GFRNONAA 57* 07/25/2011 0635   GFRAA 67* 07/25/2011 0635    Lipid Panel     Component Value Date/Time   CHOL 132 07/23/2011 0346   TRIG 83 07/23/2011 0346   HDL 49 07/23/2011 0346   CHOLHDL 2.7 07/23/2011 0346   VLDL 17 07/23/2011 0346   LDLCALC 66 07/23/2011 0346    CBC    Component Value Date/Time   WBC 11.4* 08/03/2011 1149   RBC 4.52 08/03/2011 1149   HGB 13.0 08/03/2011 1149   HCT 41.8 08/03/2011 1149   PLT 393 08/03/2011 1149   MCV 92.5 08/03/2011 1149   MCH 28.8 08/03/2011 1149   MCHC 31.1 08/03/2011 1149   RDW 15.6* 08/03/2011 1149   LYMPHSABS 1.3 08/03/2011 1149   MONOABS 0.6 08/03/2011 1149   EOSABS 0.1 08/03/2011 1149   BASOSABS 0.1 08/03/2011 1149

## 2011-08-24 ENCOUNTER — Ambulatory Visit (INDEPENDENT_AMBULATORY_CARE_PROVIDER_SITE_OTHER): Payer: Medicare Other | Admitting: *Deleted

## 2011-08-24 DIAGNOSIS — Z7901 Long term (current) use of anticoagulants: Secondary | ICD-10-CM

## 2011-08-24 DIAGNOSIS — I4891 Unspecified atrial fibrillation: Secondary | ICD-10-CM

## 2011-08-24 LAB — POCT INR: INR: 2.6

## 2011-08-28 ENCOUNTER — Other Ambulatory Visit: Payer: Self-pay | Admitting: Cardiology

## 2011-08-29 ENCOUNTER — Other Ambulatory Visit: Payer: Self-pay

## 2011-08-29 MED ORDER — WARFARIN SODIUM 5 MG PO TABS: 5.0000 mg | ORAL_TABLET | ORAL | Status: DC

## 2011-08-31 ENCOUNTER — Other Ambulatory Visit (HOSPITAL_COMMUNITY): Payer: Self-pay | Admitting: Internal Medicine

## 2011-08-31 DIAGNOSIS — Z139 Encounter for screening, unspecified: Secondary | ICD-10-CM

## 2011-09-07 ENCOUNTER — Ambulatory Visit (HOSPITAL_COMMUNITY)
Admission: RE | Admit: 2011-09-07 | Discharge: 2011-09-07 | Disposition: A | Discharge status: Home / Self Care | Payer: Medicare Other | Source: Ambulatory Visit | Attending: Internal Medicine | Admitting: Internal Medicine

## 2011-09-07 DIAGNOSIS — Z1231 Encounter for screening mammogram for malignant neoplasm of breast: Secondary | ICD-10-CM | POA: Insufficient documentation

## 2011-09-07 DIAGNOSIS — Z139 Encounter for screening, unspecified: Secondary | ICD-10-CM

## 2011-09-16 ENCOUNTER — Other Ambulatory Visit: Payer: Self-pay | Admitting: Cardiology

## 2011-09-21 ENCOUNTER — Encounter (INDEPENDENT_AMBULATORY_CARE_PROVIDER_SITE_OTHER): Payer: Medicare Other

## 2011-09-21 DIAGNOSIS — Z7901 Long term (current) use of anticoagulants: Secondary | ICD-10-CM

## 2011-09-21 DIAGNOSIS — I4891 Unspecified atrial fibrillation: Secondary | ICD-10-CM

## 2011-09-27 ENCOUNTER — Ambulatory Visit (INDEPENDENT_AMBULATORY_CARE_PROVIDER_SITE_OTHER): Payer: Medicare Other | Admitting: *Deleted

## 2011-09-27 DIAGNOSIS — Z7901 Long term (current) use of anticoagulants: Secondary | ICD-10-CM

## 2011-09-27 DIAGNOSIS — I4891 Unspecified atrial fibrillation: Secondary | ICD-10-CM

## 2011-10-31 ENCOUNTER — Other Ambulatory Visit: Payer: Self-pay | Admitting: *Deleted

## 2011-10-31 DIAGNOSIS — M79603 Pain in arm, unspecified: Secondary | ICD-10-CM

## 2011-10-31 DIAGNOSIS — Z9889 Other specified postprocedural states: Secondary | ICD-10-CM

## 2011-11-06 ENCOUNTER — Encounter: Payer: Self-pay | Admitting: Vascular Surgery

## 2011-11-07 ENCOUNTER — Encounter (INDEPENDENT_AMBULATORY_CARE_PROVIDER_SITE_OTHER): Payer: Medicare Other | Admitting: *Deleted

## 2011-11-07 ENCOUNTER — Ambulatory Visit (INDEPENDENT_AMBULATORY_CARE_PROVIDER_SITE_OTHER): Payer: Medicare Other | Admitting: Vascular Surgery

## 2011-11-07 ENCOUNTER — Encounter: Payer: Self-pay | Admitting: Vascular Surgery

## 2011-11-07 VITALS — BP 147/82 | HR 74 | Temp 98.2°F | Ht 65.0 in | Wt 190.0 lb

## 2011-11-07 DIAGNOSIS — Z9889 Other specified postprocedural states: Secondary | ICD-10-CM

## 2011-11-07 DIAGNOSIS — Z48812 Encounter for surgical aftercare following surgery on the circulatory system: Secondary | ICD-10-CM

## 2011-11-07 DIAGNOSIS — M79603 Pain in arm, unspecified: Secondary | ICD-10-CM

## 2011-11-07 DIAGNOSIS — M79609 Pain in unspecified limb: Secondary | ICD-10-CM

## 2011-11-07 DIAGNOSIS — S55109A Unspecified injury of radial artery at forearm level, unspecified arm, initial encounter: Secondary | ICD-10-CM

## 2011-11-07 DIAGNOSIS — I729 Aneurysm of unspecified site: Secondary | ICD-10-CM | POA: Insufficient documentation

## 2011-11-07 NOTE — Progress Notes (Signed)
Subjective:     Patient ID: Marissa [name], female   DOB: [birth year], 78 y.o.   MRN: [id number]  HPI this 78 year old female returns today for continued followup regarding the small nodule over her left radial artery which occurred following cardiac catheterization by Dr. Excell Seltzer in January of 2013. Previous duplex scan revealed no evidence of pseudo-aneurysm. She initially had significant pain but that has now resolved. She has been fairly stable from a cardiac standpoint. She does take Coumadin for atrial fibrillation.  Past Medical History  Diagnosis Date  . Edema   . Atrial fibrillation     A.  Chronic Coumadin  . Neoplasm     unspecifide nature digestive system  . MVP (mitral valve prolapse)   . Hip fracture     left  . Hypertension   . Nonischemic cardiomyopathy     A.  07/23/11 - Echo: EF 35%;  B. 07/24/2011 - Cath: nonobs dzs ef 35%, 2+MR   . Systolic CHF, chronic     A. Diag 07/2011, EF 35%  . Falls frequently     History  Substance Use Topics  . Smoking status: Never Smoker   . Smokeless tobacco: Never Used  . Alcohol Use: Yes     rare    History reviewed. No pertinent family history.  Allergies  Allergen Reactions  . Codeine Other (See Comments)    Makes patient sleepy.  Marland Kitchen Penicillins Rash    Current outpatient prescriptions:acetaminophen (TYLENOL) 500 MG tablet, Take 500-1,000 mg by mouth every 6 (six) hours as needed. Pain, Disp: , Rfl: ;  alendronate (FOSAMAX) 70 MG tablet, Take 70 mg by mouth every 7 (seven) days. Take with a full glass of water on an empty stomach. , Disp: , Rfl: ;  atorvastatin (LIPITOR) 10 MG tablet, Take 1 tablet (10 mg total) by mouth daily., Disp: 30 tablet, Rfl: 6 digoxin (LANOXIN) 0.125 MG tablet, TAKE 1 TABLET BY MOUTH EVERY DAY, Disp: 90 tablet, Rfl: 3;  folic acid (FOLVITE) 1 MG tablet, Take 1 mg by mouth daily., Disp: , Rfl: ;  furosemide (LASIX) 40 MG tablet, Take 0.5 tablets (20 mg total) by mouth daily., Disp: 30 tablet, Rfl: 3;   gabapentin (NEURONTIN) 300 MG capsule, Take 300 mg by mouth at bedtime., Disp: , Rfl:  HYDROcodone-acetaminophen (VICODIN) 5-500 MG per tablet, Take 1 tablet by mouth every 6 (six) hours as needed.  , Disp: , Rfl: ;  hydroxychloroquine (PLAQUENIL) 200 MG tablet, Take 200 mg by mouth daily., Disp: , Rfl: ;  losartan (COZAAR) 50 MG tablet, Take 1 tablet (50 mg total) by mouth daily., Disp: 30 tablet, Rfl: 6;  metoprolol (TOPROL-XL) 50 MG 24 hr tablet, TAKE  1/2 TABLET BY MOUTH TWICE DAILY, Disp: 30 tablet, Rfl: 9 potassium chloride SA (K-DUR,KLOR-CON) 20 MEQ tablet, Take 1 tablet (20 mEq total) by mouth daily., Disp: 30 tablet, Rfl: 6;  predniSONE (DELTASONE) 5 MG tablet, Take 5 mg by mouth daily., Disp: , Rfl: ;  warfarin (COUMADIN) 5 MG tablet, Take 1 tablet (5 mg total) by mouth as directed., Disp: 60 tablet, Rfl: 1;  febuxostat (ULORIC) 40 MG tablet, Take 40 mg by mouth daily., Disp: , Rfl:   BP 147/82  Pulse 74  Temp(Src) 98.2 F (36.8 C) (Oral)  Ht 5\' 5"  (1.651 m)  Wt 190 lb (86.183 kg)  BMI 31.62 kg/m2  SpO2 96%  Body mass index is 31.62 kg/(m^2).          Review of Systems  denies chest pain but does have mild dyspnea on exertion. Ambulation is on stable since her back surgery 2 years ago. Walks with a cane.    Objective:   Physical Exam blood pressure 147/82 heart rate 74 respirations 16 General well-developed well-nourished female in no apparent distress alert and oriented x3 HEENT normal for age Lungs no rhonchi or wheezing  Upper extremity exam reveals 3+ brachial and radial pulses bilaterally. There is a very tiny nodule palpable over the left radial artery measuring less than 0.5 cm in diameter. This is not pulsatile. There is no erythema or skin changes above this.  Today I ordered a duplex scan of the left radial artery. There is triphasic flow in the left radial artery and it appears normal. There is a small nodule separate from the vessel measuring 0.47 x 0.56 in  diameter.    Assessment:     Resolving hematoma left wrist over radial artery-no evidence pseudoaneurysm    Plan:     No further followup indicated that should resolve with time

## 2011-11-08 ENCOUNTER — Ambulatory Visit (INDEPENDENT_AMBULATORY_CARE_PROVIDER_SITE_OTHER): Payer: Medicare Other | Admitting: *Deleted

## 2011-11-08 DIAGNOSIS — Z7901 Long term (current) use of anticoagulants: Secondary | ICD-10-CM

## 2011-11-08 DIAGNOSIS — I4891 Unspecified atrial fibrillation: Secondary | ICD-10-CM

## 2011-11-14 NOTE — Procedures (Unsigned)
VASCULAR LAB EXAM  INDICATION:  Followup lump at left wrist following cardiac catheterization done via the left radial artery 07/21/2011  HISTORY: Diabetes:  No Cardiac:  Mitral valve prolapse Hypertension:  Yes  EXAM:  Triphasic Doppler waveforms of the left radial and brachial arteries.  No evidence of pseudoaneurysm or hematoma. Note:  There was a nodule measuring 0.47 x 0.56 cm that was seen when scanning directly over the previous lump location.  IMPRESSION:  Normal left radial artery duplex exam with triphasic waveforms noted.  No evidence of pseudoaneurysm or hematoma.  ___________________________________________ Quita Skye. Hart Rochester, M.D.  EM/MEDQ  D:  11/07/2011  T:  11/07/2011  Job:  [id number]

## 2011-12-20 ENCOUNTER — Ambulatory Visit (INDEPENDENT_AMBULATORY_CARE_PROVIDER_SITE_OTHER): Payer: Medicare Other | Admitting: *Deleted

## 2011-12-20 DIAGNOSIS — I4891 Unspecified atrial fibrillation: Secondary | ICD-10-CM

## 2011-12-20 DIAGNOSIS — Z7901 Long term (current) use of anticoagulants: Secondary | ICD-10-CM

## 2011-12-20 LAB — POCT INR: INR: 2.7

## 2012-01-22 ENCOUNTER — Other Ambulatory Visit: Payer: Self-pay | Admitting: Cardiology

## 2012-01-31 ENCOUNTER — Ambulatory Visit (INDEPENDENT_AMBULATORY_CARE_PROVIDER_SITE_OTHER): Payer: Medicare Other | Admitting: *Deleted

## 2012-01-31 DIAGNOSIS — Z7901 Long term (current) use of anticoagulants: Secondary | ICD-10-CM

## 2012-01-31 DIAGNOSIS — I4891 Unspecified atrial fibrillation: Secondary | ICD-10-CM

## 2012-01-31 LAB — POCT INR: INR: 2.3

## 2012-02-02 ENCOUNTER — Ambulatory Visit (INDEPENDENT_AMBULATORY_CARE_PROVIDER_SITE_OTHER): Payer: Medicare Other | Admitting: Cardiology

## 2012-02-02 ENCOUNTER — Encounter: Payer: Self-pay | Admitting: Cardiology

## 2012-02-02 VITALS — BP 123/76 | HR 75 | Ht 65.0 in | Wt 188.1 lb

## 2012-02-02 DIAGNOSIS — Z7901 Long term (current) use of anticoagulants: Secondary | ICD-10-CM

## 2012-02-02 DIAGNOSIS — I428 Other cardiomyopathies: Secondary | ICD-10-CM

## 2012-02-02 DIAGNOSIS — R609 Edema, unspecified: Secondary | ICD-10-CM

## 2012-02-02 DIAGNOSIS — I4891 Unspecified atrial fibrillation: Secondary | ICD-10-CM

## 2012-02-02 DIAGNOSIS — I5022 Chronic systolic (congestive) heart failure: Secondary | ICD-10-CM

## 2012-02-02 MED ORDER — FUROSEMIDE 40 MG PO TABS: 40.0000 mg | ORAL_TABLET | Freq: Every day | ORAL | Status: DC

## 2012-02-02 NOTE — Patient Instructions (Addendum)
Your physician has recommended you make the following change in your medication: Increase Furosemide (lasix) to 1 tablet daily.  When you see your primary care doctor the first of August for lab work Have your potassium level checked.  Your physician wants you to follow-up in: 6 months with Dr. Daleen Squibb. You will receive a reminder letter in the mail two months in advance. If you don't receive a letter, please call our office to schedule the follow-up appointment.

## 2012-02-02 NOTE — Assessment & Plan Note (Signed)
She is worse clinically with increased shortness of breath , perhaps some orthopnea she first lays down as well as increased edema. I've increased her Lasix to 40 mg a day. Dr. [name] her primary care physician will check blood work she tells me the first week of August. We will ask him to check a metabolic panel to followup on her potassium and renal function. In addition I would like you to check digoxin level at the same drawl for patient convenience and comfort.  I'll see her back in 6 months. I'll see her sooner if she begins to decompensate or does not respond to these changes appropriately.

## 2012-02-02 NOTE — Progress Notes (Signed)
HPI Marissa [name] returns today for followup of her nonischemic cardiomyopathy, history of chronic A. fib, mitral valve prolapse, anticoagulation, as well as chronic systolic heart failure. She has nonobstructive coronary disease by cath earlier this year.  She's noted increased shortness of breath is getting around. She also gets a little short of breath when she first lies down at night. Her swelling has gotten worse in her legs.  She denies any angina or chest pain. She does not know she's in atrial fib.  She's very compliant with her meds. Her INRs have been therapeutic.  Past Medical History  Diagnosis Date  . Edema   . Atrial fibrillation     A.  Chronic Coumadin  . Neoplasm     unspecifide nature digestive system  . MVP (mitral valve prolapse)   . Hip fracture     left  . Hypertension   . Nonischemic cardiomyopathy     A.  07/23/11 - Echo: EF 35%;  B. 07/24/2011 - Cath: nonobs dzs ef 35%, 2+MR   . Systolic CHF, chronic     A. Diag 07/2011, EF 35%  . Falls frequently     Current Outpatient Prescriptions  Medication Sig Dispense Refill  . acetaminophen (TYLENOL) 500 MG tablet Take 500-1,000 mg by mouth every 6 (six) hours as needed. Pain      . alendronate (FOSAMAX) 70 MG tablet Take 70 mg by mouth every 7 (seven) days. Take with a full glass of water on an empty stomach.       Marland Kitchen allopurinol (ZYLOPRIM) 100 MG tablet Take 100 mg by mouth daily.      Marland Kitchen atorvastatin (LIPITOR) 10 MG tablet Take 1 tablet (10 mg total) by mouth daily.  30 tablet  6  . COUMADIN 5 MG tablet TAKE 1 TABLET BY MOUTH AS DIRECTED  60 tablet  0  . digoxin (LANOXIN) 0.125 MG tablet TAKE 1 TABLET BY MOUTH EVERY DAY  90 tablet  3  . folic acid (FOLVITE) 1 MG tablet Take 1 mg by mouth daily.      . furosemide (LASIX) 40 MG tablet Take 0.5 tablets (20 mg total) by mouth daily.  30 tablet  3  . gabapentin (NEURONTIN) 300 MG capsule Take 300 mg by mouth at bedtime.      Marland Kitchen HYDROcodone-acetaminophen (VICODIN) 5-500 MG  per tablet Take 1 tablet by mouth daily.       . hydroxychloroquine (PLAQUENIL) 200 MG tablet Take 200 mg by mouth daily.      Marland Kitchen losartan (COZAAR) 50 MG tablet Take 1 tablet (50 mg total) by mouth daily.  30 tablet  6  . metoprolol (TOPROL-XL) 50 MG 24 hr tablet TAKE  1/2 TABLET BY MOUTH TWICE DAILY  30 tablet  9  . predniSONE (DELTASONE) 5 MG tablet Take 5 mg by mouth daily.        Allergies  Allergen Reactions  . Codeine Other (See Comments)    Makes patient sleepy.  Marland Kitchen Penicillins Rash    No family history on file.  History   Social History  . Marital Status: Widowed    Spouse Name: N/A    Number of Children: N/A  . Years of Education: N/A   Occupational History  . retired    Social History Main Topics  . Smoking status: Never Smoker   . Smokeless tobacco: Never Used  . Alcohol Use: Yes     rare  . Drug Use: No  . Sexually Active: Not  on file   Other Topics Concern  . Not on file   Social History Narrative  . No narrative on file    ROS ALL NEGATIVE EXCEPT THOSE NOTED IN HPI  PE  General Appearance: well developed, well nourished in no acute distress, overweight HEENT: symmetrical face, PERRLA, good dentition  Neck: no JVD, thyromegaly, or adenopathy, trachea midline Chest: symmetric without deformity Cardiac: PMI non-displaced, irregular rate and rhythm, normal S1, S2, no gallop or murmur Lung: clear to ausculation and percussion Vascular: all pulses full without bruits  Abdominal: nondistended, nontender, good bowel sounds, no HSM, no bruits Extremities: no cyanosis, clubbing , 3+ pitting edema, no sign of DVT, no varicosities  Skin: normal color, no rashes Neuro: alert and oriented x 3, non-focal Pysch: normal affect  EKG  chronic A. fib, well-controlled ventricular rate left axis deviation, nonspecific ST segment changes  BMET    Component Value Date/Time   NA 145 07/25/2011 0635   K 3.8 07/25/2011 0635   CL 109 07/25/2011 0635   CO2 26 07/25/2011  0635   GLUCOSE 90 07/25/2011 0635   BUN 27* 07/25/2011 0635   CREATININE 0.94 07/25/2011 0635   CREATININE 1.04 11/09/2010 1227   CALCIUM 9.5 07/25/2011 0635   GFRNONAA 57* 07/25/2011 0635   GFRAA 67* 07/25/2011 0635    Lipid Panel     Component Value Date/Time   CHOL 132 07/23/2011 0346   TRIG 83 07/23/2011 0346   HDL 49 07/23/2011 0346   CHOLHDL 2.7 07/23/2011 0346   VLDL 17 07/23/2011 0346   LDLCALC 66 07/23/2011 0346    CBC    Component Value Date/Time   WBC 11.4* 08/03/2011 1149   RBC 4.52 08/03/2011 1149   HGB 13.0 08/03/2011 1149   HCT 41.8 08/03/2011 1149   PLT 393 08/03/2011 1149   MCV 92.5 08/03/2011 1149   MCH 28.8 08/03/2011 1149   MCHC 31.1 08/03/2011 1149   RDW 15.6* 08/03/2011 1149   LYMPHSABS 1.3 08/03/2011 1149   MONOABS 0.6 08/03/2011 1149   EOSABS 0.1 08/03/2011 1149   BASOSABS 0.1 08/03/2011 1149

## 2012-02-23 ENCOUNTER — Other Ambulatory Visit (HOSPITAL_COMMUNITY): Payer: Self-pay | Admitting: Nurse Practitioner

## 2012-03-13 ENCOUNTER — Ambulatory Visit (INDEPENDENT_AMBULATORY_CARE_PROVIDER_SITE_OTHER): Payer: Medicare Other | Admitting: *Deleted

## 2012-03-13 DIAGNOSIS — Z7901 Long term (current) use of anticoagulants: Secondary | ICD-10-CM

## 2012-03-13 DIAGNOSIS — I4891 Unspecified atrial fibrillation: Secondary | ICD-10-CM

## 2012-03-13 LAB — POCT INR: INR: 2.6

## 2012-03-16 ENCOUNTER — Other Ambulatory Visit (HOSPITAL_COMMUNITY): Payer: Self-pay | Admitting: Nurse Practitioner

## 2012-04-02 ENCOUNTER — Other Ambulatory Visit: Payer: Self-pay | Admitting: *Deleted

## 2012-04-02 ENCOUNTER — Other Ambulatory Visit: Payer: Self-pay | Admitting: Cardiology

## 2012-04-02 MED ORDER — WARFARIN SODIUM 5 MG PO TABS: 5.0000 mg | ORAL_TABLET | ORAL | Status: DC

## 2012-04-24 ENCOUNTER — Ambulatory Visit (INDEPENDENT_AMBULATORY_CARE_PROVIDER_SITE_OTHER): Payer: Medicare Other | Admitting: *Deleted

## 2012-04-24 DIAGNOSIS — I4891 Unspecified atrial fibrillation: Secondary | ICD-10-CM

## 2012-04-24 DIAGNOSIS — Z7901 Long term (current) use of anticoagulants: Secondary | ICD-10-CM

## 2012-04-29 ENCOUNTER — Ambulatory Visit (INDEPENDENT_AMBULATORY_CARE_PROVIDER_SITE_OTHER): Payer: Medicare Other | Admitting: *Deleted

## 2012-04-29 DIAGNOSIS — I4891 Unspecified atrial fibrillation: Secondary | ICD-10-CM

## 2012-04-29 DIAGNOSIS — Z7901 Long term (current) use of anticoagulants: Secondary | ICD-10-CM

## 2012-05-05 ENCOUNTER — Encounter (HOSPITAL_COMMUNITY): Payer: Self-pay

## 2012-05-05 ENCOUNTER — Emergency Department (HOSPITAL_COMMUNITY): Payer: Medicare Other

## 2012-05-05 ENCOUNTER — Inpatient Hospital Stay (HOSPITAL_COMMUNITY)
Admission: EM | Admit: 2012-05-05 | Discharge: 2012-05-08 | DRG: 603 | Disposition: A | Discharge status: Home / Self Care | Payer: Medicare Other | Attending: Internal Medicine | Admitting: Internal Medicine

## 2012-05-05 DIAGNOSIS — Z90710 Acquired absence of both cervix and uterus: Secondary | ICD-10-CM

## 2012-05-05 DIAGNOSIS — I4891 Unspecified atrial fibrillation: Secondary | ICD-10-CM | POA: Diagnosis present

## 2012-05-05 DIAGNOSIS — R609 Edema, unspecified: Secondary | ICD-10-CM

## 2012-05-05 DIAGNOSIS — Z8500 Personal history of malignant neoplasm of unspecified digestive organ: Secondary | ICD-10-CM

## 2012-05-05 DIAGNOSIS — H7190 Unspecified cholesteatoma, unspecified ear: Secondary | ICD-10-CM | POA: Diagnosis present

## 2012-05-05 DIAGNOSIS — Z886 Allergy status to analgesic agent status: Secondary | ICD-10-CM

## 2012-05-05 DIAGNOSIS — Z79899 Other long term (current) drug therapy: Secondary | ICD-10-CM

## 2012-05-05 DIAGNOSIS — L02818 Cutaneous abscess of other sites: Principal | ICD-10-CM | POA: Diagnosis present

## 2012-05-05 DIAGNOSIS — L03211 Cellulitis of face: Secondary | ICD-10-CM | POA: Diagnosis present

## 2012-05-05 DIAGNOSIS — I509 Heart failure, unspecified: Secondary | ICD-10-CM | POA: Diagnosis present

## 2012-05-05 DIAGNOSIS — L0201 Cutaneous abscess of face: Secondary | ICD-10-CM | POA: Diagnosis present

## 2012-05-05 DIAGNOSIS — Z7901 Long term (current) use of anticoagulants: Secondary | ICD-10-CM

## 2012-05-05 DIAGNOSIS — I5022 Chronic systolic (congestive) heart failure: Secondary | ICD-10-CM | POA: Diagnosis present

## 2012-05-05 DIAGNOSIS — Z880 Allergy status to penicillin: Secondary | ICD-10-CM

## 2012-05-05 DIAGNOSIS — I10 Essential (primary) hypertension: Secondary | ICD-10-CM | POA: Diagnosis present

## 2012-05-05 DIAGNOSIS — Z9181 History of falling: Secondary | ICD-10-CM

## 2012-05-05 DIAGNOSIS — H60399 Other infective otitis externa, unspecified ear: Secondary | ICD-10-CM | POA: Diagnosis present

## 2012-05-05 DIAGNOSIS — IMO0002 Reserved for concepts with insufficient information to code with codable children: Secondary | ICD-10-CM

## 2012-05-05 DIAGNOSIS — L03811 Cellulitis of head [any part, except face]: Secondary | ICD-10-CM

## 2012-05-05 DIAGNOSIS — I428 Other cardiomyopathies: Secondary | ICD-10-CM | POA: Diagnosis present

## 2012-05-05 DIAGNOSIS — I059 Rheumatic mitral valve disease, unspecified: Secondary | ICD-10-CM | POA: Diagnosis present

## 2012-05-05 DIAGNOSIS — M069 Rheumatoid arthritis, unspecified: Secondary | ICD-10-CM | POA: Diagnosis present

## 2012-05-05 LAB — COMPREHENSIVE METABOLIC PANEL
AST: 27 U/L (ref 0–37)
Albumin: 3.2 g/dL — ABNORMAL LOW (ref 3.5–5.2)
BUN: 25 mg/dL — ABNORMAL HIGH (ref 6–23)
Chloride: 104 mEq/L (ref 96–112)
Creatinine, Ser: 1.12 mg/dL — ABNORMAL HIGH (ref 0.50–1.10)
Potassium: 4 mEq/L (ref 3.5–5.1)
Total Bilirubin: 1.1 mg/dL (ref 0.3–1.2)
Total Protein: 6.3 g/dL (ref 6.0–8.3)

## 2012-05-05 LAB — CBC WITH DIFFERENTIAL/PLATELET
Basophils Absolute: 0.1 10*3/uL (ref 0.0–0.1)
Basophils Relative: 2 % — ABNORMAL HIGH (ref 0–1)
Eosinophils Absolute: 0.3 10*3/uL (ref 0.0–0.7)
MCH: 27.5 pg (ref 26.0–34.0)
MCHC: 31.4 g/dL (ref 30.0–36.0)
Monocytes Absolute: 0.9 10*3/uL (ref 0.1–1.0)
Neutro Abs: 3.9 10*3/uL (ref 1.7–7.7)
Neutrophils Relative %: 56 % (ref 43–77)
RDW: 16.9 % — ABNORMAL HIGH (ref 11.5–15.5)

## 2012-05-05 LAB — PROTIME-INR
INR: 1.71 — ABNORMAL HIGH (ref 0.00–1.49)
Prothrombin Time: 19.5 seconds — ABNORMAL HIGH (ref 11.6–15.2)

## 2012-05-05 MED ORDER — SODIUM CHLORIDE 0.9 % IV SOLN
INTRAVENOUS | Status: DC
  Administered 2012-05-05: 09:00:00 via INTRAVENOUS

## 2012-05-05 MED ORDER — LOSARTAN POTASSIUM 50 MG PO TABS
50.0000 mg | ORAL_TABLET | Freq: Every day | ORAL | Status: DC
  Administered 2012-05-05 – 2012-05-08 (×4): 50 mg via ORAL
  Filled 2012-05-05 (×4): qty 1

## 2012-05-05 MED ORDER — ATORVASTATIN CALCIUM 10 MG PO TABS
10.0000 mg | ORAL_TABLET | Freq: Every day | ORAL | Status: DC
  Administered 2012-05-05 – 2012-05-08 (×4): 10 mg via ORAL
  Filled 2012-05-05 (×4): qty 1

## 2012-05-05 MED ORDER — SODIUM CHLORIDE 0.9 % IV SOLN
500.0000 mg | Freq: Once | INTRAVENOUS | Status: AC
  Administered 2012-05-05: 500 mg via INTRAVENOUS
  Filled 2012-05-05: qty 500

## 2012-05-05 MED ORDER — GABAPENTIN 300 MG PO CAPS
300.0000 mg | ORAL_CAPSULE | Freq: Every day | ORAL | Status: DC
  Administered 2012-05-05 – 2012-05-07 (×3): 300 mg via ORAL
  Filled 2012-05-05 (×4): qty 1

## 2012-05-05 MED ORDER — ACETAMINOPHEN 325 MG PO TABS
650.0000 mg | ORAL_TABLET | Freq: Four times a day (QID) | ORAL | Status: DC | PRN
  Administered 2012-05-05 – 2012-05-08 (×10): 650 mg via ORAL
  Filled 2012-05-05 (×10): qty 2

## 2012-05-05 MED ORDER — VANCOMYCIN HCL IN DEXTROSE 1-5 GM/200ML-% IV SOLN
1000.0000 mg | Freq: Once | INTRAVENOUS | Status: AC
  Administered 2012-05-05: 1000 mg via INTRAVENOUS
  Filled 2012-05-05: qty 200

## 2012-05-05 MED ORDER — FUROSEMIDE 40 MG PO TABS
40.0000 mg | ORAL_TABLET | Freq: Every day | ORAL | Status: DC
  Administered 2012-05-05 – 2012-05-08 (×4): 40 mg via ORAL
  Filled 2012-05-05 (×4): qty 1

## 2012-05-05 MED ORDER — VANCOMYCIN HCL 1000 MG IV SOLR
1500.0000 mg | INTRAVENOUS | Status: DC
  Administered 2012-05-06 – 2012-05-07 (×2): 1500 mg via INTRAVENOUS
  Filled 2012-05-05 (×2): qty 1500

## 2012-05-05 MED ORDER — IOHEXOL 300 MG/ML  SOLN
80.0000 mL | Freq: Once | INTRAMUSCULAR | Status: AC | PRN
  Administered 2012-05-05: 80 mL via INTRAVENOUS

## 2012-05-05 MED ORDER — ONDANSETRON HCL 4 MG/2ML IJ SOLN: 4.0000 mg | Freq: Four times a day (QID) | INTRAMUSCULAR | Status: DC | PRN

## 2012-05-05 MED ORDER — WARFARIN - PHYSICIAN DOSING INPATIENT
Freq: Every day | Status: DC
  Administered 2012-05-06: 18:00:00

## 2012-05-05 MED ORDER — METOPROLOL SUCCINATE ER 25 MG PO TB24
25.0000 mg | ORAL_TABLET | Freq: Every day | ORAL | Status: DC
  Administered 2012-05-05 – 2012-05-08 (×4): 25 mg via ORAL
  Filled 2012-05-05 (×4): qty 1

## 2012-05-05 MED ORDER — ENOXAPARIN SODIUM 40 MG/0.4ML ~~LOC~~ SOLN
40.0000 mg | Freq: Every day | SUBCUTANEOUS | Status: DC
  Administered 2012-05-05 – 2012-05-07 (×3): 40 mg via SUBCUTANEOUS
  Filled 2012-05-05 (×3): qty 0.4

## 2012-05-05 MED ORDER — SODIUM CHLORIDE 0.9 % IV SOLN
INTRAVENOUS | Status: DC
  Administered 2012-05-06: 03:00:00 via INTRAVENOUS

## 2012-05-05 MED ORDER — PREDNISONE 10 MG PO TABS
5.0000 mg | ORAL_TABLET | Freq: Every day | ORAL | Status: DC
  Administered 2012-05-05 – 2012-05-08 (×4): 5 mg via ORAL
  Filled 2012-05-05 (×4): qty 1

## 2012-05-05 MED ORDER — FOLIC ACID 1 MG PO TABS
1.0000 mg | ORAL_TABLET | Freq: Every day | ORAL | Status: DC
  Administered 2012-05-05 – 2012-05-08 (×4): 1 mg via ORAL
  Filled 2012-05-05 (×4): qty 1

## 2012-05-05 MED ORDER — WARFARIN SODIUM 5 MG PO TABS
5.0000 mg | ORAL_TABLET | Freq: Every day | ORAL | Status: DC
  Administered 2012-05-05 – 2012-05-07 (×3): 5 mg via ORAL
  Filled 2012-05-05 (×3): qty 1

## 2012-05-05 NOTE — H&P (Signed)
Marissa [name], Marissa [name]                ACCOUNT NO.:  [id number]  MEDICAL RECORD NO.:  [id number]  LOCATION:  A302                          FACILITY:  [facility]  PHYSICIAN:  Esker Dever G. Renard Matter, MD   DATE OF BIRTH:  May 02, 1933  DATE OF ADMISSION:  05/05/2012 DATE OF DISCHARGE:  LH                             HISTORY & PHYSICAL   This 78 year old female presented to the emergency room with discomfort over the left side of head involving the periorbital area and scalp. This was felt to be cellulitis.  She was given intravenous antibiotics vancomycin in emergency department and this was cultured.  She was subsequently admitted.  CT of the head without contrast was completed showed evidence of soft tissue swelling without postseptal extension involving highly it's more inferior and extension inflammatory process over the left cheek.  Cholesteatoma were not excluded.  Carotid vein atherosclerotic type changes noted.  The cellulitis started as a simple palpable on the left side of scalp thought to be a small abscess.  SOCIAL HISTORY:  The patient does not smoke.  Uses alcohol on occasions.  PAST MEDICAL HISTORY:  History of atrial fibrillation, hypertension, nonischemic cardiomyopathy, chronic CHF with 35% ejection fraction, previous history of neoplasm.  PAST SURGICAL HISTORY:  Appendectomy, abdominal hysterectomy, hernia repair September 26, 2008, and April 17, 2011.  No family history available.  ALLERGIES:  CODEINE and PENICILLIN.  REVIEW OF SYSTEMS:  HEENT:  Essentially negative with exception of discomfort around the left eye and left ear.  CARDIOPULMONARY:  No cough, hemoptysis, or dyspnea.  GI:  No bowel irregularity, nausea, vomiting or diarrhea.  GU:  No dysuria, hematuria.  MEDICATION LIST: 1. Acetaminophen 500 to 1000 mg every 6 hours as needed for pain. 2. Alendronate sodium 70 mg weekly. 3. Atorvastatin 10 mg daily. 4. Cephalexin 500 mg t.i.d. 5. Vitamin D 2000 units daily. 6.  Digoxin 0.125 mg daily. 7. Folic acid 1 mg daily. 8. Furosemide 40 mg daily. 9. Gabapentin 300 mg at bedtime. 10.Hydrocodone and acetaminophen 5/500 p.r.n. for pain. 11.GenTeal ophthalmic 1 drop into both eyes daily. 12.Losartan potassium 50 mg daily. 13.Metoprolol succinate 50 mg half tablet daily. 14.Prednisone 5 mg daily. 15.Warfarin 5 mg daily.  PHYSICAL EXAMINATION:  GENERAL:  The patient is alert and appears fairly comfortable.  OBJECTIVE:  VITAL SIGNS:  Blood pressure 147/85, respirations 18, pulse 97, temperature 98. HEENT:  Eyes, PERRLA.  There is evidence of inflammation around the left side.  Pustular area on the leftward, some inflammation in the left external ear. NECK:  Supple.  No JVD or thyroid abnormalities. HEART:  Regular rhythm.  No murmurs. LUNGS:  Clear to P and A. ABDOMEN:  No palpable organs or masses. SKIN:  Warm and dry. NEUROLOGIC:  Cranial nerves intact.  No motor or sensory abnormalities.  ASSESSMENT:  The patient is felt to have cellulitis of the left forehead, left ear and left scalp.  PLAN:  To start and continue IV vancomycin, warm compresses several times a day.  Continue previous medications.     Adesuwa Osgood G. Renard Matter, MD     AGM/MEDQ  D:  05/05/2012  T:  05/05/2012  Job:  [id number]

## 2012-05-05 NOTE — ED Notes (Signed)
Pt c/o small "pimple" like bump to top of head that began Thursday night. Area now red and swollen with black center. Redness extending down to left side of head to ear. Left eye red and swollen .denies any difficulty with swallowing or breathing. Does c/o rash to neck.

## 2012-05-05 NOTE — Progress Notes (Signed)
ANTIBIOTIC CONSULT NOTE - INITIAL  Pharmacy Consult for Vancomycin Indication: cellulitis   Allergies  Allergen Reactions  . Codeine Other (See Comments)    Makes patient sleepy.  Marland Kitchen Penicillins Rash    Patient Measurements: Height: 5\' 5"  (165.1 cm) Weight: 182 lb 15.7 oz (83 kg) IBW/kg (Calculated) : 57    Vital Signs: Temp: 98 F (36.7 C) (10/27 1257) Temp src: Oral (10/27 1257) BP: 147/85 mmHg (10/27 1257) Pulse Rate: 97  (10/27 1257) Intake/Output from previous day:   Intake/Output from this shift:    Labs:  Basename 05/05/12 0945  WBC 7.0  HGB 13.7  PLT 172  LABCREA --  CREATININE 1.12*   Estimated Creatinine Clearance: 45.5 ml/min (by C-G formula based on Cr of 1.12). No results found for this basename: VANCOTROUGH:2,VANCOPEAK:2,VANCORANDOM:2,GENTTROUGH:2,GENTPEAK:2,GENTRANDOM:2,TOBRATROUGH:2,TOBRAPEAK:2,TOBRARND:2,AMIKACINPEAK:2,AMIKACINTROU:2,AMIKACIN:2, in the last 72 hours   Microbiology: Recent Results (from the past 720 hour(s))  CULTURE, BLOOD (ROUTINE X 2)     Status: Normal (Preliminary result)   Collection Time   05/05/12  9:20 AM      Component Value Range Status Comment   Specimen Description BLOOD LEFT ARM   Final    Special Requests [facility]   Final    Culture PENDING   Incomplete    Report Status PENDING   Incomplete   CULTURE, BLOOD (ROUTINE X 2)     Status: Normal (Preliminary result)   Collection Time   05/05/12  9:40 AM      Component Value Range Status Comment   Specimen Description BLOOD LEFT HAND   Final    Special Requests [facility]   Final    Culture PENDING   Incomplete    Report Status PENDING   Incomplete     Medical History: Past Medical History  Diagnosis Date  . Edema   . Atrial fibrillation     A.  Chronic Coumadin  . Neoplasm     unspecifide nature digestive system  . MVP (mitral valve prolapse)   . Hip fracture     left  . Hypertension   . Nonischemic cardiomyopathy     A.  07/23/11 - Echo: EF 35%;  B.  07/24/2011 - Cath: nonobs dzs ef 35%, 2+MR   . Systolic CHF, chronic     A. Diag 07/2011, EF 35%  . Falls frequently     Medications:  Scheduled:    . atorvastatin  10 mg Oral Daily  . enoxaparin  40 mg Subcutaneous Daily  . folic acid  1 mg Oral Daily  . furosemide  40 mg Oral Daily  . gabapentin  300 mg Oral QHS  . losartan  50 mg Oral Daily  . metoprolol succinate  25 mg Oral Daily  . predniSONE  5 mg Oral Daily  . vancomycin  1,000 mg Intravenous Once  . warfarin  5 mg Oral q1800  . Warfarin - Physician Dosing Inpatient   Does not apply q1800   Assessment: Okay for Protocol Received 1000mg  in ER this morning.  Goal of Therapy:  Vancomycin trough level 10-15 mcg/ml  Plan:  Additional 500mg  IV x now, then Vancomycin 1500mg  IV every 24 hours. Measure antibiotic drug levels at steady state Follow up culture results  Lamonte Richer R 05/05/2012,2:17 PM

## 2012-05-05 NOTE — ED Provider Notes (Signed)
History     CSN: [id number]  Arrival date & time 05/05/12  4098   First MD Initiated Contact with Patient 05/05/12 [phone]      Chief Complaint  Patient presents with  . Abscess    (Consider location/radiation/quality/duration/timing/severity/associated sxs/prior treatment) HPI Comments: States "this all started with a pimple" in the L scalp.  Now L ear pain, L orbital swelling an chin redness and swelling.  She denies fever or chills.  No diff breathing or swallowing.  Took benadryl alast PM before bedtime.  Patient is a 78 y.o. female presenting with abscess. The history is provided by the patient. No language interpreter was used.  Abscess  This is a new problem. Episode frequency: 3 days ago. The abscess is present on the scalp, left ear and left eye (chin). The problem is moderate. The abscess is characterized by painfulness. The abscess first occurred at home. Pertinent negatives include no fever, no diarrhea and no vomiting.    Past Medical History  Diagnosis Date  . Edema   . Atrial fibrillation     A.  Chronic Coumadin  . Neoplasm     unspecifide nature digestive system  . MVP (mitral valve prolapse)   . Hip fracture     left  . Hypertension   . Nonischemic cardiomyopathy     A.  07/23/11 - Echo: EF 35%;  B. 07/24/2011 - Cath: nonobs dzs ef 35%, 2+MR   . Systolic CHF, chronic     A. Diag 07/2011, EF 35%  . Falls frequently     Past Surgical History  Procedure Date  . Appendectomy   . Abdominal hysterectomy   . Hernia repair 09/2008  . Hernia repair 04/17/11    lap ventral incisional hernia repair with mesh     No family history on file.  History  Substance Use Topics  . Smoking status: Never Smoker   . Smokeless tobacco: Never Used  . Alcohol Use: Yes     rare    OB History    Grav Para Term Preterm Abortions TAB SAB Ect Mult Living                  Review of Systems  Constitutional: Negative for fever and chills.  HENT: Positive for ear pain.  Negative for trouble swallowing and ear discharge.   Respiratory: Negative for shortness of breath, wheezing and stridor.   Cardiovascular: Negative for leg swelling.  Gastrointestinal: Negative for vomiting and diarrhea.  Neurological: Negative for syncope and light-headedness.  Hematological: Bruises/bleeds easily.  Psychiatric/Behavioral: Negative for confusion.  All other systems reviewed and are negative.    Allergies  Codeine and Penicillins  Home Medications   Current Outpatient Rx  Name Route Sig Dispense Refill  . ACETAMINOPHEN 500 MG PO TABS Oral Take 500-1,000 mg by mouth every 6 (six) hours as needed. Pain    . ALENDRONATE SODIUM 70 MG PO TABS Oral Take 70 mg by mouth every 7 (seven) days. Take with a full glass of water on an empty stomach. Takes on Saturday    . ATORVASTATIN CALCIUM 10 MG PO TABS Oral Take 10 mg by mouth daily.    . CEPHALEXIN 500 MG PO CAPS Oral Take 500 mg by mouth Three times a day.    Marland Kitchen VITAMIN D 2000 UNITS PO CAPS Oral Take 2,000 Units by mouth daily.    Marland Kitchen DIGOXIN 0.125 MG PO TABS Oral Take 0.125 mg by mouth daily.    Marland Kitchen FOLIC  ACID 1 MG PO TABS Oral Take 1 mg by mouth daily.    . FUROSEMIDE 40 MG PO TABS Oral Take 1 tablet (40 mg total) by mouth daily. 30 tablet 11  . GABAPENTIN 300 MG PO CAPS Oral Take 300 mg by mouth at bedtime.    Marland Kitchen HYDROCODONE-ACETAMINOPHEN 5-500 MG PO TABS Oral Take 1 tablet by mouth daily.     Marland Kitchen GENTEAL OP Both Eyes Place 1 drop into both eyes daily as needed. Tired Eyes    . LOSARTAN POTASSIUM 50 MG PO TABS Oral Take 50 mg by mouth daily.    Marland Kitchen METOPROLOL SUCCINATE ER 50 MG PO TB24 Oral Take 25 mg by mouth daily. Take with or immediately following a meal.    . PREDNISONE 5 MG PO TABS Oral Take 5 mg by mouth daily.    . WARFARIN SODIUM 5 MG PO TABS Oral Take 5 mg by mouth daily. Takes 5 mg all days besides on Mon and Thurs when patient takes 2.5 mg.      BP 140/81  Pulse 63  Temp 98.7 F (37.1 C) (Oral)  Resp 18  SpO2  94%  Physical Exam  Nursing note and vitals reviewed. Constitutional: She is oriented to person, place, and time. She appears well-developed and well-nourished. She is cooperative.  Non-toxic appearance. She does not have a sickly appearance. She does not appear ill. No distress.  HENT:  Head: Normocephalic and atraumatic.    Right Ear: Hearing, tympanic membrane, external ear and ear canal normal.  Left Ear: Hearing and external ear normal. No drainage or swelling. Tympanic membrane is injected.  Ears:  Eyes: Conjunctivae normal and EOM are normal. Pupils are equal, round, and reactive to light. Left eye exhibits no discharge, no exudate and no hordeolum. No scleral icterus.  Neck: Normal range of motion.  Cardiovascular: Normal rate, regular rhythm and normal heart sounds.   Pulmonary/Chest: Effort normal and breath sounds normal. No accessory muscle usage. Not tachypneic. No respiratory distress. She has no decreased breath sounds. She has no wheezes. She has no rhonchi. She has no rales.  Abdominal: Soft. She exhibits no distension. There is no tenderness.  Musculoskeletal: Normal range of motion.  Lymphadenopathy:    She has no cervical adenopathy.  Neurological: She is alert and oriented to person, place, and time. She has normal strength. She displays a negative Romberg sign. GCS eye subscore is 4. GCS verbal subscore is 5. GCS motor subscore is 6.  Skin: Skin is warm and dry.  Psychiatric: She has a normal mood and affect. Judgment normal.    ED Course  Procedures (including critical care time)  Labs Reviewed  CBC WITH DIFFERENTIAL - Abnormal; Notable for the following:    RDW 16.9 (*)     Basophils Relative 2 (*)     All other components within normal limits  PROTIME-INR - Abnormal; Notable for the following:    Prothrombin Time 19.5 (*)     INR 1.71 (*)     All other components within normal limits  COMPREHENSIVE METABOLIC PANEL  CULTURE, BLOOD (ROUTINE X 2)    CULTURE, BLOOD (ROUTINE X 2)   No results found.   No diagnosis found.    MDM  D/w dr. Renard Matter who will see pt on the floor.        Evalina Field, [place] 05/05/12 1151

## 2012-05-05 NOTE — ED Provider Notes (Signed)
Medical screening examination/treatment/procedure(s) were conducted as a shared visit with non-physician practitioner(s) and myself.  I personally evaluated the patient during the encounter Jones Skene, M.D.  Marissa [name] is a 78 y.o. female presenting with cellulitis the left side of the scalp and swelling of the preorbital tissues on the left side. These areas of swelling are not post septal as seen on CT and are likely related to the scalp cellulitis. Given the patient IV antibiotics after cultures obtained-admit patient for observation.  VITAL SIGNS:   Filed Vitals:   05/05/12 0851  BP: 140/81  Pulse: 63  Temp: 98.7 F (37.1 C)  Resp: 18   Ct Head Wo Contrast  05/05/2012  *RADIOLOGY REPORT*  Clinical Data: Left side orbital swelling for past few days.  No injury or headache.  CT HEAD WITHOUT CONTRAST  Technique:  Contiguous axial images were obtained from the base of the skull through the vertex without contrast.  Comparison: 07/21/2011.  Findings: New from the prior examination is mild preseptal soft tissue swelling left lateral aspect of the orbit and overlying the lateral aspect of the lateral wall of the orbit.  No postseptal extension.  Etiology indeterminate.  This may represent infectious or inflammatory process.  The patient denies trauma to suggest result of trauma.  Tumor felt unlikely.  Clinical correlation recommended.  No intracranial hemorrhage.  No CT evidence of large acute infarct.  Global atrophy without hydrocephalus.  Small vessel disease type changes.  Opacification left middle ear (inferiorly).  Cholesteatoma not excluded although no obvious bony destruction and therefore this may represent simple inflammatory changes.  IMPRESSION: New from the prior examination is mild preseptal soft tissue swelling left lateral aspect of the orbit and overlying the lateral aspect of the lateral wall of the orbit.  No postseptal extension. Etiology indeterminate.  This may represent  infectious or inflammatory process.  The patient denies trauma to suggest result of trauma.  Tumor felt unlikely.  Clinical correlation recommended.  No intracranial hemorrhage.  No CT evidence of large acute infarct.  Global atrophy without hydrocephalus.  Small vessel disease type changes.  Opacification left middle ear (inferiorly).  Cholesteatoma not excluded although no obvious bony destruction and therefore this may represent simple inflammatory changes   Original Report Authenticated By: Fuller Canada, M.D.    Ct Maxillofacial W/cm  05/05/2012  *RADIOLOGY REPORT*  Clinical Data: Left orbital swelling.  No trauma.  CT MAXILLOFACIAL WITH CONTRAST  Technique:  Multidetector CT imaging of the maxillofacial structures was performed with intravenous contrast. Multiplanar CT image reconstructions were also generated.  Contrast: 80mL OMNIPAQUE IOHEXOL 300 MG/ML  SOLN  Comparison: C t head performed the same date.  Findings: Left preseptal soft tissue swelling without postseptal extension.  This involves the eyelids  more notable inferiorly with extension of inflammatory process over the left cheek.  No thrombosis of the angular vein or asymmetry of the cavernous sinuses suggest intracranial extension.  Superior ophthalmic vein is symmetric.  No intracranial enhancing lesion.  Opacification left middle ear/hypotympanum surrounding the stapes and lenticular process of the pancreas.  This may represent inflammatory tissue although cholesteatoma not excluded.  Carotid bifurcation calcifications with narrowing incompletely assessed on present exam. Cavernous segment internal carotid artery atherosclerotic type changes.  Tiny right lobe the thyroid lesion.  Cervical spondylotic changes.  IMPRESSION: Left preseptal soft tissue swelling without postseptal extension. This involves the eyelids  more notable inferiorly with extension of inflammatory process over the left cheek.  Opacification left middle  ear/hypotympanum  surrounding the stapes and lenticular process of the pancreas.  This may represent inflammatory changes although cholesteatoma not excluded.  Carotid vein atherosclerotic type changes.   Original Report Authenticated By: Fuller Canada, M.D.       Jones Skene, MD 05/05/12 1242

## 2012-05-06 LAB — PROTIME-INR: INR: 1.95 — ABNORMAL HIGH (ref 0.00–1.49)

## 2012-05-06 MED ORDER — SODIUM CHLORIDE 0.9 % IJ SOLN
INTRAMUSCULAR | Status: AC
  Administered 2012-05-06: 3 mL
  Filled 2012-05-06: qty 3

## 2012-05-06 MED ORDER — SODIUM CHLORIDE 0.9 % IJ SOLN
INTRAMUSCULAR | Status: AC
  Administered 2012-05-06: 10 mL
  Filled 2012-05-06: qty 3

## 2012-05-06 NOTE — Progress Notes (Signed)
NAMEAMERE, IOTT                ACCOUNT NO.:  [id number]  MEDICAL RECORD NO.:  [id number]  LOCATION:  A302                          FACILITY:  [facility]  PHYSICIAN:  Kingsley Callander. [name], MD       DATE OF BIRTH:  1932/10/30  DATE OF PROCEDURE:  05/06/2012 DATE OF DISCHARGE:                                PROGRESS NOTE   Ms. Andujo was admitted yesterday with cellulitis involving the left side of her face.  She has been treated with vancomycin.  Her white count was 7.0 yesterday.  She had a CT scan which was consistent with cellulitis.  She denies any fever.  She notes that her pain and swelling are significantly improved.  She states that this started with a small pimple on her left parietal area.  She has not had clusters of vesicles.  PHYSICAL EXAMINATION:  VITAL SIGNS:  Temperature 97.4, pulse 69, respirations 16, blood pressure 145/91. LUNGS:  Clear. HEART:  Irregularly irregular. ABDOMEN:  Soft and nontender. HEENT AND NECK:  Her head and neck reveals left periorbital swelling with erythema, swelling, and a scabbed area on her left parietal area. She also has a small area of erythema in the submental region and on the left lower lip.  IMPRESSION/PLAN: 1. Cellulitis.  Continue vancomycin. 2. Chronic atrial fibrillation.  Her INR is 1.95 today.  Continue     current Coumadin dose. 3. Hypertension.  Continue current treatment. 4. Possible cholesteatoma of the left ear by CT.     Kingsley Callander. [name], MD     ROF/MEDQ  D:  05/06/2012  T:  05/06/2012  Job:  [id number]

## 2012-05-06 NOTE — Progress Notes (Signed)
UR chart review completed.  

## 2012-05-06 NOTE — Care Management Note (Signed)
    Page 1 of 1   05/08/2012     2:46:32 PM   CARE MANAGEMENT NOTE 05/08/2012  Patient:  Marissa [name], Marissa [name]   Account Number:  [id number]  Date Initiated:  05/06/2012  Documentation initiated by:  Sharrie Rothman  Subjective/Objective Assessment:   Pt admitted from home with cellulitis. Pt lives alone but has a daughter and a neigbor that is very active in the care of the pt. Pt will return home at discharge. pt has a quad cane for home use.     Action/Plan:   No CM or HH needs noted. Will continue to follow for any needs for IV AB at home.   Anticipated DC Date:  05/08/2012   Anticipated DC Plan:  HOME/SELF CARE      DC Planning Services  CM consult      Choice offered to / List presented to:             Status of service:  Completed, signed off Medicare Important Message given?  YES (If response is "NO", the following Medicare IM given date fields will be blank) Date Medicare IM given:  05/08/2012 Date Additional Medicare IM given:    Discharge Disposition:  HOME/SELF CARE  Per UR Regulation:    If discussed at Long Length of Stay Meetings, dates discussed:    Comments:  05/06/12 1120 Arlyss Queen, RN BSN CM

## 2012-05-07 LAB — PROTIME-INR: Prothrombin Time: 22.8 seconds — ABNORMAL HIGH (ref 11.6–15.2)

## 2012-05-07 MED ORDER — SODIUM CHLORIDE 0.9 % IJ SOLN
INTRAMUSCULAR | Status: AC
  Administered 2012-05-07: 3 mL
  Filled 2012-05-07: qty 3

## 2012-05-07 MED ORDER — METOPROLOL TARTRATE 25 MG PO TABS
25.0000 mg | ORAL_TABLET | Freq: Once | ORAL | Status: AC
  Administered 2012-05-07: 25 mg via ORAL
  Filled 2012-05-07: qty 1

## 2012-05-07 MED ORDER — CLONIDINE HCL 0.1 MG PO TABS
0.1000 mg | ORAL_TABLET | ORAL | Status: DC | PRN
Start: 2012-05-07 — End: 2012-05-08
  Administered 2012-05-07: 0.1 mg via ORAL
  Filled 2012-05-07: qty 1

## 2012-05-07 NOTE — Progress Notes (Signed)
Dr. [name] returned page. New order received to give patient one time dose of Metoprolol 25 mg PO now.  Orders placed in chart. Will continue to monitor.

## 2012-05-07 NOTE — Progress Notes (Signed)
Paged Dr. [name] regarding pt's elevated BP. BP at this time 176/93. Pt asymptomatic. Resting in bed. Awaiting return page at this time. Marissa [name], Joyice Faster

## 2012-05-07 NOTE — Progress Notes (Signed)
NAMEANGELEAN, COXWELL                ACCOUNT NO.:  [id number]  MEDICAL RECORD NO.:  [id number]  LOCATION:  A302                          FACILITY:  [facility]  PHYSICIAN:  Kingsley Callander. [name], MD       DATE OF BIRTH:  [birth year]  DATE OF PROCEDURE:  05/07/2012 DATE OF DISCHARGE:                                PROGRESS NOTE   Ms. Amyot is feeling better.  She has had no fever.  She is eating without difficulty.  PHYSICAL EXAMINATION:  VITAL SIGNS:  Temperature is 97.5 with a pulse of 89 and blood pressure of 130/83. HEENT:  The left eye is less swollen today.  She has erythema in the left parietal region and several small pustule type lesions.  She has some mild erythema on the upper chest as well.  The swelling of the left lip and the left submental area appears to be decreased.  They do not appear to be any vesicle type lesions to suggest a herpes zoster-type eruption. LUNGS:  Clear. HEART:  Irregular.  IMPRESSION/PLAN: 1. Cellulitis.  Continue vancomycin. 2. Chronic atrial fibrillation.  INR is 2.1.  Continue Coumadin. 3. Possible cholesteatoma.  Planned ENT consult after discharge.     Kingsley Callander. [name], MD     ROF/MEDQ  D:  05/07/2012  T:  05/07/2012  Job:  [id number]

## 2012-05-08 LAB — PROTIME-INR
INR: 2.44 — ABNORMAL HIGH (ref 0.00–1.49)
Prothrombin Time: 25.4 seconds — ABNORMAL HIGH (ref 11.6–15.2)

## 2012-05-08 MED ORDER — CEPHALEXIN 500 MG PO CAPS: 500.0000 mg | ORAL_CAPSULE | Freq: Three times a day (TID) | ORAL | Status: DC

## 2012-05-08 MED ORDER — DOXYCYCLINE HYCLATE 100 MG PO TABS: 100.0000 mg | ORAL_TABLET | Freq: Two times a day (BID) | ORAL | Status: DC

## 2012-05-08 NOTE — Progress Notes (Signed)
05/08/12 1102 Patient discharged home, reviewed discharge instructions with patient and her daughter at her request. Given copy of instructions, med list, prescriptions, f/u appointment information. Stated preferred to call and schedule f/u appointment herself. Reviewed medication list with patient, notified of which medications she has had this morning prior to discharge, reviewed wound care and when to call MD via teachback method. Pt and daughter verbalized understanding. Pt left floor in stable condition via w/c accompanied by nurse tech. Earnstine Regal, RN

## 2012-05-08 NOTE — Discharge Summary (Signed)
Marissa [name], Marissa [name]                ACCOUNT NO.:  [id number]  MEDICAL RECORD NO.:  [id number]  LOCATION:  A302                          FACILITY:  [facility]  PHYSICIAN:  Kingsley Callander. [name], MD       DATE OF BIRTH:  1933-06-05  DATE OF ADMISSION:  05/05/2012 DATE OF DISCHARGE:  10/30/2013LH                              DISCHARGE SUMMARY   DISCHARGE DIAGNOSES: 1. Cellulitis. 2. Chronic atrial fibrillation. 3. Hypertension. 4. Rheumatoid arthritis. 5. Congestive heart failure. 6. Chronic systolic congestive heart failure.  DISCHARGE MEDICATIONS: 1. Doxycycline 100 mg b.i.d. for 7 days. 2. Cephalexin 500 mg t.i.d. for 7 days. 3. Fosamax 70 mg weekly. 4. Lipitor 10 mg daily. 5. Vitamin D 2000 units daily. 6. Digoxin 0.125 mg daily. 7. Folic acid 1 mg daily. 8. Lasix 40 mg daily. 9. Gabapentin 300 mg at bedtime. 10.Vicodin 1 tablet daily. 11.GenTeal 1 drop in both eyes as needed. 12.Losartan 50 mg daily. 13.Toprol-XL 25 mg daily. 14.Prednisone 5 mg daily. 15.Coumadin 5 mg daily except 2.5 mg on Monday and Thursday.  HOSPITAL COURSE:  This patient is a 78 year old female with a history of rheumatoid arthritis, CHF and chronic AFib, who was admitted with cellulitis on the left side of her head.  She underwent a CT scan initially which revealed soft tissue swelling in the left periorbital area.  There was also on opacification in the left middle ear inferiorly.  Cholesteatoma could not be excluded, but no bony destruction was noted.  She was treated with IV vancomycin.  Her white count was 7000.  Her blood cultures were negative.  There was a significant improvement with IV antibiotic therapy.  She developed no fever.  The redness and swelling decreased significantly. She was felt to be improved and stable for conversion to oral antibiotic therapy on the 30th.  She will be seen in followup in my office in 1 week.  She was anticoagulated with Coumadin and had an INR of 2.4 on  discharge.  She was stable from a cardiovascular standpoint.  Her condition at discharge is much improved.  She will be treated with cephalexin and doxycycline for additional 7 days.     Kingsley Callander. [name], MD     ROF/MEDQ  D:  05/08/2012  T:  05/08/2012  Job:  [id number]

## 2012-05-10 LAB — CULTURE, BLOOD (ROUTINE X 2)
Culture: NO GROWTH
Culture: NO GROWTH

## 2012-05-16 ENCOUNTER — Ambulatory Visit (INDEPENDENT_AMBULATORY_CARE_PROVIDER_SITE_OTHER): Payer: Medicare Other | Admitting: Otolaryngology

## 2012-05-16 DIAGNOSIS — H903 Sensorineural hearing loss, bilateral: Secondary | ICD-10-CM

## 2012-05-16 DIAGNOSIS — H6980 Other specified disorders of Eustachian tube, unspecified ear: Secondary | ICD-10-CM

## 2012-05-16 DIAGNOSIS — H6120 Impacted cerumen, unspecified ear: Secondary | ICD-10-CM

## 2012-05-22 ENCOUNTER — Ambulatory Visit (INDEPENDENT_AMBULATORY_CARE_PROVIDER_SITE_OTHER): Payer: Medicare Other | Admitting: *Deleted

## 2012-05-22 DIAGNOSIS — I4891 Unspecified atrial fibrillation: Secondary | ICD-10-CM

## 2012-05-22 DIAGNOSIS — Z7901 Long term (current) use of anticoagulants: Secondary | ICD-10-CM

## 2012-05-22 LAB — POCT INR: INR: 3

## 2012-06-13 ENCOUNTER — Ambulatory Visit (INDEPENDENT_AMBULATORY_CARE_PROVIDER_SITE_OTHER): Payer: Medicare Other | Admitting: Otolaryngology

## 2012-06-20 ENCOUNTER — Ambulatory Visit (INDEPENDENT_AMBULATORY_CARE_PROVIDER_SITE_OTHER): Payer: Medicare Other | Admitting: Otolaryngology

## 2012-06-20 DIAGNOSIS — H6980 Other specified disorders of Eustachian tube, unspecified ear: Secondary | ICD-10-CM

## 2012-06-26 ENCOUNTER — Ambulatory Visit (INDEPENDENT_AMBULATORY_CARE_PROVIDER_SITE_OTHER): Payer: Medicare Other | Admitting: *Deleted

## 2012-06-26 DIAGNOSIS — Z7901 Long term (current) use of anticoagulants: Secondary | ICD-10-CM

## 2012-06-26 DIAGNOSIS — I4891 Unspecified atrial fibrillation: Secondary | ICD-10-CM

## 2012-07-10 DIAGNOSIS — S32000A Wedge compression fracture of unspecified lumbar vertebra, initial encounter for closed fracture: Secondary | ICD-10-CM

## 2012-07-10 HISTORY — DX: Wedge compression fracture of unspecified lumbar vertebra, initial encounter for closed fracture: S32.000A

## 2012-07-29 ENCOUNTER — Encounter: Payer: Self-pay | Admitting: Cardiology

## 2012-07-29 ENCOUNTER — Ambulatory Visit (INDEPENDENT_AMBULATORY_CARE_PROVIDER_SITE_OTHER): Payer: Medicare Other | Admitting: Cardiology

## 2012-07-29 ENCOUNTER — Ambulatory Visit (INDEPENDENT_AMBULATORY_CARE_PROVIDER_SITE_OTHER): Payer: Medicare Other | Admitting: *Deleted

## 2012-07-29 VITALS — BP 139/80 | HR 79 | Ht 65.0 in | Wt 186.1 lb

## 2012-07-29 DIAGNOSIS — Z7901 Long term (current) use of anticoagulants: Secondary | ICD-10-CM

## 2012-07-29 DIAGNOSIS — I5021 Acute systolic (congestive) heart failure: Secondary | ICD-10-CM

## 2012-07-29 DIAGNOSIS — I4891 Unspecified atrial fibrillation: Secondary | ICD-10-CM

## 2012-07-29 DIAGNOSIS — Z9181 History of falling: Secondary | ICD-10-CM

## 2012-07-29 DIAGNOSIS — R609 Edema, unspecified: Secondary | ICD-10-CM

## 2012-07-29 DIAGNOSIS — I428 Other cardiomyopathies: Secondary | ICD-10-CM

## 2012-07-29 DIAGNOSIS — I5022 Chronic systolic (congestive) heart failure: Secondary | ICD-10-CM

## 2012-07-29 DIAGNOSIS — I729 Aneurysm of unspecified site: Secondary | ICD-10-CM

## 2012-07-29 DIAGNOSIS — I509 Heart failure, unspecified: Secondary | ICD-10-CM

## 2012-07-29 DIAGNOSIS — R296 Repeated falls: Secondary | ICD-10-CM

## 2012-07-29 MED ORDER — SPIRONOLACTONE 25 MG PO TABS: 25.0000 mg | ORAL_TABLET | Freq: Every day | ORAL | Status: DC

## 2012-07-29 NOTE — Patient Instructions (Addendum)
Your physician recommends that you schedule a follow-up appointment in: 6 MONTHS WITH TW  Your physician has recommended you make the following change in your medication:   1) START SPIROLACTONE 25MG  EVERY DAY  Your physician recommends that you return for lab work in: 10 DAYS (BMET) SLIPS GIVEN

## 2012-07-29 NOTE — Progress Notes (Signed)
HPI [name] returns today for evaluation and management of her chronic A. fib, chronic anticoagulation, chronic systolic heart failure, and nonischemic cardiomyopathy.  Falls are listed in her chart but she has not fallen in quite some time. She's been very compliant and INR . was therapeutic today. She denies any melena or bleeding.  Her biggest complaint is getting breathless with tried to walk. She uses a walker. She denies any angina. She does have chronic edema which may be a little worse. She is on Lasix 40 mg a day. Other meds reviewed.  She denies orthopnea PND. She has chronic palpitations.  Past Medical History  Diagnosis Date  . Edema   . Atrial fibrillation     A.  Chronic Coumadin  . Neoplasm     unspecifide nature digestive system  . MVP (mitral valve prolapse)   . Hip fracture     left  . Hypertension   . Nonischemic cardiomyopathy     A.  07/23/11 - Echo: EF 35%;  B. 07/24/2011 - Cath: nonobs dzs ef 35%, 2+MR   . Systolic CHF, chronic     A. Diag 07/2011, EF 35%  . Falls frequently     Current Outpatient Prescriptions  Medication Sig Dispense Refill  . alendronate (FOSAMAX) 70 MG tablet Take 70 mg by mouth every 7 (seven) days. Take with a full glass of water on an empty stomach. Takes on Saturday      . atorvastatin (LIPITOR) 10 MG tablet Take 10 mg by mouth daily.      . Cholecalciferol (VITAMIN D) 2000 UNITS CAPS Take 2,000 Units by mouth daily.      . digoxin (LANOXIN) 0.125 MG tablet Take 0.125 mg by mouth daily.      . folic acid (FOLVITE) 1 MG tablet Take 1 mg by mouth daily.      . furosemide (LASIX) 40 MG tablet Take 1 tablet (40 mg total) by mouth daily.  30 tablet  11  . gabapentin (NEURONTIN) 300 MG capsule Take 300 mg by mouth at bedtime.      Marland Kitchen HYDROcodone-acetaminophen (VICODIN) 5-500 MG per tablet Take 1 tablet by mouth daily.       . Hypromellose (GENTEAL OP) Place 1 drop into both eyes daily as needed. Tired Eyes      . losartan (COZAAR) 50 MG  tablet Take 50 mg by mouth daily.      . metoprolol succinate (TOPROL-XL) 50 MG 24 hr tablet Take 25 mg by mouth daily. Take with or immediately following a meal.      . predniSONE (DELTASONE) 5 MG tablet Take 5 mg by mouth daily.      Marland Kitchen warfarin (COUMADIN) 5 MG tablet Take 2.5-5 mg by mouth daily. 2.5mg  on Mondays and Thursdays.  5mg  on all other days of the week.        Allergies  Allergen Reactions  . Codeine Other (See Comments)    Makes patient sleepy.  Marland Kitchen Penicillins Rash    No family history on file.  History   Social History  . Marital Status: Widowed    Spouse Name: N/A    Number of Children: N/A  . Years of Education: N/A   Occupational History  . retired    Social History Main Topics  . Smoking status: Never Smoker   . Smokeless tobacco: Never Used  . Alcohol Use: Yes     Comment: rare  . Drug Use: No  . Sexually Active: Not on  file   Other Topics Concern  . Not on file   Social History Narrative  . No narrative on file    ROS ALL NEGATIVE EXCEPT THOSE NOTED IN HPI  PE  General Appearance: well developed, well nourished in no acute distress, chronically ill HEENT: symmetrical face, PERRLA, good dentition  Neck: no JVD, thyromegaly, or adenopathy, trachea midline Chest: symmetric without deformity Cardiac: PMI non-displaced, irregular rate and rhythm, normal S1, S2, no gallop or murmur Lung: clear to ausculation and percussion Vascular: all pulses full without bruits  Abdominal: nondistended, nontender, good bowel sounds, no HSM, no bruits Extremities: no cyanosis, clubbing , 2+ pitting edema, no sign of DVT, superficial varicosities  Skin: normal color, no rashes Neuro: alert and oriented x 3, non-focal Pysch: normal affect  EKG Chronic atrial fibrillation, well-controlled rate BMET    Component Value Date/Time   NA 143 05/05/2012 0945   K 4.0 05/05/2012 0945   CL 104 05/05/2012 0945   CO2 30 05/05/2012 0945   GLUCOSE 87 05/05/2012 0945    BUN 25* 05/05/2012 0945   CREATININE 1.12* 05/05/2012 0945   CREATININE 1.04 11/09/2010 1227   CALCIUM 9.4 05/05/2012 0945   GFRNONAA 46* 05/05/2012 0945   GFRAA 54* 05/05/2012 0945    Lipid Panel     Component Value Date/Time   CHOL 132 07/23/2011 0346   TRIG 83 07/23/2011 0346   HDL 49 07/23/2011 0346   CHOLHDL 2.7 07/23/2011 0346   VLDL 17 07/23/2011 0346   LDLCALC 66 07/23/2011 0346    CBC    Component Value Date/Time   WBC 7.0 05/05/2012 0945   RBC 4.99 05/05/2012 0945   HGB 13.7 05/05/2012 0945   HCT 43.7 05/05/2012 0945   PLT 172 05/05/2012 0945   MCV 87.6 05/05/2012 0945   MCH 27.5 05/05/2012 0945   MCHC 31.4 05/05/2012 0945   RDW 16.9* 05/05/2012 0945   LYMPHSABS 1.8 05/05/2012 0945   MONOABS 0.9 05/05/2012 0945   EOSABS 0.3 05/05/2012 0945   BASOSABS 0.1 05/05/2012 0945

## 2012-07-29 NOTE — Assessment & Plan Note (Signed)
Her edema is worse. We'll add spironolactone 25 mg a day. We'll check metabolic profile in a week to 10 days. She is not on potassium supplement but is on losartan and prednisone. Otherwise followup in 6 months.

## 2012-07-29 NOTE — Assessment & Plan Note (Signed)
This is not an issue at present. Continue anticoagulation and careful observation for future falls.

## 2012-08-01 ENCOUNTER — Encounter: Payer: Self-pay | Admitting: Cardiology

## 2012-08-01 DIAGNOSIS — Z0189 Encounter for other specified special examinations: Secondary | ICD-10-CM | POA: Insufficient documentation

## 2012-08-03 ENCOUNTER — Encounter (HOSPITAL_COMMUNITY): Payer: Self-pay | Admitting: *Deleted

## 2012-08-03 ENCOUNTER — Emergency Department (HOSPITAL_COMMUNITY): Payer: Medicare Other

## 2012-08-03 ENCOUNTER — Inpatient Hospital Stay (HOSPITAL_COMMUNITY)
Admission: EM | Admit: 2012-08-03 | Discharge: 2012-08-06 | DRG: 292 | Disposition: A | Discharge status: Home / Self Care | Payer: Medicare Other | Attending: Internal Medicine | Admitting: Internal Medicine

## 2012-08-03 DIAGNOSIS — Z9181 History of falling: Secondary | ICD-10-CM

## 2012-08-03 DIAGNOSIS — I5022 Chronic systolic (congestive) heart failure: Secondary | ICD-10-CM

## 2012-08-03 DIAGNOSIS — N390 Urinary tract infection, site not specified: Secondary | ICD-10-CM | POA: Diagnosis present

## 2012-08-03 DIAGNOSIS — R609 Edema, unspecified: Secondary | ICD-10-CM

## 2012-08-03 DIAGNOSIS — I10 Essential (primary) hypertension: Secondary | ICD-10-CM | POA: Diagnosis present

## 2012-08-03 DIAGNOSIS — E876 Hypokalemia: Secondary | ICD-10-CM | POA: Diagnosis present

## 2012-08-03 DIAGNOSIS — M79609 Pain in unspecified limb: Secondary | ICD-10-CM | POA: Diagnosis present

## 2012-08-03 DIAGNOSIS — M069 Rheumatoid arthritis, unspecified: Secondary | ICD-10-CM | POA: Diagnosis present

## 2012-08-03 DIAGNOSIS — I509 Heart failure, unspecified: Secondary | ICD-10-CM | POA: Diagnosis present

## 2012-08-03 DIAGNOSIS — Z7901 Long term (current) use of anticoagulants: Secondary | ICD-10-CM

## 2012-08-03 DIAGNOSIS — I5023 Acute on chronic systolic (congestive) heart failure: Principal | ICD-10-CM | POA: Diagnosis present

## 2012-08-03 DIAGNOSIS — A498 Other bacterial infections of unspecified site: Secondary | ICD-10-CM | POA: Diagnosis present

## 2012-08-03 DIAGNOSIS — Z79899 Other long term (current) drug therapy: Secondary | ICD-10-CM

## 2012-08-03 DIAGNOSIS — Z886 Allergy status to analgesic agent status: Secondary | ICD-10-CM

## 2012-08-03 DIAGNOSIS — I059 Rheumatic mitral valve disease, unspecified: Secondary | ICD-10-CM | POA: Diagnosis present

## 2012-08-03 DIAGNOSIS — I428 Other cardiomyopathies: Secondary | ICD-10-CM | POA: Diagnosis present

## 2012-08-03 DIAGNOSIS — I4891 Unspecified atrial fibrillation: Secondary | ICD-10-CM | POA: Diagnosis present

## 2012-08-03 DIAGNOSIS — Z880 Allergy status to penicillin: Secondary | ICD-10-CM

## 2012-08-03 LAB — COMPREHENSIVE METABOLIC PANEL
ALT: 20 U/L (ref 0–35)
AST: 29 U/L (ref 0–37)
Albumin: 3.2 g/dL — ABNORMAL LOW (ref 3.5–5.2)
CO2: 24 mEq/L (ref 19–32)
Calcium: 9.1 mg/dL (ref 8.4–10.5)
Creatinine, Ser: 0.98 mg/dL (ref 0.50–1.10)
GFR calc non Af Amer: 54 mL/min — ABNORMAL LOW (ref >90)
Sodium: 141 mEq/L (ref 135–145)
Total Protein: 6.1 g/dL (ref 6.0–8.3)

## 2012-08-03 LAB — CBC WITH DIFFERENTIAL/PLATELET
Basophils Absolute: 0.1 10*3/uL (ref 0.0–0.1)
Basophils Relative: 1 % (ref 0–1)
Eosinophils Absolute: 0.3 10*3/uL (ref 0.0–0.7)
Eosinophils Relative: 4 % (ref 0–5)
HCT: 41.3 % (ref 36.0–46.0)
Lymphocytes Relative: 17 % (ref 12–46)
MCH: 27.7 pg (ref 26.0–34.0)
MCHC: 31.5 g/dL (ref 30.0–36.0)
MCV: 88.1 fL (ref 78.0–100.0)
Monocytes Absolute: 0.8 10*3/uL (ref 0.1–1.0)
Platelets: 211 10*3/uL (ref 150–400)
RDW: 16.1 % — ABNORMAL HIGH (ref 11.5–15.5)
WBC: 8.7 10*3/uL (ref 4.0–10.5)

## 2012-08-03 LAB — URINE MICROSCOPIC-ADD ON

## 2012-08-03 LAB — PROTIME-INR
INR: 2.38 — ABNORMAL HIGH (ref 0.00–1.49)
Prothrombin Time: 24.9 seconds — ABNORMAL HIGH (ref 11.6–15.2)

## 2012-08-03 LAB — URINALYSIS, ROUTINE W REFLEX MICROSCOPIC
Glucose, UA: NEGATIVE mg/dL
Glucose, UA: NEGATIVE mg/dL
Ketones, ur: NEGATIVE mg/dL
Leukocytes, UA: NEGATIVE
Protein, ur: 100 mg/dL — AB
Protein, ur: 30 mg/dL — AB
Specific Gravity, Urine: >1.03 — ABNORMAL HIGH (ref 1.005–1.030)
Urobilinogen, UA: 0.2 mg/dL (ref 0.0–1.0)
Urobilinogen, UA: 0.2 mg/dL (ref 0.0–1.0)

## 2012-08-03 LAB — D-DIMER, QUANTITATIVE: D-Dimer, Quant: 0.32 ug/mL-FEU (ref 0.00–0.48)

## 2012-08-03 MED ORDER — GABAPENTIN 300 MG PO CAPS
300.0000 mg | ORAL_CAPSULE | Freq: Every day | ORAL | Status: DC
  Administered 2012-08-03 – 2012-08-05 (×3): 300 mg via ORAL
  Filled 2012-08-03 (×2): qty 1

## 2012-08-03 MED ORDER — ATORVASTATIN CALCIUM 10 MG PO TABS
10.0000 mg | ORAL_TABLET | Freq: Every day | ORAL | Status: DC
  Administered 2012-08-03 – 2012-08-05 (×3): 10 mg via ORAL
  Filled 2012-08-03 (×3): qty 1

## 2012-08-03 MED ORDER — SPIRONOLACTONE 25 MG PO TABS
25.0000 mg | ORAL_TABLET | Freq: Every day | ORAL | Status: DC
  Administered 2012-08-03 – 2012-08-06 (×4): 25 mg via ORAL
  Filled 2012-08-03 (×4): qty 1

## 2012-08-03 MED ORDER — FOLIC ACID 1 MG PO TABS
1.0000 mg | ORAL_TABLET | Freq: Every day | ORAL | Status: DC
  Administered 2012-08-03 – 2012-08-06 (×4): 1 mg via ORAL
  Filled 2012-08-03 (×4): qty 1

## 2012-08-03 MED ORDER — ENOXAPARIN SODIUM 30 MG/0.3ML ~~LOC~~ SOLN: 40.0000 mg | SUBCUTANEOUS | Status: DC

## 2012-08-03 MED ORDER — FUROSEMIDE 10 MG/ML IJ SOLN
40.0000 mg | Freq: Two times a day (BID) | INTRAMUSCULAR | Status: DC
  Administered 2012-08-03 – 2012-08-04 (×4): 40 mg via INTRAVENOUS
  Filled 2012-08-03 (×5): qty 4

## 2012-08-03 MED ORDER — METOPROLOL SUCCINATE ER 25 MG PO TB24
25.0000 mg | ORAL_TABLET | Freq: Every day | ORAL | Status: DC
  Administered 2012-08-03 – 2012-08-06 (×4): 25 mg via ORAL
  Filled 2012-08-03 (×4): qty 1

## 2012-08-03 MED ORDER — FUROSEMIDE 10 MG/ML IJ SOLN
80.0000 mg | Freq: Once | INTRAMUSCULAR | Status: AC
  Administered 2012-08-03: 80 mg via INTRAVENOUS
  Filled 2012-08-03: qty 8

## 2012-08-03 MED ORDER — WARFARIN SODIUM 5 MG PO TABS: 5.0000 mg | ORAL_TABLET | Freq: Every day | ORAL | Status: DC

## 2012-08-03 MED ORDER — HYDROXYCHLOROQUINE SULFATE 200 MG PO TABS
200.0000 mg | ORAL_TABLET | Freq: Two times a day (BID) | ORAL | Status: DC
  Administered 2012-08-03 – 2012-08-06 (×7): 200 mg via ORAL
  Filled 2012-08-03 (×9): qty 1

## 2012-08-03 MED ORDER — WARFARIN - PHYSICIAN DOSING INPATIENT
Freq: Every day | Status: DC
  Administered 2012-08-03 – 2012-08-05 (×3)

## 2012-08-03 MED ORDER — LOSARTAN POTASSIUM 50 MG PO TABS
50.0000 mg | ORAL_TABLET | Freq: Every day | ORAL | Status: DC
  Administered 2012-08-03 – 2012-08-06 (×4): 50 mg via ORAL
  Filled 2012-08-03 (×4): qty 1

## 2012-08-03 MED ORDER — WARFARIN SODIUM 2.5 MG PO TABS
2.5000 mg | ORAL_TABLET | ORAL | Status: DC
  Administered 2012-08-05: 2.5 mg via ORAL
  Filled 2012-08-03: qty 1

## 2012-08-03 MED ORDER — DIGOXIN 125 MCG PO TABS
0.1250 mg | ORAL_TABLET | Freq: Every day | ORAL | Status: DC
  Administered 2012-08-03 – 2012-08-06 (×4): 0.125 mg via ORAL
  Filled 2012-08-03 (×4): qty 1

## 2012-08-03 MED ORDER — WARFARIN SODIUM 5 MG PO TABS
5.0000 mg | ORAL_TABLET | ORAL | Status: DC
  Administered 2012-08-03 – 2012-08-04 (×2): 5 mg via ORAL
  Filled 2012-08-03 (×2): qty 1

## 2012-08-03 MED ORDER — PREDNISONE 10 MG PO TABS
5.0000 mg | ORAL_TABLET | Freq: Every day | ORAL | Status: DC
  Administered 2012-08-03 – 2012-08-06 (×4): 5 mg via ORAL
  Filled 2012-08-03 (×4): qty 1

## 2012-08-03 NOTE — ED Provider Notes (Addendum)
History   This chart was scribed for Gwyneth Sprout, MD by Toya Smothers, ED Scribe. The patient was seen in room [facility]. Patient's care was started at 0825.  CSN: [id number]  Arrival date & time 08/03/12  0825   First MD Initiated Contact with Patient 08/03/12 [phone]      Chief Complaint  Patient presents with  . Shortness of Breath    Patient is a 79 y.o. female presenting with shortness of breath. The history is provided by the patient. No language interpreter was used.  Shortness of Breath  Episode onset: 4 days ago. The onset was gradual. The problem occurs continuously. The problem has been gradually worsening. The problem is moderate. Relieved by: sitting up. The symptoms are aggravated by activity. Associated symptoms include cough and shortness of breath. Pertinent negatives include no fever. The cough has no precipitants. The cough is non-productive and dry. Nothing relieves the cough.    Marissa [name] is a 79 y.o. female with h/o CHF, bronchitis, edema, and HNT, who presents to the Emergency Department complaining of 4 days of recurrent, gradual onset, constant, moderate SOB after beginning Spironolactone treatement. Symptoms are worse when moving and laying down, while improved when sitting up. Typically healthy at baseline, CC represents a moderate deviation. Pt reports progressive worsening off symptoms in the past 2 days with associate non-productive cough, sinus HA, and mild abdominal distention. Symptoms have not been treated PTA. No fever, chills, congestion, rhinorrhea, chest tighness, or n/v/d. Pt denies use of tobacco products, consumption of alcohol, and use of illicit drugs.     Past Medical History  Diagnosis Date  . Edema   . Atrial fibrillation     A.  Chronic Coumadin  . Neoplasm     unspecifide nature digestive system  . MVP (mitral valve prolapse)   . Hip fracture     left  . Hypertension   . Nonischemic cardiomyopathy     A.  07/23/11 - Echo: EF 35%;   B. 07/24/2011 - Cath: nonobs dzs ef 35%, 2+MR   . Systolic CHF, chronic     A. Diag 07/2011, EF 35%  . Falls frequently     Past Medical History  Diagnosis Date  . Edema   . Atrial fibrillation     A.  Chronic Coumadin  . Neoplasm     unspecifide nature digestive system  . MVP (mitral valve prolapse)   . Hip fracture     left  . Hypertension   . Nonischemic cardiomyopathy     A.  07/23/11 - Echo: EF 35%;  B. 07/24/2011 - Cath: nonobs dzs ef 35%, 2+MR   . Systolic CHF, chronic     A. Diag 07/2011, EF 35%  . Falls frequently     Past Surgical History  Procedure Date  . Appendectomy   . Abdominal hysterectomy   . Hernia repair 09/2008  . Hernia repair 04/17/11    lap ventral incisional hernia repair with mesh     No family history on file.  History  Substance Use Topics  . Smoking status: Never Smoker   . Smokeless tobacco: Never Used  . Alcohol Use: Yes     Comment: rare   Review of Systems  Constitutional: Negative for fever.  Respiratory: Positive for cough and shortness of breath. Negative for chest tightness.   Gastrointestinal: Positive for abdominal pain. Negative for nausea, vomiting and diarrhea.  All other systems reviewed and are negative.    Allergies  Codeine  and Penicillins  Home Medications   Current Outpatient Rx  Name  Route  Sig  Dispense  Refill  . ALENDRONATE SODIUM 70 MG PO TABS   Oral   Take 70 mg by mouth every 7 (seven) days. Take with a full glass of water on an empty stomach. Takes on Saturday         . ATORVASTATIN CALCIUM 10 MG PO TABS   Oral   Take 10 mg by mouth daily.         Marland Kitchen VITAMIN D 2000 UNITS PO CAPS   Oral   Take 2,000 Units by mouth daily.         Marland Kitchen DIGOXIN 0.125 MG PO TABS   Oral   Take 0.125 mg by mouth daily.         Marland Kitchen FOLIC ACID 1 MG PO TABS   Oral   Take 1 mg by mouth daily.         . FUROSEMIDE 40 MG PO TABS   Oral   Take 1 tablet (40 mg total) by mouth daily.   30 tablet   11   .  GABAPENTIN 300 MG PO CAPS   Oral   Take 300 mg by mouth at bedtime.         Marland Kitchen HYDROCODONE-ACETAMINOPHEN 5-500 MG PO TABS   Oral   Take 1 tablet by mouth daily.          Marland Kitchen GENTEAL OP   Both Eyes   Place 1 drop into both eyes daily as needed. Tired Eyes         . LOSARTAN POTASSIUM 50 MG PO TABS   Oral   Take 50 mg by mouth daily.         Marland Kitchen METOPROLOL SUCCINATE ER 50 MG PO TB24   Oral   Take 25 mg by mouth daily. Take with or immediately following a meal.         . PREDNISONE 5 MG PO TABS   Oral   Take 5 mg by mouth daily.         Marland Kitchen SPIRONOLACTONE 25 MG PO TABS   Oral   Take 1 tablet (25 mg total) by mouth daily.   30 tablet   5   . WARFARIN SODIUM 5 MG PO TABS   Oral   Take 2.5-5 mg by mouth daily. 2.5mg  on Mondays and Thursdays.  5mg  on all other days of the week.           BP 137/95  Pulse 77  Temp 98 F (36.7 C)  Resp 24  Ht 5\' 5"  (1.651 m)  Wt 186 lb (84.369 kg)  BMI 30.95 kg/m2  SpO2 95%  Physical Exam  Nursing note and vitals reviewed. Constitutional: She is oriented to person, place, and time. She appears well-developed and well-nourished. No distress.  HENT:  Head: Normocephalic and atraumatic.  Eyes: Conjunctivae normal and EOM are normal.  Neck: Neck supple. No tracheal deviation present.  Cardiovascular: Normal rate.        Irregularly irregular heart rate.  Pulmonary/Chest: Tachypnea noted. No respiratory distress.       Mild rales at lung bases.  Abdominal: Soft. She exhibits no distension.  Musculoskeletal: Normal range of motion.       2+ edema of RLE. 3+ edema of LLE up to midshin.  Neurological: She is alert and oriented to person, place, and time. No sensory deficit.  Skin: Skin is dry.  Psychiatric: She  has a normal mood and affect. Her behavior is normal.    ED Course  Procedures DIAGNOSTIC STUDIES: Oxygen Saturation is 95% on room air, adequate by my interpretation.    COORDINATION OF CARE: 08:30- Evaluated Pt.  Pt is awake, alert, and without distress. 08:37- Patient understand and agree with initial ED impression and plan with expectations set for ED visit.  Labs Reviewed  CBC WITH DIFFERENTIAL - Abnormal; Notable for the following:    RDW 16.1 (*)     All other components within normal limits  COMPREHENSIVE METABOLIC PANEL - Abnormal; Notable for the following:    Glucose, Bld 155 (*)     Albumin 3.2 (*)     GFR calc non Af Amer 54 (*)     GFR calc Af Amer 63 (*)     All other components within normal limits  PRO B NATRIURETIC PEPTIDE - Abnormal; Notable for the following:    Pro B Natriuretic peptide (BNP) 10951.0 (*)     All other components within normal limits  PROTIME-INR - Abnormal; Notable for the following:    Prothrombin Time 24.9 (*)     INR 2.38 (*)     All other components within normal limits  D-DIMER, QUANTITATIVE  TROPONIN I  URINALYSIS, ROUTINE W REFLEX MICROSCOPIC   Dg Chest 2 View  08/03/2012  *RADIOLOGY REPORT*  Clinical Data: Shortness of breath, history of congestive heart failure  CHEST - 2 VIEW  Comparison: 07/23/2011  Findings: There is significant cardiomegaly with progression from prior exam.  Small amount of fluid or thickening noted right minor fissure.  No acute infiltrate or pulmonary edema.  Central mild bronchitic changes.  Bony thorax is stable.  IMPRESSION: Significant cardiomegaly with progression from prior exam.  Small amount of fluid or thickening right minor fissure.  No focal infiltrate or pulmonary edema.  Central mild bronchitic changes.   Original Report Authenticated By: Natasha Mead, M.D.      Date: 08/03/2012  Rate: 102  Rhythm: atrial fibrillation  QRS Axis: left  Intervals: normal  ST/T Wave abnormalities: nonspecific ST/T changes  Conduction Disutrbances:none  Narrative Interpretation:   Old EKG Reviewed: unchanged   1. CHF (congestive heart failure)       MDM   Patient with a history of CHF who presents today after approximately  4-5 days with worsening shortness of breath most significantly starting last night. Just from her car to wheelchair him being put into the room she was breathing 24-25 times a minute and only able to speak in one to 2 word sentences. She has mild rales but no wheezing on exam. She has no prior lung history. She states recently she started spironolactone on Tuesday and feels the symptoms worsened since. Possibly some mild distention in her abdomen and she was up 3 pounds her doctor's office on Monday when she was asymptomatic. She did not know if she has gained weight since. Denies any chest pain or abdominal pain and has 3+ edema in her lower extremities and some mild rales on lung exam.  Biggest concern is for CHF at this time however also could be PE. Infectious etiology is less likely given patient's symptoms she's currently afebrile here. CBC, CMP, BNP, INR, UA, d-dimer, troponin pending. EKG shows unchanged atrial. Chest x-ray pending.  Patient's O2 sats have remained greater than 93% however she was placed on oxygen for comfort.  9:47 AM Labs indicate worsening CHF. Chest x-ray with worsening cardiomegaly but no signs of overt  pulmonary edema or effusions. This is consistent with patient's clinical picture. Due to the severely short of breath for short distances. She would benefit from IV Lasix and admission. Patient given 80 mg of IV Lasix she has a normal creatinine and a Foley catheter placed.  9:53 AM Pt's urine was contaminated and she denies any UTI sx.  Since foley being placed with send a cathed sample.   I personally performed the services described in this documentation, which was scribed in my presence.  The recorded information has been reviewed and considered.   Gwyneth Sprout, MD 08/03/12 2130  Gwyneth Sprout, MD 08/03/12 [id number]  Gwyneth Sprout, MD 08/03/12 8469  Gwyneth Sprout, MD 08/03/12 [phone]

## 2012-08-03 NOTE — H&P (Signed)
Marissa [name], Marissa [name]                ACCOUNT NO.:  [id number]  MEDICAL RECORD NO.:  [id number]  LOCATION:  A306                          FACILITY:  [facility]  PHYSICIAN:  Cayci Mcnabb G. Renard Matter, MD   DATE OF BIRTH:  29-Oct-1932  DATE OF ADMISSION:  08/03/2012 DATE OF DISCHARGE:  LH                             HISTORY & PHYSICAL   HISTORY OF PRESENT ILLNESS:  A 79 year old white female admitted to the ED with a chief complaint of being shortness of breath.  This patient has a history of chronic atrial fibrillation and chronic systolic congestive heart failure with ejection fraction of 35%.  She was seen and evaluated by ED physician.  Chest x-ray showed significant cardiomegaly with progression from prior exam and small amount of fluid, right minor fissure.  No focal infiltrate or pulmonary edema evident. The patient was given 80 mg of Lasix intravenously and began to diurese, began to feel better, but it was felt she should be admitted for further evaluation and what was felt to be congestive heart failure.  SOCIAL HISTORY:  The patient does not smoke or drink alcohol.  PAST MEDICAL HISTORY:  History of atrial fibrillation, previous hip fracture, hypertension, nonischemic cardiomyopathy with ejection fraction of 35%, systolic congestive heart failure, chronic ejection fraction 35%, and neoplasm of digestive system.  PAST SURGICAL HISTORY:  Prior history of appendectomy, abdominal hysterectomy, hernia repair on 2 occasions.  Apparently, this was ventral hernia.  REVIEW OF SYSTEMS:  HEENT:  Negative.  CARDIOPULMONARY:  The patient has had some shortness of breath and intermittent cough over the past few days.  GI:  No nausea, vomiting, diarrhea, but intermittent abdominal pain.  GU:  No dysuria, hematuria.  ALLERGIES:  Codeine and penicillins.  MEDICATIONS:  Alendronate sodium 70 mg every 7 days, atorvastatin 10 mg daily, vitamin D 2000 units daily, digoxin 0.125 mg daily, folic acid 1 mg  daily, furosemide 40 mg daily, gabapentin 300 mg at bedtime, hydrocodone, acetaminophen 5/500 every 4 hours p.r.n. for pain, GenTeal ophthalmic drops as needed, losartan, potassium 50 mg daily, metoprolol 25 mg daily, prednisone 5 mg daily, warfarin 2.5 mg on Mondays and Thursdays, and 5 mg the remainder of the week.  PHYSICAL EXAMINATION:  GENERAL:  The patient is alert and oriented. VITAL SIGNS:  Blood pressure 132/76, respirations 18, pulse 73, temp 97.7. HEENT:  Eyes PERRLA.  TMs negative.  Oropharynx benign. NECK:  Supple.  No JVD or thyroid abnormalities. HEART:  Irregular rhythm. LUNGS:  Clear to P and A. ABDOMEN:  No palpable organs or masses. EXTREMITIES:  2+ edema bilaterally. NEUROLOGICAL:  The patient is alert and oriented.  Cranial nerves intact.  No sensory or motor abnormalities. SKIN:  Warm and dry.  ASSESSMENT:  The patient is admitted with exacerbation of congestive heart failure with elevated troponin 10,951 and she also has atrial fibrillation and history of hypertension.  PLAN:  To continue to monitor.  The patient to continue diuresis.  We will check electrolytes.  Continue Coumadin and other medications.  We will obtain Cardiology consult.  The patient is more stable at this particular time.     Krishika Bugge G. Renard Matter, MD  AGM/MEDQ  D:  08/03/2012  T:  08/03/2012  Job:  [id number]

## 2012-08-03 NOTE — ED Notes (Signed)
Pt c/o sob since Tuesday, cough since yesterday, ?sinus headache Thursday, cough and sob became worse during the night, worse with exertion, pt states "I slept in my recliner during the night, was able to sleep better in recliner than in the bed", pt has bilateral swelling to lower extremities, states "this is normal for me", has recent change to medication, abd pain during night as well,

## 2012-08-04 LAB — BASIC METABOLIC PANEL
Chloride: 102 mEq/L (ref 96–112)
GFR calc Af Amer: 50 mL/min — ABNORMAL LOW (ref >90)
Potassium: 3.5 mEq/L (ref 3.5–5.1)
Sodium: 145 mEq/L (ref 135–145)

## 2012-08-04 LAB — PROTIME-INR
INR: 2.41 — ABNORMAL HIGH (ref 0.00–1.49)
Prothrombin Time: 25.1 seconds — ABNORMAL HIGH (ref 11.6–15.2)

## 2012-08-05 DIAGNOSIS — I059 Rheumatic mitral valve disease, unspecified: Secondary | ICD-10-CM

## 2012-08-05 DIAGNOSIS — I4891 Unspecified atrial fibrillation: Secondary | ICD-10-CM

## 2012-08-05 DIAGNOSIS — I509 Heart failure, unspecified: Secondary | ICD-10-CM

## 2012-08-05 DIAGNOSIS — Z7901 Long term (current) use of anticoagulants: Secondary | ICD-10-CM

## 2012-08-05 LAB — TSH: TSH: 0.801 u[IU]/mL (ref 0.350–4.500)

## 2012-08-05 LAB — URINE CULTURE: Colony Count: >=100000

## 2012-08-05 LAB — BASIC METABOLIC PANEL
Calcium: 9.8 mg/dL (ref 8.4–10.5)
GFR calc Af Amer: 54 mL/min — ABNORMAL LOW (ref >90)
GFR calc non Af Amer: 47 mL/min — ABNORMAL LOW (ref >90)
Potassium: 2.8 mEq/L — ABNORMAL LOW (ref 3.5–5.1)
Sodium: 145 mEq/L (ref 135–145)

## 2012-08-05 LAB — PROTIME-INR: Prothrombin Time: 23.5 seconds — ABNORMAL HIGH (ref 11.6–15.2)

## 2012-08-05 MED ORDER — CEPHALEXIN 250 MG PO CAPS
250.0000 mg | ORAL_CAPSULE | Freq: Three times a day (TID) | ORAL | Status: DC
  Administered 2012-08-05 – 2012-08-06 (×4): 250 mg via ORAL
  Filled 2012-08-05 (×5): qty 1

## 2012-08-05 MED ORDER — POTASSIUM CHLORIDE CRYS ER 20 MEQ PO TBCR
40.0000 meq | EXTENDED_RELEASE_TABLET | Freq: Three times a day (TID) | ORAL | Status: DC
  Administered 2012-08-05 – 2012-08-06 (×4): 40 meq via ORAL
  Filled 2012-08-05 (×4): qty 2

## 2012-08-05 MED ORDER — FUROSEMIDE 10 MG/ML IJ SOLN
40.0000 mg | Freq: Two times a day (BID) | INTRAMUSCULAR | Status: DC
  Administered 2012-08-05 – 2012-08-06 (×2): 40 mg via INTRAVENOUS
  Filled 2012-08-05 (×2): qty 4

## 2012-08-05 NOTE — Care Management Note (Signed)
    Page 1 of 1   08/06/2012     11:47:07 AM   CARE MANAGEMENT NOTE 08/06/2012  Patient:  Marissa [name], Marissa [name]   Account Number:  [id number]  Date Initiated:  08/05/2012  Documentation initiated by:  Sharrie Rothman  Subjective/Objective Assessment:   Pt admitted from home with CHF. Pt lives alone but has children who are very active in the care of the pt. Pt has a cane, walker, and shower rails for home use. 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     Action/Plan:   No CM or HH needs noted.   Anticipated DC Date:  08/08/2012   Anticipated DC Plan:  HOME/SELF CARE         Choice offered to / List presented to:             Status of service:  Completed, signed off Medicare Important Message given?  YES (If response is "NO", the following Medicare IM given date fields will be blank) Date Medicare IM given:  08/06/2012 Date Additional Medicare IM given:    Discharge Disposition:  HOME/SELF CARE  Per UR Regulation:    If discussed at Long Length of Stay Meetings, dates discussed:    Comments:  08/06/12 1145 Arlyss Queen, RN BSN CM Pt discharged home today. No CM or HH needs noted.  08/05/12 1329 Arlyss Queen, RN BSN CM

## 2012-08-05 NOTE — Plan of Care (Signed)
Problem: Phase I Progression Outcomes Goal: Initial discharge plan identified Outcome: Completed/Met Date Met:  08/05/12 Return home at discharge.

## 2012-08-05 NOTE — Progress Notes (Signed)
*  PRELIMINARY RESULTS* Echocardiogram 2D Echocardiogram has been performed.  Conrad Kenneth 08/05/2012, 12:16 PM

## 2012-08-05 NOTE — Consult Note (Signed)
CARDIOLOGY CONSULT NOTE  Patient ID: Marissa [name] MRN: [id number] DOB/AGE: 07/23/1932 79 y.o.  Admit date: 08/03/2012 Referring Physician: Gerarda Gunther, MD Primary Cardiologist:Wall, Maisie Fus Reason for Consultation: Systolic CHF, atrial fib  Active Problems:  Atrial fibrillation  Chronic anticoagulation  Nonischemic cardiomyopathy  Chronic systolic heart failure  HPI: [name] is a 79 y/o patient of Dr.Wall admitted with acute on chronic systolic CHF, with increasing shortness of breath. Pro-BNP of >10,000 with CXR negative for pulmonary edema or CHF. She has a history of NICM, EF of  35% with diffuse hypokinesis per 77/13/13 echo, atrial fibrillation on chronic coumadin tx.. She saw Dr.Wall in the office one week ago with complaints of edema and shortness of breath. She has gained 6 lbs in one month, 2 lbs since last office visit.  She was placed on spironolactone 25 mg and continued on her current medications, with follow up labs in one week. She began to have increasing symptoms 3 days after that office appointment with significant palpitations. Initial cardiac enzymes are negative X1. EKG on admission demonstrated afib with RVR, rate of 102 bpm. She was placed on IV lasix and has good diureses of 2900 cc each of the last 2 days with significant improvement in symptoms.     Review of systems complete and found to be negative unless listed above   Past Medical History  Diagnosis Date  . Edema   . Atrial fibrillation     A.  Chronic Coumadin  . Neoplasm     unspecifide nature digestive system  . MVP (mitral valve prolapse)   . Hip fracture     left  . Hypertension   . Nonischemic cardiomyopathy     A.  07/23/11 - Echo: EF 35%;  B. 07/24/2011 - Cath: nonobs dzs ef 35%, 2+MR   . Systolic CHF, chronic     A. Diag 07/2011, EF 35%  . Falls frequently     History reviewed. No pertinent family history.  History   Social History  . Marital Status: Widowed    Spouse Name: N/A    Number of Children: N/A  . Years of Education: N/A   Occupational History  . retired    Social History Main Topics  . Smoking status: Never Smoker   . Smokeless tobacco: Never Used  . Alcohol Use: Yes     Comment: rare  . Drug Use: No  . Sexually Active: Not on file   Other Topics Concern  . Not on file   Social History Narrative  . No narrative on file    Past Surgical History  Procedure Date  . Appendectomy   . Abdominal hysterectomy   . Hernia repair 09/2008  . Hernia repair 04/17/11    lap ventral incisional hernia repair with mesh      Prescriptions prior to admission  Medication Sig Dispense Refill  . alendronate (FOSAMAX) 70 MG tablet Take 70 mg by mouth every 7 (seven) days. Take with a full glass of water on an empty stomach. Takes on Saturday      . atorvastatin (LIPITOR) 10 MG tablet Take 10 mg by mouth daily.      . Cholecalciferol (VITAMIN D) 2000 UNITS CAPS Take 2,000 Units by mouth daily.      . digoxin (LANOXIN) 0.125 MG tablet Take 0.125 mg by mouth daily.      . folic acid (FOLVITE) 1 MG tablet Take 1 mg by mouth daily.      Marland Kitchen  furosemide (LASIX) 40 MG tablet Take 1 tablet (40 mg total) by mouth daily.  30 tablet  11  . gabapentin (NEURONTIN) 300 MG capsule Take 300 mg by mouth at bedtime.      Marland Kitchen HYDROcodone-acetaminophen (VICODIN) 5-500 MG per tablet Take 1 tablet by mouth daily.       . hydroxychloroquine (PLAQUENIL) 200 MG tablet Take 1 tablet by mouth Twice daily.      Marland Kitchen losartan (COZAAR) 50 MG tablet Take 50 mg by mouth daily.      . metoprolol succinate (TOPROL-XL) 50 MG 24 hr tablet Take 25 mg by mouth daily. Take with or immediately following a meal.      . predniSONE (DELTASONE) 5 MG tablet Take 5 mg by mouth daily.      . Pseudoeph-Doxylamine-DM-APAP (NYQUIL PO) Take 30 mLs by mouth daily as needed. For cough and cold      . spironolactone (ALDACTONE) 25 MG tablet Take 1 tablet (25 mg total) by mouth daily.  30 tablet  5  .  warfarin (COUMADIN) 5 MG tablet Take 2.5-5 mg by mouth daily. 2.5mg  on Mondays and Thursdays.  5mg  on all other days of the week.      . Hypromellose (GENTEAL OP) Place 1 drop into both eyes daily as needed. Tired Eyes       Echocardiogram: 07/22/12 Left ventricle: The cavity size was normal. Wall thickness was normal. The estimated ejection fraction was 35%. Diffuse hypokinesis. Doppler parameters are consistent with high ventricular filling pressure. - Right atrium: The atrium was mildly dilated.   Physical Exam: Blood pressure 129/79, pulse 78, temperature 98.3 F (36.8 C), temperature source Oral, resp. rate 18, height 5\' 5"  (1.651 m), weight 186 lb (84.369 kg), SpO2 95.00%.   General: Well developed, well nourished, in no acute distress Head: Eyes PERRLA, No xanthomas.   Normal cephalic and atramatic  Lungs: Diminished bibasilar, without wheezes.  Heart: HRIR S1 S2, with soft 1/6 systolic murmur.  Pulses are 2+ & equal.            No carotid bruit. No JVD.  No abdominal bruits. No femoral bruits. Abdomen: Bowel sounds are positive, abdomen soft and non-tender without masses or                  Hernia's noted. Msk:  Back khyphosis noted,uses walker for ambulation.  Normal strength and tone for age. Extremities: No clubbing, cyanosis, with non-pitting edema.  DP +1 Neuro: Alert and oriented X 3. Psych:  Good affect, responds appropriately  Labs:   Lab Results  Component Value Date   WBC 8.7 08/03/2012   HGB 13.0 08/03/2012   HCT 41.3 08/03/2012   MCV 88.1 08/03/2012   PLT 211 08/03/2012    Pro-BNP 10,951.0  Lab 08/05/12 0346 08/03/12 0840  NA 145 --  K 2.8* --  CL 101 --  CO2 35* --  BUN 23 --  CREATININE 1.11* --  CALCIUM 9.8 --  PROT -- 6.1  BILITOT -- 1.0  ALKPHOS -- 53  ALT -- 20  AST -- 29  GLUCOSE 94 --   Lab Results  Component Value Date   CKTOTAL 208* 07/23/2011   CKMB 3.4 07/23/2011   TROPONINI <0.30 08/03/2012    Lab Results  Component Value Date    CHOL 132 07/23/2011   CHOL 132 07/22/2011   Lab Results  Component Value Date   HDL 49 07/23/2011   HDL 50 4/54/0981   Lab Results  Component Value Date   LDLCALC 66 07/23/2011   LDLCALC 68 07/22/2011   Lab Results  Component Value Date   TRIG 83 07/23/2011   TRIG 70 07/22/2011   Lab Results  Component Value Date   CHOLHDL 2.7 07/23/2011   CHOLHDL 2.6 07/22/2011      Radiology: CXR 07/22/12 1. Cardiomegaly without edema. 2. Mild subsegmental atelectasis right midlung. 3. Small bilateral pleural effusions.   ZOX:WRUEAV fibrillation rate of 102 bpm  ASSESSMENT AND PLAN:  1. Atrial fibrillation with RVR: Rate is now much better controlled. On metoprolol XL 50 mg daily and digoxin at home, now decreased to 25 mg XL during this admission but continued on digoxin. She has had recent echo one month ago, with repeat echo (limited study). Breathing status improved. Uncertain etiology for rapid HR. Check TSH.  Doubt PE as she is on anticoagulation.  2.Acute Systolic Dysfunction in the setting  NICM with complications of CHF: Has been having ongoing complaints of edema and increased dyspnea which has worsened since last week.Spironolactone did not help with edema but only took it for 3 days. She is now on IV lasix 40 mg IV BID with good diureses. IF echo is demonstrates worsened EF, may need to consider ICD pacemaker with more aggressive rate control medications. Now hemodynamically stable and symptom free.  3. History of frequent falls: None recently, Uses walker for safety.   Bettey Mare. [name] NP [facility] 08/05/2012, 11:30 AM  Cardiology Attending Patient interviewed and examined. Discussed with Joni Reining, NP.  Above note annotated and modified based upon my findings.  Ms. Moster has a history of recurrent congestive heart failure with moderately depressed LV systolic function and no coronary artery disease. She presents with progressive dyspnea and is found to be fluid  overloaded without pulmonary edema. Chronic atrial fibrillation is unchanged.  She is improved following a 5 L diuresis. Weights have not been measured for the past 72 hours. If she remains stable, and weight has returned to baseline, she can probably be discharged tomorrow.  Blende Bing, MD 08/05/2012, 6:53 PM

## 2012-08-05 NOTE — Progress Notes (Signed)
NAMEWINDY, DUDEK                ACCOUNT NO.:  [id number]  MEDICAL RECORD NO.:  [id number]  LOCATION:  A306                          FACILITY:  [facility]  PHYSICIAN:  Kia Varnadore G. Renard Matter, MD   DATE OF BIRTH:  [birth year]  DATE OF PROCEDURE: DATE OF DISCHARGE:                                PROGRESS NOTE   SUBJECTIVE:  This patient was admitted with exacerbation of congestive heart failure and atrial fibrillation and elevated proBNP.  She feels better today, has been diuresing.  Remains on IV Lasix.  OBJECTIVE:  VITAL SIGNS:  Blood pressure 136/83, respirations 18, pulse 82, temp 98.1. LUNGS:  Diminished breath sounds. HEART:  Regular rhythm. ABDOMEN:  No palpable organs or masses. EXTREMITIES:  Minimal ankle edema.  ASSESSMENT:  The patient was admitted with exacerbation of congestive heart failure.  She has chronic atrial fibrillation and history of hypertension.  PLAN:  To continue to monitor her electrolytes.  Continue IV furosemide and other medications.     Jesselee Poth G. Renard Matter, MD     AGM/MEDQ  D:  08/04/2012  T:  08/05/2012  Job:  [id number]

## 2012-08-05 NOTE — Progress Notes (Signed)
Marissa [name], Marissa [name]                ACCOUNT NO.:  [id number]  MEDICAL RECORD NO.:  [id number]  LOCATION:                                 FACILITY:  PHYSICIAN:  Kingsley Callander. [name], MD       DATE OF BIRTH:  29-Apr-1933  DATE OF PROCEDURE:  08/05/2012 DATE OF DISCHARGE:                                PROGRESS NOTE   Marissa [name] was admitted Saturday morning after she presented to the emergency room with shortness of breath.  She was found to have congestive heart failure.  She did not have pulmonary edema on her chest x-ray but her proBNP was 16109.  She was admitted by Dr. Megan Mans and started on IV Lasix.  She had previously been on 40 mg of oral Lasix at home with 25 mg of spironolactone.  The spironolactone had recently been added by her cardiologist and she states that it made her feel worse.  She is feeling better today.  She is much less short of breath.  She notes less edema in her legs.  PHYSICAL EXAMINATION:  VITAL SIGNS:  Temperature is 98.3 with a pulse of 76, and blood pressure of 112/71, oxygen saturation is 95% on 2 L.  She had a negative I and O of 2970. LUNGS:  Basilar crackles. HEART:  Irregularly irregular. ABDOMEN:  Soft, nondistended, nontender. EXTREMITIES:  No edema.  IMPRESSION/PLAN: 1. Congestive heart failure.  Continue IV Lasix.  Consult Cardiology.     Obtain echocardiogram. 2. Chronic atrial fibrillation.  She is satisfactorily anticoagulated     with Coumadin with an INR of 2.20. 3. Hypokalemia.  Serum potassium has dropped from 3.5-2.8.  We will     supplement orally. 4. Escherichia coli urinary tract infection.  Start treatment with     Keflex.     Kingsley Callander. [name], MD     ROF/MEDQ  D:  08/05/2012  T:  08/05/2012  Job:  [id number]

## 2012-08-06 DIAGNOSIS — I5022 Chronic systolic (congestive) heart failure: Secondary | ICD-10-CM

## 2012-08-06 LAB — BASIC METABOLIC PANEL
BUN: 24 mg/dL — ABNORMAL HIGH (ref 6–23)
Chloride: 102 mEq/L (ref 96–112)
Creatinine, Ser: 1.22 mg/dL — ABNORMAL HIGH (ref 0.50–1.10)
GFR calc Af Amer: 48 mL/min — ABNORMAL LOW (ref >90)
Glucose, Bld: 100 mg/dL — ABNORMAL HIGH (ref 70–99)

## 2012-08-06 LAB — PROTIME-INR: Prothrombin Time: 22.4 seconds — ABNORMAL HIGH (ref 11.6–15.2)

## 2012-08-06 MED ORDER — CEPHALEXIN 250 MG PO CAPS: 250.0000 mg | ORAL_CAPSULE | Freq: Three times a day (TID) | ORAL | Status: DC

## 2012-08-06 MED ORDER — FUROSEMIDE 40 MG PO TABS: 40.0000 mg | ORAL_TABLET | Freq: Two times a day (BID) | ORAL | Status: DC

## 2012-08-06 MED ORDER — ACETAMINOPHEN 325 MG PO TABS: 650.0000 mg | ORAL_TABLET | Freq: Four times a day (QID) | ORAL | Status: DC | PRN

## 2012-08-06 MED ORDER — POTASSIUM CHLORIDE ER 10 MEQ PO TBCR: 20.0000 meq | EXTENDED_RELEASE_TABLET | Freq: Two times a day (BID) | ORAL | Status: DC

## 2012-08-06 NOTE — Progress Notes (Signed)
UR Chart Review Completed  

## 2012-08-06 NOTE — Progress Notes (Signed)
SUBJECTIVE:Complaining of left foot plantar pain which began last evening and chills. Did not sleep well.  Active Problems:  Atrial fibrillation  Chronic anticoagulation  Nonischemic cardiomyopathy  Chronic systolic heart failure  LABS: Basic Metabolic Panel:  Basename 08/06/12 0444 08/05/12 0346  NA 144 145  K 4.1 2.8*  CL 102 101  CO2 33* 35*  GLUCOSE 100* 94  BUN 24* 23  CREATININE 1.22* 1.11*  CALCIUM 10.2 9.8  MG -- --  PHOS -- --   PHYSICAL EXAM BP 111/104  Pulse 98  Temp 97.6 F (36.4 C) (Oral)  Resp 20  Ht 5\' 5"  (1.651 m)  Wt 186 lb (84.369 kg)  BMI 30.95 kg/m2  SpO2 97% General: Well developed, well nourished, in no acute distress Head: Eyes PERRLA, No xanthomas.   Normal cephalic and atramatic  Lungs: Crackles in the left base on inspiration. No wheezes, occasional cough.  Heart: HRIR S1 S2, No MRG .  Pulses are 2+ & equal.            No carotid bruit. No JVD.  No abdominal bruits. No femoral bruits. Abdomen: Bowel sounds are positive, abdomen soft and non-tender without masses or                  Hernia's noted. Msk:  Back normal, normal gait. Normal strength and tone for age. Extremities: No clubbing, cyanosis or edema.  DP +1 Neuro: Alert and oriented X 3. Psych:  Good affect, responds appropriately  TELEMETRY: Reviewed telemetry pt in: Afib 90's-100's.  Echo 08/05/2012: severe left ventricular systolic dysfunction with estimated EF of 15-20%, worse than previously noted.  Mild aortic stenosis and mitral regurgitation.     ASSESSMENT AND PLAN:  1. Atrial fibrillation with RVR: Heart rate is not well controlled overnight. May be related to left foot plantar pain for  which she has not received adequate pain control. She had been on metoprolol 50 mg XL at home, may need to increase the dose if heart rate remains elevated. Continue digoxin and warfarin.   2. Acute Systolic Dysfunction in the setting NICM with complications of CHF: She is continued on  IV lasix 40 mg BID. Weights have not been completed.    3. Questionable Plantar fascitis: Symptoms of plantar foot pain on the left. Tylenol ordered per PCP.   4. Hypertension: BP stable.  Bettey Mare. [name] NP [facility] 08/06/2012, 8:53 AM  Cardiology Attending Patient interviewed and examined. Discussed with Joni Reining, NP.  Above note annotated and modified based upon my findings.  CHF is compensated, but cardiomyopathy is severe.  Possibly, contractility is worse as the result of her uncontrolled heart rate.  If LV function does not recover, consideration of AICD can be raised with patient.  Early return appointment with Dr. Daleen Squibb will be scheduled.  Medical therapy will be optimized with an increase in her dose of beta blocker and ARB as tolerated.  Paris Bing, MD 10:49 AM

## 2012-08-06 NOTE — Progress Notes (Signed)
States understanding of discharge instructions.

## 2012-08-06 NOTE — Discharge Summary (Signed)
[name], Marissa [name]                ACCOUNT NO.:  [id number]  MEDICAL RECORD NO.:  [id number]  LOCATION:                                 FACILITY:  PHYSICIAN:  Kingsley Callander. [name], MD       DATE OF BIRTH:  Oct 11, 1932  DATE OF ADMISSION:  08/03/2012 DATE OF DISCHARGE:  01/28/2014LH                              DISCHARGE SUMMARY   DISCHARGE DIAGNOSES: 1. Acute on chronic systolic congestive heart failure. 2. Chronic atrial fibrillation. 3. Rheumatoid arthritis. 4. Hypokalemia.  DISCHARGE MEDICATIONS: 1. Lasix 40 mg b.i.d. 2. Potassium 20 mEq b.i.d. 3. Cephalexin 250 mg t.i.d. for 6 more days. 4. Alendronate 70 mg weekly. 5. Lipitor 10 mg daily. 6. Vitamin D 2000 units daily. 7. Digoxin 0.125 mg daily. 8. Folic acid 1 mg daily. 9. Gabapentin 300 mg daily. 10.Vicodin 5/500 daily p.r.n. 11.Plaquenil 200 mg b.i.d. 12.GenTeal 1 drop in both eyes as needed. 13.Losartan 50 mg daily. 14.Toprol-XL 25 mg daily. 15.Prednisone 5 mg daily. 16.Aldactone 25 mg daily. 17.Coumadin 2.5 mg on Mondays and Thursdays, 5 mg on other days.  HOSPITAL COURSE:  This patient is a 79 year old female with chronic AFib and CHF, who presented to the emergency room with shortness of breath and lower extremity edema.  She was found to be in congestive heart failure, but did not show signs of pulmonary edema on chest x-ray.  She had an elevated BNP of 10,951.  She was admitted and treated with IV Lasix.  She had a 5 L diuresis and felt much better.  She was seen in Cardiology consultation.  She has had an echo, however, it remains pending.  BUN and creatinine were 22 and 0.98 on admission and are 24 and 1.22 on discharge.  Potassium dropped during her hospitalization at 2.8, but improved to 4.1 with supplementation.  She was satisfactorily anticoagulated with an INR of 2.38 on admission with subsequent INRs of 2.4, 2.2, and 2.06.  She was euthyroid with a TSH of 0.80.  She had a positive urine culture for  Escherichia coli which was sensitive to cephalosporins.  She has been treated with cephalexin.  The patient is much improved and is stable for discharge on the morning of August 06, 2012.  She will be seen in followup in my office in 1 week.  Her Lasix dose will be increased to 40 mg b.i.d. and she will be continued on spironolactone 25 mg daily, potassium is added at 20 mEq b.i.d.  She will have a metabolic panel in 1 week.     Kingsley Callander. [name], MD     ROF/MEDQ  D:  08/06/2012  T:  08/06/2012  Job:  [id number]  cc:   Thomas C. Wall, MD, [facility]

## 2012-08-13 ENCOUNTER — Ambulatory Visit (INDEPENDENT_AMBULATORY_CARE_PROVIDER_SITE_OTHER): Payer: Medicare Other | Admitting: Cardiology

## 2012-08-13 ENCOUNTER — Encounter: Payer: Self-pay | Admitting: Cardiology

## 2012-08-13 VITALS — BP 108/72 | HR 60 | Ht 65.0 in | Wt 163.0 lb

## 2012-08-13 DIAGNOSIS — I5022 Chronic systolic (congestive) heart failure: Secondary | ICD-10-CM

## 2012-08-13 DIAGNOSIS — I4891 Unspecified atrial fibrillation: Secondary | ICD-10-CM

## 2012-08-13 DIAGNOSIS — Z7901 Long term (current) use of anticoagulants: Secondary | ICD-10-CM

## 2012-08-13 DIAGNOSIS — I428 Other cardiomyopathies: Secondary | ICD-10-CM

## 2012-08-13 NOTE — Assessment & Plan Note (Signed)
Her last ejection fraction on repeat echocardiogram recently showed a significant decline in from 30-35% by catheterization last year to around 15% this year. This is a nonischemic cardiomyopathy as diagnosed by cardiac catheterization in January 2013. She is on maximum medical therapy.  Despite being 79 years of age, she is fairly frail and high risk for adverse events including acute on chronic systolic heart failure which would require hospitalization, ventricular arrhythmia with increased risk of sudden cardiac death, or stroke. I've had a long talk with her today about this. I've advised her talk her daughters about end-of-life measures. She currently has a living will is suggested may be that she does not want to be resuscitated if there is no chance of full recovery.  At this point I have referred her to [facility]  Care management. I've encouraged to consultation for them to talk to her and her daughters about end-of-life measures. I'll hold off on recommending a defibrillator at this time. I'll see her back in 3 months.  She weighs herself daily. If 3 or more pounds weight gain, told to take extra Lasix every morning until weight back to baseline. She will also call the office. All questions were answered.

## 2012-08-13 NOTE — Patient Instructions (Addendum)
Your physician recommends that you schedule a follow-up appointment in: 3 MONTHS WITH TW  CONTINUE TO WEIGH DAILY, IF YOU NOTE A 3 POUND WEIGHT GAIN CALL THE OFFICE, TAKE EXTRA LASIX TABLET UNTIL WEIGHT RETURNS TO NORMAL BASELINE  AVOID ALL SALT, INCLUDING PROCESSED/PACKAGED/CANNED FOODS, USE ONLY FRESH FRUITS AND VEGETABLES OR CAN USE FROZEN FOODS  YOUR PHYSICIAN RECOMMENDS THAT YOU ARE SET UP WITH [facility], SOMEONE WILL CALL YOU ABOUT THE BEST TIME AVAILABLE TO HAVE A IN HOME VISIT TO EVALUATE YOUR NEEDS. MD WALL CONTACTED [facility] TO HAVE THIS SET UP FOR YOU ASAP  PLEASE DISCUSS END OF LIFE CONCERNS WITH YOUR FAMILY

## 2012-08-13 NOTE — Progress Notes (Signed)
HPI Marissa [name] returns today after being hospitalized with acute on chronic systolic heart failure. Her weight went up 8 pounds for no obvious reason after I saw her recently in the office.   She has a nonischemic cardiomyopathy diagnosed by cardiac catheterization in January 2013. She is on maximal medical therapy. I added spironolactone her last visit.  Since discharge her weight has been stable. She denies orthopnea, PND or edema. She is very weak and fatigued. She still lives alone. She denies any recurrent falls. She's very compliant with her meds.    Past Medical History  Diagnosis Date  . Edema   . Atrial fibrillation     A.  Chronic Coumadin  . Neoplasm     unspecifide nature digestive system  . MVP (mitral valve prolapse)   . Hip fracture     left  . Hypertension   . Nonischemic cardiomyopathy     A.  07/23/11 - Echo: EF 35%;  B. 07/24/2011 - Cath: nonobs dzs ef 35%, 2+MR   . Systolic CHF, chronic     A. Diag 07/2011, EF 35%  . Falls frequently     Current Outpatient Prescriptions  Medication Sig Dispense Refill  . alendronate (FOSAMAX) 70 MG tablet Take 70 mg by mouth every 7 (seven) days. Take with a full glass of water on an empty stomach. Takes on Saturday      . atorvastatin (LIPITOR) 10 MG tablet Take 10 mg by mouth daily.      . cephALEXin (KEFLEX) 250 MG capsule Take 1 capsule (250 mg total) by mouth every 8 (eight) hours.  18 capsule  0  . Cholecalciferol (VITAMIN D) 2000 UNITS CAPS Take 2,000 Units by mouth daily.      . digoxin (LANOXIN) 0.125 MG tablet Take 0.125 mg by mouth daily.      . folic acid (FOLVITE) 1 MG tablet Take 1 mg by mouth daily.      . furosemide (LASIX) 40 MG tablet Take 1 tablet (40 mg total) by mouth 2 (two) times daily.  60 tablet  12  . gabapentin (NEURONTIN) 300 MG capsule Take 300 mg by mouth at bedtime.      Marland Kitchen HYDROcodone-acetaminophen (VICODIN) 5-500 MG per tablet Take 1 tablet by mouth daily.       . hydroxychloroquine (PLAQUENIL)  200 MG tablet Take 1 tablet by mouth Twice daily.      . Hypromellose (GENTEAL OP) Place 1 drop into both eyes daily as needed. Tired Eyes      . losartan (COZAAR) 50 MG tablet Take 50 mg by mouth daily.      . metoprolol succinate (TOPROL-XL) 50 MG 24 hr tablet Take 25 mg by mouth daily. Take with or immediately following a meal.      . potassium chloride (K-DUR) 10 MEQ tablet Take 2 tablets (20 mEq total) by mouth 2 (two) times daily.  60 tablet  12  . predniSONE (DELTASONE) 5 MG tablet Take 5 mg by mouth daily.      Marland Kitchen spironolactone (ALDACTONE) 25 MG tablet Take 1 tablet (25 mg total) by mouth daily.  30 tablet  5  . warfarin (COUMADIN) 5 MG tablet Take 2.5-5 mg by mouth daily. 2.5mg  on Mondays and Thursdays.  5mg  on all other days of the week.        Allergies  Allergen Reactions  . Codeine Other (See Comments)    Makes patient sleepy.  Marland Kitchen Penicillins Rash    No family  history on file.  History   Social History  . Marital Status: Widowed    Spouse Name: N/A    Number of Children: N/A  . Years of Education: N/A   Occupational History  . retired    Social History Main Topics  . Smoking status: Never Smoker   . Smokeless tobacco: Never Used  . Alcohol Use: Yes     Comment: rare  . Drug Use: No  . Sexually Active: Not on file   Other Topics Concern  . Not on file   Social History Narrative  . No narrative on file    ROS ALL NEGATIVE EXCEPT THOSE NOTED IN HPI  PE  General Appearance: well developed, well nourished in no acute distress, frail, HEENT: symmetrical face, PERRLA, good dentition  Neck: no JVD, thyromegaly, or adenopathy, trachea midline Chest: symmetric without deformity Cardiac: PMI non-displaced, irregular rate and rhythm, normal S1, S2, no gallop or murmur Lung: clear to ausculation and percussion Vascular: all pulses full without bruits  Abdominal: nondistended, nontender, good bowel sounds, no HSM, no bruits Extremities: no cyanosis, clubbing or  edema, no sign of DVT, no varicosities, ecchymoses  Skin: normal color, no rashes Neuro: alert and oriented x 3, non-focal Pysch: Depressed affect  EKG  BMET    Component Value Date/Time   NA 144 08/06/2012 0444   K 4.1 08/06/2012 0444   CL 102 08/06/2012 0444   CO2 33* 08/06/2012 0444   GLUCOSE 100* 08/06/2012 0444   BUN 24* 08/06/2012 0444   CREATININE 1.22* 08/06/2012 0444   CREATININE 1.04 11/09/2010 1227   CALCIUM 10.2 08/06/2012 0444   GFRNONAA 42* 08/06/2012 0444   GFRAA 48* 08/06/2012 0444    Lipid Panel     Component Value Date/Time   CHOL 132 07/23/2011 0346   TRIG 83 07/23/2011 0346   HDL 49 07/23/2011 0346   CHOLHDL 2.7 07/23/2011 0346   VLDL 17 07/23/2011 0346   LDLCALC 66 07/23/2011 0346    CBC    Component Value Date/Time   WBC 8.7 08/03/2012 0840   RBC 4.69 08/03/2012 0840   HGB 13.0 08/03/2012 0840   HCT 41.3 08/03/2012 0840   PLT 211 08/03/2012 0840   MCV 88.1 08/03/2012 0840   MCH 27.7 08/03/2012 0840   MCHC 31.5 08/03/2012 0840   RDW 16.1* 08/03/2012 0840   LYMPHSABS 1.5 08/03/2012 0840   MONOABS 0.8 08/03/2012 0840   EOSABS 0.3 08/03/2012 0840   BASOSABS 0.1 08/03/2012 0840

## 2012-08-14 ENCOUNTER — Telehealth: Payer: Self-pay | Admitting: Cardiology

## 2012-08-14 NOTE — Telephone Encounter (Signed)
States that patient told her yesterday that at her visit with Dr. Daleen Squibb told patient that "she needed to get her stuff in order".  Patient's daughter wants to know what "stuff" she needs to get together.  And what was said at appointment. / tgs

## 2012-08-15 NOTE — Telephone Encounter (Signed)
Spoke with Dr Daleen Squibb, who states he will discuss recommendations with the daughter today. Notified her to expect a call from him today.

## 2012-08-16 ENCOUNTER — Telehealth: Payer: Self-pay | Admitting: Cardiology

## 2012-08-16 NOTE — Telephone Encounter (Signed)
PT DAUGHTER STATES SHE IS STILL WAITING ON DR WALL TO CALL HER ABOUT THE VISIT THIS WEEK WITH PT. SHE IS JUST REMINDING HIM.

## 2012-08-16 NOTE — Telephone Encounter (Signed)
Sent a reminder message to Dr Daleen Squibb and he responded that he called and left a message for a return call this am.

## 2012-08-20 ENCOUNTER — Other Ambulatory Visit: Payer: Self-pay | Admitting: *Deleted

## 2012-08-20 MED ORDER — METOPROLOL SUCCINATE ER 25 MG PO TB24: 25.0000 mg | ORAL_TABLET | Freq: Every day | ORAL | Status: DC

## 2012-08-24 ENCOUNTER — Other Ambulatory Visit: Payer: Self-pay | Admitting: Cardiology

## 2012-08-26 ENCOUNTER — Other Ambulatory Visit: Payer: Self-pay | Admitting: *Deleted

## 2012-08-26 MED ORDER — WARFARIN SODIUM 5 MG PO TABS: 2.5000 mg | ORAL_TABLET | Freq: Every day | ORAL | Status: DC

## 2012-08-28 ENCOUNTER — Ambulatory Visit (INDEPENDENT_AMBULATORY_CARE_PROVIDER_SITE_OTHER): Payer: Medicare Other | Admitting: *Deleted

## 2012-08-28 DIAGNOSIS — I4891 Unspecified atrial fibrillation: Secondary | ICD-10-CM

## 2012-08-28 DIAGNOSIS — Z7901 Long term (current) use of anticoagulants: Secondary | ICD-10-CM

## 2012-09-06 ENCOUNTER — Telehealth: Payer: Self-pay | Admitting: Cardiology

## 2012-09-06 MED ORDER — LOSARTAN POTASSIUM 50 MG PO TABS: 50.0000 mg | ORAL_TABLET | Freq: Every day | ORAL | Status: DC

## 2012-09-06 NOTE — Telephone Encounter (Signed)
rx sent to pharmacy by e-script  

## 2012-09-06 NOTE — Telephone Encounter (Signed)
PT NEEDS LOSARTIN CALLED IN TO [facility]

## 2012-09-11 ENCOUNTER — Other Ambulatory Visit: Payer: Self-pay | Admitting: *Deleted

## 2012-09-11 MED ORDER — DIGOXIN 125 MCG PO TABS: 0.1250 mg | ORAL_TABLET | Freq: Every day | ORAL | Status: DC

## 2012-09-18 ENCOUNTER — Ambulatory Visit (INDEPENDENT_AMBULATORY_CARE_PROVIDER_SITE_OTHER): Payer: Medicare Other | Admitting: *Deleted

## 2012-09-18 DIAGNOSIS — I4891 Unspecified atrial fibrillation: Secondary | ICD-10-CM

## 2012-09-18 DIAGNOSIS — Z7901 Long term (current) use of anticoagulants: Secondary | ICD-10-CM

## 2012-09-18 LAB — POCT INR: INR: 2.1

## 2012-09-23 ENCOUNTER — Encounter: Payer: Self-pay | Admitting: Cardiology

## 2012-10-16 ENCOUNTER — Ambulatory Visit (INDEPENDENT_AMBULATORY_CARE_PROVIDER_SITE_OTHER): Payer: Medicare Other | Admitting: *Deleted

## 2012-10-16 DIAGNOSIS — Z7901 Long term (current) use of anticoagulants: Secondary | ICD-10-CM

## 2012-10-16 DIAGNOSIS — I4891 Unspecified atrial fibrillation: Secondary | ICD-10-CM

## 2012-10-16 LAB — POCT INR: INR: 2

## 2012-10-17 ENCOUNTER — Other Ambulatory Visit (HOSPITAL_COMMUNITY): Payer: Self-pay | Admitting: Internal Medicine

## 2012-10-17 DIAGNOSIS — Z139 Encounter for screening, unspecified: Secondary | ICD-10-CM

## 2012-10-24 ENCOUNTER — Ambulatory Visit (HOSPITAL_COMMUNITY)
Admission: RE | Admit: 2012-10-24 | Discharge: 2012-10-24 | Disposition: A | Discharge status: Home / Self Care | Payer: Medicare Other | Source: Ambulatory Visit | Attending: Internal Medicine | Admitting: Internal Medicine

## 2012-10-24 ENCOUNTER — Ambulatory Visit (HOSPITAL_COMMUNITY): Payer: Medicare Other

## 2012-10-24 DIAGNOSIS — Z139 Encounter for screening, unspecified: Secondary | ICD-10-CM

## 2012-10-24 DIAGNOSIS — Z1231 Encounter for screening mammogram for malignant neoplasm of breast: Secondary | ICD-10-CM | POA: Insufficient documentation

## 2012-11-08 ENCOUNTER — Ambulatory Visit (HOSPITAL_COMMUNITY)
Admission: RE | Admit: 2012-11-08 | Discharge: 2012-11-08 | Disposition: A | Discharge status: Home / Self Care | Payer: Medicare Other | Source: Ambulatory Visit | Attending: Dermatology | Admitting: Dermatology

## 2012-11-08 DIAGNOSIS — IMO0001 Reserved for inherently not codable concepts without codable children: Secondary | ICD-10-CM | POA: Insufficient documentation

## 2012-11-08 DIAGNOSIS — S91009A Unspecified open wound, unspecified ankle, initial encounter: Secondary | ICD-10-CM | POA: Insufficient documentation

## 2012-11-08 DIAGNOSIS — S81009A Unspecified open wound, unspecified knee, initial encounter: Secondary | ICD-10-CM | POA: Insufficient documentation

## 2012-11-08 NOTE — Progress Notes (Signed)
Physical Therapy - Wound Therapy  Evaluation   Patient Details  Name: Marissa [name] MRN: [id number] Date of Birth: 05-17-1933 Charge:  eval Today's Date: 11/08/2012 Time: 4098-1191 Time Calculation (min): 45 min  Visit#: 1 of 8  Re-eval: 11/08/12  Subjective Subjective Assessment Subjective: Pt states that she has had a heart attack in January.  She came home and developed  a non-healing wound at the  first of February.  She states that her wound is very painful.  She states it has some bloody drainage but not a lot.  She has been to the MD who gave her Clobetasol propionate which has not helped.   Patient and Family Stated Goals: Pain level to decrease and wound to heal. Date of Onset: 08/11/12 Prior Treatments: As above  Pain Assessment Pain Assessment Pain Assessment: 0-10 Pain Score: 0-No pain (has been as high as an 8/10) Pain Type: Chronic pain Pain Location: Leg Pain Orientation: Right;Posterior Pain Descriptors: Aching Patients Stated Pain Goal: 2  Wound Therapy Wound 11/08/12 Other (Comment) Leg Right;Posterior;Lateral (Active)  % Wound base Red or Granulating 0% 11/08/2012  9:41 AM  % Wound base Black 100% 11/08/2012  9:41 AM  Peri-wound Assessment Edema;Erythema (non-blanchable) 11/08/2012  9:41 AM  Wound Length (cm) 2.7 cm 11/08/2012  9:41 AM  Wound Width (cm) 1.5 cm 11/08/2012  9:41 AM  Margins Attached edges (approximated) 11/08/2012  9:41 AM  Drainage Amount None 11/08/2012  9:41 AM  Drainage Description No odor 11/08/2012  9:41 AM  Non-staged Wound Description Not applicable 11/08/2012  9:41 AM  Treatment Cleansed 11/08/2012  9:41 AM  Dressing Type Compression wrap 11/08/2012  9:41 AM  Dressing Changed New 11/08/2012  9:41 AM  Dressing Status Clean 11/08/2012  9:41 AM     Wound 11/08/12 Other (Comment) Leg Left;Medial slightly iinferior to first wound (Active)  Site / Wound Assessment Black 11/08/2012  9:48 AM  % Wound base Red or Granulating 0% 11/08/2012  9:48 AM  % Wound base  Yellow 0% 11/08/2012  9:48 AM  % Wound base Black 100% 11/08/2012  9:48 AM  Peri-wound Assessment Edema;Erythema (non-blanchable) 11/08/2012  9:48 AM  Wound Length (cm) 2.5 cm 11/08/2012  9:48 AM  Wound Width (cm) 1.8 cm 11/08/2012  9:48 AM  Margins Attached edges (approximated) 11/08/2012  9:48 AM  Drainage Amount None 11/08/2012  9:48 AM  Treatment Cleansed 11/08/2012  9:48 AM  Dressing Type Compression wrap 11/08/2012  9:48 AM  Dressing Changed New 11/08/2012  9:48 AM  Dressing Status Clean 11/08/2012  9:48 AM       Physical Therapy Assessment and Plan Wound Therapy - Assess/Plan/Recommendations Wound Therapy - Clinical Statement: Pt with non-healing wound who will benefit from skilled Pt to make wound enviornment conducive to healing.  LE is swollen; red, and warm. Wound Therapy - Functional Problem List: painful to walk. Factors Delaying/Impairing Wound Healing: Infection - systemic/local;Vascular compromise Hydrotherapy Plan: Debridement;Dressing change;Patient/family education Wound Therapy - Frequency:  (2 x week) Wound Therapy - Current Recommendations: PT Wound Plan: Wound is currently adhesive black eschar.  As wound becomes more moist it may benefit from pusle lavage treatments to remove necrotic tissue.  Pt will be seen 2x week for 4 weeks for wound care.      Goals Wound Therapy Goals - Improve the function of patient's integumentary system by progressing the wound(s) through the phases of wound healing by: Decrease Necrotic Tissue to: 0 Decrease Necrotic Tissue - Progress: Goal set today Increase Granulation Tissue  to: 100 Increase Granulation Tissue - Progress: Goal set today Decrease Length/Width/Depth by (cm): 1x1x.2 cm for both Decrease Length/Width/Depth - Progress: Goal set today Improve Drainage Characteristics:  (scant) Improve Drainage Characteristics - Progress: Goal set today Patient/Family will be able to : verbalize proper dressing. Patient/Family Instruction Goal -  Progress: Goal set today Time For Goal Achievement:  (4 weeks) Wound Therapy - Potential for Goals: Good  Problem List Patient Active Problem List   Diagnosis Date Noted  . Laboratory test 08/01/2012  . Aneurysm of unspecified site 11/07/2011  . Nonischemic cardiomyopathy 08/09/2011  . Chronic systolic heart failure 08/09/2011  . Hematoma, nontraumatic, soft tissue 08/03/2011  . Frequent falls 07/26/2011  . Incisional hernia 04/04/2011  . Dysuria-frequency syndrome 11/09/2010  . Chronic anticoagulation 10/07/2010  . Atrial fibrillation 08/26/2009  . EDEMA 08/26/2009  . NEOPLASM UNSPECIFIED NATURE DIGESTIVE SYSTEM 03/06/2008    GP Functional Limitation: Other PT primary Other PT Primary Current Status (U9811): 100 percent impaired, limited or restricted Other PT Primary Goal Status (B1478): At least 1 percent but less than 20 percent impaired, limited or restricted  RUSSELL,CINDY 11/08/2012, 12:08 PM

## 2012-11-11 ENCOUNTER — Ambulatory Visit (HOSPITAL_COMMUNITY)
Admission: RE | Admit: 2012-11-11 | Discharge: 2012-11-11 | Disposition: A | Discharge status: Home / Self Care | Payer: Medicare Other | Source: Ambulatory Visit | Attending: Dermatology | Admitting: Dermatology

## 2012-11-11 NOTE — Progress Notes (Signed)
Physical Therapy - Wound Therapy  Treatment   Patient Details  Name: Marissa [name] MRN: [id number] Date of Birth: 1933-02-09  Today's Date: 11/11/2012 Time: 4098-1191 Time Calculation (min): 25 min Charges: deb <20cm Visit#: 2 of 8  Re-eval: 11/08/12  Subjective Subjective Assessment Subjective: Pt. states she's not running on all cylinders.   States her leg continues to hurt badly, very sensitive to touch.  Pt easily OOB and with fwd bent posture.     Wound Therapy Wound 11/08/12 Other (Comment) Leg Right;Posterior;Lateral (Active)  % Wound base Red or Granulating 10% 11/11/2012  9:00 AM  % Wound base Yellow 25% 11/11/2012  9:00 AM  % Wound base Black 65% 11/11/2012  9:00 AM  Peri-wound Assessment Edema;Erythema (blanchable) 11/11/2012  9:00 AM  Wound Length (cm) 2.7 cm 11/08/2012  9:41 AM  Wound Width (cm) 1.5 cm 11/08/2012  9:41 AM  Margins Attached edges (approximated) 11/08/2012  9:41 AM  Drainage Amount Minimal 11/11/2012  9:00 AM  Drainage Description Serosanguineous 11/11/2012  9:00 AM  Non-staged Wound Description Not applicable 11/08/2012  9:41 AM  Treatment Cleansed;Debridement (Selective) 11/11/2012  9:00 AM  Dressing Type Moist to moist with Medihoney Gel ;Compression wrap 11/11/2012  9:00 AM  Dressing Changed New 11/11/2012  9:00 AM  Dressing Status Clean;Dry;Intact 11/11/2012  9:00 AM     Wound 11/08/12 Other (Comment) Leg Left;Medial slightly iinferior to first wound (Active)  Site / Wound Assessment Black 11/11/2012  9:00 AM  % Wound base Red or Granulating 0% 11/11/2012  9:00 AM  % Wound base Yellow 10% 11/11/2012  9:00 AM  % Wound base Black 90% 11/11/2012  9:00 AM  Peri-wound Assessment Edema;Erythema (blanchable) 11/11/2012  9:00 AM  Wound Length (cm) 2.5 cm 11/08/2012  9:48 AM  Wound Width (cm) 1.8 cm 11/08/2012  9:48 AM  Margins Attached edges (approximated) 11/08/2012  9:48 AM  Drainage Amount Minimal 11/11/2012  9:00 AM  Drainage Description Serosanguineous 11/11/2012  9:00 AM  Treatment  Cleansed;Debridement (Selective) 11/11/2012  9:00 AM  Dressing Type Moist to moist with Medihoney gel ;Compression wrap 11/11/2012  9:00 AM  Dressing Changed New 11/11/2012  9:00 AM  Dressing Status Clean;Dry;Intact 11/11/2012  9:00 AM       Physical Therapy Assessment and Plan Wound Therapy - Assess/Plan/Recommendations Wound Therapy - Clinical Statement: Most of black eschar debrided from most medial wound.  Lateral wound with slough at edges, difficulty debriding due to sensitivity/pain.  Discussed getting cardiac rehab with patient and self care of wound of home.   Wound Plan: Wound is currently adhesive black eschar.  As wound becomes more moist it may benefit from pusle lavage treatments to remove necrotic tissue.  Pt will be seen 2x week for 4 weeks for wound care.       Problem List Patient Active Problem List   Diagnosis Date Noted  . Laboratory test 08/01/2012  . Aneurysm of unspecified site 11/07/2011  . Nonischemic cardiomyopathy 08/09/2011  . Chronic systolic heart failure 08/09/2011  . Hematoma, nontraumatic, soft tissue 08/03/2011  . Frequent falls 07/26/2011  . Incisional hernia 04/04/2011  . Dysuria-frequency syndrome 11/09/2010  . Chronic anticoagulation 10/07/2010  . Atrial fibrillation 08/26/2009  . EDEMA 08/26/2009  . NEOPLASM UNSPECIFIED NATURE DIGESTIVE SYSTEM 03/06/2008     Lurena Nida, PTA/CLT 11/11/2012, 10:07 AM

## 2012-11-13 ENCOUNTER — Ambulatory Visit (INDEPENDENT_AMBULATORY_CARE_PROVIDER_SITE_OTHER): Payer: Medicare Other | Admitting: *Deleted

## 2012-11-13 DIAGNOSIS — I4891 Unspecified atrial fibrillation: Secondary | ICD-10-CM

## 2012-11-13 DIAGNOSIS — Z7901 Long term (current) use of anticoagulants: Secondary | ICD-10-CM

## 2012-11-13 LAB — POCT INR: INR: 2.8

## 2012-11-14 ENCOUNTER — Ambulatory Visit (HOSPITAL_COMMUNITY)
Admission: RE | Admit: 2012-11-14 | Discharge: 2012-11-14 | Disposition: A | Discharge status: Home / Self Care | Payer: Medicare Other | Source: Ambulatory Visit | Attending: Internal Medicine | Admitting: Internal Medicine

## 2012-11-14 NOTE — Progress Notes (Signed)
Physical Therapy - Wound Therapy  Treatment   Patient Details  Name: Marissa [name] MRN: [id number] Date of Birth: September 28, 1932  Today's Date: 11/14/2012 Time: 7829-5621 Time Calculation (min): 30 min Charges: Selective debridement (= or < 20 cm)   Visit#: 3 of 8  Re-eval: 11/08/12  Subjective Subjective Assessment Subjective: Pt states that she was having 10/10 pain in her leg last nigth because compression wrap was so tight.  Pain Assessment Pain Assessment Pain Score:   8 Pain Location: Leg Pain Orientation: Right;Posterior  Wound Therapy Wound 11/08/12 Other (Comment) Leg Right;Posterior;Lateral (Active)  % Wound base Red or Granulating 10% 11/14/2012  1:23 PM  % Wound base Yellow 60% 11/14/2012  1:23 PM  % Wound base Black 30% 11/14/2012  1:23 PM  Peri-wound Assessment Edema;Erythema (blanchable) 11/14/2012  1:23 PM  Wound Length (cm) 2.7 cm 11/08/2012  9:41 AM  Wound Width (cm) 1.5 cm 11/08/2012  9:41 AM  Margins Attached edges (approximated) 11/08/2012  9:41 AM  Drainage Amount Minimal 11/14/2012  1:23 PM  Drainage Description Serosanguineous 11/14/2012  1:23 PM  Non-staged Wound Description Not applicable 11/08/2012  9:41 AM  Treatment Cleansed;Debridement (Selective) 11/14/2012  1:23 PM  Dressing Type Moist to moist;Compression wrap 11/14/2012  1:23 PM  Dressing Changed New 11/14/2012  1:23 PM  Dressing Status Clean;Dry;Intact 11/14/2012  1:23 PM     Wound 11/08/12 Other (Comment) Leg Left;Medial slightly iinferior to first wound (Active)  Site / Wound Assessment Black 11/14/2012  1:23 PM  % Wound base Red or Granulating 0% 11/14/2012  1:23 PM  % Wound base Yellow 40% 11/14/2012  1:23 PM  % Wound base Black 60% 11/14/2012  1:23 PM  Peri-wound Assessment Edema;Erythema (blanchable) 11/14/2012  1:23 PM  Wound Length (cm) 2.5 cm 11/08/2012  9:48 AM  Wound Width (cm) 1.8 cm 11/08/2012  9:48 AM  Margins Attached edges (approximated) 11/08/2012  9:48 AM  Drainage Amount Minimal 11/14/2012  1:23 PM  Drainage  Description Serosanguineous 11/14/2012  1:23 PM  Treatment Cleansed;Debridement (Selective) 11/14/2012  1:23 PM  Dressing Type Moist to moist;Compression wrap 11/14/2012  1:23 PM  Dressing Changed New 11/14/2012  1:23 PM  Dressing Status Clean;Dry;Intact 11/14/2012  1:23 PM       Physical Therapy Assessment and Plan Wound Therapy - Assess/Plan/Recommendations Wound Therapy - Clinical Statement: Wounds appear to be responding well to medihoney. Able to remove a good amount from most lateral wound. Both wound remain extremely sensitive which hinders debridement. Compression wrap applied more loosely. Asked pt to bring compression garment for RLE next session.  Wound Plan: Continue wound care per PT POC. Attempt taping bandage and applying t's compression garment rather than wrap next session.  Problem List Patient Active Problem List   Diagnosis Date Noted  . Laboratory test 08/01/2012  . Aneurysm of unspecified site 11/07/2011  . Nonischemic cardiomyopathy 08/09/2011  . Chronic systolic heart failure 08/09/2011  . Hematoma, nontraumatic, soft tissue 08/03/2011  . Frequent falls 07/26/2011  . Incisional hernia 04/04/2011  . Dysuria-frequency syndrome 11/09/2010  . Chronic anticoagulation 10/07/2010  . Atrial fibrillation 08/26/2009  . EDEMA 08/26/2009  . NEOPLASM UNSPECIFIED NATURE DIGESTIVE SYSTEM 03/06/2008    Seth Bake, PTA 11/14/2012, 1:30 PM

## 2012-11-18 ENCOUNTER — Ambulatory Visit (HOSPITAL_COMMUNITY)
Admission: RE | Admit: 2012-11-18 | Discharge: 2012-11-18 | Disposition: A | Discharge status: Home / Self Care | Payer: Medicare Other | Source: Ambulatory Visit | Attending: Dermatology | Admitting: Dermatology

## 2012-11-18 NOTE — Progress Notes (Signed)
Physical Therapy - Wound Therapy  Treatment   Patient Details  Name: Marissa [name] MRN: [id number] Date of Birth: Jan 03, 1933  Today's Date: 11/18/2012 Time: 4098-1191 Time Calculation (min): 35 min  Visit#: 4 of 8  Re-eval: 11/08/12 Charges: Selective debridement (= or < 20 cm)   Subjective Subjective Assessment Subjective: Pt states that compression wrap was more tolerable this time.  Pain Assessment Pain Assessment Pain Score:   7 Pain Location: Leg Pain Orientation: Right;Posterior  Wound Therapy Wound 11/08/12 Other (Comment) Leg Right;Posterior;Lateral (Active)  % Wound base Red or Granulating 15% 11/18/2012  9:32 AM  % Wound base Yellow 65% 11/18/2012  9:32 AM  % Wound base Black 20% 11/18/2012  9:32 AM  Peri-wound Assessment Edema;Erythema (blanchable) 11/18/2012  9:32 AM  Wound Length (cm) 2.7 cm 11/08/2012  9:41 AM  Wound Width (cm) 1.5 cm 11/08/2012  9:41 AM  Margins Attached edges (approximated) 11/08/2012  9:41 AM  Drainage Amount Minimal 11/18/2012  9:32 AM  Drainage Description Serosanguineous 11/18/2012  9:32 AM  Non-staged Wound Description Not applicable 11/08/2012  9:41 AM  Treatment Cleansed;Debridement (Selective) 11/18/2012  9:32 AM  Dressing Type Moist to moist;Compression wrap 11/18/2012  9:32 AM  Dressing Changed New 11/18/2012  9:32 AM  Dressing Status Clean;Dry;Intact 11/18/2012  9:32 AM     Wound 11/08/12 Other (Comment) Leg Left;Medial slightly iinferior to first wound (Active)  Site / Wound Assessment Black 11/18/2012  9:32 AM  % Wound base Red or Granulating 5% 11/18/2012  9:32 AM  % Wound base Yellow 55% 11/18/2012  9:32 AM  % Wound base Black 40% 11/18/2012  9:32 AM  Peri-wound Assessment Edema;Erythema (blanchable) 11/18/2012  9:32 AM  Wound Length (cm) 2.5 cm 11/08/2012  9:48 AM  Wound Width (cm) 1.8 cm 11/08/2012  9:48 AM  Margins Attached edges (approximated) 11/08/2012  9:48 AM  Drainage Amount Minimal 11/18/2012  9:32 AM  Drainage Description  Serosanguineous 11/18/2012  9:32 AM  Treatment Cleansed;Debridement (Selective) 11/18/2012  9:32 AM  Dressing Type Moist to moist;Compression wrap 11/18/2012  9:32 AM  Dressing Changed New 11/18/2012  9:32 AM  Dressing Status Clean;Dry;Intact 11/18/2012  9:32 AM    Physical Therapy Assessment and Plan Wound Therapy - Assess/Plan/Recommendations Wound Therapy - Clinical Statement: Wounds are progressing well. Decreased eschar noted in both wounds. Necrotic tissue is less adherent but debridement continues to be limited by pain and sensitivity. Erythema is present but no odor. Pt did not bring compression stocking as she is afraid that she won't be able to don and doff it without affecting the dressing. Wound Plan: Continue wound care per PT POC.  Problem List Patient Active Problem List   Diagnosis Date Noted  . Laboratory test 08/01/2012  . Aneurysm of unspecified site 11/07/2011  . Nonischemic cardiomyopathy 08/09/2011  . Chronic systolic heart failure 08/09/2011  . Hematoma, nontraumatic, soft tissue 08/03/2011  . Frequent falls 07/26/2011  . Incisional hernia 04/04/2011  . Dysuria-frequency syndrome 11/09/2010  . Chronic anticoagulation 10/07/2010  . Atrial fibrillation 08/26/2009  . EDEMA 08/26/2009  . NEOPLASM UNSPECIFIED NATURE DIGESTIVE SYSTEM 03/06/2008    Seth Bake, PTA  11/18/2012, 9:39 AM

## 2012-11-19 ENCOUNTER — Ambulatory Visit (INDEPENDENT_AMBULATORY_CARE_PROVIDER_SITE_OTHER): Payer: Medicare Other | Admitting: Cardiology

## 2012-11-19 ENCOUNTER — Encounter: Payer: Self-pay | Admitting: Cardiology

## 2012-11-19 VITALS — BP 122/68 | HR 72 | Ht 65.0 in | Wt 175.4 lb

## 2012-11-19 DIAGNOSIS — Z7901 Long term (current) use of anticoagulants: Secondary | ICD-10-CM

## 2012-11-19 DIAGNOSIS — R609 Edema, unspecified: Secondary | ICD-10-CM

## 2012-11-19 DIAGNOSIS — I4891 Unspecified atrial fibrillation: Secondary | ICD-10-CM

## 2012-11-19 DIAGNOSIS — R296 Repeated falls: Secondary | ICD-10-CM

## 2012-11-19 DIAGNOSIS — Z9181 History of falling: Secondary | ICD-10-CM

## 2012-11-19 DIAGNOSIS — I5022 Chronic systolic (congestive) heart failure: Secondary | ICD-10-CM

## 2012-11-19 DIAGNOSIS — I428 Other cardiomyopathies: Secondary | ICD-10-CM

## 2012-11-19 NOTE — Patient Instructions (Addendum)
Your physician wants you to follow-up in: 6 months with Dr. Wyline Mood.  You will receive a reminder letter in the mail two months in advance. If you don't receive a letter, please call our office to schedule the follow-up appointment.

## 2012-11-19 NOTE — Progress Notes (Signed)
HPI [name] returns today for evaluation and management of her nonischemic cardiomyopathy, chronic systolic heart failure, and now chronic A. fib. She has had no recurrent falls. I had [facility] care management get involved with Marissa [name]. Per the patient, this is made all the difference in the world and keep her safely at home. She's also been going to the wound Center with transportation been given by [facility]  She has occasional palpitations. She has atypical chest pain of the right chest.  She denies any bleeding or melena. Her edema has been under good control.  Past Medical History  Diagnosis Date  . Edema   . Atrial fibrillation     A.  Chronic Coumadin  . Neoplasm     unspecifide nature digestive system  . MVP (mitral valve prolapse)   . Hip fracture     left  . Hypertension   . Nonischemic cardiomyopathy     A.  07/23/11 - Echo: EF 35%;  B. 07/24/2011 - Cath: nonobs dzs ef 35%, 2+MR   . Systolic CHF, chronic     A. Diag 07/2011, EF 35%  . Falls frequently     Current Outpatient Prescriptions  Medication Sig Dispense Refill  . alendronate (FOSAMAX) 70 MG tablet Take 70 mg by mouth every 7 (seven) days. Take with a full glass of water on an empty stomach. Takes on Saturday      . atorvastatin (LIPITOR) 10 MG tablet Take 10 mg by mouth daily.      . Cholecalciferol (VITAMIN D) 2000 UNITS CAPS Take 2,000 Units by mouth daily.      . digoxin (LANOXIN) 0.125 MG tablet Take 1 tablet (0.125 mg total) by mouth daily.  90 tablet  3  . folic acid (FOLVITE) 1 MG tablet Take 1 mg by mouth daily.      . furosemide (LASIX) 40 MG tablet Take 1 tablet (40 mg total) by mouth 2 (two) times daily.  60 tablet  12  . gabapentin (NEURONTIN) 300 MG capsule Take 300 mg by mouth at bedtime.      Marland Kitchen HYDROcodone-acetaminophen (VICODIN) 5-500 MG per tablet Take 1 tablet by mouth daily.       . hydroxychloroquine (PLAQUENIL) 200 MG tablet Take 1 tablet by mouth Twice daily.      . Hypromellose (GENTEAL OP)  Place 1 drop into both eyes daily as needed. Tired Eyes      . losartan (COZAAR) 50 MG tablet Take 1 tablet (50 mg total) by mouth daily.  30 tablet  5  . metoprolol succinate (TOPROL-XL) 50 MG 24 hr tablet TAKE (1/2) TABLET BY MOUTH TWICE DAILY.  30 tablet  6  . potassium chloride (K-DUR) 10 MEQ tablet Take 2 tablets (20 mEq total) by mouth 2 (two) times daily.  60 tablet  12  . predniSONE (DELTASONE) 5 MG tablet Take 5 mg by mouth daily.      Marland Kitchen warfarin (COUMADIN) 5 MG tablet Take 0.5-1 tablets (2.5-5 mg total) by mouth daily. 2.5mg  on Mondays and Thursdays.  5mg  on all other days of the week.  45 tablet  3   No current facility-administered medications for this visit.    Allergies  Allergen Reactions  . Codeine Other (See Comments)    Makes patient sleepy.  Marland Kitchen Penicillins Rash    History reviewed. No pertinent family history.  History   Social History  . Marital Status: Widowed    Spouse Name: N/A    Number of Children:  N/A  . Years of Education: N/A   Occupational History  . retired    Social History Main Topics  . Smoking status: Never Smoker   . Smokeless tobacco: Never Used  . Alcohol Use: Yes     Comment: rare  . Drug Use: No  . Sexually Active: Not on file   Other Topics Concern  . Not on file   Social History Narrative  . No narrative on file    ROS ALL NEGATIVE EXCEPT THOSE NOTED IN HPI  PE  General Appearance: well developed, well nourished in no acute distress HEENT: symmetrical face, PERRLA, good dentition  Neck: no JVD, thyromegaly, or adenopathy, trachea midline Chest: symmetric without deformity Cardiac: PMI non-displaced, irregular rate and rhythm normal S1, S2, no gallop, soft systolic murmur left sternal border Lung: clear to ausculation and percussion Vascular: all pulses full without bruits  Abdominal: nondistended, nontender, good bowel sounds, no HSM, no bruits Extremities: no cyanosis, clubbing or edema, no sign of DVT, no varicosities  , right foot wrapped from the wound Center Skin: normal color, no rashes Neuro: alert and oriented x 3, non-focal Pysch: normal affect  EKG  BMET    Component Value Date/Time   NA 144 08/06/2012 0444   K 4.1 08/06/2012 0444   CL 102 08/06/2012 0444   CO2 33* 08/06/2012 0444   GLUCOSE 100* 08/06/2012 0444   BUN 24* 08/06/2012 0444   CREATININE 1.22* 08/06/2012 0444   CREATININE 1.04 11/09/2010 1227   CALCIUM 10.2 08/06/2012 0444   GFRNONAA 42* 08/06/2012 0444   GFRAA 48* 08/06/2012 0444    Lipid Panel     Component Value Date/Time   CHOL 132 07/23/2011 0346   TRIG 83 07/23/2011 0346   HDL 49 07/23/2011 0346   CHOLHDL 2.7 07/23/2011 0346   VLDL 17 07/23/2011 0346   LDLCALC 66 07/23/2011 0346    CBC    Component Value Date/Time   WBC 8.7 08/03/2012 0840   RBC 4.69 08/03/2012 0840   HGB 13.0 08/03/2012 0840   HCT 41.3 08/03/2012 0840   PLT 211 08/03/2012 0840   MCV 88.1 08/03/2012 0840   MCH 27.7 08/03/2012 0840   MCHC 31.5 08/03/2012 0840   RDW 16.1* 08/03/2012 0840   LYMPHSABS 1.5 08/03/2012 0840   MONOABS 0.8 08/03/2012 0840   EOSABS 0.3 08/03/2012 0840   BASOSABS 0.1 08/03/2012 0840

## 2012-11-19 NOTE — Assessment & Plan Note (Signed)
Continue rate control and anticoagulation for now. She begins to fall again, we'll need to stop Coumadin. Return the office in 6 months.

## 2012-11-19 NOTE — Assessment & Plan Note (Signed)
Stable. No change in medical therapy. 

## 2012-11-21 ENCOUNTER — Ambulatory Visit (HOSPITAL_COMMUNITY)
Admission: RE | Admit: 2012-11-21 | Discharge: 2012-11-21 | Disposition: A | Discharge status: Home / Self Care | Payer: Medicare Other | Source: Ambulatory Visit | Attending: Dermatology | Admitting: Dermatology

## 2012-11-21 NOTE — Progress Notes (Signed)
Physical Therapy - Wound Therapy  Treatment   Patient Details  Name: Marissa [name] MRN: [id number] Date of Birth: Apr 01, 1933  Today's Date: 11/21/2012 Time: 4259-5638 Time Calculation (min): 30 min Charges: Selective debridement (= or < 20 cm)   Visit#: 5 of 8  Re-eval: 11/08/12  Subjective Subjective Assessment Subjective: Pt states that she took the coban off last night secondary to pain.  Pain Assessment Pain Assessment Pain Score:   7 Pain Location: Leg Pain Orientation: Right;Posterior  Wound Therapy Wound 11/08/12 Other (Comment) Leg Right;Posterior;Lateral (Active)  % Wound base Red or Granulating 20% 11/21/2012 12:16 PM  % Wound base Yellow 70% 11/21/2012 12:16 PM  % Wound base Black 10% 11/21/2012 12:16 PM  Peri-wound Assessment Edema;Erythema (blanchable) 11/21/2012 12:16 PM  Wound Length (cm) 2.7 cm 11/08/2012  9:41 AM  Wound Width (cm) 1.5 cm 11/08/2012  9:41 AM  Margins Attached edges (approximated) 11/08/2012  9:41 AM  Drainage Amount Minimal 11/21/2012 12:16 PM  Drainage Description Serosanguineous 11/21/2012 12:16 PM  Non-staged Wound Description Not applicable 11/08/2012  9:41 AM  Treatment Cleansed;Debridement (Selective) 11/21/2012 12:16 PM  Dressing Type Moist to moist;Compression wrap 11/21/2012 12:16 PM  Dressing Changed New 11/21/2012 12:16 PM  Dressing Status Clean;Dry;Intact 11/21/2012 12:16 PM     Wound 11/08/12 Other (Comment) Leg Left;Medial slightly iinferior to first wound (Active)  Site / Wound Assessment Black 11/21/2012 12:16 PM  % Wound base Red or Granulating 10% 11/21/2012 12:16 PM  % Wound base Yellow 60% 11/21/2012 12:16 PM  % Wound base Black 30% 11/21/2012 12:16 PM  Peri-wound Assessment Edema;Erythema (blanchable) 11/21/2012 12:16 PM  Wound Length (cm) 2.5 cm 11/08/2012  9:48 AM  Wound Width (cm) 1.8 cm 11/08/2012  9:48 AM  Margins Attached edges (approximated) 11/08/2012  9:48 AM  Drainage Amount Minimal 11/21/2012 12:16 PM  Drainage Description  Serosanguineous 11/21/2012 12:16 PM  Treatment Cleansed;Debridement (Selective) 11/21/2012 12:16 PM  Dressing Type Moist to moist;Compression wrap 11/21/2012 12:16 PM  Dressing Changed New 11/21/2012 12:16 PM  Dressing Status Clean;Dry;Intact 11/21/2012 12:16 PM     Physical Therapy Assessment and Plan Wound Therapy - Assess/Plan/Recommendations Wound Therapy - Clinical Statement: Wounds continue to progress well. Debridement continues to be limited by pain and sensitivity. Pt brought compression garment with her. Dressing was anchored with medipore and compression garment was put on over it. Wound Plan: Continue wound care per PT POC.  Problem List Patient Active Problem List   Diagnosis Date Noted  . Laboratory test 08/01/2012  . Aneurysm of unspecified site 11/07/2011  . Nonischemic cardiomyopathy 08/09/2011  . Chronic systolic heart failure 08/09/2011  . Hematoma, nontraumatic, soft tissue 08/03/2011  . Frequent falls 07/26/2011  . Incisional hernia 04/04/2011  . Dysuria-frequency syndrome 11/09/2010  . Chronic anticoagulation 10/07/2010  . Atrial fibrillation 08/26/2009  . EDEMA 08/26/2009  . NEOPLASM UNSPECIFIED NATURE DIGESTIVE SYSTEM 03/06/2008    Seth Bake, PTA  11/21/2012, 12:22 PM

## 2012-11-22 ENCOUNTER — Encounter: Payer: Self-pay | Admitting: Cardiology

## 2012-11-25 ENCOUNTER — Ambulatory Visit (HOSPITAL_COMMUNITY)
Admission: RE | Admit: 2012-11-25 | Discharge: 2012-11-25 | Disposition: A | Discharge status: Home / Self Care | Payer: Medicare Other | Source: Ambulatory Visit | Attending: Internal Medicine | Admitting: Internal Medicine

## 2012-11-25 NOTE — Progress Notes (Signed)
Physical Therapy - Wound Therapy  Treatment  charge:  debridement Patient Details  Name: Marissa [name] MRN: [id number] Date of Birth: 1932-07-16  Today's Date: 11/25/2012 Time: 4098-1191 Time Calculation (min): 39 min  Visit#: 6 of 8  Re-eval:    Subjective Subjective Assessment Subjective: Pt states that she had significant increased pain over the weekend. Patient and Family Stated Goals: Pain level to decrease and wound to heal. Date of Onset: 08/11/12 Prior Treatments: self care.  Pain Assessment Pain Assessment Pain Score:   8 Pain Type: Chronic pain Pain Location: Leg Pain Orientation: Right;Posterior Pain Descriptors: Throbbing Patients Stated Pain Goal: 3  Wound Therapy Wound 11/08/12 Other (Comment) Leg Right;Posterior;Lateral (Active)  Site / Wound Assessment Brown;Dusky 11/25/2012  9:41 AM  % Wound base Red or Granulating 5% 11/25/2012  9:41 AM  % Wound base Yellow 85% 11/25/2012  9:41 AM  % Wound base Black 10% 11/25/2012  9:41 AM  Peri-wound Assessment Edema;Erythema (non-blanchable) 11/25/2012  9:41 AM  Wound Length (cm) 2.7 cm 11/08/2012  9:41 AM  Wound Width (cm) 1.5 cm 11/08/2012  9:41 AM  Wound Depth (cm) 0.3 cm 11/25/2012  9:41 AM  Margins Attached edges (approximated) 11/25/2012  9:41 AM  Closure None 11/25/2012  9:41 AM  Drainage Amount Moderate 11/25/2012  9:41 AM  Drainage Description Green;Odor 11/25/2012  9:41 AM  Non-staged Wound Description Full thickness 11/25/2012  9:41 AM  Treatment Hydrotherapy (Pulse lavage);Debridement (Selective) 11/25/2012  9:41 AM  Dressing Type Silver hydrofiber 11/25/2012  9:41 AM  Dressing Changed Changed 11/25/2012  9:41 AM  Dressing Status Clean;Dry 11/25/2012  9:41 AM     Wound 11/08/12 Other (Comment) Leg Left;Medial slightly iinferior to first wound (Active)  Site / Wound Assessment Black 11/25/2012  9:41 AM  % Wound base Red or Granulating 5% 11/25/2012  9:41 AM  % Wound base Yellow 65% 11/25/2012  9:41 AM  % Wound base  Black 30% 11/25/2012  9:41 AM  Peri-wound Assessment Edema;Erythema (non-blanchable) 11/25/2012  9:41 AM  Wound Length (cm) 2.5 cm 11/08/2012  9:48 AM  Wound Width (cm) 1.8 cm 11/08/2012  9:48 AM  Wound Depth (cm) 0.3 cm 11/25/2012  9:41 AM  Margins Attached edges (approximated) 11/25/2012  9:41 AM  Drainage Amount Moderate 11/25/2012  9:41 AM  Drainage Description Green 11/25/2012  9:41 AM  Non-staged Wound Description Full thickness 11/25/2012  9:41 AM  Treatment Cleansed;Hydrotherapy (Pulse lavage);Debridement (Selective) 11/25/2012  9:41 AM  Dressing Type Silver hydrofiber 11/25/2012  9:41 AM  Dressing Changed Changed 11/25/2012  9:41 AM  Dressing Status Clean 11/25/2012  9:41 AM   Hydrotherapy Pulsed lavage therapy - wound location: both wounds Pulsed Lavage with Suction (psi): 4 psi Pulsed Lavage with Suction - Normal Saline Used: 1000 mL Pulsed Lavage Tip: Tip with splash shield Selective Debridement Selective Debridement - Location: edges of wound attempted more but pt was too tender and unable to tolerate. Selective Debridement - Tools Used: Forceps Selective Debridement - Tissue Removed: slough   Physical Therapy Assessment and Plan Wound Therapy - Assess/Plan/Recommendations Wound Therapy - Clinical Statement: Wound is more painful increased green drainage, Let is red all the way down to the heel and 2" superior to wound.  Redness runs to be aligned with malleoli.  Added pulse lavage to treatment and changed dressing to siliver hydrofiber.   MD notified that therapist felt antibiotics were in order.  MD agreed and will call in. Wound Therapy - Functional Problem List: painful to walk. Factors Delaying/Impairing Wound  Healing: Infection - systemic/local;Vascular compromise Hydrotherapy Plan: Debridement;Dressing change;Patient/family education Wound Therapy - Frequency: 3X / week (increased to 3 x a week to make sure infection is under control) Wound Therapy - Current Recommendations:  PT Wound Plan: increased to 3 times a week to make sure infection is under control;added pulse lavage to treatment plan.      Goals Wound Therapy Goals - Improve the function of patient's integumentary system by progressing the wound(s) through the phases of wound healing by: Decrease Necrotic Tissue to: 0 Decrease Necrotic Tissue - Progress: Not met Increase Granulation Tissue to: 100 Increase Granulation Tissue - Progress: Not met Decrease Length/Width/Depth by (cm): 1x1x.2 cm for both Decrease Length/Width/Depth - Progress: Not met Patient/Family will be able to : verbalize proper dressing. Patient/Family Instruction Goal - Progress: Met  Problem List Patient Active Problem List   Diagnosis Date Noted  . Laboratory test 08/01/2012  . Aneurysm of unspecified site 11/07/2011  . Nonischemic cardiomyopathy 08/09/2011  . Chronic systolic heart failure 08/09/2011  . Hematoma, nontraumatic, soft tissue 08/03/2011  . Frequent falls 07/26/2011  . Incisional hernia 04/04/2011  . Dysuria-frequency syndrome 11/09/2010  . Chronic anticoagulation 10/07/2010  . Atrial fibrillation 08/26/2009  . EDEMA 08/26/2009  . NEOPLASM UNSPECIFIED NATURE DIGESTIVE SYSTEM 03/06/2008    GP    RUSSELL,CINDY 11/25/2012, 9:52 AM

## 2012-11-26 ENCOUNTER — Telehealth: Payer: Self-pay | Admitting: Internal Medicine

## 2012-11-26 NOTE — Telephone Encounter (Signed)
Called pt.  Told her to decrease coumadin to 2.5mg  daily till finished with antibiotic.  Rescheduled INR appt for 12/04/12.  Pt verbalized understanding.

## 2012-11-26 NOTE — Telephone Encounter (Signed)
Pt noted the ABT to be sulfamethoxazole-tmpdx ds t, taking the ABT for infection in her wound on her leg, pt is to take ten days, started yesterday, advised pt this will effect her PT/INR, we will advise lisa and call her back, pt understood

## 2012-11-26 NOTE — Telephone Encounter (Signed)
Called pt to clarify the antibiotic she is taking per not in Epic, vm left to call office

## 2012-11-26 NOTE — Telephone Encounter (Signed)
Patient is calling to ask if her antibiotics (which she started yesterday) and her Warfarin are okay to take together?  Please give the patient a call back at:  [phone].

## 2012-11-27 ENCOUNTER — Ambulatory Visit (HOSPITAL_COMMUNITY)
Admission: RE | Admit: 2012-11-27 | Discharge: 2012-11-27 | Disposition: A | Discharge status: Home / Self Care | Payer: Medicare Other | Source: Ambulatory Visit | Attending: Internal Medicine | Admitting: Internal Medicine

## 2012-11-27 NOTE — Progress Notes (Signed)
Physical Therapy - Wound Therapy  Treatment   Patient Details  Name: Marissa [name] MRN: [id number] Date of Birth: December 28, 1932  Today's Date: 11/27/2012 Time: 4098-1191 Time Calculation (min): 30 min Charge selective debridement <20cm  Visit#: 7 of 8  Re-eval: 11/08/12  Subjective Subjective Assessment Subjective: Pt reported Rt leg feeling better, pain scale 6/10  Pain Assessment Pain Assessment Pain Score:   6 Pain Location: Leg Pain Orientation: Right;Posterior  Wound Therapy      11/27/12 0850  Subjective Assessment  Subjective Pt reported Rt leg feeling better, pain scale 6/10  Wound  Date First Assessed/Time First Assessed: 11/08/12 0941   Wound Type: Other (Comment)  Location: Leg  Location Orientation: Right;Posterior;Lateral  Present on Admission: Yes  Site / Wound Assessment Brown;Dusky  % Wound base Red or Granulating 5%  % Wound base Yellow 90%  % Wound base Black 5%  Peri-wound Assessment Edema;Erythema (non-blanchable)  Margins Attached edges (approximated)  Closure None  Drainage Amount Moderate  Drainage Description Green;Odor  Non-staged Wound Description Full thickness  Treatment Cleansed;Debridement (Selective);Hydrotherapy (Pulse lavage)  Dressing Type Silver hydrofiber;ABD;Gauze (Comment);Other (Comment) (Vaseline periwound)  Dressing Changed Changed  Dressing Status Clean;Dry;Intact  Wound  Date First Assessed/Time First Assessed: 11/08/12 0949   Wound Type: Other (Comment)  Location: Leg  Location Orientation: Left;Medial  Wound Description (Comments): slightly iinferior to first wound  Present on Admission: Yes  Site / Wound Assessment Black;Yellow;Dusky  % Wound base Red or Granulating 10%  % Wound base Yellow 60%  % Wound base Black 30%  Peri-wound Assessment Edema;Erythema (non-blanchable)  Margins Attached edges (approximated)  Drainage Amount Moderate  Drainage Description Green;Odor  Non-staged Wound Description Full thickness   Treatment Cleansed;Debridement (Selective)  Dressing Type Silver hydrofiber;ABD;Gauze (Comment);Other (Comment) (vaseline periwound)  Dressing Changed Changed  Dressing Status Clean;Dry;Intact  Hydrotherapy  Pulsed lavage therapy - wound location both wounds  Pulsed Lavage with Suction (psi) 4 psi  Pulsed Lavage with Suction - Normal Saline Used 1000 mL  Pulsed Lavage Tip Tip with splash shield  Selective Debridement  Selective Debridement - Location lateral wound removal of black eschar, pt limited by pain so cleansed both wound but no further debridement  Selective Debridement - Tools Used Forceps;Scalpel  Selective Debridement - Tissue Removed slough and eschar  Wound Therapy - Assess/Plan/Recommendations  Wound Therapy - Clinical Statement Decreased redness overall.  Pt continues to be limited by pain with debridement, tolerated use of scalpel more than forceps.  Able to remove some black eschar lateral wound, too painful to continue debridement.  Continued silver hydroffiber and vaseline applied to periwound with gauze and abd pad applied for drainage.    Hydrotherapy Pulsed lavage therapy - wound location: both wounds Pulsed Lavage with Suction (psi): 4 psi Pulsed Lavage with Suction - Normal Saline Used: 1000 mL Pulsed Lavage Tip: Tip with splash shield Selective Debridement Selective Debridement - Location: lateral wound removal of black eschar, pt limited by pain so cleansed both wound but no further debridement Selective Debridement - Tools Used: Forceps;Scalpel Selective Debridement - Tissue Removed: slough and eschar   Physical Therapy Assessment and Plan Wound Therapy - Assess/Plan/Recommendations Wound Therapy - Clinical Statement: Decreased redness overall.  Pt continues to be limited by pain with debridement, tolerated use of scalpel more than forceps.  Able to remove some black eschar lateral wound, too painful to continue debridement.  Continued silver hydroffiber  and vaseline applied to periwound with gauze and abd pad applied for drainage. Wound Plan: Re-eval next  session.  Continue PLS x 3 times a week to ensure infection under control      Goals    Problem List Patient Active Problem List   Diagnosis Date Noted  . Laboratory test 08/01/2012  . Aneurysm of unspecified site 11/07/2011  . Nonischemic cardiomyopathy 08/09/2011  . Chronic systolic heart failure 08/09/2011  . Hematoma, nontraumatic, soft tissue 08/03/2011  . Frequent falls 07/26/2011  . Incisional hernia 04/04/2011  . Dysuria-frequency syndrome 11/09/2010  . Chronic anticoagulation 10/07/2010  . Atrial fibrillation 08/26/2009  . EDEMA 08/26/2009  . NEOPLASM UNSPECIFIED NATURE DIGESTIVE SYSTEM 03/06/2008    GP    Juel Burrow 11/27/2012, 11:57 AM

## 2012-11-28 ENCOUNTER — Ambulatory Visit (HOSPITAL_COMMUNITY): Payer: Medicare Other | Admitting: Physical Therapy

## 2012-11-29 ENCOUNTER — Ambulatory Visit (HOSPITAL_COMMUNITY)
Admission: RE | Admit: 2012-11-29 | Discharge: 2012-11-29 | Disposition: A | Discharge status: Home / Self Care | Payer: Medicare Other | Source: Ambulatory Visit | Attending: Internal Medicine | Admitting: Internal Medicine

## 2012-11-29 NOTE — Progress Notes (Signed)
Physical Therapy - Wound Therapy  Reassessment   Patient Details  Name: Marissa [name] MRN: [id number] Date of Birth: 24-Jul-1932  Today's Date: 11/29/2012 Time: 0730-0808 Time Calculation (min): 38 min  Visit#: 8 of 8  Re-eval: 12/29/12  Subjective Subjective Assessment Subjective: Pt states that the pain is still bad but is slowly improving.   Patient and Family Stated Goals: Pain level to decrease and wound to heal. Date of Onset: 08/11/12 Prior Treatments: self care.  Pain Assessment Pain Assessment Pain Score:   6 Pain Type: Chronic pain Pain Location: Leg Pain Orientation: Right;Posterior Pain Descriptors: Throbbing  Wound Therapy Wound 11/08/12 Other (Comment) Leg Right;Posterior;Lateral (Active)  Site / Wound Assessment Brown;Dusky 11/29/2012  9:52 AM  % Wound base Red or Granulating 10% 11/29/2012  9:52 AM  % Wound base Yellow 80% 11/29/2012  9:52 AM  % Wound base Black 10% was 100% black  11/29/2012  9:52 AM  Peri-wound Assessment Edema;Erythema (blanchable) 11/29/2012  9:52 AM  Wound Length (cm) 4 cm  Was 2.7 11/29/2012  9:52 AM  Wound Width (cm) 2 cm  Was 1.5 11/29/2012  9:52 AM  Wound Depth (cm)  2 cm was 2 cm  11/29/2012  9:52 AM  Margins Attached edges (approximated) 11/29/2012  9:52 AM  Closure None 11/29/2012  9:52 AM  Drainage Amount Minimal 11/29/2012  9:52 AM  Drainage Description Purulent;No odor 11/29/2012  9:52 AM  Non-staged Wound Description Full thickness 11/29/2012  9:52 AM  Treatment Cleansed;Hydrotherapy (Pulse lavage);Debridement (Selective) 11/29/2012  9:52 AM  Dressing Type Honeycomb;Compression wrap 11/29/2012  9:52 AM  Dressing Changed Changed 11/29/2012  9:52 AM  Dressing Status Clean;Dry 11/29/2012  9:52 AM     Wound 11/08/12 Other (Comment) Leg Left;Medial slightly iinferior to first wound (Active)  Site / Wound Assessment Black;Yellow;Dusky 11/27/2012  8:50 AM  % Wound base Red or Granulating 10% 11/29/2012  9:52 AM  % Wound base Yellow 80%  11/29/2012  9:52 AM  % Wound base Black 10% was 100% 11/29/2012  9:52 AM  Peri-wound Assessment Edema;Erythema (non-blanchable) 11/29/2012  9:52 AM  Wound Length (cm) 3 cm was 2.5 11/29/2012  9:52 AM  Wound Width (cm) 1.8 cm was 1.8 11/29/2012  9:52 AM  Wound Depth (cm) 0.3 cm was .3 11/29/2012  9:52 AM  Margins Attached edges (approximated) 11/29/2012  9:52 AM  Drainage Amount Moderate 11/29/2012  9:52 AM  Drainage Description Purulent;No odor 11/29/2012  9:52 AM  Non-staged Wound Description Full thickness 11/29/2012  9:52 AM  Treatment Hydrotherapy (Pulse lavage);Debridement (Selective) 11/29/2012  9:52 AM  Dressing Type Compression wrap;Honeycomb 11/29/2012  9:52 AM  Dressing Changed Changed 11/29/2012  9:52 AM  Dressing Status Clean 11/29/2012  9:52 AM   Hydrotherapy Pulsed lavage therapy - wound location: both wounds Pulsed Lavage with Suction (psi): 4 psi Pulsed Lavage with Suction - Normal Saline Used: 1000 mL Pulsed Lavage Tip: Tip with splash shield Selective Debridement Selective Debridement - Location: attempted debridment along entire wound bed but patient pain tolerance continues to be high; able to have minimal success.   Selective Debridement - Tools Used: Forceps;Scalpel Selective Debridement - Tissue Removed: slough and eschar   Physical Therapy Assessment and Plan Wound Therapy - Assess/Plan/Recommendations Wound Therapy - Clinical Statement: Wound had deteriorated on 5/19 /2014 but looks better today although pt continues to have increased redness that basically covers the posterior aspect of her calf.  Wound size appears to have increased but Pt wound was totally black initially so unable to  assess depth and integrety of tissue surrounding wound. Therapist feels wound will not expand any further and should slowly begin to heal.   Wound Therapy - Functional Problem List: painful to walk. Factors Delaying/Impairing Wound Healing: Infection - systemic/local;Vascular  compromise Hydrotherapy Plan: Debridement;Dressing change;Patient/family education Wound Plan: May decrease back to two times a week starting next week if wound continues to decrase in drainage and redness of skin surrounding wound decreases.      Goals  Goal to decrease necrotic tissue to 0% progressing Goal to increase granulated tissue to 100% progressing Goal of Pt to be able to verbalize the importance or compression-met Goal of pt to be I in care-progressing  Problem List Patient Active Problem List   Diagnosis Date Noted  . Laboratory test 08/01/2012  . Aneurysm of unspecified site 11/07/2011  . Nonischemic cardiomyopathy 08/09/2011  . Chronic systolic heart failure 08/09/2011  . Hematoma, nontraumatic, soft tissue 08/03/2011  . Frequent falls 07/26/2011  . Incisional hernia 04/04/2011  . Dysuria-frequency syndrome 11/09/2010  . Chronic anticoagulation 10/07/2010  . Atrial fibrillation 08/26/2009  . EDEMA 08/26/2009  . NEOPLASM UNSPECIFIED NATURE DIGESTIVE SYSTEM 03/06/2008    GP Functional Limitation: Other PT primary Other PT Primary Current Status (W0981): At least 80 percent but less than 100 percent impaired, limited or restricted Other PT Primary Goal Status (X9147): At least 1 percent but less than 20 percent impaired, limited or restricted  Kerrilyn Azbill,CINDY 11/29/2012, 10:05 AM

## 2012-12-03 ENCOUNTER — Ambulatory Visit (HOSPITAL_COMMUNITY)
Admission: RE | Admit: 2012-12-03 | Discharge: 2012-12-03 | Disposition: A | Discharge status: Home / Self Care | Payer: Medicare Other | Source: Ambulatory Visit | Attending: Internal Medicine | Admitting: Internal Medicine

## 2012-12-03 NOTE — Progress Notes (Signed)
Physical Therapy - Wound Therapy  Treatment   Patient Details  Name: Marissa [name] MRN: [id number] Date of Birth: [birth year]  Today's Date: 12/03/2012 Time: 4098-1191 Time Calculation (min): 33 min Charges: Debridment > 20 cm Visit#: 9 of 18  Re-eval: 12/29/12  Subjective Subjective Assessment Subjective: Pt reports that her leg is still very sore.  It was very bad all weekend. Reports that her legs felt weak all weekend.   Pain Assessment Pain Assessment Pain Score:   6 (FACES)  Wound Therapy Wound 11/08/12 Other (Comment) Leg Right;Posterior;Lateral (Active)  Site / Wound Assessment Pink;Red;Yellow 12/03/2012  8:43 AM  % Wound base Red or Granulating 10% 12/03/2012  8:43 AM  % Wound base Yellow 90% 12/03/2012  8:43 AM  % Wound base Black 10% 11/29/2012  9:52 AM  Peri-wound Assessment Edema;Erythema (blanchable);Maceration 12/03/2012  8:43 AM  Wound Length (cm) 4 cm 11/29/2012  9:52 AM  Wound Width (cm) 2 cm 11/29/2012  9:52 AM  Wound Depth (cm) 0.2 cm 11/29/2012  9:52 AM  Margins Attached edges (approximated) 11/29/2012  9:52 AM  Closure None 11/29/2012  9:52 AM  Drainage Amount Moderate 12/03/2012  8:43 AM  Drainage Description No odor;Purulent 12/03/2012  8:43 AM  Non-staged Wound Description Full thickness 11/29/2012  9:52 AM  Treatment Cleansed;Hydrotherapy (Pulse lavage);Debridement (Selective) 12/03/2012  8:43 AM  Dressing Type Moist to moist;ABD;Alginate 12/03/2012  8:43 AM  Dressing Changed New 12/03/2012  8:43 AM  Dressing Status Clean;Dry 12/03/2012  8:43 AM     Wound 11/08/12 Other (Comment) Leg Left;Medial slightly iinferior to first wound (Active)  Site / Wound Assessment Yellow;Red;Pink 12/03/2012  8:43 AM  % Wound base Red or Granulating 10% 12/03/2012  8:43 AM  % Wound base Yellow 90% 12/03/2012  8:43 AM  % Wound base Black 10% 11/29/2012  9:52 AM  Peri-wound Assessment Edema;Erythema (non-blanchable) 11/29/2012  9:52 AM  Wound Length (cm) 3 cm 11/29/2012  9:52 AM  Wound  Width (cm) 1.8 cm 11/29/2012  9:52 AM  Wound Depth (cm) 0.3 cm 11/29/2012  9:52 AM  Margins Attached edges (approximated) 11/29/2012  9:52 AM  Drainage Amount Moderate 12/03/2012  8:43 AM  Drainage Description Purulent;No odor 12/03/2012  8:43 AM  Non-staged Wound Description Full thickness 12/03/2012  8:43 AM  Treatment Cleansed;Hydrotherapy (Pulse lavage) 12/03/2012  8:43 AM  Dressing Type Compression wrap;Honeycomb;ABD 12/03/2012  8:43 AM  Dressing Changed New 12/03/2012  8:43 AM  Dressing Status Clean;Dry;Intact 12/03/2012  8:43 AM   Hydrotherapy Pulsed lavage therapy - wound location: both wounds Pulsed Lavage with Suction (psi): 4 psi Pulsed Lavage with Suction - Normal Saline Used: 1000 mL Pulsed Lavage Tip: Tip with splash shield Selective Debridement Selective Debridement - Location: to posterior calf Selective Debridement - Tools Used: Forceps (gauze and q-tip) Selective Debridement - Tissue Removed: slough   Physical Therapy Assessment and Plan Wound Therapy - Assess/Plan/Recommendations Wound Therapy - Clinical Statement: Utilized gentle massage and ice massage before treatmnet to decrease pain and tenderness to region and was able to debrid slough. Continues to have redness to periwound with significant fascial restrictions without dolor.   Appears today without odor or eschar.  Pt continues to have significant pain at end of session.  Recommend to ice leg during work and after work to help decrease pain for 10 minutes and provided pt with exercises for LE strengthening.  Wound Plan: Has moved to 2x/wk       Goals    Problem List Patient Active Problem List  Diagnosis Date Noted  . Laboratory test 08/01/2012  . Aneurysm of unspecified site 11/07/2011  . Nonischemic cardiomyopathy 08/09/2011  . Chronic systolic heart failure 08/09/2011  . Hematoma, nontraumatic, soft tissue 08/03/2011  . Frequent falls 07/26/2011  . Incisional hernia 04/04/2011  . Dysuria-frequency syndrome  11/09/2010  . Chronic anticoagulation 10/07/2010  . Atrial fibrillation 08/26/2009  . EDEMA 08/26/2009  . NEOPLASM UNSPECIFIED NATURE DIGESTIVE SYSTEM 03/06/2008   Ammy Lienhard, MPT, ATC 12/03/2012, 9:59 AM

## 2012-12-04 ENCOUNTER — Ambulatory Visit (INDEPENDENT_AMBULATORY_CARE_PROVIDER_SITE_OTHER): Payer: Medicare Other | Admitting: *Deleted

## 2012-12-04 DIAGNOSIS — Z7901 Long term (current) use of anticoagulants: Secondary | ICD-10-CM

## 2012-12-04 DIAGNOSIS — I4891 Unspecified atrial fibrillation: Secondary | ICD-10-CM

## 2012-12-04 LAB — POCT INR: INR: 4.1

## 2012-12-05 ENCOUNTER — Ambulatory Visit (HOSPITAL_COMMUNITY)
Admission: RE | Admit: 2012-12-05 | Discharge: 2012-12-05 | Disposition: A | Discharge status: Home / Self Care | Payer: Medicare Other | Source: Ambulatory Visit | Attending: Internal Medicine | Admitting: Internal Medicine

## 2012-12-05 NOTE — Progress Notes (Signed)
Physical Therapy - Wound Therapy  Treatment   Patient Details  Name: Marissa [name] MRN: [id number] Date of Birth: August 06, 1932  Today's Date: 12/05/2012 Time: 4098-1191 Time Calculation (min): 47 min Charge: selective debridement <20 cm  Visit#: 10 of 18  Re-eval: 12/29/12  Subjective Subjective Assessment Subjective: Pt reported cleaning compression hose so not wearing today, surprised at how much they help with her swelling.  Rt leg pain scale 2-3/10 today  Pt done with antibiotic, questioned whethere to request more antibiotic.  Wound with overall decreased redness, heat and edema  Pain Assessment Pain Assessment Pain Score:   3 Pain Location: Leg Pain Orientation: Right;Posterior  Wound Therapy  12/05/12 0938  Subjective Assessment  Subjective Pt reported cleaning compression hose so not wearing today, surprised at how much they help with her swelling.  Rt leg pain scale 2-3/10 today  Pt done with antibiotic, questioned whethere to request more antibiotic.  Wound with overall decreased redness, heat and edema  Wound  Date First Assessed/Time First Assessed: 11/08/12 0941   Wound Type: Other (Comment)  Location: Leg  Location Orientation: Right;Posterior;Lateral  Present on Admission: Yes  Site / Wound Assessment Pink;Red;Yellow  % Wound base Red or Granulating 30%  % Wound base Yellow 70%  % Wound base Black 0%  Drainage Amount Moderate  Drainage Description No odor;Purulent  Treatment Cleansed;Debridement (Selective);Hydrotherapy (Pulse lavage)  Dressing Type Moist to moist;ABD;Alginate  Dressing Changed New  Dressing Status Clean;Dry  Wound  Date First Assessed/Time First Assessed: 11/08/12 0949   Wound Type: Other (Comment)  Location: Leg  Location Orientation: Left;Medial  Wound Description (Comments): slightly iinferior to first wound  Present on Admission: Yes  Site / Wound Assessment Yellow;Red;Pink  % Wound base Red or Granulating 30%  % Wound base Yellow 70%   Drainage Amount Moderate  Drainage Description Purulent;No odor  Non-staged Wound Description Full thickness  Treatment Cleansed;Debridement (Selective);Hydrotherapy (Pulse lavage)  Dressing Type Compression wrap;Honeycomb;ABD (Honey Aginate)  Dressing Changed New  Dressing Status Clean;Dry;Intact  Hydrotherapy  Pulsed lavage therapy - wound location both wounds  Pulsed Lavage with Suction (psi) 4 psi  Pulsed Lavage with Suction - Normal Saline Used 1000 mL  Pulsed Lavage Tip Tip with splash shield  Selective Debridement  Selective Debridement - Location to posterior calf  Selective Debridement - Tools Used Forceps;Scalpel (qtips and gauze)  Selective Debridement - Tissue Removed slough  Wound Therapy - Assess/Plan/Recommendations  Wound Therapy - Clinical Statement Continued with gentle massage and ice prior debridement to assist with desensitization.  Able to remove significant amount of yellow slough with PLS this session.  Pt limited by pain so did not complete much debridement, was able to remove yellow slough with scalpel.  Continued wtih Honey alginate, ABD pad for drainnage and gauze dressings.  Wound overall decrease in redness, heat and edema.  Wound Therapy - Functional Problem List painful to walk.  Factors Delaying/Impairing Wound Healing Infection - systemic/local;Vascular compromise  Hydrotherapy Plan Debridement;Dressing change;Patient/family education  Wound Plan Continue with current POC    Hydrotherapy Pulsed lavage therapy - wound location: both wounds Pulsed Lavage with Suction (psi): 4 psi Pulsed Lavage with Suction - Normal Saline Used: 1000 mL Pulsed Lavage Tip: Tip with splash shield Selective Debridement Selective Debridement - Location: to posterior calf Selective Debridement - Tools Used: Forceps;Scalpel (qtips and gauze) Selective Debridement - Tissue Removed: slough   Physical Therapy Assessment and Plan Wound Therapy -  Assess/Plan/Recommendations Wound Therapy - Clinical Statement: Continued with gentle massage  and ice prior debridement to assist with desensitization.  Able to remove significant amount of yellow slough with PLS this session.  Pt limited by pain so did not complete much debridement, was able to remove yellow slough with scalpel.  Continued wtih Honey alginate, ABD pad for drainnage and gauze dressings.  Wound overall decrease in redness, heat and edema. Wound Therapy - Functional Problem List: painful to walk. Factors Delaying/Impairing Wound Healing: Infection - systemic/local;Vascular compromise Hydrotherapy Plan: Debridement;Dressing change;Patient/family education Wound Plan: Continue with current POC      Goals    Problem List Patient Active Problem List   Diagnosis Date Noted  . Laboratory test 08/01/2012  . Aneurysm of unspecified site 11/07/2011  . Nonischemic cardiomyopathy 08/09/2011  . Chronic systolic heart failure 08/09/2011  . Hematoma, nontraumatic, soft tissue 08/03/2011  . Frequent falls 07/26/2011  . Incisional hernia 04/04/2011  . Dysuria-frequency syndrome 11/09/2010  . Chronic anticoagulation 10/07/2010  . Atrial fibrillation 08/26/2009  . EDEMA 08/26/2009  . NEOPLASM UNSPECIFIED NATURE DIGESTIVE SYSTEM 03/06/2008    GP    Juel Burrow 12/05/2012, 9:47 AM

## 2012-12-08 DIAGNOSIS — I214 Non-ST elevation (NSTEMI) myocardial infarction: Secondary | ICD-10-CM

## 2012-12-08 DIAGNOSIS — I2542 Coronary artery dissection: Secondary | ICD-10-CM

## 2012-12-08 HISTORY — DX: Non-ST elevation (NSTEMI) myocardial infarction: I21.4

## 2012-12-08 HISTORY — DX: Coronary artery dissection: I25.42

## 2012-12-09 ENCOUNTER — Ambulatory Visit (HOSPITAL_COMMUNITY)
Admission: RE | Admit: 2012-12-09 | Discharge: 2012-12-09 | Disposition: A | Discharge status: Home / Self Care | Payer: Medicare Other | Source: Ambulatory Visit | Attending: Internal Medicine | Admitting: Internal Medicine

## 2012-12-09 DIAGNOSIS — S81009A Unspecified open wound, unspecified knee, initial encounter: Secondary | ICD-10-CM | POA: Insufficient documentation

## 2012-12-09 DIAGNOSIS — IMO0001 Reserved for inherently not codable concepts without codable children: Secondary | ICD-10-CM | POA: Insufficient documentation

## 2012-12-09 NOTE — Progress Notes (Signed)
Physical Therapy - Wound Therapy  Treatment   Patient Details  Name: Marissa [name] MRN: [id number] Date of Birth: 1933/05/13  Today's Date: 12/09/2012 Time: 4098-1191 Time Calculation (min): 33 min Charge: selective debridement <20cm  Visit#: 11 of 18  Re-eval: 12/29/12  Subjective Subjective Assessment Subjective: Pt reported increased Rt leg pain following last session, pain scale 8/10 today  Pain Assessment Pain Assessment Pain Score:   8 Pain Location: Leg Pain Orientation: Posterior;Right  Wound Therapy  12/09/12 0836  Subjective Assessment  Subjective Pt reported increased Rt leg pain following last session, pain scale 8/10 today  Wound  Date First Assessed/Time First Assessed: 11/08/12 0941   Wound Type: Other (Comment)  Location: Leg  Location Orientation: Right;Posterior;Lateral  Present on Admission: Yes  Site / Wound Assessment Pink;Red;Yellow  % Wound base Red or Granulating 35%  % Wound base Yellow 65%  % Wound base Black 0%  Drainage Amount Moderate  Drainage Description No odor;Purulent  Non-staged Wound Description Full thickness  Treatment Cleansed;Debridement (Selective);Hydrotherapy (Pulse lavage)  Dressing Type Moist to moist;ABD;Alginate  Dressing Changed New  Dressing Status Clean;Dry  Wound  Date First Assessed/Time First Assessed: 11/08/12 0949   Wound Type: Other (Comment)  Location: Leg  Location Orientation: Left;Medial  Wound Description (Comments): slightly iinferior to first wound  Present on Admission: Yes  Site / Wound Assessment Yellow;Red;Pink  % Wound base Red or Granulating 30%  % Wound base Yellow 70%  % Wound base Black 0%  Drainage Amount Moderate  Drainage Description Purulent;No odor  Non-staged Wound Description Full thickness  Treatment Cleansed;Debridement (Selective);Hydrotherapy (Pulse lavage)  Dressing Type Honeycomb;ABD;Gauze (Comment) (Medihoney antalgi)  Dressing Changed New  Dressing Status Clean;Dry;Intact   Hydrotherapy  Pulsed lavage therapy - wound location both wounds  Pulsed Lavage with Suction (psi) 4 psi  Pulsed Lavage with Suction - Normal Saline Used 1000 mL  Pulsed Lavage Tip Tip with splash shield  Selective Debridement  Selective Debridement - Location to posterior calf  Selective Debridement - Tools Used Other (comment) (gauze and qtips)  Selective Debridement - Tissue Removed slough  Wound Therapy - Assess/Plan/Recommendations  Wound Therapy - Clinical Statement Pt continues to be limited by pain with debridement.  Able to remove signifiant anount of yellow slough with PLS, debridement for removal of slough complete with gauze and debridement.  Continued with the honey alginate due to positive results previously, gauze dressings with ABD pad for drainage.  Overall reduction of redness around periwound.    Wound Therapy - Functional Problem List painful to walk.  Factors Delaying/Impairing Wound Healing Infection - systemic/local;Vascular compromise  Hydrotherapy Plan Debridement;Dressing change;Patient/family education  Wound Plan Continue with current POC    Hydrotherapy Pulsed lavage therapy - wound location: both wounds Pulsed Lavage with Suction (psi): 4 psi Pulsed Lavage with Suction - Normal Saline Used: 1000 mL Pulsed Lavage Tip: Tip with splash shield Selective Debridement Selective Debridement - Location: to posterior calf Selective Debridement - Tools Used: Other (comment) (gauze and qtips) Selective Debridement - Tissue Removed: slough   Physical Therapy Assessment and Plan Wound Therapy - Assess/Plan/Recommendations Wound Therapy - Clinical Statement: Pt continues to be limited by pain with debridement.  Able to remove signifiant anount of yellow slough with PLS, debridement for removal of slough complete with gauze and debridement.  Continued with the honey alginate due to positive results previously, gauze dressings with ABD pad for drainage.  Overall reduction  of redness around periwound.   Wound Therapy - Functional Problem List:  painful to walk. Factors Delaying/Impairing Wound Healing: Infection - systemic/local;Vascular compromise Hydrotherapy Plan: Debridement;Dressing change;Patient/family education Wound Plan: Continue with current POC      Goals    Problem List Patient Active Problem List   Diagnosis Date Noted  . Laboratory test 08/01/2012  . Aneurysm of unspecified site 11/07/2011  . Nonischemic cardiomyopathy 08/09/2011  . Chronic systolic heart failure 08/09/2011  . Hematoma, nontraumatic, soft tissue 08/03/2011  . Frequent falls 07/26/2011  . Incisional hernia 04/04/2011  . Dysuria-frequency syndrome 11/09/2010  . Chronic anticoagulation 10/07/2010  . Atrial fibrillation 08/26/2009  . EDEMA 08/26/2009  . NEOPLASM UNSPECIFIED NATURE DIGESTIVE SYSTEM 03/06/2008    GP    Juel Burrow 12/09/2012, 8:44 AM

## 2012-12-12 ENCOUNTER — Ambulatory Visit (HOSPITAL_COMMUNITY)
Admission: RE | Admit: 2012-12-12 | Discharge: 2012-12-12 | Disposition: A | Discharge status: Home / Self Care | Payer: Medicare Other | Source: Ambulatory Visit | Attending: Internal Medicine | Admitting: Internal Medicine

## 2012-12-12 NOTE — Progress Notes (Signed)
Physical Therapy - Wound Therapy  Treatment   Patient Details  Name: Marissa [name] MRN: [id number] Date of Birth: March 13, 1933  Today's Date: 12/12/2012 Time: 1030-1100 Time Calculation (min): 30 min Charges:  Deb > 20 cm Visit#: 12 of 18  Re-eval: 12/29/12  Subjective Subjective Assessment Subjective: Pt continues to be limited by her pain, reports that she has not been icing.  Date of Onset: 08/11/12  Pain Assessment Pain Assessment Pain Score:   8  Wound Therapy Wound 11/08/12 Other (Comment) Leg Right;Posterior;Lateral (Active)  Site / Wound Assessment Pink;Red;Yellow 12/12/2012 12:22 PM  % Wound base Red or Granulating 40% 12/12/2012 12:22 PM  % Wound base Yellow 70% 12/12/2012 12:22 PM  % Wound base Black 0% 12/12/2012 12:22 PM  Peri-wound Assessment Edema;Erythema (blanchable);Maceration 12/03/2012  8:43 AM  Wound Length (cm) 4 cm 11/29/2012  9:52 AM  Wound Width (cm) 2 cm 11/29/2012  9:52 AM  Wound Depth (cm) 0.2 cm 11/29/2012  9:52 AM  Margins Attached edges (approximated) 11/29/2012  9:52 AM  Closure None 11/29/2012  9:52 AM  Drainage Amount Moderate 12/12/2012 12:22 PM  Drainage Description No odor;Purulent 12/12/2012 12:22 PM  Non-staged Wound Description Full thickness 12/12/2012 12:22 PM  Treatment Cleansed;Debridement (Selective);Hydrotherapy (Pulse lavage) 12/09/2012  8:36 AM  Dressing Type Moist to moist;ABD;Alginate 12/12/2012 12:22 PM  Dressing Changed New 12/09/2012  8:36 AM  Dressing Status Clean;Dry 12/12/2012 12:22 PM     Wound 11/08/12 Other (Comment) Leg Left;Medial slightly iinferior to first wound (Active)  Site / Wound Assessment Yellow;Red;Pink 12/12/2012 12:22 PM  % Wound base Red or Granulating 30% 12/12/2012 12:22 PM  % Wound base Yellow 70% 12/12/2012 12:22 PM  % Wound base Black 0% 12/12/2012 12:22 PM  Peri-wound Assessment Edema;Erythema (non-blanchable) 11/29/2012  9:52 AM  Wound Length (cm) 3 cm 11/29/2012  9:52 AM  Wound Width (cm) 1.8 cm 11/29/2012  9:52 AM  Wound  Depth (cm) 0.3 cm 11/29/2012  9:52 AM  Margins Attached edges (approximated) 11/29/2012  9:52 AM  Drainage Amount Moderate 12/12/2012 12:22 PM  Drainage Description Purulent;No odor 12/12/2012 12:22 PM  Non-staged Wound Description Full thickness 12/12/2012 12:22 PM  Treatment Cleansed;Debridement (Selective);Hydrotherapy (Pulse lavage) 12/09/2012  8:36 AM  Dressing Type Honeycomb;ABD;Gauze (Comment) 12/12/2012 12:22 PM  Dressing Changed New 12/09/2012  8:36 AM  Dressing Status Clean;Dry;Intact 12/12/2012 12:22 PM   Selective Debridement Selective Debridement - Location: to posterior calf Selective Debridement - Tools Used: Other (comment) (gauze and qtips) Selective Debridement - Tissue Removed: slough   Physical Therapy Assessment and Plan Wound Therapy - Assess/Plan/Recommendations Wound Therapy - Clinical Statement: Pt reports that pain is her most limiting factor.  Continues to have signficant pain and tenderness with debridment.  did not complete PLSV due to pain.  Continued with manual techniqes to decrease pain to area.  Wound Plan: Contnue with wound care with appropriate dressings.       Goals    Problem List Patient Active Problem List   Diagnosis Date Noted  . Laboratory test 08/01/2012  . Aneurysm of unspecified site 11/07/2011  . Nonischemic cardiomyopathy 08/09/2011  . Chronic systolic heart failure 08/09/2011  . Hematoma, nontraumatic, soft tissue 08/03/2011  . Frequent falls 07/26/2011  . Incisional hernia 04/04/2011  . Dysuria-frequency syndrome 11/09/2010  . Chronic anticoagulation 10/07/2010  . Atrial fibrillation 08/26/2009  . EDEMA 08/26/2009  . NEOPLASM UNSPECIFIED NATURE DIGESTIVE SYSTEM 03/06/2008    GP    Hazley Dezeeuw, MPT, ATC 12/12/2012, 12:28 PM

## 2012-12-16 ENCOUNTER — Ambulatory Visit (HOSPITAL_COMMUNITY)
Admission: RE | Admit: 2012-12-16 | Discharge: 2012-12-16 | Disposition: A | Discharge status: Home / Self Care | Payer: Medicare Other | Source: Ambulatory Visit | Attending: Internal Medicine | Admitting: Internal Medicine

## 2012-12-16 NOTE — Progress Notes (Signed)
Physical Therapy - Wound Therapy  Treatment   Patient Details  Name: Marissa [name] MRN: [id number] Date of Birth: 01/22/1933  Today's Date: 12/16/2012 Time: 7829-5621 Time Calculation (min): 21 min Charges: Deb > 20 cm Visit#: 13 of 18  Re-eval: 12/29/12  Subjective Subjective Assessment Subjective: Continues to be painful, denies using ice.  Took a hydrocodone before therapy.  Using compression stockings.   Pain Assessment Pain Assessment Pain Score:   8 Pain Type: Acute pain Pain Location: Leg  Wound Therapy Wound 11/08/12 Other (Comment) Leg Right;Posterior;Lateral (Active)  Site / Wound Assessment Pink;Red;Yellow 12/16/2012  8:30 AM  % Wound base Red or Granulating 30% 12/16/2012  8:30 AM  % Wound base Yellow 70% 12/16/2012  8:30 AM  % Wound base Black 0% 12/16/2012  8:30 AM  Peri-wound Assessment Edema;Erythema (blanchable);Maceration 12/03/2012  8:43 AM  Wound Length (cm) 4 cm 11/29/2012  9:52 AM  Wound Width (cm) 2 cm 11/29/2012  9:52 AM  Wound Depth (cm) 0.2 cm 11/29/2012  9:52 AM  Margins Attached edges (approximated) 11/29/2012  9:52 AM  Closure None 11/29/2012  9:52 AM  Drainage Amount Moderate 12/16/2012  8:30 AM  Drainage Description No odor;Purulent 12/16/2012  8:30 AM  Non-staged Wound Description Full thickness 12/16/2012  8:30 AM  Treatment Cleansed;Debridement (Selective);Hydrotherapy (Pulse lavage) 12/09/2012  8:36 AM  Dressing Type Moist to moist;ABD;Silver hydrofiber 12/16/2012  8:30 AM  Dressing Changed New 12/09/2012  8:36 AM  Dressing Status Clean;Dry 12/16/2012  8:30 AM     Wound 11/08/12 Other (Comment) Leg Left;Medial slightly iinferior to first wound (Active)  Site / Wound Assessment Yellow;Red;Pink 12/16/2012  8:30 AM  % Wound base Red or Granulating 30% 12/16/2012  8:30 AM  % Wound base Yellow 70% 12/16/2012  8:30 AM  % Wound base Black 0% 12/16/2012  8:30 AM  Peri-wound Assessment Edema;Erythema (non-blanchable) 11/29/2012  9:52 AM  Wound Length (cm) 3 cm 11/29/2012  9:52  AM  Wound Width (cm) 1.8 cm 11/29/2012  9:52 AM  Wound Depth (cm) 0.3 cm 11/29/2012  9:52 AM  Margins Attached edges (approximated) 11/29/2012  9:52 AM  Drainage Amount Moderate 12/16/2012  8:30 AM  Drainage Description Purulent;No odor 12/16/2012  8:30 AM  Non-staged Wound Description Full thickness 12/16/2012  8:30 AM  Treatment Cleansed;Debridement (Selective);Hydrotherapy (Pulse lavage) 12/09/2012  8:36 AM  Dressing Type ABD;Gauze (Comment);Silver hydrofiber 12/16/2012  8:30 AM  Dressing Changed New 12/09/2012  8:36 AM  Dressing Status Clean;Dry;Intact 12/16/2012  8:30 AM   Hydrotherapy Pulsed lavage therapy - wound location: both wounds Pulsed Lavage with Suction (psi): 4 psi Pulsed Lavage with Suction - Normal Saline Used: 1000 mL Pulsed Lavage Tip: Tip with splash shield Selective Debridement Selective Debridement - Location: to posterior calf Selective Debridement - Tools Used: Other (comment) (gauze and qtips) Selective Debridement - Tissue Removed: slough   Physical Therapy Assessment and Plan Wound Therapy - Assess/Plan/Recommendations Wound Therapy - Clinical Statement: Pt continues to be limited by pain.  Continues to have complaints of arterial wound.  Recommed d/c use of compression stockings secondary to slow healing wound.   Wound Plan: f/u on wound without use of compression.       Goals Wound Therapy Goals - Improve the function of patient's integumentary system by progressing the wound(s) through the phases of wound healing by: Decrease Necrotic Tissue to: 0 Decrease Necrotic Tissue - Progress: Not met Increase Granulation Tissue to: 100 Increase Granulation Tissue - Progress: Not met Decrease Length/Width/Depth by (cm): 1x1x.2 cm for  both Decrease Length/Width/Depth - Progress: Not met Patient/Family will be able to : verbalize proper dressing. Patient/Family Instruction Goal - Progress: Not met  Problem List Patient Active Problem List   Diagnosis Date Noted  .  Laboratory test 08/01/2012  . Aneurysm of unspecified site 11/07/2011  . Nonischemic cardiomyopathy 08/09/2011  . Chronic systolic heart failure 08/09/2011  . Hematoma, nontraumatic, soft tissue 08/03/2011  . Frequent falls 07/26/2011  . Incisional hernia 04/04/2011  . Dysuria-frequency syndrome 11/09/2010  . Chronic anticoagulation 10/07/2010  . Atrial fibrillation 08/26/2009  . EDEMA 08/26/2009  . NEOPLASM UNSPECIFIED NATURE DIGESTIVE SYSTEM 03/06/2008    GP    Fotios Amos 12/16/2012, 8:33 AM

## 2012-12-18 ENCOUNTER — Ambulatory Visit (INDEPENDENT_AMBULATORY_CARE_PROVIDER_SITE_OTHER): Payer: Medicare Other | Admitting: *Deleted

## 2012-12-18 DIAGNOSIS — Z7901 Long term (current) use of anticoagulants: Secondary | ICD-10-CM

## 2012-12-18 DIAGNOSIS — I4891 Unspecified atrial fibrillation: Secondary | ICD-10-CM

## 2012-12-18 LAB — POCT INR: INR: 3.1

## 2012-12-19 ENCOUNTER — Ambulatory Visit (HOSPITAL_COMMUNITY)
Admission: RE | Admit: 2012-12-19 | Discharge: 2012-12-19 | Disposition: A | Discharge status: Home / Self Care | Payer: Medicare Other | Source: Ambulatory Visit | Attending: Internal Medicine | Admitting: Internal Medicine

## 2012-12-19 NOTE — Progress Notes (Signed)
Physical Therapy - Wound Therapy  Treatment   Patient Details  Name: Marissa [name] MRN: [id number] Date of Birth: Feb 12, 1933  Today's Date: 12/19/2012 Time: 1105-1140 Time Calculation (min): 35 min  Visit#: 14 of 18  Re-eval: 12/29/12 Charges: Selective debridement (= or < 20 cm)  Subjective Subjective Assessment Subjective: Pt has no pain until therapist begins debridrment.  Pain Assessment Pain Assessment Pain Assessment: No/denies pain  Wound Therapy Wound 11/08/12 Other (Comment) Leg Right;Posterior;Lateral (Active)  Site / Wound Assessment Pink;Red;Yellow 12/19/2012  1:09 PM  % Wound base Red or Granulating 35% 12/19/2012  1:09 PM  % Wound base Yellow 65% 12/19/2012  1:09 PM  % Wound base Black 0% 12/19/2012  1:09 PM  Peri-wound Assessment Edema;Erythema (blanchable);Maceration 12/03/2012  8:43 AM  Wound Length (cm) 4 cm 11/29/2012  9:52 AM  Wound Width (cm) 2 cm 11/29/2012  9:52 AM  Wound Depth (cm) 0.2 cm 11/29/2012  9:52 AM  Margins Attached edges (approximated) 11/29/2012  9:52 AM  Closure None 11/29/2012  9:52 AM  Drainage Amount Moderate 12/19/2012  1:09 PM  Drainage Description No odor;Purulent 12/19/2012  1:09 PM  Non-staged Wound Description Full thickness 12/19/2012  1:09 PM  Treatment Cleansed;Debridement (Selective) 12/19/2012  1:09 PM  Dressing Type Moist to moist;ABD;Silver dressings;Alginate 12/19/2012  1:09 PM  Dressing Changed New 12/19/2012  1:09 PM  Dressing Status Clean;Dry 12/19/2012  1:09 PM     Wound 11/08/12 Other (Comment) Leg Left;Medial slightly iinferior to first wound (Active)  Site / Wound Assessment Yellow;Red;Pink 12/19/2012  1:09 PM  % Wound base Red or Granulating 30% 12/19/2012  1:09 PM  % Wound base Yellow 70% 12/19/2012  1:09 PM  % Wound base Black 0% 12/19/2012  1:09 PM  Peri-wound Assessment Edema;Erythema (non-blanchable) 11/29/2012  9:52 AM  Wound Length (cm) 3 cm 11/29/2012  9:52 AM  Wound Width (cm) 1.8 cm 11/29/2012  9:52 AM  Wound Depth  (cm) 0.3 cm 11/29/2012  9:52 AM  Margins Attached edges (approximated) 11/29/2012  9:52 AM  Drainage Amount Moderate 12/19/2012  1:09 PM  Drainage Description Purulent;No odor 12/19/2012  1:09 PM  Non-staged Wound Description Full thickness 12/19/2012  1:09 PM  Treatment Cleansed;Debridement (Selective);Hydrotherapy (Pulse lavage) 12/09/2012  8:36 AM  Dressing Type ABD;Gauze (Comment);Silver dressings;Alginate 12/19/2012  1:09 PM  Dressing Changed New 12/19/2012  1:09 PM  Dressing Status Clean;Dry;Intact 12/19/2012  1:09 PM   Hydrotherapy Pulsed lavage therapy - wound location: both wounds Pulsed Lavage with Suction (psi): 4 psi Pulsed Lavage with Suction - Normal Saline Used: 1000 mL Pulsed Lavage Tip: Tip with splash shield Selective Debridement Selective Debridement - Location: to posterior calf Selective Debridement - Tools Used: Other (comment) (gauze and qtips) Selective Debridement - Tissue Removed: slough   Physical Therapy Assessment and Plan Wound Therapy - Assess/Plan/Recommendations Wound Therapy - Clinical Statement: Pt continues to be extremely sensitive to debridement and pulse lavage. Pt is unable to tolerate full pulse lavage treatment. Able to remove good amount of necrotic tissue this session. Change dressing to aquacel silver dressing and then covered with alginate and abd pad. Dressing secured with kerlex wrap. Continued with no compression per PT. Wound Plan: Continue wound care per PT POC. D/C pulse lavage.  Problem List Patient Active Problem List   Diagnosis Date Noted  . Laboratory test 08/01/2012  . Aneurysm of unspecified site 11/07/2011  . Nonischemic cardiomyopathy 08/09/2011  . Chronic systolic heart failure 08/09/2011  . Hematoma, nontraumatic, soft tissue 08/03/2011  . Frequent falls 07/26/2011  .  Incisional hernia 04/04/2011  . Dysuria-frequency syndrome 11/09/2010  . Chronic anticoagulation 10/07/2010  . Atrial fibrillation 08/26/2009  . EDEMA 08/26/2009   . NEOPLASM UNSPECIFIED NATURE DIGESTIVE SYSTEM 03/06/2008   Seth Bake, PTA 12/19/2012, 1:26 PM

## 2012-12-23 ENCOUNTER — Ambulatory Visit (HOSPITAL_COMMUNITY)
Admission: RE | Admit: 2012-12-23 | Discharge: 2012-12-23 | Disposition: A | Discharge status: Home / Self Care | Payer: Medicare Other | Source: Ambulatory Visit | Attending: Internal Medicine | Admitting: Internal Medicine

## 2012-12-23 NOTE — Progress Notes (Signed)
Physical Therapy - Wound Therapy  Treatment   Patient Details  Name: Marissa [name] MRN: [id number] Date of Birth: [birth year]  Today's Date: 12/23/2012 Time: 6213-0865 Time Calculation (min): 29 min Visit#: 15 of 18  Re-eval: 12/29/12 Charges:  Deb<20cm  Subjective Subjective Assessment Subjective: Pt. states her wound is beginning to itch a little.  States no pain until therapist debrides wound.   Wound Therapy Wound 11/08/12 Other (Comment) Leg Right;Posterior;Lateral (Active)  Site / Wound Assessment Pink;Red;Yellow 12/23/2012  9:00 AM  % Wound base Red or Granulating 40% 12/23/2012  9:00 AM  % Wound base Yellow 60% 12/23/2012  9:00 AM  % Wound base Black 0% 12/23/2012  9:00 AM  Peri-wound Assessment Edema;Erythema (blanchable);Maceration 12/03/2012  8:43 AM  Wound Length (cm) 4 cm 11/29/2012  9:52 AM  Wound Width (cm) 2 cm 11/29/2012  9:52 AM  Wound Depth (cm) 0.2 cm 11/29/2012  9:52 AM  Margins Attached edges (approximated) 11/29/2012  9:52 AM  Closure None 11/29/2012  9:52 AM  Drainage Amount Minimal 12/23/2012  9:00 AM  Drainage Description No odor;Purulent 12/23/2012  9:00 AM  Non-staged Wound Description Full thickness 12/19/2012  1:09 PM  Treatment Cleansed;Debridement (Selective) 12/23/2012  9:00 AM  Dressing Type Silver dressings 12/23/2012  9:00 AM  Dressing Changed New 12/23/2012  9:00 AM  Dressing Status Clean;Dry 12/23/2012  9:00 AM     Wound 11/08/12 Other (Comment) Leg Left;Medial slightly iinferior to first wound (Active)  Site / Wound Assessment Yellow;Red;Pink 12/23/2012  9:00 AM  % Wound base Red or Granulating 40% 12/23/2012  9:00 AM  % Wound base Yellow 60% 12/23/2012  9:00 AM  % Wound base Black 0% 12/23/2012  9:00 AM  Peri-wound Assessment Edema;Erythema (non-blanchable) 11/29/2012  9:52 AM  Wound Length (cm) 3 cm 11/29/2012  9:52 AM  Wound Width (cm) 1.8 cm 11/29/2012  9:52 AM  Wound Depth (cm) 0.3 cm 11/29/2012  9:52 AM  Margins Attached edges (approximated)  11/29/2012  9:52 AM  Drainage Amount Minimal 12/23/2012  9:00 AM  Drainage Description Purulent;No odor 12/23/2012  9:00 AM  Non-staged Wound Description Full thickness 12/19/2012  1:09 PM  Treatment Cleansed;Debridement (Selective) 12/23/2012  9:00 AM  Dressing Type ABD;Gauze (Comment);Silver dressings;Alginate 12/23/2012  9:00 AM  Dressing Changed Changed 12/23/2012  9:00 AM  Dressing Status Clean;Dry;Intact 12/23/2012  9:00 AM   Selective Debridement Selective Debridement - Location: Rt posterior LE wounds Selective Debridement - Tools Used: Scalpel (gauze and qtips) Selective Debridement - Tissue Removed: slough   Physical Therapy Assessment and Plan Wound Therapy - Assess/Plan/Recommendations Wound Therapy - Clinical Statement: Able to remove good amount of slough this visit using scapel.  Pt. able to tolerate gentle scraping of wound bed.  Wounds irrigated with keracleanse with pulse lavage discharged per PT order. Wound Plan: Continue wound care per PT POC      Problem List Patient Active Problem List   Diagnosis Date Noted  . Laboratory test 08/01/2012  . Aneurysm of unspecified site 11/07/2011  . Nonischemic cardiomyopathy 08/09/2011  . Chronic systolic heart failure 08/09/2011  . Hematoma, nontraumatic, soft tissue 08/03/2011  . Frequent falls 07/26/2011  . Incisional hernia 04/04/2011  . Dysuria-frequency syndrome 11/09/2010  . Chronic anticoagulation 10/07/2010  . Atrial fibrillation 08/26/2009  . EDEMA 08/26/2009  . NEOPLASM UNSPECIFIED NATURE DIGESTIVE SYSTEM 03/06/2008     Lurena Nida, PTA/CLT 12/23/2012, 9:30 AM

## 2012-12-26 ENCOUNTER — Ambulatory Visit (HOSPITAL_COMMUNITY)
Admission: RE | Admit: 2012-12-26 | Discharge: 2012-12-26 | Disposition: A | Discharge status: Home / Self Care | Payer: Medicare Other | Source: Ambulatory Visit | Attending: Internal Medicine | Admitting: Internal Medicine

## 2012-12-26 NOTE — Progress Notes (Signed)
Physical Therapy - Wound Therapy  Treatment   Patient Details  Name: Marissa [name] MRN: [id number] Date of Birth: Apr 13, 1933  Today's Date: 12/26/2012 Time: 7829-5621 Time Calculation (min): 30 min  Visit#: 16 of 18  Re-eval: 12/29/12 Charges: Selective debridement (= or < 20 cm)   Subjective Subjective Assessment Subjective: Pt states that pain has been lower than usual.  Pain Assessment Pain Assessment Pain Score:   2 Pain Location: Leg Pain Orientation: Right;Posterior  Wound Therapy Wound 11/08/12 Other (Comment) Leg Right;Posterior;Lateral (Active)  Site / Wound Assessment Pink;Red;Yellow 12/26/2012  9:23 AM  % Wound base Red or Granulating 40% 12/26/2012  9:23 AM  % Wound base Yellow 60% 12/26/2012  9:23 AM  % Wound base Black 0% 12/26/2012  9:23 AM  Peri-wound Assessment Edema;Erythema (blanchable);Maceration 12/03/2012  8:43 AM  Wound Length (cm) 4 cm 11/29/2012  9:52 AM  Wound Width (cm) 2 cm 11/29/2012  9:52 AM  Wound Depth (cm) 0.2 cm 11/29/2012  9:52 AM  Margins Attached edges (approximated) 11/29/2012  9:52 AM  Closure None 11/29/2012  9:52 AM  Drainage Amount Minimal 12/26/2012  9:23 AM  Drainage Description No odor;Purulent 12/26/2012  9:23 AM  Non-staged Wound Description Full thickness 12/19/2012  1:09 PM  Treatment Cleansed;Debridement (Selective) 12/26/2012  9:23 AM  Dressing Type Silver dressings;Alginate;ABD;Gauze (Comment) 12/26/2012  9:23 AM  Dressing Changed New 12/26/2012  9:23 AM  Dressing Status Clean;Dry 12/26/2012  9:23 AM     Wound 11/08/12 Other (Comment) Leg Left;Medial slightly iinferior to first wound (Active)  Site / Wound Assessment Yellow;Red;Pink 12/26/2012  9:23 AM  % Wound base Red or Granulating 40% 12/26/2012  9:23 AM  % Wound base Yellow 60% 12/26/2012  9:23 AM  % Wound base Black 0% 12/26/2012  9:23 AM  Peri-wound Assessment Edema;Erythema (non-blanchable) 11/29/2012  9:52 AM  Wound Length (cm) 3 cm 11/29/2012  9:52 AM  Wound Width (cm) 1.8  cm 11/29/2012  9:52 AM  Wound Depth (cm) 0.3 cm 11/29/2012  9:52 AM  Margins Attached edges (approximated) 11/29/2012  9:52 AM  Drainage Amount Minimal 12/26/2012  9:23 AM  Drainage Description Purulent;No odor 12/26/2012  9:23 AM  Non-staged Wound Description Full thickness 12/19/2012  1:09 PM  Treatment Cleansed;Debridement (Selective) 12/26/2012  9:23 AM  Dressing Type ABD;Gauze (Comment);Silver dressings;Alginate 12/26/2012  9:23 AM  Dressing Changed New 12/26/2012  9:23 AM  Dressing Status Clean;Dry;Intact 12/26/2012  9:23 AM   Selective Debridement Selective Debridement - Location: Rt posterior LE wounds Selective Debridement - Tools Used: Scalpel;Forceps Selective Debridement - Tissue Removed: slough   Physical Therapy Assessment and Plan Wound Therapy - Assess/Plan/Recommendations Wound Therapy - Clinical Statement: Wound is progressing well. Pt is less sensitive to debridement this session. Good amount of necrotic tissue removed. Erythema has decreased and wound is without sign or sx of infection.  Wound Plan: Continue wound care per PT POC  Problem List Patient Active Problem List   Diagnosis Date Noted  . Laboratory test 08/01/2012  . Aneurysm of unspecified site 11/07/2011  . Nonischemic cardiomyopathy 08/09/2011  . Chronic systolic heart failure 08/09/2011  . Hematoma, nontraumatic, soft tissue 08/03/2011  . Frequent falls 07/26/2011  . Incisional hernia 04/04/2011  . Dysuria-frequency syndrome 11/09/2010  . Chronic anticoagulation 10/07/2010  . Atrial fibrillation 08/26/2009  . EDEMA 08/26/2009  . NEOPLASM UNSPECIFIED NATURE DIGESTIVE SYSTEM 03/06/2008    Seth Bake, PTA  12/26/2012, 9:31 AM

## 2012-12-27 ENCOUNTER — Other Ambulatory Visit: Payer: Self-pay

## 2012-12-27 MED ORDER — ATORVASTATIN CALCIUM 10 MG PO TABS: 10.0000 mg | ORAL_TABLET | Freq: Every day | ORAL | Status: DC

## 2012-12-30 ENCOUNTER — Ambulatory Visit (HOSPITAL_COMMUNITY)
Admission: RE | Admit: 2012-12-30 | Discharge: 2012-12-30 | Disposition: A | Discharge status: Home / Self Care | Payer: Medicare Other | Source: Ambulatory Visit | Attending: Internal Medicine | Admitting: Internal Medicine

## 2012-12-30 NOTE — Progress Notes (Signed)
Physical Therapy - Wound Therapy  Treatment   Patient Details  Name: Marissa [name] MRN: [id number] Date of Birth: 01-Nov-1932  Today's Date: 12/30/2012 Time: 4098-1191 Time Calculation (min): 20 min  Visit#: 17 of 18  Re-eval: 12/29/12 Charges: Selective debridement (= or < 20 cm)   Subjective Subjective Assessment Subjective: Pt states that her pain has been low lately. No pain this session except with debridement.  Pain Assessment Pain Assessment Pain Assessment: No/denies pain  Wound Therapy Wound 11/08/12 Other (Comment) Leg Right;Posterior;Lateral (Active)  Site / Wound Assessment Pink;Red;Yellow 12/30/2012 12:43 PM  % Wound base Red or Granulating 40% 12/30/2012 12:43 PM  % Wound base Yellow 60% 12/30/2012 12:43 PM  % Wound base Black 0% 12/30/2012 12:43 PM  Peri-wound Assessment Edema;Erythema (blanchable);Maceration 12/03/2012  8:43 AM  Wound Length (cm) 4 cm 11/29/2012  9:52 AM  Wound Width (cm) 2 cm 11/29/2012  9:52 AM  Wound Depth (cm) 0.2 cm 11/29/2012  9:52 AM  Margins Attached edges (approximated) 11/29/2012  9:52 AM  Closure None 11/29/2012  9:52 AM  Drainage Amount Minimal 12/30/2012 12:43 PM  Drainage Description No odor;Purulent 12/30/2012 12:43 PM  Non-staged Wound Description Full thickness 12/19/2012  1:09 PM  Treatment Cleansed;Debridement (Selective) 12/30/2012 12:43 PM  Dressing Type Silver dressings;Alginate;ABD;Gauze (Comment) 12/30/2012 12:43 PM  Dressing Changed New 12/30/2012 12:43 PM  Dressing Status Clean;Dry 12/30/2012 12:43 PM     Wound 11/08/12 Other (Comment) Leg Left;Medial slightly iinferior to first wound (Active)  Site / Wound Assessment Yellow;Red;Pink 12/30/2012 12:43 PM  % Wound base Red or Granulating 40% 12/30/2012 12:43 PM  % Wound base Yellow 60% 12/30/2012 12:43 PM  % Wound base Black 0% 12/30/2012 12:43 PM  Peri-wound Assessment Edema;Erythema (non-blanchable) 11/29/2012  9:52 AM  Wound Length (cm) 3 cm 11/29/2012  9:52 AM  Wound Width (cm)  1.8 cm 11/29/2012  9:52 AM  Wound Depth (cm) 0.3 cm 11/29/2012  9:52 AM  Margins Attached edges (approximated) 11/29/2012  9:52 AM  Drainage Amount Minimal 12/30/2012 12:43 PM  Drainage Description Purulent;No odor 12/30/2012 12:43 PM  Non-staged Wound Description Full thickness 12/19/2012  1:09 PM  Treatment Cleansed;Debridement (Selective) 12/30/2012 12:43 PM  Dressing Type ABD;Gauze (Comment);Silver dressings;Alginate 12/30/2012 12:43 PM  Dressing Changed New 12/30/2012 12:43 PM  Dressing Status Clean;Dry;Intact 12/30/2012 12:43 PM   Selective Debridement Selective Debridement - Location: Rt posterior LE wounds Selective Debridement - Tools Used: Scalpel;Forceps Selective Debridement - Tissue Removed: slough and dry skin at periwound   Physical Therapy Assessment and Plan Wound Therapy - Assess/Plan/Recommendations Wound Therapy - Clinical Statement: Wound continues to respond well to acticoat silver dressing. Pt is more tolerant to debridement. Able to remove good amount of necrotic tissue. Erythema at periwound has decreased.  Wound Plan: Continue wound care per PT POC   Problem List Patient Active Problem List   Diagnosis Date Noted  . Laboratory test 08/01/2012  . Aneurysm of unspecified site 11/07/2011  . Nonischemic cardiomyopathy 08/09/2011  . Chronic systolic heart failure 08/09/2011  . Hematoma, nontraumatic, soft tissue 08/03/2011  . Frequent falls 07/26/2011  . Incisional hernia 04/04/2011  . Dysuria-frequency syndrome 11/09/2010  . Chronic anticoagulation 10/07/2010  . Atrial fibrillation 08/26/2009  . EDEMA 08/26/2009  . NEOPLASM UNSPECIFIED NATURE DIGESTIVE SYSTEM 03/06/2008    Seth Bake, PTA  12/30/2012, 12:50 PM

## 2013-01-02 ENCOUNTER — Ambulatory Visit (HOSPITAL_COMMUNITY)
Admission: RE | Admit: 2013-01-02 | Discharge: 2013-01-02 | Disposition: A | Discharge status: Home / Self Care | Payer: Medicare Other | Source: Ambulatory Visit | Attending: Internal Medicine | Admitting: Internal Medicine

## 2013-01-02 NOTE — Progress Notes (Signed)
Physical Therapy - Wound Therapy Re-evaluation  Treatment   Patient Details  Name: Marissa [name] MRN: [id number] Date of Birth: March 12, 1933  Today's Date: 01/02/2013 Time: 4098-1191 Time Calculation (min): 30 min  Visit#: 18 of 26  Re-eval: 12/29/12 Charges: Selective debridement (= or < 20 cm)   Subjective Subjective Assessment Subjective: Pt states that dressing stayed on well.  Pain Assessment Pain Assessment Pain Assessment: No/denies pain  Wound Therapy Wound 11/08/12 Other (Comment) Leg Right;Posterior;Lateral (Active)  Site / Wound Assessment Pink;Red;Yellow 01/02/2013 10:22 AM  % Wound base Red or Granulating 40% 01/02/2013 10:22 AM  % Wound base Yellow 60% 01/02/2013 10:22 AM  % Wound base Black 0% 01/02/2013 10:22 AM  Peri-wound Assessment Edema;Erythema (blanchable);Maceration 12/03/2012  8:43 AM  Wound Length (cm) 2.5 cm 01/02/2013 10:22 AM  Wound Width (cm) 2.2 cm 01/02/2013 10:22 AM  Wound Depth (cm) 0.4 cm 01/02/2013 10:22 AM  Margins Attached edges (approximated) 11/29/2012  9:52 AM  Closure None 11/29/2012  9:52 AM  Drainage Amount Minimal 01/02/2013 10:22 AM  Drainage Description No odor;Purulent 01/02/2013 10:22 AM  Non-staged Wound Description Full thickness 12/19/2012  1:09 PM  Treatment Cleansed;Debridement (Selective) 01/02/2013 10:22 AM  Dressing Type Silver dressings;ABD;Gauze (Comment) 01/02/2013 10:22 AM  Dressing Changed New 01/02/2013 10:22 AM  Dressing Status Clean;Dry 01/02/2013 10:22 AM     Wound 11/08/12 Other (Comment) Leg Left;Medial slightly iinferior to first wound (Active)  Site / Wound Assessment Yellow;Red;Pink 01/02/2013 10:22 AM  % Wound base Red or Granulating 40% 01/02/2013 10:22 AM  % Wound base Yellow 60% 01/02/2013 10:22 AM  % Wound base Black 0% 01/02/2013 10:22 AM  Peri-wound Assessment Edema;Erythema (non-blanchable) 11/29/2012  9:52 AM  Wound Length (cm) 4 cm 01/02/2013 10:22 AM  Wound Width (cm) 2 cm 01/02/2013 10:22 AM  Wound Depth (cm)  3 cm 01/02/2013 10:22 AM  Margins Attached edges (approximated) 11/29/2012  9:52 AM  Drainage Amount Minimal 01/02/2013 10:22 AM  Drainage Description Purulent;No odor 01/02/2013 10:22 AM  Non-staged Wound Description Full thickness 12/19/2012  1:09 PM  Treatment Cleansed;Debridement (Selective) 01/02/2013 10:22 AM  Dressing Type ABD;Gauze (Comment);Silver dressings 01/02/2013 10:22 AM  Dressing Changed New 12/30/2012 12:43 PM  Dressing Status Clean;Dry;Intact 01/02/2013 10:22 AM   Selective Debridement Selective Debridement - Location: Rt posterior LE wounds Selective Debridement - Tools Used: Scalpel;Forceps Selective Debridement - Tissue Removed: slough and dry skin at periwound   Physical Therapy Assessment and Plan Wound Therapy - Assess/Plan/Recommendations Wound Therapy - Clinical Statement: Wounds are progressing well. Though wounds have minimally increased in size they appear over all much healthier. Erythema and tenderness continues to decrease. Wounds are without signs or sx of infection. Pt would benefit from continuing wound care to progress wound healing throughout debridement cleansing and dressing change. Wound Plan: Recommend to continue wound care x 4 weeks.      Goals    Problem List Patient Active Problem List   Diagnosis Date Noted  . Laboratory test 08/01/2012  . Aneurysm of unspecified site 11/07/2011  . Nonischemic cardiomyopathy 08/09/2011  . Chronic systolic heart failure 08/09/2011  . Hematoma, nontraumatic, soft tissue 08/03/2011  . Frequent falls 07/26/2011  . Incisional hernia 04/04/2011  . Dysuria-frequency syndrome 11/09/2010  . Chronic anticoagulation 10/07/2010  . Atrial fibrillation 08/26/2009  . EDEMA 08/26/2009  . NEOPLASM UNSPECIFIED NATURE DIGESTIVE SYSTEM 03/06/2008    GP Functional Limitation: Other PT primary Other PT Primary Current Status ([id number]): At least 60 percent but less than 80 percent impaired, limited or  restricted Other PT  Primary Goal Status [phone]): At least 1 percent but less than 20 percent impaired, limited or restricted  Seth Bake, PTA 01/02/2013, 10:29 AM

## 2013-01-05 ENCOUNTER — Emergency Department (HOSPITAL_COMMUNITY): Payer: Medicare Other

## 2013-01-05 ENCOUNTER — Encounter (HOSPITAL_COMMUNITY): Payer: Self-pay | Admitting: *Deleted

## 2013-01-05 ENCOUNTER — Inpatient Hospital Stay (HOSPITAL_COMMUNITY)
Admission: EM | Admit: 2013-01-05 | Discharge: 2013-01-16 | DRG: 280 | Disposition: A | Discharge status: Home Health | Payer: Medicare Other | Attending: Cardiology | Admitting: Cardiology

## 2013-01-05 DIAGNOSIS — I5023 Acute on chronic systolic (congestive) heart failure: Secondary | ICD-10-CM

## 2013-01-05 DIAGNOSIS — N390 Urinary tract infection, site not specified: Secondary | ICD-10-CM | POA: Diagnosis present

## 2013-01-05 DIAGNOSIS — R079 Chest pain, unspecified: Secondary | ICD-10-CM

## 2013-01-05 DIAGNOSIS — I2510 Atherosclerotic heart disease of native coronary artery without angina pectoris: Secondary | ICD-10-CM | POA: Diagnosis present

## 2013-01-05 DIAGNOSIS — A498 Other bacterial infections of unspecified site: Secondary | ICD-10-CM | POA: Diagnosis present

## 2013-01-05 DIAGNOSIS — I495 Sick sinus syndrome: Secondary | ICD-10-CM

## 2013-01-05 DIAGNOSIS — I4729 Other ventricular tachycardia: Secondary | ICD-10-CM | POA: Diagnosis not present

## 2013-01-05 DIAGNOSIS — I2542 Coronary artery dissection: Secondary | ICD-10-CM

## 2013-01-05 DIAGNOSIS — M519 Unspecified thoracic, thoracolumbar and lumbosacral intervertebral disc disorder: Secondary | ICD-10-CM | POA: Insufficient documentation

## 2013-01-05 DIAGNOSIS — I059 Rheumatic mitral valve disease, unspecified: Secondary | ICD-10-CM | POA: Diagnosis present

## 2013-01-05 DIAGNOSIS — I252 Old myocardial infarction: Secondary | ICD-10-CM

## 2013-01-05 DIAGNOSIS — I4891 Unspecified atrial fibrillation: Secondary | ICD-10-CM | POA: Diagnosis present

## 2013-01-05 DIAGNOSIS — I472 Ventricular tachycardia, unspecified: Secondary | ICD-10-CM | POA: Diagnosis not present

## 2013-01-05 DIAGNOSIS — I214 Non-ST elevation (NSTEMI) myocardial infarction: Principal | ICD-10-CM | POA: Diagnosis present

## 2013-01-05 DIAGNOSIS — I129 Hypertensive chronic kidney disease with stage 1 through stage 4 chronic kidney disease, or unspecified chronic kidney disease: Secondary | ICD-10-CM | POA: Diagnosis present

## 2013-01-05 DIAGNOSIS — K219 Gastro-esophageal reflux disease without esophagitis: Secondary | ICD-10-CM | POA: Diagnosis present

## 2013-01-05 DIAGNOSIS — N179 Acute kidney failure, unspecified: Secondary | ICD-10-CM | POA: Diagnosis present

## 2013-01-05 DIAGNOSIS — N183 Chronic kidney disease, stage 3 unspecified: Secondary | ICD-10-CM

## 2013-01-05 DIAGNOSIS — L03115 Cellulitis of right lower limb: Secondary | ICD-10-CM | POA: Diagnosis present

## 2013-01-05 DIAGNOSIS — I509 Heart failure, unspecified: Secondary | ICD-10-CM | POA: Diagnosis present

## 2013-01-05 DIAGNOSIS — E785 Hyperlipidemia, unspecified: Secondary | ICD-10-CM | POA: Diagnosis present

## 2013-01-05 DIAGNOSIS — M109 Gout, unspecified: Secondary | ICD-10-CM | POA: Insufficient documentation

## 2013-01-05 DIAGNOSIS — Z7901 Long term (current) use of anticoagulants: Secondary | ICD-10-CM

## 2013-01-05 DIAGNOSIS — N19 Unspecified kidney failure: Secondary | ICD-10-CM

## 2013-01-05 DIAGNOSIS — I359 Nonrheumatic aortic valve disorder, unspecified: Secondary | ICD-10-CM | POA: Diagnosis present

## 2013-01-05 DIAGNOSIS — Z8542 Personal history of malignant neoplasm of other parts of uterus: Secondary | ICD-10-CM

## 2013-01-05 DIAGNOSIS — I10 Essential (primary) hypertension: Secondary | ICD-10-CM | POA: Diagnosis present

## 2013-01-05 DIAGNOSIS — R5381 Other malaise: Secondary | ICD-10-CM | POA: Diagnosis present

## 2013-01-05 DIAGNOSIS — L02419 Cutaneous abscess of limb, unspecified: Secondary | ICD-10-CM | POA: Diagnosis present

## 2013-01-05 DIAGNOSIS — L97909 Non-pressure chronic ulcer of unspecified part of unspecified lower leg with unspecified severity: Secondary | ICD-10-CM | POA: Diagnosis present

## 2013-01-05 DIAGNOSIS — Z9181 History of falling: Secondary | ICD-10-CM

## 2013-01-05 DIAGNOSIS — I428 Other cardiomyopathies: Secondary | ICD-10-CM | POA: Diagnosis present

## 2013-01-05 DIAGNOSIS — M069 Rheumatoid arthritis, unspecified: Secondary | ICD-10-CM | POA: Diagnosis present

## 2013-01-05 HISTORY — DX: Other specified postprocedural states: Z98.890

## 2013-01-05 HISTORY — DX: Hyperlipidemia, unspecified: E78.5

## 2013-01-05 HISTORY — DX: Permanent atrial fibrillation: I48.21

## 2013-01-05 HISTORY — DX: Gastro-esophageal reflux disease without esophagitis: K21.9

## 2013-01-05 HISTORY — DX: Gout, unspecified: M10.9

## 2013-01-05 HISTORY — DX: Personal history of malignant neoplasm of other parts of uterus: Z85.42

## 2013-01-05 HISTORY — DX: Unspecified thoracic, thoracolumbar and lumbosacral intervertebral disc disorder: M51.9

## 2013-01-05 HISTORY — DX: Coronary artery dissection: I25.42

## 2013-01-05 HISTORY — DX: Rheumatoid arthritis, unspecified: M06.9

## 2013-01-05 HISTORY — DX: Nausea with vomiting, unspecified: R11.2

## 2013-01-05 LAB — CBC WITH DIFFERENTIAL/PLATELET
Basophils Absolute: 0 10*3/uL (ref 0.0–0.1)
Basophils Relative: 1 % (ref 0–1)
HCT: 40.3 % (ref 36.0–46.0)
MCHC: 32 g/dL (ref 30.0–36.0)
Monocytes Absolute: 0.7 10*3/uL (ref 0.1–1.0)
Neutro Abs: 5.2 10*3/uL (ref 1.7–7.7)
Neutrophils Relative %: 65 % (ref 43–77)
Platelets: 223 10*3/uL (ref 150–400)
RDW: 15 % (ref 11.5–15.5)

## 2013-01-05 LAB — BASIC METABOLIC PANEL
Chloride: 102 mEq/L (ref 96–112)
Creatinine, Ser: 1.77 mg/dL — ABNORMAL HIGH (ref 0.50–1.10)
GFR calc Af Amer: 31 mL/min — ABNORMAL LOW (ref >90)

## 2013-01-05 LAB — PRO B NATRIURETIC PEPTIDE: Pro B Natriuretic peptide (BNP): 3772 pg/mL — ABNORMAL HIGH (ref 0–450)

## 2013-01-05 LAB — TROPONIN I: Troponin I: <0.3 ng/mL (ref <0.30)

## 2013-01-05 MED ORDER — MORPHINE SULFATE 2 MG/ML IJ SOLN
INTRAMUSCULAR | Status: AC
  Filled 2013-01-05: qty 1

## 2013-01-05 MED ORDER — SODIUM CHLORIDE 0.9 % IV SOLN
INTRAVENOUS | Status: AC
  Administered 2013-01-06: 04:00:00 via INTRAVENOUS

## 2013-01-05 MED ORDER — MORPHINE SULFATE 2 MG/ML IJ SOLN
2.0000 mg | Freq: Once | INTRAMUSCULAR | Status: AC
  Administered 2013-01-05: 2 mg via INTRAVENOUS

## 2013-01-05 MED ORDER — ONDANSETRON HCL 4 MG/2ML IJ SOLN: 4.0000 mg | Freq: Three times a day (TID) | INTRAMUSCULAR | Status: AC | PRN

## 2013-01-05 MED ORDER — NITROGLYCERIN 0.4 MG SL SUBL
0.4000 mg | SUBLINGUAL_TABLET | SUBLINGUAL | Status: AC | PRN
  Administered 2013-01-05 (×3): 0.4 mg via SUBLINGUAL
  Filled 2013-01-05: qty 25

## 2013-01-05 NOTE — ED Notes (Signed)
Pt c/o centralized chest pain that began about 1 hour ago. Pt describes pain as pressure and constant. Pt also reports numbness/pain in both arms. Pt states "I am always SOB but today it's a little worse than usual". Pt states pain also radiates to back and up her neck. Pt denies NVD, lightheadedness, dizziness, diaphoresis, headache.

## 2013-01-05 NOTE — ED Provider Notes (Addendum)
History    CSN: [id number] Arrival date & time 01/05/13  1845  First MD Initiated Contact with Patient 01/05/13 1857     Chief Complaint  Patient presents with  . Chest Pain   (Consider location/radiation/quality/duration/timing/severity/associated sxs/prior Treatment) HPI Comments: 79 year old female with a history of severe congestive heart failure. She has nonobstructive nonischemic cardiomyopathy and had a heart catheterization approximately one and a half years ago confirming no obstructive disease. Her last echocardiogram was performed this last 6 months and showed an ejection fraction of approximately 15-20%.  She presents because of chest pain which started at 6:00 PM, was acute in onset, persistent, substernal and heavy in nature, radiating to both arms and shoulders. She has not had this in the past, this is associated with shortness of breath and increased dyspnea. She denies coughing, fever but does have chronic swelling of her lower extremities. She has had Coumadin which she takes daily, and no aspirin today. No other medications for pain. The chest pain has gone away but she still feels an aching pain in her bilateral shoulders as well as a numb or abnormal feeling in her neck and throat.  Cardiology, Dr. wall  Family doctor, Dr. [name]  Patient is a 79 y.o. female presenting with chest pain. The history is provided by the patient, a relative and medical records.  Chest Pain  Past Medical History  Diagnosis Date  . Edema   . Atrial fibrillation     A.  Chronic Coumadin  . Neoplasm     unspecifide nature digestive system  . MVP (mitral valve prolapse)   . Hip fracture     left  . Hypertension   . Nonischemic cardiomyopathy     A.  07/23/11 - Echo: EF 35%;  B. 07/24/2011 - Cath: nonobs dzs ef 35%, 2+MR   . Systolic CHF, chronic     A. Diag 07/2011, EF 35%  . Falls frequently    Past Surgical History  Procedure Laterality Date  . Appendectomy    . Abdominal  hysterectomy    . Hernia repair  09/2008  . Hernia repair  04/17/11    lap ventral incisional hernia repair with mesh    No family history on file. History  Substance Use Topics  . Smoking status: Never Smoker   . Smokeless tobacco: Never Used  . Alcohol Use: Yes     Comment: rare   OB History   Grav Para Term Preterm Abortions TAB SAB Ect Mult Living                 Review of Systems  Cardiovascular: Positive for chest pain.  All other systems reviewed and are negative.    Allergies  Codeine and Penicillins  Home Medications   Current Outpatient Rx  Name  Route  Sig  Dispense  Refill  . alendronate (FOSAMAX) 70 MG tablet   Oral   Take 70 mg by mouth every 7 (seven) days. Take with a full glass of water on an empty stomach. Takes on Saturday         . atorvastatin (LIPITOR) 10 MG tablet   Oral   Take 10 mg by mouth at bedtime.         . Cholecalciferol (VITAMIN D) 2000 UNITS CAPS   Oral   Take 2,000 Units by mouth daily.         . digoxin (LANOXIN) 0.125 MG tablet   Oral   Take 0.125 mg by mouth at  bedtime.         . folic acid (FOLVITE) 1 MG tablet   Oral   Take 1 mg by mouth daily.         . furosemide (LASIX) 40 MG tablet   Oral   Take 1 tablet (40 mg total) by mouth 2 (two) times daily.   60 tablet   12   . gabapentin (NEURONTIN) 300 MG capsule   Oral   Take 300 mg by mouth at bedtime.         Marland Kitchen HYDROcodone-acetaminophen (NORCO/VICODIN) 5-325 MG per tablet   Oral   Take 1 tablet by mouth every morning.         . hydroxychloroquine (PLAQUENIL) 200 MG tablet   Oral   Take 1 tablet by mouth Twice daily.         . Hypromellose (GENTEAL OP)   Both Eyes   Place 1 drop into both eyes daily as needed. Tired Eyes         . losartan (COZAAR) 50 MG tablet   Oral   Take 1 tablet (50 mg total) by mouth daily.   30 tablet   5   . metoprolol succinate (TOPROL-XL) 50 MG 24 hr tablet   Oral   Take 25 mg by mouth 2 (two) times daily.  Take with or immediately following a meal.         . potassium chloride (K-DUR) 10 MEQ tablet   Oral   Take 10 mEq by mouth every morning.         . predniSONE (DELTASONE) 5 MG tablet   Oral   Take 5 mg by mouth daily.         Marland Kitchen warfarin (COUMADIN) 5 MG tablet   Oral   Take 2.5-5 mg by mouth daily. 2.5mg  on Mondays. Takes one tablet 5mg  on all other days          BP 143/65  Pulse 95  Temp(Src) 98.8 F (37.1 C) (Oral)  Resp 28  Ht 5\' 5"  (1.651 m)  Wt 173 lb (78.472 kg)  BMI 28.79 kg/m2  SpO2 98% Physical Exam  Nursing note and vitals reviewed. Constitutional: She appears well-developed and well-nourished. She appears distressed.  HENT:  Head: Normocephalic and atraumatic.  Mouth/Throat: Oropharynx is clear and moist. No oropharyngeal exudate.  Eyes: Conjunctivae and EOM are normal. Pupils are equal, round, and reactive to light. Right eye exhibits no discharge. Left eye exhibits no discharge. No scleral icterus.  Neck: Normal range of motion. Neck supple. No JVD present. No thyromegaly present.  Cardiovascular: Normal rate, regular rhythm and intact distal pulses.  Exam reveals no gallop and no friction rub.   No murmur heard. Distant heart sounds  Pulmonary/Chest: Effort normal and breath sounds normal. No respiratory distress. She has no wheezes. She has no rales.  Abdominal: Soft. Bowel sounds are normal. She exhibits no distension and no mass. There is no tenderness.  Musculoskeletal: Normal range of motion. She exhibits edema ( bilateral LE edema). She exhibits no tenderness.  Lymphadenopathy:    She has no cervical adenopathy.  Neurological: She is alert. Coordination normal.  Skin: Skin is warm and dry. No rash noted. No erythema.  Psychiatric: She has a normal mood and affect. Her behavior is normal.    ED Course  Procedures (including critical care time) Labs Reviewed  BASIC METABOLIC PANEL - Abnormal; Notable for the following:    Glucose, Bld 115 (*)     BUN  34 (*)    Creatinine, Ser 1.77 (*)    GFR calc non Af Amer 27 (*)    GFR calc Af Amer 31 (*)    All other components within normal limits  PROTIME-INR - Abnormal; Notable for the following:    Prothrombin Time 30.2 (*)    INR 3.02 (*)    All other components within normal limits  PRO B NATRIURETIC PEPTIDE - Abnormal; Notable for the following:    Pro B Natriuretic peptide (BNP) 3772.0 (*)    All other components within normal limits  CBC WITH DIFFERENTIAL  TROPONIN I  DIGOXIN LEVEL   Dg Chest Portable 1 View  01/05/2013   *RADIOLOGY REPORT*  Clinical Data: Shortness of breath and chest pain.  PORTABLE CHEST - 1 VIEW  Comparison: PA and lateral chest 07/23/2011 and 08/03/2012.  Findings: There is cardiomegaly and mild interstitial edema.  Trace left pleural effusion is noted.  Chronic, mild elevation of the right hemidiaphragm relative to the left is noted.  No consolidative process or pneumothorax identified.  IMPRESSION: Cardiomegaly and mild interstitial edema with a small left effusion.   Original Report Authenticated By: Holley Dexter, M.D.   1. Acute CHF   2. Chest pain   3. Renal failure     MDM  The pt appears acutely dyspneic. She is able to speak in full sentences but does have an increased respiratory rate. Her EKG shows atrial fibrillation, she has ST depressions in her lateral precordial leads which are concerning and which are new compared to prior EKG. Given her symptoms and her history of a prior non-ST elevation myocardial infarction which she suffered in 2013 even though she had a nonischemic cardiomyopathy, she will need to have further evaluation, laboratory workup, x-ray and inpatient evaluation for possible ischemia.   ED ECG REPORT  I personally interpreted this EKG   Date: 01/05/2013   Rate: 101  Rhythm: atrial fibrillation  QRS Axis: normal  Intervals: normal  ST/T Wave abnormalities: nonspecific ST/T changes  Conduction Disutrbances:none   Narrative Interpretation:   Old EKG Reviewed: SLT, ST and T wave abn now present  Discussed care with Dr. Donnie Aho of cardiology who accepts care of the patient in transfer to [facility].  Laboratory data shows that the patient has a white blood cell count of 8000, normal hemoglobin, creatinine of 1.77 which is new and abnormal for the patient. Troponin is normal, BNP is greater than 3000 and the INR is 3.02.  The pt has very poor cardiac function, she has chest pain that is classic of unstable angina and hx of NSTEMI thus I suspect that this is cardiac ischemia of unknown source.  She is already anticoagulated on coumadin with appropriate INR but will be given nitro.  She does not require rate control at this time as she has pulse around 100.  Morphine given for pain, cardiac monitoring throughout stay showed no signs of significant arrhythmia other than her afib.  She has ongoing ST depressions which again are concerning for ischemia.  She will need step down unit at [facility] for close cardiac monitoring and cardiac troponin cycling.   The pt is critically ill and will need close monitoring, rapid cardiac evaluation and may need heart catheterization.  CRITICAL CARE Performed by: Vida Roller Total critical care time: 35 Critical care time was exclusive of separately billable procedures and treating other patients. Critical care was necessary to treat or prevent imminent or life-threatening deterioration. Critical care was  time spent personally by me on the following activities: development of treatment plan with patient and/or surrogate as well as nursing, discussions with consultants, evaluation of patient's response to treatment, examination of patient, obtaining history from patient or surrogate, ordering and performing treatments and interventions, ordering and review of laboratory studies, ordering and review of radiographic studies, pulse oximetry and re-evaluation of patient's  condition.   I personally interpreted the x-ray and find her to be interstitial edema consistent with acute congestive heart failure. She also has cardiomegaly.    Vida Roller, MD 01/05/13 4540  Vida Roller, MD 01/06/13 1145

## 2013-01-05 NOTE — H&P (Signed)
History and Physical   Admit date: 01/05/2013 Name:  Marissa [name] Medical record number: [id number] DOB/Age:  Dec 07, 1932  79 y.o. female  Referring Physician:  [facility]  Primary Cardiologist: Valera Castle  Primary Physician:  Dr. Carylon Perches  Chief complaint/reason for admission:   Chest pain and shortness of breath  HPI:  79 year old female has a quite complex cardiac history. She has a history of a nonischemic cardiomyopathy with a most recent ejection fraction between 15 and 20%. She also has long-standing rheumatoid arthritis since age 43 and has known vasculitis from this. She has chronic atrial fibrillation and chronic systolic heart failure. She has been on long-standing warfarin therapy. She was admitted with some mild congestive heart failure and cellulitis earlier this year and has been treated at the wound clinic. She developed chest discomfort described as bilateral arm pain as well as mid sternal chest pain today that was severe associated with shortness of breath and came to the emergency room. She was found to be in atrial fibrillation with rapid response with ST depression had a mildly elevated BNP and interstitial congestion on x-ray and the emergency room physician felt that she should be transferred here. She is currently feeling better although she remains dyspneic. Her atrial fibrillation rate remains somewhat rapid. She denies PND orthopnea. 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   Past Medical History  Diagnosis Date  . Atrial fibrillation     A.  Chronic Coumadin  . MVP (mitral valve prolapse)   . Hypertension   . Nonischemic cardiomyopathy 2001    A.  07/22/12 - Echo: EF 15-20%;  B. 07/24/2011 - Cath: nonobs dzs ef 35%, 2+MR   . Systolic CHF, chronic   . Lumbar disc disease     laminectomy 2001  . History of endometrial cancer   . Hypertension   . Hyperlipidemia   . Rheumatoid arthritis     treated in past with Remicade  and developed vasculitis  . Gout      Past Surgical History  Procedure Laterality Date  . Appendectomy    . Abdominal hysterectomy  2000    Stage 1 endometrial cancer  . Hernia repair  09/2008  . Hernia repair  04/17/11    lap ventral incisional hernia repair with mesh   . Gastric biopsy      benign  . Excision of lipoma    . Lumbar laminectomy  2001  . Hammer toe surgery    .  Allergies: is allergic to codeine and penicillins.   Medications: Prior to Admission medications   Medication Sig Start Date End Date Taking? Authorizing Provider  alendronate (FOSAMAX) 70 MG tablet Take 70 mg by mouth every 7 (seven) days. Take with a full glass of water on an empty stomach. Takes on Saturday   Yes Historical Provider, MD  atorvastatin (LIPITOR) 10 MG tablet Take 10 mg by mouth at bedtime. 12/27/12  Yes Gaylord Shih, MD  Cholecalciferol (VITAMIN D) 2000 UNITS CAPS Take 2,000 Units by mouth daily.   Yes Historical Provider, MD  digoxin (LANOXIN) 0.125 MG tablet Take 0.125 mg by mouth at bedtime. 09/11/12  Yes Gaylord Shih, MD  folic acid (FOLVITE) 1 MG tablet Take 1 mg by mouth daily.   Yes Historical Provider, MD  furosemide (LASIX) 40 MG tablet Take 1 tablet (40 mg total) by mouth 2 (two) times daily. 08/06/12  Yes Carylon Perches, MD  gabapentin (NEURONTIN) 300 MG capsule Take 300 mg by mouth at bedtime.   Yes Historical Provider, MD  HYDROcodone-acetaminophen (NORCO/VICODIN) 5-325 MG per tablet Take 1 tablet by mouth every morning.   Yes Historical Provider, MD  hydroxychloroquine (PLAQUENIL) 200 MG tablet Take 1 tablet by mouth Twice daily. 07/19/12  Yes Historical Provider, MD  Hypromellose (GENTEAL OP) Place 1 drop into both eyes daily as needed. Tired Eyes   Yes Historical Provider, MD  losartan (COZAAR) 50 MG tablet Take 1 tablet (50 mg total) by mouth daily. 09/06/12  Yes Gaylord Shih, MD  metoprolol succinate (TOPROL-XL) 50 MG 24 hr tablet Take 25 mg by mouth 2 (two) times daily. Take with or  immediately following a meal.   Yes Historical Provider, MD  potassium chloride (K-DUR) 10 MEQ tablet Take 10 mEq by mouth every morning. 08/06/12  Yes Carylon Perches, MD  predniSONE (DELTASONE) 5 MG tablet Take 5 mg by mouth daily.   Yes Historical Provider, MD  warfarin (COUMADIN) 5 MG tablet Take 2.5-5 mg by mouth daily. 2.5mg  on Mondays. Takes one tablet 5mg  on all other days 08/26/12  Yes Gaylord Shih, MD    Family History:  Family Status  Relation Status Death Age  . Father Deceased 68    stomach cancer  . Mother Alive 89    Social History:   reports that she has never smoked. She has never used smokeless tobacco. She reports that  drinks alcohol. She reports that she does not use illicit drugs.   History   Social History Narrative  . No narrative on file     Review of Systems:  She has had bilateral cataract surgery in the past. She has also had laser treatments on her eyes. She has some mild chronic dyspnea. She has had frequent falls in the past and receives a lot of help at home. She has to walk with a cane or a walker at times. She has significant urinary frequency. She has occasional indigestion. She has significant cellulitis in the past and also has significant rheumatoid arthritis noted in the past. Other than as noted above, the remainder of the review of systems is normal  Physical Exam: BP 151/62  Pulse 85  Temp(Src) 98.8 F (37.1 C) (Oral)  Resp 20  Ht 5\' 5"  (1.651 m)  Wt 78.472 kg (173 lb)  BMI 28.79 kg/m2  SpO2 98%  General appearance: Pleasant white female who is mildly dyspneic at rest, somewhat pale Head: Normocephalic, without obvious abnormality, atraumatic Eyes: conjunctivae/corneas clear. PERRL, EOM's intact. Fundi benign. Neck: no adenopathy, no carotid bruit, no JVD and supple, symmetrical, trachea midline Lungs: Faint basilar crackles Heart: Rapid irregular rhythm, normal S1-S2, no S3, faint 1-2/6 systolic murmur Abdomen: soft, non-tender; bowel  sounds normal; no masses,  no organomegaly Pelvic: deferred Extremities: Right leg is bandaged with a compression device on the lower calf, mild venous changes of chronic venous insufficiency noted, bilateral bunions and hammertoes present Pulses: 2+ and symmetric Skin: Skin color, texture, turgor normal. No rashes or lesions Neurologic: Grossly normal  Labs: CBC  Recent Labs  01/05/13 1903  WBC 8.1  RBC 4.11  HGB 12.9  HCT 40.3  PLT 223  MCV 98.1  MCH 31.4  MCHC 32.0  RDW 15.0  LYMPHSABS 2.0  MONOABS 0.7  EOSABS 0.1  BASOSABS 0.0   CMP   Recent Labs  01/05/13 1903  NA 144  K 4.0  CL 102  CO2 29  GLUCOSE 115*  BUN 34*  CREATININE 1.77*  CALCIUM 10.3  GFRNONAA 27*  GFRAA 31*   BNP (last 3 results)  Recent Labs  08/03/12 0840 01/05/13 1903  PROBNP 10951.0* 3772.0*   Cardiac Panel (last 3 results)  Recent Labs  01/05/13 1903  TROPONINI <0.30   Thyroid  Lab Results  Component Value Date   TSH 0.801 08/03/2012    EKG: Atrial fibrillation with rapid ventricular response, ST depression laterally  Radiology: Cardiomegaly with interstitial congestion, small left pleural effusion   IMPRESSIONS: 1. Acute on chronic systolic congestive heart failure 2. Prolonged chest pain at rest with previous normal coronary arteries 3. Atrial fibrillation with rapid ventricular response 4. Nonischemic cardiomyopathy 5. Severe rheumatoid arthritis 6. Chronic cellulitis with nonhealing wound 7. Hypertension 8. Long-term anticoagulation with warfarin 9. Acute renal failure with elevation of creatinine   PLAN: She is currently on warfarin. Initial troponin is negative. We will control her atrial fibrillation rate and give her furosemide to diurese her. Check serial cardiac enzymes. Defer heparin at the present time. Continue wound treatment. Further workup and treatment per [facility] cardiology.  Signed: Darden Palmer MD [facility]  01/05/2013,  11:43 PM

## 2013-01-05 NOTE — ED Notes (Signed)
Pt c/o mid center chest pain that radiates to left chest area, bilateral arms, mouth area. Pain started about a hour ago while pt was answering the phone,

## 2013-01-05 NOTE — Progress Notes (Signed)
Patient arrive to the floor at 2319 from [facility], denies any chest pain, VS stable. No acute distress at the moment.   [name]

## 2013-01-06 DIAGNOSIS — I472 Ventricular tachycardia: Secondary | ICD-10-CM

## 2013-01-06 DIAGNOSIS — I10 Essential (primary) hypertension: Secondary | ICD-10-CM

## 2013-01-06 DIAGNOSIS — I509 Heart failure, unspecified: Secondary | ICD-10-CM

## 2013-01-06 DIAGNOSIS — I469 Cardiac arrest, cause unspecified: Secondary | ICD-10-CM

## 2013-01-06 DIAGNOSIS — N19 Unspecified kidney failure: Secondary | ICD-10-CM

## 2013-01-06 DIAGNOSIS — Z7901 Long term (current) use of anticoagulants: Secondary | ICD-10-CM

## 2013-01-06 DIAGNOSIS — I4891 Unspecified atrial fibrillation: Secondary | ICD-10-CM

## 2013-01-06 DIAGNOSIS — I5023 Acute on chronic systolic (congestive) heart failure: Secondary | ICD-10-CM

## 2013-01-06 DIAGNOSIS — I369 Nonrheumatic tricuspid valve disorder, unspecified: Secondary | ICD-10-CM

## 2013-01-06 DIAGNOSIS — I214 Non-ST elevation (NSTEMI) myocardial infarction: Secondary | ICD-10-CM | POA: Diagnosis present

## 2013-01-06 LAB — BASIC METABOLIC PANEL
BUN: 29 mg/dL — ABNORMAL HIGH (ref 6–23)
CO2: 30 mEq/L (ref 19–32)
Calcium: 9.5 mg/dL (ref 8.4–10.5)
Calcium: 9.3 mg/dL (ref 8.4–10.5)
GFR calc non Af Amer: 32 mL/min — ABNORMAL LOW (ref >90)
Glucose, Bld: 131 mg/dL — ABNORMAL HIGH (ref 70–99)
Glucose, Bld: 145 mg/dL — ABNORMAL HIGH (ref 70–99)
Sodium: 146 mEq/L — ABNORMAL HIGH (ref 135–145)
Sodium: 143 mEq/L (ref 135–145)

## 2013-01-06 LAB — MRSA PCR SCREENING: MRSA by PCR: NEGATIVE

## 2013-01-06 LAB — COMPREHENSIVE METABOLIC PANEL
ALT: 24 U/L (ref 0–35)
Alkaline Phosphatase: 53 U/L (ref 39–117)
CO2: 30 mEq/L (ref 19–32)
Chloride: 104 mEq/L (ref 96–112)
GFR calc Af Amer: 37 mL/min — ABNORMAL LOW (ref >90)
GFR calc non Af Amer: 32 mL/min — ABNORMAL LOW (ref >90)
Glucose, Bld: 95 mg/dL (ref 70–99)
Potassium: 3.9 mEq/L (ref 3.5–5.1)
Sodium: 142 mEq/L (ref 135–145)
Total Protein: 7.1 g/dL (ref 6.0–8.3)

## 2013-01-06 LAB — PROTIME-INR: INR: 2.71 — ABNORMAL HIGH (ref 0.00–1.49)

## 2013-01-06 MED ORDER — HEPARIN (PORCINE) IN NACL 100-0.45 UNIT/ML-% IJ SOLN
1200.0000 [IU]/h | INTRAMUSCULAR | Status: DC
  Administered 2013-01-06: 1200 [IU]/h via INTRAVENOUS
  Filled 2013-01-06: qty 250

## 2013-01-06 MED ORDER — POTASSIUM CHLORIDE ER 10 MEQ PO TBCR
20.0000 meq | EXTENDED_RELEASE_TABLET | Freq: Two times a day (BID) | ORAL | Status: DC
  Administered 2013-01-06 – 2013-01-09 (×9): 20 meq via ORAL
  Filled 2013-01-06 (×11): qty 2

## 2013-01-06 MED ORDER — SODIUM CHLORIDE 0.9 % IV SOLN: 250.0000 mL | INTRAVENOUS | Status: DC | PRN

## 2013-01-06 MED ORDER — METOPROLOL SUCCINATE ER 50 MG PO TB24
50.0000 mg | ORAL_TABLET | Freq: Two times a day (BID) | ORAL | Status: DC
  Administered 2013-01-06: 50 mg via ORAL
  Filled 2013-01-06 (×6): qty 1

## 2013-01-06 MED ORDER — DIGOXIN 125 MCG PO TABS
0.1250 mg | ORAL_TABLET | Freq: Every day | ORAL | Status: DC
  Administered 2013-01-06: 0.125 mg via ORAL
  Filled 2013-01-06: qty 1

## 2013-01-06 MED ORDER — FUROSEMIDE 10 MG/ML IJ SOLN
80.0000 mg | Freq: Two times a day (BID) | INTRAMUSCULAR | Status: DC
  Administered 2013-01-06: 80 mg via INTRAVENOUS
  Administered 2013-01-06: 40 mg via INTRAVENOUS
  Filled 2013-01-06 (×3): qty 8

## 2013-01-06 MED ORDER — ALENDRONATE SODIUM 70 MG PO TABS
70.0000 mg | ORAL_TABLET | ORAL | Status: DC
Start: 2013-01-06 — End: 2013-01-06

## 2013-01-06 MED ORDER — PHYTONADIONE 5 MG PO TABS
2.5000 mg | ORAL_TABLET | Freq: Once | ORAL | Status: AC
  Administered 2013-01-06: 2.5 mg via ORAL
  Filled 2013-01-06: qty 1

## 2013-01-06 MED ORDER — AMIODARONE LOAD VIA INFUSION
150.0000 mg | Freq: Once | INTRAVENOUS | Status: AC
  Administered 2013-01-06: 150 mg via INTRAVENOUS
  Filled 2013-01-06: qty 83.34

## 2013-01-06 MED ORDER — ACETAMINOPHEN 325 MG PO TABS
650.0000 mg | ORAL_TABLET | ORAL | Status: DC | PRN
  Administered 2013-01-07 – 2013-01-15 (×8): 650 mg via ORAL
  Filled 2013-01-06 (×8): qty 2

## 2013-01-06 MED ORDER — WARFARIN SODIUM 1 MG PO TABS
1.0000 mg | ORAL_TABLET | Freq: Once | ORAL | Status: AC
  Administered 2013-01-06: 1 mg via ORAL
  Filled 2013-01-06: qty 1

## 2013-01-06 MED ORDER — FOLIC ACID 1 MG PO TABS
1.0000 mg | ORAL_TABLET | Freq: Every day | ORAL | Status: DC
  Administered 2013-01-06 – 2013-01-16 (×11): 1 mg via ORAL
  Filled 2013-01-06 (×11): qty 1

## 2013-01-06 MED ORDER — VITAMIN D3 25 MCG (1000 UNIT) PO TABS
2000.0000 [IU] | ORAL_TABLET | Freq: Every day | ORAL | Status: DC
  Administered 2013-01-06 – 2013-01-16 (×11): 2000 [IU] via ORAL
  Filled 2013-01-06 (×11): qty 2

## 2013-01-06 MED ORDER — DIGOXIN 0.0625 MG HALF TABLET
0.0625 mg | ORAL_TABLET | Freq: Every day | ORAL | Status: DC
  Administered 2013-01-07 – 2013-01-14 (×8): 0.0625 mg via ORAL
  Filled 2013-01-06 (×10): qty 1

## 2013-01-06 MED ORDER — FUROSEMIDE 10 MG/ML IJ SOLN
40.0000 mg | Freq: Two times a day (BID) | INTRAMUSCULAR | Status: DC
  Administered 2013-01-06 (×2): 40 mg via INTRAVENOUS
  Filled 2013-01-06 (×3): qty 4

## 2013-01-06 MED ORDER — AMIODARONE HCL IN DEXTROSE 360-4.14 MG/200ML-% IV SOLN
60.0000 mg/h | INTRAVENOUS | Status: DC
  Administered 2013-01-06: 30 mg/h via INTRAVENOUS
  Administered 2013-01-07 (×2): 60 mg/h via INTRAVENOUS
  Administered 2013-01-07: 30 mg/h via INTRAVENOUS
  Administered 2013-01-08 (×3): 60 mg/h via INTRAVENOUS
  Filled 2013-01-06 (×13): qty 200

## 2013-01-06 MED ORDER — LOSARTAN POTASSIUM 50 MG PO TABS
50.0000 mg | ORAL_TABLET | Freq: Every day | ORAL | Status: DC
  Filled 2013-01-06: qty 1

## 2013-01-06 MED ORDER — GABAPENTIN 300 MG PO CAPS
300.0000 mg | ORAL_CAPSULE | Freq: Every day | ORAL | Status: DC
  Administered 2013-01-06 – 2013-01-15 (×11): 300 mg via ORAL
  Filled 2013-01-06 (×13): qty 1

## 2013-01-06 MED ORDER — NITROGLYCERIN IN D5W 200-5 MCG/ML-% IV SOLN
2.0000 ug/min | INTRAVENOUS | Status: DC
  Administered 2013-01-06: 10 ug/min via INTRAVENOUS
  Administered 2013-01-06: 20 ug/min via INTRAVENOUS
  Filled 2013-01-06: qty 250

## 2013-01-06 MED ORDER — METOPROLOL SUCCINATE ER 50 MG PO TB24
50.0000 mg | ORAL_TABLET | Freq: Two times a day (BID) | ORAL | Status: DC
  Filled 2013-01-06: qty 1

## 2013-01-06 MED ORDER — ATORVASTATIN CALCIUM 10 MG PO TABS
10.0000 mg | ORAL_TABLET | Freq: Every day | ORAL | Status: DC
  Administered 2013-01-06 – 2013-01-16 (×11): 10 mg via ORAL
  Filled 2013-01-06 (×11): qty 1

## 2013-01-06 MED ORDER — AMIODARONE HCL IN DEXTROSE 360-4.14 MG/200ML-% IV SOLN
INTRAVENOUS | Status: AC
  Administered 2013-01-06: 200 mL
  Filled 2013-01-06: qty 200

## 2013-01-06 MED ORDER — SODIUM CHLORIDE 0.9 % IJ SOLN: 3.0000 mL | INTRAMUSCULAR | Status: DC | PRN

## 2013-01-06 MED ORDER — HYDROXYCHLOROQUINE SULFATE 200 MG PO TABS
200.0000 mg | ORAL_TABLET | Freq: Every day | ORAL | Status: DC
  Administered 2013-01-06 – 2013-01-16 (×11): 200 mg via ORAL
  Filled 2013-01-06 (×11): qty 1

## 2013-01-06 MED ORDER — HEPARIN (PORCINE) IN NACL 100-0.45 UNIT/ML-% IJ SOLN
1100.0000 [IU]/h | INTRAMUSCULAR | Status: DC
  Administered 2013-01-06: 1100 [IU]/h via INTRAVENOUS
  Filled 2013-01-06 (×2): qty 250

## 2013-01-06 MED ORDER — AMIODARONE HCL IN DEXTROSE 360-4.14 MG/200ML-% IV SOLN
60.0000 mg/h | INTRAVENOUS | Status: AC
  Administered 2013-01-06: 60 mg/h via INTRAVENOUS
  Filled 2013-01-06: qty 200

## 2013-01-06 MED ORDER — WARFARIN - PHARMACIST DOSING INPATIENT: Freq: Every day | Status: DC

## 2013-01-06 MED ORDER — PREDNISONE 5 MG PO TABS
5.0000 mg | ORAL_TABLET | Freq: Every day | ORAL | Status: DC
  Administered 2013-01-06 – 2013-01-16 (×11): 5 mg via ORAL
  Filled 2013-01-06 (×11): qty 1

## 2013-01-06 MED ORDER — SODIUM CHLORIDE 0.9 % IJ SOLN
3.0000 mL | Freq: Two times a day (BID) | INTRAMUSCULAR | Status: DC
  Administered 2013-01-06 – 2013-01-07 (×3): 3 mL via INTRAVENOUS

## 2013-01-06 NOTE — Code Documentation (Addendum)
CODE BLUE NOTE  Patient Name: Marissa [name]   MRN: [id number]   Date of Birth/ Sex: [birth year] , female      Admission Date: 01/05/2013  Attending Provider: Gaylord Shih, MD  Primary Diagnosis: Non-ST elevation myocardial infarction (NSTEMI), initial care episode    Indication: Pt was in her usual state of health until this morning, when she was noted to be progressively SOB.  This afternoon, the patient was having more respiratory distress, and became unresponsive in Vtach around 1400. Code blue was subsequently called. CPR was initiated around 1402, the patient was subsequently shocked, and had return of circulation, with sinus bradycardia in the 40-50's.  The patient's HR then increased to the 60's.  No medications were administered.    Technical Description:  - CPR performance duration:  3  minutes  - Was defibrillation or cardioversion used? Yes   - Was external pacer placed? No  - Was patient intubated pre/post CPR? No    Medications Administered: Y = Yes; Blank = No Amiodarone    Atropine    Calcium    Epinephrine    Lidocaine    Magnesium    Norepinephrine    Phenylephrine    Sodium bicarbonate    Vasopressin    No medications were administered  Post CPR evaluation:  - Final Status - Was patient successfully resuscitated ? Yes - What is current rhythm? Sinus rhythm, HR in 60's - What is current hemodynamic status? Stable   Miscellaneous Information:  - Labs sent, including: None  - Primary team notified?  Yes, Dr. Sharon Seller at bedside  - Family Notified? Yes, Daughter at bedside  - Additional notes/ transfer status: Patient currently in cardiac ICU.  [facility] present to further evaluate patient.       Simone Curia, Family Medicine, PGY1  Linward Headland, MD, PGY2 01/06/2013, 2:12 PM

## 2013-01-06 NOTE — Progress Notes (Signed)
  Echocardiogram 2D Echocardiogram has been performed.  [place]ian Co 01/06/2013, 5:06 PM

## 2013-01-06 NOTE — Progress Notes (Signed)
Pt had a 3.66 pause, converted to Junctional rhythm  HR 46-53  MD paged. We will inform him ASAP.

## 2013-01-06 NOTE — Progress Notes (Signed)
Utilization Review Completed. 01/06/2013  

## 2013-01-06 NOTE — Progress Notes (Signed)
ANTICOAGULATION CONSULT NOTE - Initial Consult  Pharmacy Consult for Coumadin > heparin  Indication: atrial fibrillation  Allergies  Allergen Reactions  . Codeine Other (See Comments)    Makes patient sleepy.  Marland Kitchen Penicillins Rash    Patient Measurements: Height: 5\' 5"  (165.1 cm) Weight: 172 lb 9.9 oz (78.3 kg) IBW/kg (Calculated) : 57  Vital Signs: Temp: 98.9 F (37.2 C) (06/30 0733) Temp src: Oral (06/30 0733) BP: 102/77 mmHg (06/30 1010) Pulse Rate: 53 (06/30 1010)  Labs:  Recent Labs  01/05/13 1903 01/06/13 0028 01/06/13 0713 01/06/13 0714  HGB 12.9  --   --   --   HCT 40.3  --   --   --   PLT 223  --   --   --   LABPROT 30.2*  --  27.8*  --   INR 3.02*  --  2.71*  --   CREATININE 1.77* 1.53* 1.59*  --   TROPONINI <0.30 19.14*  --  >20.00*    Estimated Creatinine Clearance: 30.6 ml/min (by C-G formula based on Cr of 1.59).   Medical History: Past Medical History  Diagnosis Date  . Atrial fibrillation     A.  Chronic Coumadin  . MVP (mitral valve prolapse)   . Hypertension   . Nonischemic cardiomyopathy 2001    A.  07/23/11 - Echo: EF 35%;  B. 07/24/2011 - Cath: nonobs dzs ef 35%, 2+MR   . Systolic CHF, chronic     A. Diag 07/2011, EF 35%  . Lumbar disc disease     laminectomy 2001  . History of endometrial cancer   . Hypertension   . Hyperlipidemia   . Rheumatoid arthritis     treated in past with Remicade and developed vasculitis  . Gout     Medications:  Scheduled:  . sodium chloride   Intravenous STAT  . atorvastatin  10 mg Oral Daily  . cholecalciferol  2,000 Units Oral Daily  . digoxin  0.125 mg Oral Daily  . folic acid  1 mg Oral Daily  . furosemide  80 mg Intravenous BID  . gabapentin  300 mg Oral QHS  . hydroxychloroquine  200 mg Oral Daily  . metoprolol succinate  50 mg Oral BID WC  . potassium chloride  20 mEq Oral BID  . predniSONE  5 mg Oral Daily  . sodium chloride  3 mL Intravenous Q12H  . Warfarin - Pharmacist Dosing  Inpatient   Does not apply q1800    Assessment: 79 yo female presented with chest pain and shortness of breath. Pharmacy consulted to dose warfarin, now to start heparin for NSTEMI with troponin > 20 in spite of a therapeutic INR  PMH: Afib, ACS, HF  Home warfarin: 5mg  daily, except 2.5mg  on Mondays with last dose 6/28.  INR (3.02) on admission  Coag: NSTEMI to start heparin> No bolus given INR at goal, will start 1100 units/hr  CV: NSTEMI, AFib, HF: ongoing cp Nitro, heparin, atorva, furo 80 iv bid, metop 50 bid, K 20 bid (K 4.5),    Goal of Therapy:  Heparin level 0.3-0.7 INR 2-3 Monitor platelets by anticoagulation protocol: Yes   Plan:  1. Heparin 1100 units/hr 2. Check heparin level 8 h after ggt starts unless otherwise clinically indicated 3. Daily heparin level, CBC 4. Follow up plans to resume warfarin  Thank you for allowing pharmacy to be a part of this patients care team.  Lovenia Kim Pharm.D., BCPS Clinical Pharmacist 01/06/2013 10:36 AM Pager: [phone] Phone: [phone]

## 2013-01-06 NOTE — Care Management Note (Addendum)
    Page 1 of 2   01/09/2013     11:39:51 AM   CARE MANAGEMENT NOTE 01/09/2013  Patient:  Marissa [name], Marissa [name]   Account Number:  [id number]  Date Initiated:  01/06/2013  Documentation initiated by:  Junius Creamer  Subjective/Objective Assessment:   adm w mi     Action/Plan:   lives alone, pcp dr Channing Mutters fagan   Anticipated DC Date:     Anticipated DC Plan:  HOME W HOME HEALTH SERVICES      DC Planning Services  CM consult      [facility] Choice  Resumption Of Svcs/PTA Provider   Choice offered to / List presented to:          [facility] arranged  HH-10 DISEASE MANAGEMENT  HH-1 RN      [facility] agency  [facility]   Status of service:   Medicare Important Message given?   (If response is "NO", the following Medicare IM given date fields will be blank) Date Medicare IM given:   Date Additional Medicare IM given:    Discharge Disposition:  HOME W HOME HEALTH SERVICES  Per UR Regulation:  Reviewed for med. necessity/level of care/duration of stay  If discussed at Long Length of Stay Meetings, dates discussed:    Comments:  7/3  1138 debbie Jerlene Rockers rn,bsn pt followed by hhrn alisa w [facility]). nse follows 1x per month.  6/30 11:18 debbie Zarie Kosiba rn,bsn will moniter for dc needs as pt progresses.

## 2013-01-06 NOTE — Progress Notes (Signed)
Troponin critical value reported to pt's primary RN.

## 2013-01-06 NOTE — Progress Notes (Signed)

## 2013-01-06 NOTE — Progress Notes (Signed)
Patient Name: Marissa [name] Date of Encounter: 01/06/2013  Principal Problem:   Non-ST elevation myocardial infarction (NSTEMI), initial care episode Active Problems:   Chronic atrial fibrillation   Chronic anticoagulation   Nonischemic cardiomyopathy   Cellulitis of right leg   Chest pain at rest   Acute on chronic systolic congestive heart failure    SUBJECTIVE: Chest tightness since last pm, 9/10 at worst. Never a 0/10, currently 5/10. Not on nitrates. Breathing better since getting IV Lasix.   OBJECTIVE Filed Vitals:   01/06/13 0100 01/06/13 0115 01/06/13 0330 01/06/13 0733  BP: 176/74 164/100 147/70 146/67  Pulse: 63 51 53 55  Temp:    98.9 F (37.2 C)  TempSrc:    Oral  Resp: 17 19 31 20   Height:      Weight:      SpO2: 97% 100% 97% 97%    Intake/Output Summary (Last 24 hours) at 01/06/13 0942 Last data filed at 01/06/13 0734  Gross per 24 hour  Intake    120 ml  Output    650 ml  Net   -530 ml   Filed Weights   01/05/13 1900 01/05/13 2356  Weight: 173 lb (78.472 kg) 172 lb 9.9 oz (78.3 kg)    PHYSICAL EXAM General: Well developed, well nourished, female in no acute distress. Head: Normocephalic, atraumatic.  Neck: Supple without bruits, JVD 9 cm. Lungs:  Resp regular and unlabored, rales bases, some crackles. Heart: RRR, S1, S2, no S3, S4, 3/6 murmur; no rub. Abdomen: Soft, non-tender, non-distended, BS + x 4.  Extremities: No clubbing, cyanosis, no edema. Wound on back of right calf bandaged, not disturbed. Neuro: Alert and oriented X 3. Moves all extremities spontaneously. Psych: Normal affect.  LABS: CBC:  Recent Labs  01/05/13 1903  WBC 8.1  NEUTROABS 5.2  HGB 12.9  HCT 40.3  MCV 98.1  PLT 223   INR:  Recent Labs  01/06/13 0713  INR 2.71*   Basic Metabolic Panel:  Recent Labs  16/10/96 0028 01/06/13 0713  NA 142 146*  K 3.9 4.5  CL 104 103  CO2 30 30  GLUCOSE 95 131*  BUN 31* 29*  CREATININE 1.53* 1.59*  CALCIUM 9.6  9.5   Liver Function Tests:  Recent Labs  01/06/13 0028  AST 69*  ALT 24  ALKPHOS 53  BILITOT 0.4  PROT 7.1  ALBUMIN 3.5   Cardiac Enzymes:  Recent Labs  01/05/13 1903 01/06/13 0028 01/06/13 0714  TROPONINI <0.30 19.14* >20.00*    BNP: Pro B Natriuretic peptide (BNP)  Date/Time Value Range Status  01/05/2013  7:03 PM 3772.0* 0 - 450 pg/mL Final  08/03/2012  8:40 AM 10951.0* 0 - 450 pg/mL Final   TELE: Junctional   ECG: Junctional bradycardia, 54, inferolateral ST changes from January  Radiology/Studies: Dg Chest Portable 1 View 01/05/2013   *RADIOLOGY REPORT*  Clinical Data: Shortness of breath and chest pain.  PORTABLE CHEST - 1 VIEW  Comparison: PA and lateral chest 07/23/2011 and 08/03/2012.  Findings: There is cardiomegaly and mild interstitial edema.  Trace left pleural effusion is noted.  Chronic, mild elevation of the right hemidiaphragm relative to the left is noted.  No consolidative process or pneumothorax identified.  IMPRESSION: Cardiomegaly and mild interstitial edema with a small left effusion.   Original Report Authenticated By: Holley Dexter, M.D.   Cath January 2013 Coronary dominance: right  Left mainstem: Widely patent common divides into the LAD, intermediate branch, and left circumflex.  Left anterior descending (LAD): Tortuous vessel, widely patent to the left ventricular apex. No significant obstructive disease.  Left circumflex (LCx): Widely patent throughout no significant obstructive disease.  Right coronary artery (RCA): There is a mild narrowing in the proximal vessel at the area of tortuosity. This is clearly nonobstructive no more than 20% stenosis is present. The vessel is dominant and gives off a PDA and a posterolateral branch without significant obstruction.  Left ventriculography: There is severe LV dysfunction with LVEF estimated at 35%. There appears to be at least 2+ mitral regurgitation.   Echo Jan 2014 Study Conclusions - Left  ventricle: The cavity size was moderately dilated. Wall thickness was normal. Systolic function was severely reduced. The estimated ejection fraction was in the range of 15% to 20%. Severeglobal hypokinesis. - Aortic valve: Cusp separation was mildly reduced. There was mild stenosis. Valve area: 1.62cm^2(VTI). Valve area: 1.47cm^2 (Vmax). Mean gradient 7 mm Hg - Mitral valve: Mild regurgitation. - Left atrium: The atrium was moderately to severely dilated. - Right atrium: The atrium was severely dilated. - Atrial septum: No defect or patent foramen ovale was identified. - Pericardium, extracardiac: A trivial pericardial effusion was identified posterior to the heart. Impressions: - Compared to the previous study performed 07/23/2011, there has been substantial further deterioration in LV systolic function, progress LV dilatation, development of mild aortic stenosis, and an impressive increase in left and right atrial size. Transthoracic echocardiography  Current Medications:  . sodium chloride   Intravenous STAT  . atorvastatin  10 mg Oral Daily  . cholecalciferol  2,000 Units Oral Daily  . digoxin  0.125 mg Oral Daily  . folic acid  1 mg Oral Daily  . furosemide  40 mg Intravenous BID  . gabapentin  300 mg Oral QHS  . hydroxychloroquine  200 mg Oral Daily  . losartan  50 mg Oral Daily  . metoprolol succinate  50 mg Oral BID WC  . potassium chloride  20 mEq Oral BID  . predniSONE  5 mg Oral Daily  . sodium chloride  3 mL Intravenous Q12H  . Warfarin - Pharmacist Dosing Inpatient   Does not apply q1800   . nitroGLYCERIN      ASSESSMENT AND PLAN: Principal Problem:   Non-ST elevation myocardial infarction (NSTEMI), initial care episode - pt is having ongoing chest pain. Add nitrates, Tx ICU, MD to review ASAP and advise on cath today.  Active Problems:   Chronic atrial fibrillation - currently junctional brady - BB held   Chronic anticoagulation - INR therapeutic,  coumadin held    Nonischemic cardiomyopathy - EF 15-20 % Jan 2014   Cellulitis of right leg - wound care consult   Acute on chronic systolic congestive heart failure - on Lasix 40 mg IV BID. Has had 1 dose so far.   Renal insufficiency - worse this admit than previous values with Cr 1.59, was 1.22 in January. Follow.   NSVT - Ck Mg, follow.   SignedTheodore Demark , PA-C 9:42 AM 01/06/2013 Patient seen and examined and history reviewed. Agree with above findings and plan. 79 yo WF with history of nonischemic cardiomyopathy presents now with NSTEMI. Troponins are significantly elevated. Ecg shows ST-T depression in the lateral leads. Her chest pain was severe last pm and has improved today. She is acutely volume overloaded with JVD to the jaw and bilateral rales throughout her pulmonary exam. She has new renal insufficiency and is fully anticoagulated on Coumadin. We discussed cardiac cath today  but given the above factors I think urgent cardiac cath is very risky. I recommend transfer to [facility] for IV diuresis. Will hold coumadin. Monitor renal function closely. Start Heparin when INR less than 2. Will repeat Echo today. Cardiac cath is indicated when her condition improves. If her chest pain worsens then we may have to do this acutely but risk would be much higher.  Theron Arista [facility]  01/06/2013 10:32 AM

## 2013-01-06 NOTE — Progress Notes (Signed)
Dr Donnie Aho was made aware of Troponin level 19.14.

## 2013-01-06 NOTE — Progress Notes (Signed)
ANTICOAGULATION CONSULT NOTE - Initial Consult  Pharmacy Consult for Coumadin  Indication: atrial fibrillation  Allergies  Allergen Reactions  . Codeine Other (See Comments)    Makes patient sleepy.  Marland Kitchen Penicillins Rash    Patient Measurements: Height: 5\' 5"  (165.1 cm) Weight: 173 lb (78.472 kg) IBW/kg (Calculated) : 57  Vital Signs: Temp: 98.4 F (36.9 C) (06/29 2330) Temp src: Oral (06/29 2330) BP: 151/62 mmHg (06/29 2211) Pulse Rate: 85 (06/29 2211)  Labs:  Recent Labs  01/05/13 1903  HGB 12.9  HCT 40.3  PLT 223  LABPROT 30.2*  INR 3.02*  CREATININE 1.77*  TROPONINI <0.30    Estimated Creatinine Clearance: 27.6 ml/min (by C-G formula based on Cr of 1.77).   Medical History: Past Medical History  Diagnosis Date  . Atrial fibrillation     A.  Chronic Coumadin  . MVP (mitral valve prolapse)   . Hypertension   . Nonischemic cardiomyopathy 2001    A.  07/23/11 - Echo: EF 35%;  B. 07/24/2011 - Cath: nonobs dzs ef 35%, 2+MR   . Systolic CHF, chronic     A. Diag 07/2011, EF 35%  . Lumbar disc disease     laminectomy 2001  . History of endometrial cancer   . Hypertension   . Hyperlipidemia   . Rheumatoid arthritis     treated in past with Remicade and developed vasculitis  . Gout     Medications:  Scheduled:  . sodium chloride   Intravenous STAT  . atorvastatin  10 mg Oral Daily  . cholecalciferol  2,000 Units Oral Daily  . digoxin  0.125 mg Oral Daily  . folic acid  1 mg Oral Daily  . furosemide  40 mg Intravenous Q12H  . gabapentin  300 mg Oral QHS  . hydroxychloroquine  200 mg Oral Daily  . losartan  50 mg Oral Daily  . metoprolol succinate  50 mg Oral BID WC  . morphine      . potassium chloride  20 mEq Oral BID  . predniSONE  5 mg Oral Daily  . sodium chloride  3 mL Intravenous Q12H    Assessment: 79 yo female presented with chest pain and shortness of breath. Patient with h/o atrial fibrillation on Coumadin 5mg  daily, except 2.5mg  on  Mondays with last dose 6/28. Pharmacy to manage Coumadin. INR (3.02) is slightly-above goal.  Goal of Therapy:  INR 2-3 Monitor platelets by anticoagulation protocol: Yes   Plan:  1. Coumadin 1mg  x 1.  2. Daily PT / INR 3. Coumadin education with pharmacist.  Emeline Gins 01/06/2013,12:05 AM

## 2013-01-06 NOTE — Progress Notes (Signed)
Anticoagulation Consult per Pharmacy; Heparin.  Heparin level = 0.22 on 1100 units/hr Goal heparin level = 0.3-0.7  Heparin level is a little subtherapeutic. Will increase rate to 1200 units/hr and f/u level and CBC in the AM.  Cardell Peach, PharmD

## 2013-01-06 NOTE — Progress Notes (Addendum)
   S: Called to see patient secondary to VT arrest.  Per RN report, pt c/o dyspnea and began to experience NSVT.  She then went into pulseless VT with torsades-like appearance.  She was shocked x 1 with conversion to what initially appeared to be a jxnl rhythm, though now she is in sinus (admitted in afib in setting of chronic afib).  She currently is awake and alert w/o complaints.  O:   Filed Vitals:   01/06/13 1300  BP: 136/63  Pulse: 55  Temp:   Resp: 28  Pleasant, nad, aaox3. Neck - jvp to jaw. Lungs, bibasilar crackles, Cor RRR, Abd soft/nt/nd/bs+x4.    Mg 2.4 @ 13:13 Bmet pending  A/P: 1.  VT Arrest:  In setting of NSTEMI with markedly elevated troponins and previously known NICM.  Plan is for cath during this admission though this has been put on hold in setting of volume overload with need for diuresis, mild renal insuff, and elevated INR (2.71) in setting of chronic coumadin.  She is currently pain free.  Mg is nl and repeat bmet is pending.  Will load with IV amio and give Vit K as she will require cath in AM or sooner if she has recurrent arrhythmias, worsening dyspnea, or chest pain.  2. NICM/Acute on chronic systolic CHF:  Continue IV diuresis.  F/u bmet now.  Nicolasa Ducking, NP  Patient seen with NP, agree with the above note.  She went into VT (with torsades appearance) and had to be cardioverted.  She is now in a junctional rhythm alternating with sinus brady in the 50s after the cardioversion.  Prior to this she had been in chronic atrial fibrillation.  She has NSTEMI in setting of prior nonischemic cardiomyopathy.  Mg normal, K pending.   - Loading with amiodarone IV - To get vitamin K po in preparation for [facility] tomorrow (INR 2.7).  Will keep NPO after midnight.   - Continue diuresis.  35 min critical care time  Marca Ancona 01/06/2013 3:46 PM

## 2013-01-06 NOTE — Progress Notes (Signed)
Order received from Dr Donnie Aho to hold AM Lopressor.  147/70 HR 51 RR 22 O 2 Sat. 100 %. We will continue to monitor.

## 2013-01-06 NOTE — Progress Notes (Signed)
Chaplain responded to a code blue page and reported to 2902. Pt got her pulse back immediately upon chaplain's arrival. Chaplain provided ministry of presence. Kelle Darting [phone]  01/06/13 1430  Clinical Encounter Type  Visited With Patient and family together  Visit Type Initial;Spiritual support;Social support;Code;Critical Care  Referral From Nurse  Spiritual Encounters  Spiritual Needs Other (Comment) (ministry of presence)  Stress Factors  Patient Stress Factors Not reviewed  Family Stress Factors Not reviewed

## 2013-01-07 ENCOUNTER — Inpatient Hospital Stay (HOSPITAL_COMMUNITY): Admission: RE | Admit: 2013-01-07 | Payer: Medicare Other | Source: Ambulatory Visit | Admitting: *Deleted

## 2013-01-07 ENCOUNTER — Inpatient Hospital Stay (HOSPITAL_COMMUNITY): Payer: Medicare Other

## 2013-01-07 DIAGNOSIS — I472 Ventricular tachycardia, unspecified: Secondary | ICD-10-CM | POA: Diagnosis not present

## 2013-01-07 LAB — CBC
HCT: 36.2 % (ref 36.0–46.0)
MCHC: 31.8 g/dL (ref 30.0–36.0)
MCV: 97.1 fL (ref 78.0–100.0)
RDW: 15.3 % (ref 11.5–15.5)

## 2013-01-07 LAB — URINALYSIS, ROUTINE W REFLEX MICROSCOPIC
Bilirubin Urine: NEGATIVE
Protein, ur: NEGATIVE mg/dL
Urobilinogen, UA: 1 mg/dL (ref 0.0–1.0)

## 2013-01-07 LAB — BASIC METABOLIC PANEL
BUN: 32 mg/dL — ABNORMAL HIGH (ref 6–23)
Calcium: 9 mg/dL (ref 8.4–10.5)
Creatinine, Ser: 1.66 mg/dL — ABNORMAL HIGH (ref 0.50–1.10)
GFR calc Af Amer: 33 mL/min — ABNORMAL LOW (ref >90)
GFR calc non Af Amer: 29 mL/min — ABNORMAL LOW (ref >90)
Potassium: 4.1 mEq/L (ref 3.5–5.1)

## 2013-01-07 LAB — URINE MICROSCOPIC-ADD ON

## 2013-01-07 MED ORDER — TRAMADOL HCL 50 MG PO TABS
50.0000 mg | ORAL_TABLET | Freq: Four times a day (QID) | ORAL | Status: DC | PRN
  Administered 2013-01-07 – 2013-01-13 (×10): 50 mg via ORAL
  Filled 2013-01-07 (×11): qty 1

## 2013-01-07 MED ORDER — PHYTONADIONE 5 MG PO TABS
5.0000 mg | ORAL_TABLET | Freq: Once | ORAL | Status: AC
  Administered 2013-01-07: 5 mg via ORAL
  Filled 2013-01-07: qty 1

## 2013-01-07 MED ORDER — SODIUM CHLORIDE 0.9 % IJ SOLN: 3.0000 mL | INTRAMUSCULAR | Status: DC | PRN

## 2013-01-07 MED ORDER — SODIUM CHLORIDE 0.9 % IJ SOLN
3.0000 mL | Freq: Two times a day (BID) | INTRAMUSCULAR | Status: DC
  Administered 2013-01-08: 3 mL via INTRAVENOUS

## 2013-01-07 MED ORDER — FUROSEMIDE 10 MG/ML IJ SOLN
80.0000 mg | Freq: Once | INTRAMUSCULAR | Status: AC
  Administered 2013-01-07: 80 mg via INTRAVENOUS

## 2013-01-07 MED ORDER — SODIUM CHLORIDE 0.9 % IV SOLN: 250.0000 mL | INTRAVENOUS | Status: DC | PRN

## 2013-01-07 MED ORDER — HEPARIN (PORCINE) IN NACL 100-0.45 UNIT/ML-% IJ SOLN
1500.0000 [IU]/h | INTRAMUSCULAR | Status: DC
  Administered 2013-01-07: 1300 [IU]/h via INTRAVENOUS
  Filled 2013-01-07 (×3): qty 250

## 2013-01-07 MED ORDER — AMIODARONE LOAD VIA INFUSION
150.0000 mg | Freq: Once | INTRAVENOUS | Status: AC
  Administered 2013-01-07: 150 mg via INTRAVENOUS
  Filled 2013-01-07: qty 83.34

## 2013-01-07 MED ORDER — CIPROFLOXACIN HCL 250 MG PO TABS
250.0000 mg | ORAL_TABLET | Freq: Two times a day (BID) | ORAL | Status: DC
  Administered 2013-01-07 – 2013-01-08 (×3): 250 mg via ORAL
  Filled 2013-01-07 (×6): qty 1

## 2013-01-07 MED ORDER — MORPHINE SULFATE 2 MG/ML IJ SOLN
1.0000 mg | INTRAMUSCULAR | Status: DC | PRN
  Administered 2013-01-07 – 2013-01-09 (×2): 1 mg via INTRAVENOUS
  Filled 2013-01-07 (×2): qty 1

## 2013-01-07 MED ORDER — ASPIRIN 81 MG PO CHEW
324.0000 mg | CHEWABLE_TABLET | ORAL | Status: AC
  Administered 2013-01-08: 324 mg via ORAL
  Filled 2013-01-07: qty 4

## 2013-01-07 MED ORDER — SODIUM CHLORIDE 0.9 % IV SOLN: INTRAVENOUS | Status: DC

## 2013-01-07 NOTE — Progress Notes (Signed)
 Patient Name: Marissa [name] Date of Encounter: 01/07/2013  Principal Problem:   Non-ST elevation myocardial infarction (NSTEMI), initial care episode Active Problems:   Chronic atrial fibrillation   Chronic anticoagulation   Nonischemic cardiomyopathy   Cellulitis of right leg   Acute on chronic systolic congestive heart failure    SUBJECTIVE: Pt not having any more chest pain. Chest is  sore, hurts to take a deep breath.  OBJECTIVE Filed Vitals:   01/07/13 0400 01/07/13 0500 01/07/13 0600 01/07/13 0700  BP: 136/55 126/36 109/42 115/47  Pulse: 45 50 48 49  Temp: 100 F (37.8 C)     TempSrc: Oral     Resp: 17 17 17 16  Height:      Weight:  172 lb 6.4 oz (78.2 kg)    SpO2: 98% 100% 99% 99%    Intake/Output Summary (Last 24 hours) at 01/07/13 0719 Last data filed at 01/07/13 0500  Gross per 24 hour  Intake  966.8 ml  Output   1800 ml  Net -833.2 ml   Filed Weights   01/05/13 2356 01/06/13 1100 01/07/13 0500  Weight: 172 lb 9.9 oz (78.3 kg) 172 lb 9.9 oz (78.3 kg) 172 lb 6.4 oz (78.2 kg)    PHYSICAL EXAM General: Well developed, well nourished, female in no acute distress. Head: Normocephalic, atraumatic.  Neck: Supple without bruits, JVD 6 cm. Lungs:  Resp regular and unlabored, decreased breath sound bases Heart: RRR, S1, S2, no S3, S4, 2/6 murmur; no rub. Abdomen: Soft, non-tender, non-distended, BS + x 4.  Extremities: No clubbing, cyanosis, no edema.  Neuro: Alert and oriented X 3. Moves all extremities spontaneously. Psych: Normal affect.  LABS: CBC: Recent Labs  01/05/13 1903 01/07/13 0430  WBC 8.1 10.7*  NEUTROABS 5.2  --   HGB 12.9 11.5*  HCT 40.3 36.2  MCV 98.1 97.1  PLT 223 163   INR: Recent Labs  01/07/13 0430  INR 2.36*   Basic Metabolic Panel: Recent Labs  01/06/13 1313 01/06/13 1703 01/07/13 0430  NA  --  143 143  K  --  4.3 4.1  CL  --  103 103  CO2  --  30 34*  GLUCOSE  --  145* 107*  BUN  --  29* 32*  CREATININE  --   1.50* 1.66*  CALCIUM  --  9.3 9.0  MG 2.4  --   --    Liver Function Tests: Recent Labs  01/06/13 0028  AST 69*  ALT 24  ALKPHOS 53  BILITOT 0.4  PROT 7.1  ALBUMIN 3.5   Cardiac Enzymes: Recent Labs  01/06/13 0028 01/06/13 0714 01/06/13 1314  TROPONINI 19.14* >20.00* >20.00*    BNP: Pro B Natriuretic peptide (BNP)  Date/Time Value Range Status  01/05/2013  7:03 PM 3772.0* 0 - 450 pg/mL Final  08/03/2012  8:40 AM 10951.0* 0 - 450 pg/mL Final   TELE:  SR/Junct rhythm, multiple episodes of NSVT up to 10 beats at a time   ECG: Junctional rhythm, 54, inferolateral ST/T wave changes, no significant change from 6/30  2D ECHO: 01/06/2013 Study Conclusions - Left ventricle: The cavity size was mildly dilated. Wall thickness was increased in a pattern of mild LVH. Systolic function was mildly to moderately reduced. The estimated ejection fraction was in the range of 40% to 45%. Diffuse hypokinesis. There is hypokinesis of the anteroseptal myocardium. - Mitral valve: Calcified annulus. Mild regurgitation. - Left atrium: The atrium was moderately dilated. 51 mm -   Right ventricle: The cavity size was mildly dilated. - Right atrium: The atrium was moderately to severely dilated. - Tricuspid valve: Moderate regurgitation. - Pulmonary arteries: PA peak pressure: 54mm Hg (S).  Radiology/Studies: Dg Chest Portable 1 View 01/05/2013   *RADIOLOGY REPORT*  Clinical Data: Shortness of breath and chest pain.  PORTABLE CHEST - 1 VIEW  Comparison: PA and lateral chest 07/23/2011 and 08/03/2012.  Findings: There is cardiomegaly and mild interstitial edema.  Trace left pleural effusion is noted.  Chronic, mild elevation of the right hemidiaphragm relative to the left is noted.  No consolidative process or pneumothorax identified.  IMPRESSION: Cardiomegaly and mild interstitial edema with a small left effusion.   Original Report Authenticated By: Thomas D'Alessio, M.D.     Current  Medications:  . atorvastatin  10 mg Oral Daily  . cholecalciferol  2,000 Units Oral Daily  . digoxin  0.0625 mg Oral Daily  . folic acid  1 mg Oral Daily  . furosemide  80 mg Intravenous BID  . gabapentin  300 mg Oral QHS  . hydroxychloroquine  200 mg Oral Daily  . metoprolol succinate  50 mg Oral BID WC  . potassium chloride  20 mEq Oral BID  . predniSONE  5 mg Oral Daily  . sodium chloride  3 mL Intravenous Q12H   . amiodarone (NEXTERONE PREMIX) 360 mg/200 mL dextrose 30 mg/hr (01/06/13 2100)  . heparin 1,200 Units/hr (01/06/13 2212)  . nitroGLYCERIN 10 mcg/min (01/06/13 1600)    ASSESSMENT AND PLAN: Principal Problem:   Non-ST elevation myocardial infarction (NSTEMI), initial care episode - cath when medically stable. Pain-free now on current Rx, keep NPO in case cath is today.  Sustained VT - s/p arrest yest with pulseless VT, shocked x 1, on amio but still with NSVT, will re-bolus and increase gtt to 60 mg/hr for now.  Active Problems:   Chronic atrial fibrillation - afib -> VT yest, shock -> SR, now junctional   Chronic anticoagulation - s/p Vit K 2.5 mg po x 1 yest, INR trending down.   Nonischemic cardiomyopathy - EF yest 40-45 % post-arrest   Cellulitis of right leg - will have WOC RN to see   Acute on chronic systolic congestive heart failure - Lasix 80 mg IV BID, 2 doses yest. Hold until cath decision made.   Acute on chronic renal insuff Stage 3 - Cr increased with diuresis and MI, follow    Plan: increase Amio, keep NPO for now and hold Lasix till MD sees. Keep in CCU. WOC consult and recheck CXR.  Signed, Rhonda Barrett , PA-C 7:19 AM 01/07/2013 Patient seen and examined and history reviewed. Agree with above findings and plan. Patient had an episode of sustained VT yesterday requiring defibrillation. Now on IV amiodarone with NSVT. No chest pain. Breathing much improved today. Lungs clearer. JVD down. EF actually improved compared to 1/13 and 1/14. Renal function  stable-appears to have plateaued. Clearly needs cardiac cath. Prior study showed nonobstructive disease. INR still high. Patient does not want radial cath done. She had significant bruising, swelling, and pain after her last radial cath and had to see vascular surgery. In light of this I think it will be best to wait until tomorrow to do cath. We will give vitamin K po today. Hold metoprolol due to bradycardia. Give lasix IV this am but hold pm dose and recheck renal function and INR in AM. Plan cardiac cath tomorrow. Patient in agreement with this plan.  Peytan Andringa JordanMD,FACC   01/07/2013 1:19 PM    

## 2013-01-07 NOTE — Progress Notes (Signed)
ANTICOAGULATION CONSULT NOTE - Initial Consult  Pharmacy Consult for Coumadin > heparin  Indication: atrial fibrillation  Allergies  Allergen Reactions  . Codeine Other (See Comments)    Makes patient sleepy.  Marland Kitchen Penicillins Rash    Patient Measurements: Height: 5\' 5"  (165.1 cm) Weight: 172 lb 6.4 oz (78.2 kg) IBW/kg (Calculated) : 57  Vital Signs: Temp: 99.1 F (37.3 C) (07/01 0800) Temp src: Oral (07/01 0800) BP: 116/39 mmHg (07/01 1000) Pulse Rate: 47 (07/01 1000)  Labs:  Recent Labs  01/05/13 1903 01/06/13 0028 01/06/13 0713 01/06/13 0714 01/06/13 1314 01/06/13 1703 01/06/13 2000 01/07/13 0430 01/07/13 1035  HGB 12.9  --   --   --   --   --   --  11.5*  --   HCT 40.3  --   --   --   --   --   --  36.2  --   PLT 223  --   --   --   --   --   --  163  --   LABPROT 30.2*  --  27.8*  --   --   --   --  25.0*  --   INR 3.02*  --  2.71*  --   --   --   --  2.36*  --   HEPARINUNFRC  --   --   --   --   --   --  0.22*  --  0.27*  CREATININE 1.77* 1.53* 1.59*  --   --  1.50*  --  1.66*  --   TROPONINI <0.30 19.14*  --  >20.00* >20.00*  --   --   --   --     Estimated Creatinine Clearance: 29.3 ml/min (by C-G formula based on Cr of 1.66).   Assessment: 79 yo female presented with chest pain and shortness of breath. Pharmacy consulted to dose warfarin, now to start heparin for NSTEMI with troponin > 20 in spite of a therapeutic INR.   Patient continues on IV heparin gtt with level slightly below goal, however INR continues to be therapeutic at 2.3 despite 2.5mg  of vitamin k yesterday. No bleeding issues noted. Hesitate to be too aggressive with heparin given elevated INR, will make small rate change and follow up HL in am.  Goal of Therapy:  Heparin level 0.3-0.7 INR 2-3 Monitor platelets by anticoagulation protocol: Yes   Plan:  1. Increase Heparin to 1200 units/hr 2. Daily heparin level, CBC 4. Follow up plans to resume warfarin  Thank you for allowing  pharmacy to be a part of this patients care team.  Sheppard Coil PharmD., BCPS Clinical Pharmacist Pager [phone] 01/07/2013 11:18 AM

## 2013-01-07 NOTE — Consult Note (Signed)
WOC consult Note Reason for Consult: Consult requested for right leg wound.  Pt is followed as an outpatient for wound care at [facility] for a chronic wound. Wound type: Full thickness stasis ulcer Measurement:3.5X1.5X.2cm and 2.5X1X.2cm Wound bed: 10% red, 90% yellow Drainage (amount, consistency, odor) mod yellow drainage, no odor, very painful to touch Periwound: Intact skin surrounding Dressing procedure/placement/frequency: Continue present plan of care with Aquacel for antimicrobial benefits and to absorb drainage and light compression with ace wrap.  Dressings are changed 2X week according to patient.  Bedside nurses can change on Tues and Thursday. Pt can resume follow-up with [facility] after discharge. Please re-consult if further assistance is needed.  Thank-you,  Cammie Mcgee MSN, RN, CWOCN, [place], CNS [phone]

## 2013-01-07 NOTE — Progress Notes (Signed)
MD paged about pt's frequent bursts of Vtach; will continue to monitor closely and update as needed

## 2013-01-08 ENCOUNTER — Encounter (HOSPITAL_COMMUNITY)
Admission: EM | Disposition: A | Discharge status: Home Health | Payer: Self-pay | Source: Home / Self Care | Attending: Cardiology

## 2013-01-08 DIAGNOSIS — I2510 Atherosclerotic heart disease of native coronary artery without angina pectoris: Secondary | ICD-10-CM

## 2013-01-08 HISTORY — PX: CARDIAC CATHETERIZATION: SHX172

## 2013-01-08 HISTORY — PX: LEFT HEART CATHETERIZATION WITH CORONARY ANGIOGRAM: SHX5451

## 2013-01-08 LAB — CBC
HCT: 34.7 % — ABNORMAL LOW (ref 36.0–46.0)
MCV: 95.9 fL (ref 78.0–100.0)
Platelets: 147 10*3/uL — ABNORMAL LOW (ref 150–400)
RBC: 3.62 MIL/uL — ABNORMAL LOW (ref 3.87–5.11)
WBC: 10.1 10*3/uL (ref 4.0–10.5)

## 2013-01-08 LAB — BASIC METABOLIC PANEL
BUN: 30 mg/dL — ABNORMAL HIGH (ref 6–23)
Chloride: 104 mEq/L (ref 96–112)
GFR calc Af Amer: 42 mL/min — ABNORMAL LOW (ref >90)
Glucose, Bld: 99 mg/dL (ref 70–99)
Potassium: 3.6 mEq/L (ref 3.5–5.1)

## 2013-01-08 LAB — PROTIME-INR: INR: 1.54 — ABNORMAL HIGH (ref 0.00–1.49)

## 2013-01-08 SURGERY — LEFT HEART CATHETERIZATION WITH CORONARY ANGIOGRAM
Anesthesia: LOCAL

## 2013-01-08 MED ORDER — LIDOCAINE HCL (PF) 1 % IJ SOLN
INTRAMUSCULAR | Status: AC
  Filled 2013-01-08: qty 30

## 2013-01-08 MED ORDER — SODIUM CHLORIDE 0.9 % IV SOLN
INTRAVENOUS | Status: DC
  Administered 2013-01-09: via INTRAVENOUS
  Administered 2013-01-11: 10 mL/h via INTRAVENOUS
  Administered 2013-01-14 – 2013-01-15 (×2): via INTRAVENOUS

## 2013-01-08 MED ORDER — AMIODARONE HCL IN DEXTROSE 360-4.14 MG/200ML-% IV SOLN
60.0000 mg/h | INTRAVENOUS | Status: DC
  Administered 2013-01-09: 30 mg/h via INTRAVENOUS
  Filled 2013-01-08 (×2): qty 200

## 2013-01-08 MED ORDER — SODIUM CHLORIDE 0.9 % IV SOLN
INTRAVENOUS | Status: AC
  Administered 2013-01-08: 75 mL/h via INTRAVENOUS

## 2013-01-08 MED ORDER — MIDAZOLAM HCL 2 MG/2ML IJ SOLN
INTRAMUSCULAR | Status: AC
  Filled 2013-01-08: qty 2

## 2013-01-08 MED ORDER — HEPARIN (PORCINE) IN NACL 2-0.9 UNIT/ML-% IJ SOLN
INTRAMUSCULAR | Status: AC
  Filled 2013-01-08: qty 1500

## 2013-01-08 MED ORDER — NITROGLYCERIN 0.2 MG/ML ON CALL CATH LAB
INTRAVENOUS | Status: AC
  Filled 2013-01-08: qty 1

## 2013-01-08 MED ORDER — FENTANYL CITRATE 0.05 MG/ML IJ SOLN
INTRAMUSCULAR | Status: AC
  Filled 2013-01-08: qty 2

## 2013-01-08 MED ORDER — HEPARIN (PORCINE) IN NACL 100-0.45 UNIT/ML-% IJ SOLN
1050.0000 [IU]/h | INTRAMUSCULAR | Status: DC
  Administered 2013-01-09 – 2013-01-12 (×5): 1500 [IU]/h via INTRAVENOUS
  Administered 2013-01-13 – 2013-01-14 (×3): 1300 [IU]/h via INTRAVENOUS
  Administered 2013-01-15: 1050 [IU]/h via INTRAVENOUS
  Filled 2013-01-08 (×16): qty 250

## 2013-01-08 MED ORDER — MAGNESIUM HYDROXIDE 400 MG/5ML PO SUSP
30.0000 mL | Freq: Every day | ORAL | Status: DC | PRN
  Administered 2013-01-08 – 2013-01-12 (×2): 30 mL via ORAL
  Filled 2013-01-08 (×2): qty 30

## 2013-01-08 MED ORDER — DIAZEPAM 5 MG PO TABS
5.0000 mg | ORAL_TABLET | ORAL | Status: AC
  Administered 2013-01-08: 5 mg via ORAL
  Filled 2013-01-08: qty 1

## 2013-01-08 NOTE — CV Procedure (Signed)
    Cardiac Catheterization Procedure Note  Name: Marissa [name] MRN: [id number] DOB: 1933-04-28  Procedure: Left Heart Cath, Selective Coronary Angiography, Mynx closure device.  Indication: Non-ST elevation myocardial infarction.   Medications:  Sedation:  1 mg IV Versed, 25 mcg IV Fentanyl  Contrast:  30 ml Omnipaque  Procedural details: The right groin was prepped, draped, and anesthetized with 1% lidocaine. Using modified Seldinger technique, a 5 French sheath was introduced into the right femoral artery. Standard Judkins catheters were used for coronary angiography . A JR 4 catheter was used to cross the aortic valve and record  pressure. Left ventricular angiography was not performed. Catheter exchanges were performed over a guidewire. There were no immediate procedural complications. The patient was transferred to the post catheterization recovery area for further monitoring.   Procedural Findings:  Hemodynamics: AO:  132/73   mmHg LV:  138/7    mmHg LVEDP: 13  mmHg  Coronary angiography: Coronary dominance: Right   Left Main:  Normal  Left Anterior Descending (LAD):  Large in size with minor irregularities. The vessel wraps around the apex.  1st diagonal (D1):  Normal in size with minor irregularities.  2nd diagonal (D2):  Normal in size with no significant disease.  3rd diagonal (D3):  Very small in size.  Circumflex (LCx):  Normal in size and nondominant. The vessel has no significant disease.  1st obtuse marginal:  Small in size with no significant disease.  2nd obtuse marginal:  Small in size with no significant disease.  3rd obtuse marginal: Normal in size with diffuse 95% disease proximally in a very tortuous segment. This is likely the culprit for non-ST elevation myocardial infarction.   Right Coronary Artery: Normal in size and dominant. There are minor luminal irregularities proximally. Otherwise no significant disease.   Left ventriculography: Was  not performed due to recent renal failure. EF by echo was 40-45%.  Final Conclusions:   1. Significant 1 vessel coronary artery disease with diffuse 95% stenosis in proximal OM 3. The affected area is significantly tortuous and the distal vessel distribution is relatively small in size. Thus, risks of PCI outweigh the benefits. 2. Normal LVEDP.   Recommendations:  Recommend medical therapy for coronary artery disease. We'll resume anticoagulation with heparin today. Warfarin can be resumed as well.  Lorine Bears MD, [facility] 01/08/2013, 3:00 PM

## 2013-01-08 NOTE — Interval H&P Note (Signed)
History and Physical Interval Note:  01/08/2013 2:19 PM  [name]  has presented today for surgery, with the diagnosis of cp  The various methods of treatment have been discussed with the patient and family. After consideration of risks, benefits and other options for treatment, the patient has consented to  Procedure(s): LEFT HEART CATHETERIZATION WITH CORONARY ANGIOGRAM (N/A) as a surgical intervention .  The patient's history has been reviewed, patient examined, no change in status, stable for surgery.  I have reviewed the patient's chart and labs.  Questions were answered to the patient's satisfaction.     Marissa [name]

## 2013-01-08 NOTE — Progress Notes (Signed)
Patient ID: Marissa [name], female   DOB: [birth year], 79 y.o.   MRN: [id number] Patient well known to me. Cardiac catheterization today. Renal function improved. INR subtherapeutic for cath.

## 2013-01-08 NOTE — Progress Notes (Signed)
ANTICOAGULATION CONSULT NOTE - Initial Consult  Pharmacy Consult for Heparin Indication: atrial fibrillation  Allergies  Allergen Reactions  . Codeine Other (See Comments)    Makes patient sleepy.  Marland Kitchen Penicillins Rash    Patient Measurements: Height: 5\' 5"  (165.1 cm) Weight: 172 lb 6.4 oz (78.2 kg) IBW/kg (Calculated) : 57  Vital Signs: Temp: 98.2 F (36.8 C) (07/02 0400) Temp src: Oral (07/02 0400) BP: 132/69 mmHg (07/02 0500) Pulse Rate: 88 (07/02 0500)  Labs:  Recent Labs  01/05/13 1903 01/06/13 0028 01/06/13 0713 01/06/13 0714 01/06/13 1314 01/06/13 1703 01/06/13 2000 01/07/13 0430 01/07/13 1035 01/08/13 0445  HGB 12.9  --   --   --   --   --   --  11.5*  --  11.2*  HCT 40.3  --   --   --   --   --   --  36.2  --  34.7*  PLT 223  --   --   --   --   --   --  163  --  147*  LABPROT 30.2*  --  27.8*  --   --   --   --  25.0*  --  18.1*  INR 3.02*  --  2.71*  --   --   --   --  2.36*  --  1.54*  HEPARINUNFRC  --   --   --   --   --   --  0.22*  --  0.27* 0.16*  CREATININE 1.77* 1.53* 1.59*  --   --  1.50*  --  1.66*  --   --   TROPONINI <0.30 19.14*  --  >20.00* >20.00*  --   --   --   --   --     Estimated Creatinine Clearance: 29.3 ml/min (by C-G formula based on Cr of 1.66).  Assessment: 79 yo female with chest pain for heparin  Goal of Therapy:  Heparin level 0.3-0.7 Monitor platelets by anticoagulation protocol: Yes   Plan:  Increase Heparin 1500 units/hr F/U after cath  Geannie Risen, PharmD, BCPS  01/08/2013 6:02 AM

## 2013-01-08 NOTE — H&P (View-Only) (Signed)
Patient Name: Marissa [name] Date of Encounter: 01/07/2013  Principal Problem:   Non-ST elevation myocardial infarction (NSTEMI), initial care episode Active Problems:   Chronic atrial fibrillation   Chronic anticoagulation   Nonischemic cardiomyopathy   Cellulitis of right leg   Acute on chronic systolic congestive heart failure    SUBJECTIVE: Pt not having any more chest pain. Chest is  sore, hurts to take a deep breath.  OBJECTIVE Filed Vitals:   01/07/13 0400 01/07/13 0500 01/07/13 0600 01/07/13 0700  BP: 136/55 126/36 109/42 115/47  Pulse: 45 50 48 49  Temp: 100 F (37.8 C)     TempSrc: Oral     Resp: 17 17 17 16   Height:      Weight:  172 lb 6.4 oz (78.2 kg)    SpO2: 98% 100% 99% 99%    Intake/Output Summary (Last 24 hours) at 01/07/13 0719 Last data filed at 01/07/13 0500  Gross per 24 hour  Intake  966.8 ml  Output   1800 ml  Net -833.2 ml   Filed Weights   01/05/13 2356 01/06/13 1100 01/07/13 0500  Weight: 172 lb 9.9 oz (78.3 kg) 172 lb 9.9 oz (78.3 kg) 172 lb 6.4 oz (78.2 kg)    PHYSICAL EXAM General: Well developed, well nourished, female in no acute distress. Head: Normocephalic, atraumatic.  Neck: Supple without bruits, JVD 6 cm. Lungs:  Resp regular and unlabored, decreased breath sound bases Heart: RRR, S1, S2, no S3, S4, 2/6 murmur; no rub. Abdomen: Soft, non-tender, non-distended, BS + x 4.  Extremities: No clubbing, cyanosis, no edema.  Neuro: Alert and oriented X 3. Moves all extremities spontaneously. Psych: Normal affect.  LABS: CBC: Recent Labs  01/05/13 1903 01/07/13 0430  WBC 8.1 10.7*  NEUTROABS 5.2  --   HGB 12.9 11.5*  HCT 40.3 36.2  MCV 98.1 97.1  PLT 223 163   INR: Recent Labs  01/07/13 0430  INR 2.36*   Basic Metabolic Panel: Recent Labs  01/06/13 1313 01/06/13 1703 01/07/13 0430  NA  --  143 143  K  --  4.3 4.1  CL  --  103 103  CO2  --  30 34*  GLUCOSE  --  145* 107*  BUN  --  29* 32*  CREATININE  --   1.50* 1.66*  CALCIUM  --  9.3 9.0  MG 2.4  --   --    Liver Function Tests: Recent Labs  01/06/13 0028  AST 69*  ALT 24  ALKPHOS 53  BILITOT 0.4  PROT 7.1  ALBUMIN 3.5   Cardiac Enzymes: Recent Labs  01/06/13 0028 01/06/13 0714 01/06/13 1314  TROPONINI 19.14* >20.00* >20.00*    BNP: Pro B Natriuretic peptide (BNP)  Date/Time Value Range Status  01/05/2013  7:03 PM 3772.0* 0 - 450 pg/mL Final  08/03/2012  8:40 AM 10951.0* 0 - 450 pg/mL Final   TELE:  SR/Junct rhythm, multiple episodes of NSVT up to 10 beats at a time   ECG: Junctional rhythm, 54, inferolateral ST/T wave changes, no significant change from 6/30  2D ECHO: 01/06/2013 Study Conclusions - Left ventricle: The cavity size was mildly dilated. Wall thickness was increased in a pattern of mild LVH. Systolic function was mildly to moderately reduced. The estimated ejection fraction was in the range of 40% to 45%. Diffuse hypokinesis. There is hypokinesis of the anteroseptal myocardium. - Mitral valve: Calcified annulus. Mild regurgitation. - Left atrium: The atrium was moderately dilated. 51 mm -  Right ventricle: The cavity size was mildly dilated. - Right atrium: The atrium was moderately to severely dilated. - Tricuspid valve: Moderate regurgitation. - Pulmonary arteries: PA peak pressure: 54mm Hg (S).  Radiology/Studies: Dg Chest Portable 1 View 01/05/2013   *RADIOLOGY REPORT*  Clinical Data: Shortness of breath and chest pain.  PORTABLE CHEST - 1 VIEW  Comparison: PA and lateral chest 07/23/2011 and 08/03/2012.  Findings: There is cardiomegaly and mild interstitial edema.  Trace left pleural effusion is noted.  Chronic, mild elevation of the right hemidiaphragm relative to the left is noted.  No consolidative process or pneumothorax identified.  IMPRESSION: Cardiomegaly and mild interstitial edema with a small left effusion.   Original Report Authenticated By: Holley Dexter, M.D.     Current  Medications:  . atorvastatin  10 mg Oral Daily  . cholecalciferol  2,000 Units Oral Daily  . digoxin  0.0625 mg Oral Daily  . folic acid  1 mg Oral Daily  . furosemide  80 mg Intravenous BID  . gabapentin  300 mg Oral QHS  . hydroxychloroquine  200 mg Oral Daily  . metoprolol succinate  50 mg Oral BID WC  . potassium chloride  20 mEq Oral BID  . predniSONE  5 mg Oral Daily  . sodium chloride  3 mL Intravenous Q12H   . amiodarone (NEXTERONE PREMIX) 360 mg/200 mL dextrose 30 mg/hr (01/06/13 2100)  . heparin 1,200 Units/hr (01/06/13 2212)  . nitroGLYCERIN 10 mcg/min (01/06/13 1600)    ASSESSMENT AND PLAN: Principal Problem:   Non-ST elevation myocardial infarction (NSTEMI), initial care episode - cath when medically stable. Pain-free now on current Rx, keep NPO in case cath is today.  Sustained VT - s/p arrest yest with pulseless VT, shocked x 1, on amio but still with NSVT, will re-bolus and increase gtt to 60 mg/hr for now.  Active Problems:   Chronic atrial fibrillation - afib -> VT yest, shock -> SR, now junctional   Chronic anticoagulation - s/p Vit K 2.5 mg po x 1 yest, INR trending down.   Nonischemic cardiomyopathy - EF yest 40-45 % post-arrest   Cellulitis of right leg - will have WOC RN to see   Acute on chronic systolic congestive heart failure - Lasix 80 mg IV BID, 2 doses yest. Hold until cath decision made.   Acute on chronic renal insuff Stage 3 - Cr increased with diuresis and MI, follow    Plan: increase Amio, keep NPO for now and hold Lasix till MD sees. Keep in CCU. WOC consult and recheck CXR.  Signed, Theodore Demark , PA-C 7:19 AM 01/07/2013 Patient seen and examined and history reviewed. Agree with above findings and plan. Patient had an episode of sustained VT yesterday requiring defibrillation. Now on IV amiodarone with NSVT. No chest pain. Breathing much improved today. Lungs clearer. JVD down. EF actually improved compared to 1/13 and 1/14. Renal function  stable-appears to have plateaued. Clearly needs cardiac cath. Prior study showed nonobstructive disease. INR still high. Patient does not want radial cath done. She had significant bruising, swelling, and pain after her last radial cath and had to see vascular surgery. In light of this I think it will be best to wait until tomorrow to do cath. We will give vitamin K po today. Hold metoprolol due to bradycardia. Give lasix IV this am but hold pm dose and recheck renal function and INR in AM. Plan cardiac cath tomorrow. Patient in agreement with this plan.  Theron Arista [facility]  01/07/2013 1:19 PM

## 2013-01-08 NOTE — Progress Notes (Signed)
EKG done to confirm pt's heart rhythm; A-fib confirmed by EKG & Theodore Demark, PA; will continue to monitor pt closely and update as needed

## 2013-01-09 ENCOUNTER — Inpatient Hospital Stay (HOSPITAL_COMMUNITY): Admission: RE | Admit: 2013-01-09 | Payer: Medicare Other | Source: Ambulatory Visit | Admitting: Physical Therapy

## 2013-01-09 ENCOUNTER — Encounter (HOSPITAL_COMMUNITY): Payer: Self-pay | Admitting: General Practice

## 2013-01-09 DIAGNOSIS — I428 Other cardiomyopathies: Secondary | ICD-10-CM

## 2013-01-09 DIAGNOSIS — E785 Hyperlipidemia, unspecified: Secondary | ICD-10-CM

## 2013-01-09 LAB — CBC
HCT: 37.7 % (ref 36.0–46.0)
MCV: 96.2 fL (ref 78.0–100.0)
RBC: 3.92 MIL/uL (ref 3.87–5.11)
WBC: 9.5 10*3/uL (ref 4.0–10.5)

## 2013-01-09 LAB — PROTIME-INR: INR: 1.27 (ref 0.00–1.49)

## 2013-01-09 LAB — BASIC METABOLIC PANEL
CO2: 31 mEq/L (ref 19–32)
Calcium: 9.4 mg/dL (ref 8.4–10.5)
Chloride: 100 mEq/L (ref 96–112)
Creatinine, Ser: 1.18 mg/dL — ABNORMAL HIGH (ref 0.50–1.10)
Glucose, Bld: 126 mg/dL — ABNORMAL HIGH (ref 70–99)

## 2013-01-09 LAB — URINE CULTURE

## 2013-01-09 MED ORDER — AMIODARONE HCL 200 MG PO TABS
200.0000 mg | ORAL_TABLET | Freq: Two times a day (BID) | ORAL | Status: DC
  Administered 2013-01-09 – 2013-01-12 (×8): 200 mg via ORAL
  Filled 2013-01-09 (×10): qty 1

## 2013-01-09 MED ORDER — METOPROLOL TARTRATE 12.5 MG HALF TABLET
12.5000 mg | ORAL_TABLET | Freq: Two times a day (BID) | ORAL | Status: DC
  Administered 2013-01-09 – 2013-01-13 (×10): 12.5 mg via ORAL
  Filled 2013-01-09 (×12): qty 1

## 2013-01-09 MED ORDER — WARFARIN - PHARMACIST DOSING INPATIENT
Freq: Every day | Status: DC
  Administered 2013-01-09 – 2013-01-15 (×2)

## 2013-01-09 MED ORDER — WARFARIN SODIUM 5 MG PO TABS
5.0000 mg | ORAL_TABLET | Freq: Once | ORAL | Status: AC
  Administered 2013-01-09: 5 mg via ORAL
  Filled 2013-01-09: qty 1

## 2013-01-09 MED ORDER — CEPHALEXIN 500 MG PO CAPS
500.0000 mg | ORAL_CAPSULE | Freq: Two times a day (BID) | ORAL | Status: AC
  Administered 2013-01-09 – 2013-01-15 (×14): 500 mg via ORAL
  Filled 2013-01-09 (×15): qty 1

## 2013-01-09 MED ORDER — FUROSEMIDE 40 MG PO TABS
40.0000 mg | ORAL_TABLET | Freq: Every day | ORAL | Status: DC
  Administered 2013-01-09 – 2013-01-16 (×8): 40 mg via ORAL
  Filled 2013-01-09 (×9): qty 1

## 2013-01-09 NOTE — Progress Notes (Signed)
In to visit with patient at bedside.  Explained the liaison role and provided contact information.  Niece and sister visiting at bedside.  Patient in good spirits and very conversant.  Of note, [facility] Care Management services does not replace or interfere with any services that are arranged by inpatient case management or social work.  For additional questions or referrals please contact Anibal Henderson BSN RN [facility] Liaison at [phone].

## 2013-01-09 NOTE — Progress Notes (Signed)
Patient admitted to [facility] on 6.29.14 with complaint of chest pain and SOB. Patient is currently active with [facility] Care Management for chronic disease management services.  Patient has been engaged by a [employer].  Our community based plan of care has focused on disease management of CHF and atrial fibrillation.  Patient adheres to medication regimen as prescribed.  She is 75% adherent to her prescribed diet.  She ambulates inside her home with a cane and outside with a walker.  She weighs daily.  She is safe to return to home, but remains fragile at baseline.  Patient will receive a post discharge transition of care call and will be evaluated for monthly home visits for assessments and disease process education.  Made inpatient Case Manager aware that [facility] Care Management following. Of note, [facility] Care Management services does not replace or interfere with any services that are arranged by inpatient case management or social work.  For additional questions or referrals please contact Anibal Henderson BSN RN [facility] Liaison at [phone].

## 2013-01-09 NOTE — Progress Notes (Addendum)
Patient Name: Marissa [name] Date of Encounter: 01/09/2013  Principal Problem:   Non-ST elevation myocardial infarction (NSTEMI), initial care episode Active Problems:   Chronic atrial fibrillation   Chronic anticoagulation   Nonischemic cardiomyopathy   Cellulitis of right leg   Acute on chronic systolic congestive heart failure   Sustained VT (ventricular tachycardia)    SUBJECTIVE: Doing well. Denies any chest pain or SOB. No groin complications.   OBJECTIVE Filed Vitals:   01/09/13 0400 01/09/13 0500 01/09/13 0600 01/09/13 0700  BP: 107/58 115/44 128/107 121/66  Pulse: 74 71  70  Temp: 98.6 F (37 C)     TempSrc: Oral     Resp: 16 13 17 14   Height:      Weight:  172 lb 6.4 oz (78.2 kg)    SpO2: 98% 97% 97% 98%    Intake/Output Summary (Last 24 hours) at 01/09/13 0740 Last data filed at 01/09/13 0700  Gross per 24 hour  Intake 2112.65 ml  Output    890 ml  Net 1222.65 ml   Filed Weights   01/07/13 0500 01/08/13 0500 01/09/13 0500  Weight: 172 lb 6.4 oz (78.2 kg) 171 lb 11.8 oz (77.9 kg) 172 lb 6.4 oz (78.2 kg)    PHYSICAL EXAM General: Well developed, well nourished, female in no acute distress. Head: Normocephalic, atraumatic.  Neck: Supple without bruits, JVD normal. Lungs:  Resp regular and unlabored, clear. Heart: RRR, S1, S2, no S3, S4, 2/6 murmur; no rub. Abdomen: Soft, non-tender, non-distended, BS + x 4.  Extremities: No clubbing, cyanosis, no edema. No groin hematoma. Neuro: Alert and oriented X 3. Moves all extremities spontaneously. Psych: Normal affect.  LABS: CBC:  Recent Labs  01/07/13 0430 01/08/13 0445  WBC 10.7* 10.1  HGB 11.5* 11.2*  HCT 36.2 34.7*  MCV 97.1 95.9  PLT 163 147*   INR:  Recent Labs  01/08/13 0445  INR 1.54*   Basic Metabolic Panel: Recent Labs  01/06/13 1313  01/07/13 0430 01/08/13 0445  NA  --   < > 143 144  K  --   < > 4.1 3.6  CL  --   < > 103 104  CO2  --   < > 34* 31  GLUCOSE  --   < > 107*  99  BUN  --   < > 32* 30*  CREATININE  --   < > 1.66* 1.37*  CALCIUM  --   < > 9.0 8.8  MG 2.4  --   --   --   < > = values in this interval not displayed. Cardiac Enzymes:  Recent Labs  01/06/13 1314  TROPONINI >20.00*    BNP: Pro B Natriuretic peptide (BNP)  Date/Time Value Range Status  01/05/2013  7:03 PM 3772.0* 0 - 450 pg/mL Final  08/03/2012  8:40 AM 10951.0* 0 - 450 pg/mL Final   TELE:  Atrial fibrillation with controlled ventricular response. Occ PVC. No VT.   ECG: 7/2 Afib. ST-T depression c/w inferolateral ischemia.  2D ECHO: 01/06/2013 Study Conclusions - Left ventricle: The cavity size was mildly dilated. Wall thickness was increased in a pattern of mild LVH. Systolic function was mildly to moderately reduced. The estimated ejection fraction was in the range of 40% to 45%. Diffuse hypokinesis. There is hypokinesis of the anteroseptal myocardium. - Mitral valve: Calcified annulus. Mild regurgitation. - Left atrium: The atrium was moderately dilated. 51 mm - Right ventricle: The cavity size was mildly dilated. -  Right atrium: The atrium was moderately to severely dilated. - Tricuspid valve: Moderate regurgitation. - Pulmonary arteries: PA peak pressure: 54mm Hg (S).  Radiology/Studies: Dg Chest Portable 1 View 01/05/2013   *RADIOLOGY REPORT*  Clinical Data: Shortness of breath and chest pain.  PORTABLE CHEST - 1 VIEW  Comparison: PA and lateral chest 07/23/2011 and 08/03/2012.  Findings: There is cardiomegaly and mild interstitial edema.  Trace left pleural effusion is noted.  Chronic, mild elevation of the right hemidiaphragm relative to the left is noted.  No consolidative process or pneumothorax identified.  IMPRESSION: Cardiomegaly and mild interstitial edema with a small left effusion.   Original Report Authenticated By: Holley Dexter, M.D.   Cardiac Catheterization Procedure Note  Name: JEMYA DEPIERRO  MRN: [id number]  DOB: 1933/03/04  Procedure: Left  Heart Cath, Selective Coronary Angiography, Mynx closure device.  Indication: Non-ST elevation myocardial infarction.  Medications:  Sedation: 1 mg IV Versed, 25 mcg IV Fentanyl  Contrast: 30 ml Omnipaque  Procedural details: The right groin was prepped, draped, and anesthetized with 1% lidocaine. Using modified Seldinger technique, a 5 French sheath was introduced into the right femoral artery. Standard Judkins catheters were used for coronary angiography . A JR 4 catheter was used to cross the aortic valve and record pressure. Left ventricular angiography was not performed. Catheter exchanges were performed over a guidewire. There were no immediate procedural complications. The patient was transferred to the post catheterization recovery area for further monitoring.  Procedural Findings:  Hemodynamics:  AO: 132/73 mmHg  LV: 138/7 mmHg  LVEDP: 13 mmHg  Coronary angiography:  Coronary dominance: Right  Left Main: Normal  Left Anterior Descending (LAD): Large in size with minor irregularities. The vessel wraps around the apex.  1st diagonal (D1): Normal in size with minor irregularities.  2nd diagonal (D2): Normal in size with no significant disease.  3rd diagonal (D3): Very small in size.  Circumflex (LCx): Normal in size and nondominant. The vessel has no significant disease.  1st obtuse marginal: Small in size with no significant disease.  2nd obtuse marginal: Small in size with no significant disease.  3rd obtuse marginal: Normal in size with diffuse 95% disease proximally in a very tortuous segment. This is likely the culprit for non-ST elevation myocardial infarction.  Right Coronary Artery: Normal in size and dominant. There are minor luminal irregularities proximally. Otherwise no significant disease. Left ventriculography: Was not performed due to recent renal failure. EF by echo was 40-45%.  Final Conclusions:  1. Significant 1 vessel coronary artery disease with diffuse 95%  stenosis in proximal OM 3. The affected area is significantly tortuous and the distal vessel distribution is relatively small in size. Thus, risks of PCI outweigh the benefits.  2. Normal LVEDP.  Recommendations:  Recommend medical therapy for coronary artery disease. We'll resume anticoagulation with heparin today. Warfarin can be resumed as well.  Lorine Bears MD, [facility]  01/08/2013, 3:00 PM    Current Medications:  . amiodarone  200 mg Oral BID  . atorvastatin  10 mg Oral Daily  . cholecalciferol  2,000 Units Oral Daily  . ciprofloxacin  250 mg Oral Q12H  . digoxin  0.0625 mg Oral Daily  . folic acid  1 mg Oral Daily  . gabapentin  300 mg Oral QHS  . hydroxychloroquine  200 mg Oral Daily  . metoprolol tartrate  12.5 mg Oral BID  . potassium chloride  20 mEq Oral BID  . predniSONE  5 mg Oral Daily   .  sodium chloride 10 mL/hr at 01/09/13 0000  . heparin 1,500 Units/hr (01/09/13 0000)    ASSESSMENT AND PLAN: 1. NSTEMI secondary to acute OM3 stenosis. Vessel small and very tortuous. Appearance looks like spontaneous coronary dissection. Poorly suited for PCI. Will manage medically. Resume Coumadin. Resume metoprolol 12.5 mg bid. Continue statin. Transfer to step down and advance activity.  2. Ventricular tachycardia secondary to acute MI. Event occurred during the first 48 hours related to ischemia. No VT in last 48 hours. EF 40-45%. Will transition amiodarone to po.   3. E.coli UTI- on cipro. Sensitivities pending. Will DC foley.  4. Atrial fibrillation chronic. Rate controlled. On IV heparin. Will resume coumadin.   5. Acute on chronic CHF- appears euvolemic now. ARB held due to acute renal insufficiency. Consider resuming once creatinine at baseline. Was on cozaar prior to admit. Also previously on Lasix 40 mg bid. Will resume daily.  6. Stasis ulcer right leg. Care per wound care nurse.  7. Hyperlipidemia. On lipitor.  8. Nonischemic cardiomyopathy. EF has improved from 1/14  15-20%>> 40-45%.  9. RA with history of vasculitis.  10. Acute renal failure on chronic kidney disease. Baseline creatinine 1.19. Follow BMET.  Theron Arista [facility] 01/09/2013 7:40 AM

## 2013-01-09 NOTE — Progress Notes (Signed)
CARDIAC REHAB PHASE I   PRE:  Rate/Rhythm: 74 afib    BP: sitting 124/46    SaO2: 94 RA  MODE:  Ambulation: to door   POST:  Rate/Rhythm: 96 afib    BP: sitting 113/63     SaO2: 92 RA  Pt is weak and deconditioned. She walks with cane, bent over at her baseline, able to live independently with assistance getting groceries. Lightly prepares food, works part time. After several days in bed she feels very weak. Assist x1 with gait belt to stand and take very small steps to door. Very bent over RW. Return to chair with her normal SOB, no CP. Pt will not be able to go home independently right now. Needs PT eval. Pt is open to SNF. Gave MI book and diet. Sts she does weigh daily, has gained 15 lbs since January.  [id number]   Elissa Lovett [place] CES, ACSM 01/09/2013 3:35 PM

## 2013-01-09 NOTE — Progress Notes (Signed)
ANTICOAGULATION CONSULT NOTE   Pharmacy Consult for Heparin / Coumadin Indication: atrial fibrillation  Allergies  Allergen Reactions  . Codeine Other (See Comments)    Makes patient sleepy.  Marland Kitchen Penicillins Rash   Labs:  Recent Labs  01/06/13 1314 01/06/13 1703  01/07/13 0430 01/07/13 1035 01/08/13 0445 01/09/13 0835  HGB  --   --   < > 11.5*  --  11.2* 12.2  HCT  --   --   --  36.2  --  34.7* 37.7  PLT  --   --   --  163  --  147* 177  LABPROT  --   --   --  25.0*  --  18.1* 15.6*  INR  --   --   --  2.36*  --  1.54* 1.27  HEPARINUNFRC  --   --   < >  --  0.27* 0.16* 0.52  CREATININE  --  1.50*  --  1.66*  --  1.37*  --   TROPONINI >20.00*  --   --   --   --   --   --   < > = values in this interval not displayed.  Estimated Creatinine Clearance: 35.6 ml/min (by C-G formula based on Cr of 1.37).  Assessment: 79 yo female on Coumadin PTA for Afib.  Heparin and Coumadin resumed s/p cath.  Heparin level therapeutic this AM  Goal of Therapy:  Heparin level 0.3-0.7 INR=2-3 Monitor platelets by anticoagulation protocol: Yes   Plan:  1) Continue heparin at 1500 units / hr 2) Coumadin 5 mg po x 1 dose today 3) Follow up AM labs  Thank you. Okey Regal, PharmD [phone]  01/09/2013 9:32 AM

## 2013-01-10 LAB — CBC
Hemoglobin: 11.4 g/dL — ABNORMAL LOW (ref 12.0–15.0)
MCHC: 32.1 g/dL (ref 30.0–36.0)
RBC: 3.72 MIL/uL — ABNORMAL LOW (ref 3.87–5.11)

## 2013-01-10 LAB — PROTIME-INR
INR: 1.24 (ref 0.00–1.49)
Prothrombin Time: 15.3 seconds — ABNORMAL HIGH (ref 11.6–15.2)

## 2013-01-10 LAB — HEPARIN LEVEL (UNFRACTIONATED): Heparin Unfractionated: 0.49 IU/mL (ref 0.30–0.70)

## 2013-01-10 LAB — BASIC METABOLIC PANEL
CO2: 30 mEq/L (ref 19–32)
Calcium: 9.1 mg/dL (ref 8.4–10.5)
Chloride: 102 mEq/L (ref 96–112)
Potassium: 4.6 mEq/L (ref 3.5–5.1)
Sodium: 138 mEq/L (ref 135–145)

## 2013-01-10 LAB — DIGOXIN LEVEL: Digoxin Level: 0.5 ng/mL — ABNORMAL LOW (ref 0.8–2.0)

## 2013-01-10 MED ORDER — POTASSIUM CHLORIDE ER 10 MEQ PO TBCR
20.0000 meq | EXTENDED_RELEASE_TABLET | Freq: Every day | ORAL | Status: DC
  Filled 2013-01-10: qty 2

## 2013-01-10 MED ORDER — POTASSIUM CHLORIDE ER 10 MEQ PO TBCR
10.0000 meq | EXTENDED_RELEASE_TABLET | Freq: Every day | ORAL | Status: DC
  Administered 2013-01-10 – 2013-01-16 (×7): 10 meq via ORAL
  Filled 2013-01-10 (×7): qty 1

## 2013-01-10 MED ORDER — WARFARIN SODIUM 5 MG PO TABS
5.0000 mg | ORAL_TABLET | Freq: Once | ORAL | Status: AC
  Administered 2013-01-10: 5 mg via ORAL
  Filled 2013-01-10: qty 1

## 2013-01-10 NOTE — Progress Notes (Signed)
The patient was complaining of back pain.  She received Tylenol and a heat pack was placed on the area.

## 2013-01-10 NOTE — Evaluation (Signed)
Physical Therapy Evaluation Patient Details Name: Marissa [name] MRN: [id number] DOB: 1932-12-15 Today's Date: 01/10/2013 Time: 4098-1191 PT Time Calculation (min): 22 min  PT Assessment / Plan / Recommendation History of Present Illness  NSTEMI, Afib  Clinical Impression  79 year old female has a quite complex cardiac history. She has a history of a nonischemic cardiomyopathy with a most recent ejection fraction between 15 and 20%. She also has long-standing rheumatoid arthritis since age 36 and has known vasculitis from this. She has chronic atrial fibrillation and chronic systolic heart failure. She has been on long-standing warfarin therapy. She was admitted with some mild congestive heart failure and cellulitis earlier this year and has been treated at the wound clinic. She developed chest discomfort described as bilateral arm pain as well as mid sternal chest pain today that was severe associated with shortness of breath and came to the emergency room  Pt will benefit from acute PT services to improve overall mobility and prepare for safe d/c home with sister.  Pt will need HHPT with 24 hour assistance at home.  Pt will benefit from RW next session to increase ambulation distance.  Pt did report using mainly SPC prior to admission.     PT Assessment  Patient needs continued PT services    Follow Up Recommendations  Home health PT;Supervision/Assistance - 24 hour    Barriers to Discharge        Equipment Recommendations  None recommended by PT    Recommendations for Other Services     Frequency Min 3X/week    Precautions / Restrictions Precautions Precautions: None Restrictions Weight Bearing Restrictions: No   Pertinent Vitals/Pain C/o back pain and right knee pain/discomfort.  RN getting hot pack for pt's back. Does not rate pain      Mobility  Bed Mobility Bed Mobility: Sit to Supine Sit to Supine: 4: Min assist;With rail Details for Bed Mobility Assistance: (A) to  slowly descend shoulders into bed with cues for technique Transfers Transfers: Sit to Stand;Stand to Sit Sit to Stand: 1: +2 Total assist;From chair/3-in-1 Sit to Stand: Patient Percentage: 60% Stand to Sit: 1: +2 Total assist;To chair/3-in-1;To bed Stand to Sit: Patient Percentage: 70% Details for Transfer Assistance: +2 (A) for balance and increase assistance from lower surface.  Pt able to complete sit <> stand higher surfaces with min (A). Ambulation/Gait Ambulation/Gait Assistance: 1: +2 Total assist Ambulation/Gait: Patient Percentage: 60% Ambulation Distance (Feet): 20 Feet Assistive device: 2 person hand held assist Ambulation/Gait Assistance Details: (A) to maintain balance with cues for upright posture.  Pt ambulates with trunk flexed and heavy reliance on UE weight bearing right > left Gait Pattern: Step-to pattern;Decreased stance time - right;Shuffle;Antalgic;Trunk flexed;Wide base of support Gait velocity: decreaed due to back and knee pain Stairs: No Wheelchair Mobility Wheelchair Mobility: No    Exercises     PT Diagnosis: Difficulty walking;Generalized weakness  PT Problem List: Decreased strength;Decreased activity tolerance;Decreased balance;Decreased mobility;Decreased knowledge of use of DME;Pain PT Treatment Interventions: DME instruction;Gait training;Stair training;Functional mobility training;Therapeutic activities;Therapeutic exercise;Patient/family education     PT Goals(Current goals can be found in the care plan section) Acute Rehab PT Goals Patient Stated Goal: To get stronger PT Goal Formulation: With patient Time For Goal Achievement: 01/17/13 Potential to Achieve Goals: Good  Visit Information  Last PT Received On: 01/10/13 Assistance Needed: +1 History of Present Illness: NSTEMI       Prior Functioning  Home Living Family/patient expects to be discharged to:: Private residence Living Arrangements:  Alone (Plans to go to her sister's  home) Available Help at Discharge: Family Type of Home: House Home Access: Stairs to enter [employer] of Steps: 3 Entrance Stairs-Rails: Right;Left;Can reach both Home Layout: One level Home Equipment: Walker - 2 wheels;Bedside commode;Cane - single point Additional Comments: Pt plans to d/c to sister's home however unsure of sister's bathroom set up.  Pt did report one step to get into sister's townhome. Prior Function Level of Independence: Independent with assistive device(s) (SPC) Communication Communication: No difficulties Dominant Hand: Right    Cognition  Cognition Arousal/Alertness: Awake/alert Behavior During Therapy: WFL for tasks assessed/performed Overall Cognitive Status: Within Functional Limits for tasks assessed    Extremity/Trunk Assessment Lower Extremity Assessment Lower Extremity Assessment: Generalized weakness (Pt with RA and right knee pain) Cervical / Trunk Assessment Cervical / Trunk Assessment: Kyphotic   Balance    End of Session PT - End of Session Equipment Utilized During Treatment: Gait belt Activity Tolerance: Patient tolerated treatment well Patient left: in bed;with call bell/phone within reach Nurse Communication: Mobility status  GP     Triva Hueber 01/10/2013, 11:06 AM  Jake Shark, PT DPT [phone]

## 2013-01-10 NOTE — Progress Notes (Signed)
Rec Pt from 2900. Pt O4x. Heat pack giving Pt for back pain

## 2013-01-10 NOTE — Progress Notes (Signed)
Called report to [facility] 4700; pt stable at time of transfer

## 2013-01-10 NOTE — Progress Notes (Signed)
ANTICOAGULATION CONSULT NOTE - Follow Up Consult  Pharmacy Consult for Heparin + Warfarin  Indication: atrial fibrillation  Allergies  Allergen Reactions  . Codeine Other (See Comments)    Makes patient sleepy.  Marland Kitchen Penicillins Rash    Patient Measurements: Height: 5\' 5"  (165.1 cm) Weight: 179 lb 0.2 oz (81.2 kg) IBW/kg (Calculated) : 57 Heparin Dosing Weight: 74 kg  Vital Signs: Temp: 97.7 F (36.5 C) (07/04 0813) Temp src: Oral (07/04 0813) BP: 149/81 mmHg (07/04 0813) Pulse Rate: 84 (07/04 0813)  Labs:  Recent Labs  01/08/13 0445 01/09/13 0835 01/10/13 0440  HGB 11.2* 12.2 11.4*  HCT 34.7* 37.7 35.5*  PLT 147* 177 187  LABPROT 18.1* 15.6* 15.3*  INR 1.54* 1.27 1.24  HEPARINUNFRC 0.16* 0.52 0.49  CREATININE 1.37* 1.18* 1.20*    Estimated Creatinine Clearance: 41.3 ml/min (by C-G formula based on Cr of 1.2).   Medications:  Heparin @ 1500 units/hr Warf 5 mg x 1 dose on 7/3  Assessment: 79 y.o. F on warfarin PTA for hx Afib -- held on admit and transitioned to heparin for ACS sx while awaiting cardiac cath. INR reversed with Vit K 2.5 mg po on 6/30 and 5 mg po on 7/1 for cardiac cath on 7/2. Warfarin resumed yesterday. Heparin level this morning is therapeutic (HL 0.49 << 0.52), INR remains SUBtherapeutic (INR 1.24 << 1.27).   PTA the patient was known to be taking 5 mg daily EXCEPT for 2.5 mg on Mondays only. The patient was started on and is currently still being loaded with amiodarone this admit -- which is known to increase warfarin sensitivity. However, the INR will likely be slow to move given recent administration of Vit K. Will continue to dose cautiously -- total requirements will likely be lower since amiodarone has been initiated.  Goal of Therapy:  INR 2-3 Heparin level 0.3-0.7 units/ml Monitor platelets by anticoagulation protocol: Yes   Plan:  1. Continue heparin at current rate of 1500 units/hr (15 ml/hr) 2. Repeat warfarin 5 mg x 1 dose at  1800 today 3. Will continue to monitor for any signs/symptoms of bleeding and will follow up with heparin level and INR in the a.m.   Georgina Pillion, PharmD, BCPS Clinical Pharmacist Pager: [phone] 01/10/2013 11:43 AM

## 2013-01-10 NOTE — Progress Notes (Signed)
Patient: Marissa [name] / Admit Date: 01/05/2013 / Date of Encounter: 01/10/2013, 6:40 AM   Subjective  No CP or SOB. Feels well. L side still a little sore from defib several days ago.   Objective   Telemetry: afib rate controlled Physical Exam: Filed Vitals:   01/10/13 0356  BP: 150/69  Pulse: 86  Temp: 98.1 F (36.7 C)  Resp: 20   General: Well developed, well nourished WF in no acute distress. Head: Normocephalic, atraumatic, sclera non-icteric, no xanthomas, nares are without discharge. Neck: Negative for carotid bruits. JVD not elevated. Lungs: Clear bilaterally to auscultation without wheezes, rales, or rhonchi. Breathing is unlabored. Heart: RRR S1 S2 without rubs or gallops. Soft SEM. Abdomen: Soft, non-tender, non-distended with normoactive bowel sounds. No hepatomegaly. No rebound/guarding. No obvious abdominal masses. Msk:  Strength and tone appear normal for age. Extremities: No clubbing or cyanosis. No edema. Distal RLE dressed.  Distal pedal pulses are 2+ and equal bilaterally. R groin without bruit, ecchymosis or oozing Neuro: Alert and oriented X 3. Moves all extremities spontaneously. Psych:  Responds to questions appropriately with a normal affect.    Intake/Output Summary (Last 24 hours) at 01/10/13 0640 Last data filed at 01/10/13 0600  Gross per 24 hour  Intake  986.8 ml  Output    315 ml  Net  671.8 ml    Inpatient Medications:  . amiodarone  200 mg Oral BID  . atorvastatin  10 mg Oral Daily  . cephALEXin  500 mg Oral Q12H  . cholecalciferol  2,000 Units Oral Daily  . digoxin  0.0625 mg Oral Daily  . folic acid  1 mg Oral Daily  . furosemide  40 mg Oral Daily  . gabapentin  300 mg Oral QHS  . hydroxychloroquine  200 mg Oral Daily  . metoprolol tartrate  12.5 mg Oral BID  . potassium chloride  20 mEq Oral BID  . predniSONE  5 mg Oral Daily  . Warfarin - Pharmacist Dosing Inpatient   Does not apply q1800    [name]:  Recent [name]  01/09/13 0835  01/10/13 0440  NA 139 138  K 4.3 4.6  CL 100 102  CO2 31 30  GLUCOSE 126* 94  BUN 26* 25*  CREATININE 1.18* 1.20*  CALCIUM 9.4 9.1    Recent [name]  01/09/13 0835 01/10/13 0440  WBC 9.5 10.1  HGB 12.2 11.4*  HCT 37.7 35.5*  MCV 96.2 95.4  PLT 177 187    Radiology/Studies:  Dg Chest Port 1 View  01/07/2013   *RADIOLOGY REPORT*  Clinical Data: CHF, weakness, CPR  PORTABLE CHEST - 1 VIEW  Comparison: 01/05/2013  Findings: Increased interstitial markings without frank interstitial edema.  Possible small left pleural effusion. Eventration of the right hemidiaphragm.  Cardiomegaly.  Defibrillator pads overlying the left hemithorax.  IMPRESSION: Increased interstitial markings without frank interstitial edema.  Possible small left pleural effusion.   Original Report Authenticated By: Charline Bills, M.D.   Dg Chest Portable 1 View  01/05/2013   *RADIOLOGY REPORT*  Clinical Data: Shortness of breath and chest pain.  PORTABLE CHEST - 1 VIEW  Comparison: PA and lateral chest 07/23/2011 and 08/03/2012.  Findings: There is cardiomegaly and mild interstitial edema.  Trace left pleural effusion is noted.  Chronic, mild elevation of the right hemidiaphragm relative to the left is noted.  No consolidative process or pneumothorax identified.  IMPRESSION: Cardiomegaly and mild interstitial edema with a small left effusion.   Original Report Authenticated By:  Holley Dexter, M.D.     Assessment and Plan  1. NSTEMI secondary to acute OM3 stenosis. Vessel small and very tortuous. Appearance looks like spontaneous coronary dissection. Poorly suited for PCI. Will be managed medically. Coumadin resumed. Continue BB, statin.  2. Ventricular tachycardia s/p defibrillation secondary to acute MI. Event occurred during the first 48 hours related to ischemia. No recurrence. EF 40-45%. Amiodarone transitioned to PO yesterday - will defer question of duration to MD.   3. E.coli UTI - resistant to Cipro, has  since been switched to Keflex. Day 2/7  4. Atrial fibrillation, chronic. Rate controlled. On IV heparin->Coumadin.   5. Acute on chronic CHF/NICM - EF has improved from 1/14 15-20%>> 40-45%. Appears euvolemic now. ARB held due to acute renal insufficiency. Consider resuming as Cr now stable.  6. Stasis ulcer right leg - Care per wound care nurse.   7. Hyperlipidemia - On lipitor.   8. RA with history of vasculitis - on home prednisone.    9. Acute renal failure on chronic kidney disease-  Baseline creatinine 1.19, remaining stable. Will drop KCl to once daily (was on daily at home per pt - this was with BID Lasix, but she is now on once-daily lasix.)  10. Deconditioning - PT eval. Pt open to SNF. Care management also following. Consider transfer to floor.   Signed, Ronie Spies PA-C Patient examined and agree except changes made.I would not discharge on amiodarone since VT in setting of ACS. Agree with SNF. Will transfer to telemetry.  Valera Castle, MD 01/10/2013 7:39 AM

## 2013-01-11 DIAGNOSIS — R079 Chest pain, unspecified: Secondary | ICD-10-CM

## 2013-01-11 LAB — CBC
MCH: 30.4 pg (ref 26.0–34.0)
MCHC: 32.1 g/dL (ref 30.0–36.0)
Platelets: 187 10*3/uL (ref 150–400)
RBC: 3.59 MIL/uL — ABNORMAL LOW (ref 3.87–5.11)
RDW: 14.6 % (ref 11.5–15.5)

## 2013-01-11 LAB — HEPARIN LEVEL (UNFRACTIONATED): Heparin Unfractionated: 0.38 IU/mL (ref 0.30–0.70)

## 2013-01-11 LAB — BASIC METABOLIC PANEL
Chloride: 105 mEq/L (ref 96–112)
Creatinine, Ser: 1.22 mg/dL — ABNORMAL HIGH (ref 0.50–1.10)
GFR calc Af Amer: 48 mL/min — ABNORMAL LOW (ref >90)

## 2013-01-11 LAB — PROTIME-INR: Prothrombin Time: 16.3 seconds — ABNORMAL HIGH (ref 11.6–15.2)

## 2013-01-11 MED ORDER — WARFARIN SODIUM 5 MG PO TABS
5.0000 mg | ORAL_TABLET | Freq: Once | ORAL | Status: AC
  Administered 2013-01-11: 5 mg via ORAL
  Filled 2013-01-11: qty 1

## 2013-01-11 NOTE — Progress Notes (Signed)
Physical Therapy Treatment Patient Details Name: Marissa [name] MRN: [id number] DOB: Jun 27, 1933 Today's Date: 01/11/2013 Time: 7829-5621 PT Time Calculation (min): 24 min  PT Assessment / Plan / Recommendation  PT Comments   Pt with improved mobility using walker.  Follow Up Recommendations  Home health PT;Supervision - Intermittent (Pt to stay at sister's house.)     Does the patient have the potential to tolerate intense rehabilitation     Barriers to Discharge        Equipment Recommendations  None recommended by PT    Recommendations for Other Services    Frequency Min 3X/week   Progress towards PT Goals Progress towards PT goals: Progressing toward goals  Plan Current plan remains appropriate    Precautions / Restrictions Precautions Precautions: Fall   Pertinent Vitals/Pain Chronic back pain.    Mobility  Bed Mobility Bed Mobility: Supine to Sit;Sitting - Scoot to Edge of Bed Supine to Sit: 5: Supervision;HOB elevated Sitting - Scoot to Edge of Bed: 5: Supervision Details for Bed Mobility Assistance: Incr time. Transfers Sit to Stand: 4: Min guard;Without upper extremity assist;From bed;From chair/3-in-1;With armrests Stand to Sit: 5: Supervision;With upper extremity assist;With armrests;To chair/3-in-1 Details for Transfer Assistance: Verbal cues for hand placement on initial sit to stand. Ambulation/Gait Ambulation/Gait Assistance: 4: Min guard Ambulation Distance (Feet): 150 Feet Assistive device: Rolling walker Ambulation/Gait Assistance Details: Verbal cues to stand more erect. Pt unable to stand fully erect due to long term back problems. Gait Pattern: Step-through pattern;Decreased stride length;Trunk flexed;Wide base of support Gait velocity: decr    Exercises General Exercises - Upper Extremity Shoulder Flexion: AROM;Both;10 reps;Seated General Exercises - Lower Extremity Long Arc Quad: AROM;Both;10 reps;Seated Hip Flexion/Marching: AROM;Both;10  reps;Seated   PT Diagnosis:    PT Problem List:   PT Treatment Interventions:     PT Goals (current goals can now be found in the care plan section)    Visit Information  Last PT Received On: 01/11/13 Assistance Needed: +1 History of Present Illness: Pt adm with NSTEMI.    Subjective Data      Cognition  Cognition Arousal/Alertness: Awake/alert Behavior During Therapy: WFL for tasks assessed/performed Overall Cognitive Status: Within Functional Limits for tasks assessed    Balance  Balance Balance Assessed: Yes Static Standing Balance Static Standing - Balance Support: Bilateral upper extremity supported Static Standing - Level of Assistance: 5: Stand by assistance  End of Session PT - End of Session Equipment Utilized During Treatment: Gait belt Activity Tolerance: Patient tolerated treatment well Patient left: in chair;with call bell/phone within reach Nurse Communication: Mobility status   GP     [facility] 01/11/2013, 10:11 AM  [facility] PT [phone]

## 2013-01-11 NOTE — Progress Notes (Signed)
Patient ID: Marissa [name], female   DOB: [birth year], 79 y.o.   MRN: [id number]  Patient: Marissa [name] / Admit Date: 01/05/2013 / Date of Encounter: 01/11/2013, 8:40 AM   Subjective  No CP or SOB. Feels well. L side still a little sore from defib several days ago. Hot packs help     Objective   Telemetry: afib rate controlled Physical Exam: Filed Vitals:   01/10/13 0356  BP: 150/69  Pulse: 86  Temp: 98.1 F (36.7 C)  Resp: 20   General: Well developed, well nourished WF in no acute distress. Head: Normocephalic, atraumatic, sclera non-icteric, no xanthomas, nares are without discharge. Neck: Negative for carotid bruits. JVD not elevated. Lungs: Clear bilaterally to auscultation without wheezes, rales, or rhonchi. Breathing is unlabored. Heart: RRR S1 S2 without rubs or gallops. Soft SEM. Abdomen: Soft, non-tender, non-distended with normoactive bowel sounds. No hepatomegaly. No rebound/guarding. No obvious abdominal masses. Msk:  Strength and tone appear normal for age. Extremities: No clubbing or cyanosis. No edema. Distal RLE dressed.  Distal pedal pulses are 2+ and equal bilaterally. R groin without bruit, ecchymosis or oozing Neuro: Alert and oriented X 3. Moves all extremities spontaneously. Psych:  Responds to questions appropriately with a normal affect.    Intake/Output Summary (Last 24 hours) at 01/11/13 0840 Last data filed at 01/11/13 0600  Gross per 24 hour  Intake    655 ml  Output    850 ml  Net   -195 ml    Inpatient Medications:  . amiodarone  200 mg Oral BID  . atorvastatin  10 mg Oral Daily  . cephALEXin  500 mg Oral Q12H  . cholecalciferol  2,000 Units Oral Daily  . digoxin  0.0625 mg Oral Daily  . folic acid  1 mg Oral Daily  . furosemide  40 mg Oral Daily  . gabapentin  300 mg Oral QHS  . hydroxychloroquine  200 mg Oral Daily  . metoprolol tartrate  12.5 mg Oral BID  . potassium chloride  10 mEq Oral Daily  . predniSONE  5 mg Oral Daily  .  Warfarin - Pharmacist Dosing Inpatient   Does not apply q1800    Labs:  Recent Labs  01/10/13 0440 01/11/13 0600  NA 138 142  K 4.6 3.9  CL 102 105  CO2 30 27  GLUCOSE 94 93  BUN 25* 27*  CREATININE 1.20* 1.22*  CALCIUM 9.1 9.5    Recent Labs  01/10/13 0440 01/11/13 0600  WBC 10.1 10.2  HGB 11.4* 10.9*  HCT 35.5* 34.0*  MCV 95.4 94.7  PLT 187 187    Radiology/Studies:  Dg Chest Port 1 View  01/07/2013   *RADIOLOGY REPORT*  Clinical Data: CHF, weakness, CPR  PORTABLE CHEST - 1 VIEW  Comparison: 01/05/2013  Findings: Increased interstitial markings without frank interstitial edema.  Possible small left pleural effusion. Eventration of the right hemidiaphragm.  Cardiomegaly.  Defibrillator pads overlying the left hemithorax.  IMPRESSION: Increased interstitial markings without frank interstitial edema.  Possible small left pleural effusion.   Original Report Authenticated By: Charline Bills, M.D.   Dg Chest Portable 1 View  01/05/2013   *RADIOLOGY REPORT*  Clinical Data: Shortness of breath and chest pain.  PORTABLE CHEST - 1 VIEW  Comparison: PA and lateral chest 07/23/2011 and 08/03/2012.  Findings: There is cardiomegaly and mild interstitial edema.  Trace left pleural effusion is noted.  Chronic, mild elevation of the right hemidiaphragm relative to the left is noted.  No consolidative process or pneumothorax identified.  IMPRESSION: Cardiomegaly and mild interstitial edema with a small left effusion.   Original Report Authenticated By: Holley Dexter, M.D.     Assessment and Plan  1. NSTEMI secondary to acute OM3 stenosis. Vessel small and very tortuous. Appearance looks like spontaneous coronary dissection. Poorly suited for PCI. Will be managed medically. Coumadin resumed. Continue BB, statin.  2. Ventricular tachycardia s/p defibrillation secondary to acute MI. Event occurred during the first 48 hours related to ischemia. No recurrence. EF 40-45%. Amiodarone  transitioned to PO yesterday - will defer question of duration to MD.   3. E.coli UTI - resistant to Cipro, has since been switched to Keflex. Day 2/7  4. Atrial fibrillation, chronic. Rate controlled. On IV heparin->Coumadin. INR 1.34 this am    5. Acute on chronic CHF/NICM - EF has improved from 1/14 15-20%>> 40-45%. Appears euvolemic now. ARB held due to acute renal insufficiency. Consider resuming as Cr now stable.  6. Stasis ulcer right leg - Care per wound care nurse.   7. Hyperlipidemia - On lipitor.   8. RA with history of vasculitis - on home prednisone.    9. Acute renal failure on chronic kidney disease-  Baseline creatinine 1.19, remaining stable. Will drop KCl to once daily (was on daily at home per pt - this was with BID Lasix, but she is now on once-daily lasix.)  10. Deconditioning - PT eval. Pt open to SNF. Care management also following. Consider transfer to floor.   Patient indicates she wants to go home to [place] with home services  Charlton Haws, MD 01/11/2013 8:40 AM

## 2013-01-11 NOTE — Progress Notes (Signed)
Family request to speak to MD. Concern over Pt understand of treatment and condition. Family states Pt is forgetful.. ie Pt told family no MD came to see her. Pt states it is ok to discuss with daughter and son  Daughter Sheryl, home number best.  Son : Jeff  Numbers in Epic.   

## 2013-01-11 NOTE — Progress Notes (Signed)
ANTICOAGULATION CONSULT NOTE - Follow Up Consult  Pharmacy Consult for heparin and coumadin Indication: atrial fibrillation  Allergies  Allergen Reactions  . Codeine Other (See Comments)    Makes patient sleepy.  Marland Kitchen Penicillins Rash    Patient Measurements: Height: 5\' 5"  (165.1 cm) Weight: 173 lb 12.8 oz (78.835 kg) (b scale) IBW/kg (Calculated) : 57 Heparin Dosing Weight: 74 kg  Vital Signs: Temp: 98.4 F (36.9 C) (07/05 0558) Temp src: Oral (07/05 0558) BP: 136/64 mmHg (07/05 1012) Pulse Rate: 82 (07/05 1012)  Labs:  Recent Labs  01/09/13 0835 01/10/13 0440 01/11/13 0600  HGB 12.2 11.4* 10.9*  HCT 37.7 35.5* 34.0*  PLT 177 187 187  LABPROT 15.6* 15.3* 16.3*  INR 1.27 1.24 1.34  HEPARINUNFRC 0.52 0.49 0.38  CREATININE 1.18* 1.20* 1.22*    Estimated Creatinine Clearance: 40.1 ml/min (by C-G formula based on Cr of 1.22).  Assessment: Patient is a 79 y.o F on anticoagulation for Afib.  Heparin level is at goal with 0.38 this morning.  INR is subtherapeutic but trending up.  Will be conservative with coumadin dosing d/t potential for drug-drug intxn with amiodarone.  Goal of Therapy:  Heparin level 0.3-0.7 units/ml; INR 2-3 Monitor platelets by anticoagulation protocol: Yes   Plan:  1) repeat coumadin 5 mg PO x1 today 2) no change for heparin rate  Coury Grieger P 01/11/2013,12:11 PM

## 2013-01-11 NOTE — Progress Notes (Signed)
The patient watched the heart failure booklet this morning.  She also states that she is getting into and out of the bed to the [facility] a little easier this morning.

## 2013-01-11 NOTE — Progress Notes (Signed)
CARDIAC REHAB PHASE I   PRE:  Rate/Rhythm: 74 afib BP:  Supine: 125/49  Sitting:   Standing:    SaO2: 98%RA  MODE:  Ambulation: 168 ft   POST:  Rate/Rhythm: 81afib  BP:  Supine: 130/69  Sitting:   Standing:    SaO2: 95%RA 1255-1328 Pt had just gotten back to bed but willing to walk. Walked 168 ft on RA with gait belt use and rolling walker. Stopped many times due to right arm pain. Pt shook arms when resting and seemed to help with the discomfort. No further complaints once back in bed. Reviewed use of NTG and signs of angina with pt.and when to call 911.    Luetta Nutting, RN BSN  01/11/2013 1:24 PM

## 2013-01-12 LAB — PROTIME-INR: Prothrombin Time: 16.9 seconds — ABNORMAL HIGH (ref 11.6–15.2)

## 2013-01-12 LAB — BASIC METABOLIC PANEL
Calcium: 9.9 mg/dL (ref 8.4–10.5)
Chloride: 101 mEq/L (ref 96–112)
Creatinine, Ser: 1.26 mg/dL — ABNORMAL HIGH (ref 0.50–1.10)
GFR calc Af Amer: 46 mL/min — ABNORMAL LOW (ref >90)
GFR calc non Af Amer: 40 mL/min — ABNORMAL LOW (ref >90)

## 2013-01-12 LAB — CBC
MCH: 30.5 pg (ref 26.0–34.0)
MCHC: 31.6 g/dL (ref 30.0–36.0)
Platelets: 225 10*3/uL (ref 150–400)
RDW: 14.8 % (ref 11.5–15.5)

## 2013-01-12 LAB — HEPARIN LEVEL (UNFRACTIONATED): Heparin Unfractionated: 0.62 IU/mL (ref 0.30–0.70)

## 2013-01-12 MED ORDER — WARFARIN SODIUM 5 MG PO TABS
5.0000 mg | ORAL_TABLET | Freq: Once | ORAL | Status: AC
  Administered 2013-01-12: 5 mg via ORAL
  Filled 2013-01-12: qty 1

## 2013-01-12 NOTE — Progress Notes (Signed)
Patient having c/o bilateral lateral rib pain s/p compressions.  PRN Ultram and heat packs given to patient.  Patient currently resting comfortably and pain free. Will continue to monitor. Marissa [name]

## 2013-01-12 NOTE — Progress Notes (Signed)
ANTICOAGULATION CONSULT NOTE - Follow Up Consult  Pharmacy Consult for heparin and coumadin Indication: atrial fibrillation  Allergies  Allergen Reactions  . Codeine Other (See Comments)    Makes patient sleepy.  Marland Kitchen Penicillins Rash    Patient Measurements: Height: 5\' 5"  (165.1 cm) Weight: 173 lb 1 oz (78.5 kg) (scale b) IBW/kg (Calculated) : 57 Heparin Dosing Weight: 74 kg  Vital Signs: Temp: 98.1 F (36.7 C) (07/06 0900) Temp src: Oral (07/06 0900) BP: 112/50 mmHg (07/06 0900) Pulse Rate: 73 (07/06 0900)  Labs:  Recent Labs  01/10/13 0440 01/11/13 0600 01/12/13 0540  HGB 11.4* 10.9* 11.9*  HCT 35.5* 34.0* 37.6  PLT 187 187 225  LABPROT 15.3* 16.3* 16.9*  INR 1.24 1.34 1.41  HEPARINUNFRC 0.49 0.38 0.62  CREATININE 1.20* 1.22* 1.26*    Estimated Creatinine Clearance: 38.7 ml/min (by C-G formula based on Cr of 1.26).  Assessment: Marissa [name] is a 79 y.o F on anticoagulation for Afib.  Heparin level is at goal.  INR is subtherapeutic but is trending up towards goal range. No bleeding documented  Goal of Therapy:  Heparin level 0.3-0.7 units/ml: INR 2-3 Monitor platelets by anticoagulation protocol: Yes   Plan:  1) repeat coumadin 5 mg PO x1 today-- will plan on increasing  dose on 7/07 if INR cont to rise slowly 2) no change for heparin rate  Marissa [name] P 01/12/2013,11:05 AM

## 2013-01-12 NOTE — Plan of Care (Signed)
Problem: Phase I Progression Outcomes Goal: EF % per last Echo/documented,Core Reminder form on chart Outcome: Completed/Met Date Met:  01/12/13 EF 40-45%

## 2013-01-12 NOTE — Progress Notes (Signed)
Patient ID: Marissa [name], female   DOB: [birth year], 79 y.o.   MRN: [id number]  Patient: Marissa [name] / Admit Date: 01/05/2013 / Date of Encounter: 01/12/2013, 8:36 AM   Subjective  No CP or SOB. Feels well. L side still a little sore from defib several days ago. Hot packs help     Objective   Telemetry: afib rate controlled Physical Exam: Filed Vitals:   01/10/13 0356  BP: 150/69  Pulse: 86  Temp: 98.1 F (36.7 C)  Resp: 20   General: Well developed, well nourished WF in no acute distress. Head: Normocephalic, atraumatic, sclera non-icteric, no xanthomas, nares are without discharge. Neck: Negative for carotid bruits. JVD not elevated. Lungs: Clear bilaterally to auscultation without wheezes, rales, or rhonchi. Breathing is unlabored. Heart: RRR S1 S2 without rubs or gallops. Soft SEM. Abdomen: Soft, non-tender, non-distended with normoactive bowel sounds. No hepatomegaly. No rebound/guarding. No obvious abdominal masses. Msk:  Strength and tone appear normal for age. Extremities: No clubbing or cyanosis. No edema. Distal RLE dressed.  Distal pedal pulses are 2+ and equal bilaterally. R groin without bruit, ecchymosis or oozing Neuro: Alert and oriented X 3. Moves all extremities spontaneously. Psych:  Responds to questions appropriately with a normal affect.    Intake/Output Summary (Last 24 hours) at 01/12/13 0836 Last data filed at 01/12/13 4098  Gross per 24 hour  Intake 2524.17 ml  Output   1625 ml  Net 899.17 ml    Inpatient Medications:  . amiodarone  200 mg Oral BID  . atorvastatin  10 mg Oral Daily  . cephALEXin  500 mg Oral Q12H  . cholecalciferol  2,000 Units Oral Daily  . digoxin  0.0625 mg Oral Daily  . folic acid  1 mg Oral Daily  . furosemide  40 mg Oral Daily  . gabapentin  300 mg Oral QHS  . hydroxychloroquine  200 mg Oral Daily  . metoprolol tartrate  12.5 mg Oral BID  . potassium chloride  10 mEq Oral Daily  . predniSONE  5 mg Oral Daily  .  Warfarin - Pharmacist Dosing Inpatient   Does not apply q1800    Labs:  Recent Labs  01/11/13 0600 01/12/13 0540  NA 142 139  K 3.9 4.0  CL 105 101  CO2 27 26  GLUCOSE 93 83  BUN 27* 28*  CREATININE 1.22* 1.26*  CALCIUM 9.5 9.9    Recent Labs  01/11/13 0600 01/12/13 0540  WBC 10.2 11.7*  HGB 10.9* 11.9*  HCT 34.0* 37.6  MCV 94.7 96.4  PLT 187 225    Radiology/Studies:  Dg Chest Port 1 View  01/07/2013   *RADIOLOGY REPORT*  Clinical Data: CHF, weakness, CPR  PORTABLE CHEST - 1 VIEW  Comparison: 01/05/2013  Findings: Increased interstitial markings without frank interstitial edema.  Possible small left pleural effusion. Eventration of the right hemidiaphragm.  Cardiomegaly.  Defibrillator pads overlying the left hemithorax.  IMPRESSION: Increased interstitial markings without frank interstitial edema.  Possible small left pleural effusion.   Original Report Authenticated By: Charline Bills, M.D.   Dg Chest Portable 1 View  01/05/2013   *RADIOLOGY REPORT*  Clinical Data: Shortness of breath and chest pain.  PORTABLE CHEST - 1 VIEW  Comparison: PA and lateral chest 07/23/2011 and 08/03/2012.  Findings: There is cardiomegaly and mild interstitial edema.  Trace left pleural effusion is noted.  Chronic, mild elevation of the right hemidiaphragm relative to the left is noted.  No consolidative process or pneumothorax  identified.  IMPRESSION: Cardiomegaly and mild interstitial edema with a small left effusion.   Original Report Authenticated By: Holley Dexter, M.D.     Assessment and Plan  1. NSTEMI secondary to acute OM3 stenosis. Vessel small and very tortuous. Appearance looks like spontaneous coronary dissection. Poorly suited for PCI. Will be managed medically. Coumadin resumed. Continue BB, statin.  2. Ventricular tachycardia s/p defibrillation secondary to acute MI. Event occurred during the first 48 hours related to ischemia. No recurrence. EF 40-45%. Amiodarone  transitioned to PO   3. E.coli UTI - resistant to Cipro, has since been switched to Keflex. Day 3/7  4. Atrial fibrillation, chronic. Rate controlled. On IV heparin->Coumadin. INR 1.41 this am    5. Acute on chronic CHF/NICM - EF has improved from 1/14 15-20%>> 40-45%. Appears euvolemic now. ARB held due to acute renal insufficiency. Consider resuming as Cr now stable.  6. Stasis ulcer right leg - Care per wound care nurse.   7. Hyperlipidemia - On lipitor.   8. RA with history of vasculitis - on home prednisone.    9. Acute renal failure on chronic kidney disease-  Baseline creatinine 1.19, remaining stable. Will drop KCl to once daily (was on daily at home per pt - this was with BID Lasix, but she is now on once-daily lasix.)  10. Deconditioning - PT    Patient indicates she wants to go home to [place] with home services  Would have social services arrange these Monday and plan discharge Tuesday when INR should be Rx  Charlton Haws, MD 01/12/2013 8:36 AM

## 2013-01-13 ENCOUNTER — Inpatient Hospital Stay (HOSPITAL_COMMUNITY): Admission: RE | Admit: 2013-01-13 | Payer: Medicare Other | Source: Ambulatory Visit | Admitting: *Deleted

## 2013-01-13 LAB — CBC
MCH: 31.1 pg (ref 26.0–34.0)
MCHC: 32.8 g/dL (ref 30.0–36.0)
MCV: 94.8 fL (ref 78.0–100.0)
Platelets: 224 10*3/uL (ref 150–400)

## 2013-01-13 LAB — PROTIME-INR: Prothrombin Time: 19 seconds — ABNORMAL HIGH (ref 11.6–15.2)

## 2013-01-13 LAB — BASIC METABOLIC PANEL
BUN: 34 mg/dL — ABNORMAL HIGH (ref 6–23)
Calcium: 9.6 mg/dL (ref 8.4–10.5)
Creatinine, Ser: 1.37 mg/dL — ABNORMAL HIGH (ref 0.50–1.10)
GFR calc Af Amer: 42 mL/min — ABNORMAL LOW (ref >90)

## 2013-01-13 LAB — HEPARIN LEVEL (UNFRACTIONATED): Heparin Unfractionated: 0.9 IU/mL — ABNORMAL HIGH (ref 0.30–0.70)

## 2013-01-13 MED ORDER — WARFARIN SODIUM 5 MG PO TABS
5.0000 mg | ORAL_TABLET | Freq: Once | ORAL | Status: AC
  Administered 2013-01-13: 5 mg via ORAL
  Filled 2013-01-13: qty 1

## 2013-01-13 NOTE — Progress Notes (Signed)
HR monitor Alarm 145. Reviewed strip w/ charge RN. Looks to be a bad reading. Pt was asym setting on the edge of bed brushing teeth. Pt states they didn't feel heart race or any change. Will continue monitor .

## 2013-01-13 NOTE — Progress Notes (Signed)
Family request to speak to MD. Concern over Pt understand of treatment and condition. Family states Pt is forgetful.. ie Pt told family no MD came to see her. Pt states it is ok to discuss with daughter and son  Daughter Tora Duck, home number best.  Son : Trey Paula  Numbers in [place].

## 2013-01-13 NOTE — Progress Notes (Signed)
Dayvin Aber, PTA [phone] 01/13/2013  

## 2013-01-13 NOTE — Progress Notes (Signed)
Pt setting on chair on complaints of pain or sob. Hep level 13 ml/hr will continue plain of care

## 2013-01-13 NOTE — Progress Notes (Signed)
Physical Therapy Treatment Patient Details Name: Marissa [name] MRN: [id number] DOB: 02-Sep-1932 Today's Date: 01/13/2013 Time: 1150-1205 PT Time Calculation (min): 15 min  PT Assessment / Plan / Recommendation  PT Comments   Patient is making progress toward all PT goals. Patient is requiring less assistance and cueing when performing functional activities. She will benefit from HHPT in order to increase her strength, mobility, and functional independence.   Follow Up Recommendations  Home health PT;Supervision - Intermittent     Does the patient have the potential to tolerate intense rehabilitation     Barriers to Discharge        Equipment Recommendations  None recommended by PT    Recommendations for Other Services    Frequency Min 3X/week   Progress towards PT Goals Progress towards PT goals: Progressing toward goals  Plan Current plan remains appropriate    Precautions / Restrictions Precautions Precautions: Fall Restrictions Weight Bearing Restrictions: No   Pertinent Vitals/Pain No c/o pain during today's Tx.    Mobility  Bed Mobility Bed Mobility: Not assessed Transfers Transfers: Sit to Stand;Stand to Sit Sit to Stand: 5: Supervision;With upper extremity assist;With armrests;From chair/3-in-1 Stand to Sit: 5: Supervision;With upper extremity assist;With armrests;To chair/3-in-1 Details for Transfer Assistance: VC for hand placement Ambulation/Gait Ambulation/Gait Assistance: 4: Min guard Ambulation Distance (Feet): 180 Feet Assistive device: Rolling walker Ambulation/Gait Assistance Details: Cues for upright posture. Patient has moderate trunk flexion and small lateral lean to R. Patient unable to maintain correct posture despite cueing. Gait Pattern: Step-through pattern;Decreased stride length;Trunk flexed;Wide base of support Gait velocity: decr Stairs: No Wheelchair Mobility Wheelchair Mobility: No    Exercises General Exercises - Lower  Extremity Ankle Circles/Pumps: AROM;Both;10 reps;Seated Long Arc Quad: AROM;Both;10 reps;Seated      PT Goals (current goals can now be found in the care plan section) Acute Rehab PT Goals Time For Goal Achievement: 01/17/13 Potential to Achieve Goals: Good  Visit Information  Last PT Received On: 01/13/13 Assistance Needed: +1 History of Present Illness: Pt adm with NSTEMI.    Subjective Data      Cognition  Cognition Arousal/Alertness: Awake/alert Behavior During Therapy: WFL for tasks assessed/performed Overall Cognitive Status: Within Functional Limits for tasks assessed    Balance     End of Session PT - End of Session Equipment Utilized During Treatment: Gait belt Activity Tolerance: Patient tolerated treatment well Patient left: in chair;with call bell/phone within reach Nurse Communication: Mobility status   GP     Jolyn Nap, SPTA 01/13/2013, 12:55 PM

## 2013-01-13 NOTE — Progress Notes (Addendum)
ANTICOAGULATION CONSULT NOTE - Follow Up Consult  Pharmacy Consult for Heparin / Coumadin Indication: atrial fibrillation  Labs:  Recent Labs  01/11/13 0600 01/12/13 0540 01/13/13 0355  HGB 10.9* 11.9* 10.7*  HCT 34.0* 37.6 32.6*  PLT 187 225 224  LABPROT 16.3* 16.9* 19.0*  INR 1.34 1.41 1.64*  HEPARINUNFRC 0.38 0.62 0.90*  CREATININE 1.22* 1.26* 1.37*    Assessment: 79yo female now supratherapeutic on heparin after several levels at goal at current rate though had been trending up.  INR slowly increasing  Goal of Therapy:  Heparin level 0.3-0.7 units/ml   Plan:  Will decrease heparin gtt by ~2 units/kg/hr to 1300 units/hr and check level in 8hr. Coumadin 5 mg po x 1 today  Vernard Gambles, PharmD, BCPS  01/13/2013,5:26 AM

## 2013-01-13 NOTE — Progress Notes (Signed)
Rt leg dressing change per PT request

## 2013-01-13 NOTE — Progress Notes (Signed)
CARDIAC REHAB PHASE I   PRE:  Rate/Rhythm: 74 afib  BP:  Supine:   Sitting: 127/57  Standing:    SaO2: 97%RA  MODE:  Ambulation: 193 ft   POST:  Rate/Rhythm: 74  BP:  Supine:   Sitting: 137/55  Standing:    SaO2: 94%RA 1040-1124 Pt walked 193 ft on RA with rolling walker and gait belt use and asst x 1. Right arm started feeling tired and with discomfort at very end of walk. Pt denied that this sensation felt like CP that she was admitted with. To recliner after walk. VSS. Could not find pt's MI booklet so I gave her a new one and low sodium diets and a heart healthy diet. Encouraged pt to adhere to 2000 mg sodium. Discussed foods high in sodium.  Will continue ed tomorrow.   Luetta Nutting, RN BSN  01/13/2013 11:21 AM

## 2013-01-13 NOTE — Progress Notes (Signed)
ANTICOAGULATION CONSULT NOTE - Follow Up Consult  Pharmacy Consult for Heparin / Coumadin Indication: atrial fibrillation  Labs:  Recent Labs  01/11/13 0600 01/12/13 0540 01/13/13 0355 01/13/13 1226  HGB 10.9* 11.9* 10.7*  --   HCT 34.0* 37.6 32.6*  --   PLT 187 225 224  --   LABPROT 16.3* 16.9* 19.0*  --   INR 1.34 1.41 1.64*  --   HEPARINUNFRC 0.38 0.62 0.90* 0.68  CREATININE 1.22* 1.26* 1.37*  --     Assessment: 79yo female now therapeutic on heparin after several levels at goal at current rate though had been trending up.   Goal of Therapy:  Heparin level 0.3-0.7 units/ml   Plan:  1) Continue heparin at 1300 units / hr 2) Follow up AM heparin level, INR  Thank you. Okey Regal, PharmD [phone] 01/13/2013,1:06 PM

## 2013-01-13 NOTE — Progress Notes (Signed)
Patient Name: Marissa [name]      SUBJECTIVE: NICM eF 15-20% 2/2 RA and vasculitis, chronic HFrEF and Atrial fib  Course complicated by polymorphic Vtach>>CPR and shock Day2  Cath>>OM3 95% and Rx medically;  EF 40-45%   Past Medical History  Diagnosis Date  . Atrial fibrillation     A.  Chronic Coumadin  . MVP (mitral valve prolapse)   . Hypertension   . Nonischemic cardiomyopathy 2001    A.  07/23/11 - Echo: EF 35%;  B. 07/24/2011 - Cath: nonobs dzs ef 35%, 2+MR   . Systolic CHF, chronic     A. Diag 07/2011, EF 35%  . Lumbar disc disease     laminectomy 2001  . History of endometrial cancer   . Hypertension   . Hyperlipidemia   . Rheumatoid arthritis     treated in past with Remicade and developed vasculitis  . Gout   . Complication of anesthesia   . PONV (postoperative nausea and vomiting)   . Myocardial infarction 01/06/2013    nestmi  . Shortness of breath   . GERD (gastroesophageal reflux disease)     Scheduled Meds:  Scheduled Meds: . amiodarone  200 mg Oral BID  . atorvastatin  10 mg Oral Daily  . cephALEXin  500 mg Oral Q12H  . cholecalciferol  2,000 Units Oral Daily  . digoxin  0.0625 mg Oral Daily  . folic acid  1 mg Oral Daily  . furosemide  40 mg Oral Daily  . gabapentin  300 mg Oral QHS  . hydroxychloroquine  200 mg Oral Daily  . metoprolol tartrate  12.5 mg Oral BID  . potassium chloride  10 mEq Oral Daily  . predniSONE  5 mg Oral Daily  . Warfarin - Pharmacist Dosing Inpatient   Does not apply q1800   Continuous Infusions: . sodium chloride 10 mL/hr (01/11/13 1210)  . heparin 1,300 Units/hr (01/13/13 0529)    PHYSICAL EXAM Filed Vitals:   01/12/13 0900 01/12/13 1337 01/12/13 2057 01/13/13 0451  BP: 112/50 117/49 117/47 131/57  Pulse: 73 68 66 70  Temp: 98.1 F (36.7 C) 97.9 F (36.6 C) 97.6 F (36.4 C) 97.6 F (36.4 C)  TempSrc: Oral Oral Oral Oral  Resp: 20 20 20 20   Height:      Weight:    173 lb 11.2 oz (78.79 kg)  SpO2: 97% 98%  98% 98%    Well developed and nourished in no acute distress HENT normal Neck supple with JVP-8-9 Carotids brisk and full without bruits Clear Irregularly irregular rate and rhythm with controlled ventricular response, no murmurs or gallops Abd-soft with active BS without hepatomegaly No Clubbing cyanosis tr edema Skin-warm and dry A & Oriented  Grossly normal sensory and motor function  TELEMETRY: Reviewed telemetry pt in  afib   Intake/Output Summary (Last 24 hours) at 01/13/13 0820 Last data filed at 01/13/13 0736  Gross per 24 hour  Intake 1315.63 ml  Output   2452 ml  Net -1136.37 ml    LABS: Basic Metabolic Panel:  Recent Labs Lab 01/06/13 1313  01/07/13 0430 01/08/13 0445 01/09/13 0835 01/10/13 0440 01/11/13 0600 01/12/13 0540 01/13/13 0355  NA  --   < > 143 144 139 138 142 139 141  K  --   < > 4.1 3.6 4.3 4.6 3.9 4.0 4.4  CL  --   < > 103 104 100 102 105 101 105  CO2  --   < >  34* 31 31 30 27 26 30   GLUCOSE  --   < > 107* 99 126* 94 93 83 89  BUN  --   < > 32* 30* 26* 25* 27* 28* 34*  CREATININE  --   < > 1.66* 1.37* 1.18* 1.20* 1.22* 1.26* 1.37*  CALCIUM  --   < > 9.0 8.8 9.4 9.1 9.5 9.9 9.6  MG 2.4  --   --   --   --   --   --   --   --   < > = values in this interval not displayed. Cardiac Enzymes: No results found for this basename: CKTOTAL, CKMB, CKMBINDEX, TROPONINI,  in the last 72 hours CBC:  Recent Labs Lab 01/07/13 0430 01/08/13 0445 01/09/13 0835 01/10/13 0440 01/11/13 0600 01/12/13 0540 01/13/13 0355  WBC 10.7* 10.1 9.5 10.1 10.2 11.7* 10.2  HGB 11.5* 11.2* 12.2 11.4* 10.9* 11.9* 10.7*  HCT 36.2 34.7* 37.7 35.5* 34.0* 37.6 32.6*  MCV 97.1 95.9 96.2 95.4 94.7 96.4 94.8  PLT 163 147* 177 187 187 225 224   PROTIME:  Recent Labs  01/11/13 0600 01/12/13 0540 01/13/13 0355  LABPROT 16.3* 16.9* 19.0*  INR 1.34 1.41 1.64*   Liver Function Tests: No results found for this basename: AST, ALT, ALKPHOS, BILITOT, PROT, ALBUMIN,  in  the last 72 hours No results found for this basename: LIPASE, AMYLASE,  in the last 72 hours BNP: BNP (last 3 results)  Recent Labs  08/03/12 0840 01/05/13 1903  PROBNP 10951.0* 3772.0*     ASSESSMENT AND PLAN:  NSTEMI secondary to acute OM3 stenosis. Vessel small and very tortuous. Appearance looks like spontaneous coronary dissection. Poorly suited for PCI. Will be managed medically. Coumadin resumed. Continue BB, statin.  2. Ventricular tachycardia s/p defibrillation secondary to acute MI. Event occurred during the first 48 hours related to ischemia. No recurrence. EF 40-45%. D/C amio 3. E.coli UTI - resistant to Cipro, has since been switched to Keflex. Day 47  4. Atrial fibrillation, chronic. Rate controlled. On IV heparin->Coumadin. INR 1.34 this am  5. Acute on chronic CHF/NICM - EF has improved from 1/14 15-20%>> 40-45%. Appears euvolemic now. ARB held due to acute renal insufficiency. Consider resuming as Cr now stable.  6. Stasis ulcer right leg - Care per wound care nurse.  7. Hyperlipidemia - On lipitor.  8. RA with history of vasculitis - on home predniso   Signed, Sherryl Manges MD  01/13/2013 Ambulate   Will hold off on ace with improved EF chanbege betablocker to coreg Hopefully hose in 24-48 hrs Cont dig

## 2013-01-14 LAB — CBC
HCT: 34.1 % — ABNORMAL LOW (ref 36.0–46.0)
MCH: 30.8 pg (ref 26.0–34.0)
MCV: 96.3 fL (ref 78.0–100.0)
RBC: 3.54 MIL/uL — ABNORMAL LOW (ref 3.87–5.11)
RDW: 14.9 % (ref 11.5–15.5)
WBC: 9.7 10*3/uL (ref 4.0–10.5)

## 2013-01-14 LAB — BASIC METABOLIC PANEL
BUN: 35 mg/dL — ABNORMAL HIGH (ref 6–23)
Creatinine, Ser: 1.41 mg/dL — ABNORMAL HIGH (ref 0.50–1.10)
GFR calc non Af Amer: 35 mL/min — ABNORMAL LOW (ref >90)
Glucose, Bld: 84 mg/dL (ref 70–99)
Potassium: 4.4 mEq/L (ref 3.5–5.1)

## 2013-01-14 MED ORDER — WARFARIN SODIUM 5 MG PO TABS
5.0000 mg | ORAL_TABLET | Freq: Once | ORAL | Status: AC
  Administered 2013-01-14: 5 mg via ORAL
  Filled 2013-01-14: qty 1

## 2013-01-14 MED ORDER — CARVEDILOL 3.125 MG PO TABS
3.1250 mg | ORAL_TABLET | Freq: Two times a day (BID) | ORAL | Status: DC
  Administered 2013-01-14 – 2013-01-16 (×4): 3.125 mg via ORAL
  Filled 2013-01-14 (×7): qty 1

## 2013-01-14 NOTE — Progress Notes (Signed)
CARDIAC REHAB PHASE I   PRE:  Rate/Rhythm: 71 Afib  BP:  Supine:   Sitting: 139/58  Standing:    SaO2: 99 RA  MODE:  Ambulation: 204 ft   POST:  Rate/Rhythm: 78  BP:  Supine:   Sitting: 135/69  Standing:    SaO2: 96 RA [id number] Assisted X 1 used walker and gait belt to ambulate.Gait steady with walker. Pt leans forwardwith walking and tires as she walks. She does c/o of right arm pain with walking, but states that it is less today than yesterday. Pt to recliner after walk with call light in reach.  Melina Copa RN 01/14/2013 9:53 AM

## 2013-01-14 NOTE — Progress Notes (Signed)
Patient Name: Marissa [name]      SUBJECTIVE: NICM eF 15-20% 2/2 RA and vasculitis, chronic HFrEF and Atrial fib  Course complicated by polymorphic Vtach>>CPR and shock Day2  Cath>>OM3 95% and Rx medically;  EF 40-45%   Concern from family re communciation  Past Medical History  Diagnosis Date  . Atrial fibrillation     A.  Chronic Coumadin  . MVP (mitral valve prolapse)   . Hypertension   . Nonischemic cardiomyopathy 2001    A.  07/23/11 - Echo: EF 35%;  B. 07/24/2011 - Cath: nonobs dzs ef 35%, 2+MR   . Systolic CHF, chronic     A. Diag 07/2011, EF 35%  . Lumbar disc disease     laminectomy 2001  . History of endometrial cancer   . Hypertension   . Hyperlipidemia   . Rheumatoid arthritis     treated in past with Remicade and developed vasculitis  . Gout   . Complication of anesthesia   . PONV (postoperative nausea and vomiting)   . Myocardial infarction 01/06/2013    nestmi  . Shortness of breath   . GERD (gastroesophageal reflux disease)     Scheduled Meds:  Scheduled Meds: . atorvastatin  10 mg Oral Daily  . cephALEXin  500 mg Oral Q12H  . cholecalciferol  2,000 Units Oral Daily  . digoxin  0.0625 mg Oral Daily  . folic acid  1 mg Oral Daily  . furosemide  40 mg Oral Daily  . gabapentin  300 mg Oral QHS  . hydroxychloroquine  200 mg Oral Daily  . metoprolol tartrate  12.5 mg Oral BID  . potassium chloride  10 mEq Oral Daily  . predniSONE  5 mg Oral Daily  . Warfarin - Pharmacist Dosing Inpatient   Does not apply q1800   Continuous Infusions: . sodium chloride 10 mL/hr (01/11/13 1210)  . heparin 1,300 Units/hr (01/13/13 2230)    PHYSICAL EXAM Filed Vitals:   01/13/13 0906 01/13/13 1412 01/13/13 2116 01/14/13 0610  BP: 116/43 124/42 149/60 143/69  Pulse: 64 68 90 69  Temp:  98.7 F (37.1 C) 98.2 F (36.8 C) 98.4 F (36.9 C)  TempSrc:  Oral Oral Oral  Resp: 20 19 16 16   Height:      Weight:    171 lb 3.2 oz (77.656 kg)  SpO2: 100% 95% 99% 98%     Well developed and nourished in no acute distress HENT normal Neck supple with JVP-flat Carotids brisk and full without bruits Clear Irregularly irregular rate and rhythm with controlled ventricular response, no murmurs or gallops Abd-soft with active BS without hepatomegaly No Clubbing cyanosis tr edema Skin-warm and dry A & Oriented  Grossly normal sensory and motor function  TELEMETRY: Reviewed telemetry pt in  afib   Intake/Output Summary (Last 24 hours) at 01/14/13 0846 Last data filed at 01/14/13 0727  Gross per 24 hour  Intake    800 ml  Output   1500 ml  Net   -700 ml    LABS: Basic Metabolic Panel:  Recent Labs Lab 01/08/13 0445 01/09/13 0835 01/10/13 0440 01/11/13 0600 01/12/13 0540 01/13/13 0355 01/14/13 0405  NA 144 139 138 142 139 141 143  K 3.6 4.3 4.6 3.9 4.0 4.4 4.4  CL 104 100 102 105 101 105 105  CO2 31 31 30 27 26 30 30   GLUCOSE 99 126* 94 93 83 89 84  BUN 30* 26* 25* 27* 28* 34* 35*  CREATININE  1.37* 1.18* 1.20* 1.22* 1.26* 1.37* 1.41*  CALCIUM 8.8 9.4 9.1 9.5 9.9 9.6 9.7   Cardiac Enzymes: No results found for this basename: CKTOTAL, CKMB, CKMBINDEX, TROPONINI,  in the last 72 hours CBC:  Recent Labs Lab 01/08/13 0445 01/09/13 0835 01/10/13 0440 01/11/13 0600 01/12/13 0540 01/13/13 0355 01/14/13 0405  WBC 10.1 9.5 10.1 10.2 11.7* 10.2 9.7  HGB 11.2* 12.2 11.4* 10.9* 11.9* 10.7* 10.9*  HCT 34.7* 37.7 35.5* 34.0* 37.6 32.6* 34.1*  MCV 95.9 96.2 95.4 94.7 96.4 94.8 96.3  PLT 147* 177 187 187 225 224 252   PROTIME:  Recent Labs  01/12/13 0540 01/13/13 0355 01/14/13 0405  LABPROT 16.9* 19.0* 19.7*  INR 1.41 1.64* 1.72*   Liver Function Tests: No results found for this basename: AST, ALT, ALKPHOS, BILITOT, PROT, ALBUMIN,  in the last 72 hours No results found for this basename: LIPASE, AMYLASE,  in the last 72 hours BNP: BNP (last 3 results)  Recent Labs  08/03/12 0840 01/05/13 1903  PROBNP 10951.0* 3772.0*      ASSESSMENT AND PLAN:  NSTEMI secondary to acute OM3 stenosis. Vessel small and very tortuous. Appearance looks like spontaneous coronary dissection. Poorly suited for PCI. Will be managed medically. Coumadin resumed. Continue BB, statin.  2. Ventricular tachycardia s/p defibrillation secondary to acute MI.  . EF 40-45%. D/Ced amio 3. E.coli UTI - resistant to Cipro, has since been switched to Keflex. Da 5/7  4. Atrial fibrillation, chronic. Rate controlled. On IV heparin->Coumadin. INR 1.72 this am  5. Acute on chronic CHF/NICM - EF has improved from 1/14 15-20%>> 40-45%.  6. Stasis ulcer right leg - Care per wound care nurse.  7. Hyperlipidemia - On lipitor.  8. RA with history of vasculitis - on home predniso   Signed, Sherryl Manges MD  01/14/2013 Ambulate   Will hold off on ace with improved EF change betablocker to coreg Hopefully hose in 24-48 hrs Cont dig

## 2013-01-14 NOTE — Progress Notes (Signed)
Nt did not chart Intake for lunch. Rn going off what PT said 

## 2013-01-14 NOTE — Progress Notes (Signed)
ANTICOAGULATION CONSULT NOTE - Follow Up Consult  Pharmacy Consult for Heparin / Coumadin Indication: atrial fibrillation  Labs:  Recent Labs  01/12/13 0540 01/13/13 0355 01/13/13 1226 01/14/13 0405  HGB 11.9* 10.7*  --  10.9*  HCT 37.6 32.6*  --  34.1*  PLT 225 224  --  252  LABPROT 16.9* 19.0*  --  19.7*  INR 1.41 1.64*  --  1.72*  HEPARINUNFRC 0.62 0.90* 0.68 0.58  CREATININE 1.26* 1.37*  --  1.41*    Assessment: 79yo female now therapeutic on heparin.  INR continues to slowly increase.  NO bleeding noted  Goal of Therapy:  Heparin level 0.3-0.7 units/ml   Plan:  1) Continue heparin at 1300 units / hr 2) Follow up AM heparin level, INR 3) Coumadin 5 mg po x 1 dose today  Thank you. Okey Regal, PharmD [phone] 01/14/2013,8:52 AM

## 2013-01-14 NOTE — Consult Note (Signed)
WOC follow-up: Bedside nurse changed right leg dressing yesterday per pt request.  Dressings can be changed Q Mon/Thurs.  Continue present plan of care with Aquacel and ace wrap for light compression.  Pt can resume previous plan of care with outpatient wound care center after discharge. Please re-consult if further assistance is needed.  Thank-you,  Cammie Mcgee MSN, RN, CWOCN, [place], CNS [phone]

## 2013-01-15 LAB — CBC
HCT: 35.6 % — ABNORMAL LOW (ref 36.0–46.0)
MCV: 95.2 fL (ref 78.0–100.0)
RBC: 3.74 MIL/uL — ABNORMAL LOW (ref 3.87–5.11)
WBC: 8.6 10*3/uL (ref 4.0–10.5)

## 2013-01-15 LAB — BASIC METABOLIC PANEL
BUN: 35 mg/dL — ABNORMAL HIGH (ref 6–23)
Chloride: 108 mEq/L (ref 96–112)
Glucose, Bld: 84 mg/dL (ref 70–99)
Potassium: 4.2 mEq/L (ref 3.5–5.1)

## 2013-01-15 MED ORDER — WARFARIN SODIUM 7.5 MG PO TABS
7.5000 mg | ORAL_TABLET | Freq: Once | ORAL | Status: AC
  Administered 2013-01-15: 7.5 mg via ORAL
  Filled 2013-01-15: qty 1

## 2013-01-15 NOTE — Progress Notes (Addendum)
ANTICOAGULATION CONSULT NOTE - Follow Up Consult  Pharmacy Consult for Heparin / Coumadin Indication: atrial fibrillation  Labs:  Recent Labs  01/13/13 0355 01/13/13 1226 01/14/13 0405 01/15/13 0745  HGB 10.7*  --  10.9* 11.4*  HCT 32.6*  --  34.1* 35.6*  PLT 224  --  252 250  LABPROT 19.0*  --  19.7* 20.2*  INR 1.64*  --  1.72* 1.78*  HEPARINUNFRC 0.90* 0.68 0.58 1.10*  CREATININE 1.37*  --  1.41* 1.48*    Assessment: 79yo female now supra-therapeutic on heparin.  INR continues to slowly increase.  NO bleeding noted  Goal of Therapy:  Heparin level 0.3-0.7 units/ml   Plan:  1) Hold heparin x 1 hour, decrease to 1050 units / hr 2) Follow up 8 hour heparin level 3) Coumadin 7.5 mg po x 1 dose today 4) Follow up AM labs  Thank you. Okey Regal, PharmD [phone] 01/15/2013,9:15 AM

## 2013-01-15 NOTE — Progress Notes (Signed)
Pt ambulated with cardiac rehab today, tolerated well, spoke with daughter, remains alert and oriented, NAD noted

## 2013-01-15 NOTE — Progress Notes (Signed)
Patient ID: KAEYA SCHIFFER, female   DOB: 12-May-1933, 79 y.o.   MRN: [id number]   Patient Name: Marissa [name] Date of Encounter: 01/15/2013    SUBJECTIVE No complaints today. Up and walking with good stability. Her INR is not quite therapeutic yet.  CURRENT MEDS . atorvastatin  10 mg Oral Daily  . carvedilol  3.125 mg Oral BID WC  . cephALEXin  500 mg Oral Q12H  . cholecalciferol  2,000 Units Oral Daily  . digoxin  0.0625 mg Oral Daily  . folic acid  1 mg Oral Daily  . furosemide  40 mg Oral Daily  . gabapentin  300 mg Oral QHS  . hydroxychloroquine  200 mg Oral Daily  . potassium chloride  10 mEq Oral Daily  . predniSONE  5 mg Oral Daily  . Warfarin - Pharmacist Dosing Inpatient   Does not apply q1800    OBJECTIVE  Filed Vitals:   01/14/13 1032 01/14/13 1300 01/14/13 2128 01/15/13 0447  BP: 103/50 121/59 130/55 126/61  Pulse: 76 72 74 63  Temp:  98.4 F (36.9 C) 98.6 F (37 C) 98 F (36.7 C)  TempSrc:  Oral Oral Oral  Resp:  16 16 17   Height:      Weight:    169 lb 12.8 oz (77.021 kg)  SpO2:  95% 96% 97%    Intake/Output Summary (Last 24 hours) at 01/15/13 0903 Last data filed at 01/15/13 0700  Gross per 24 hour  Intake 2620.91 ml  Output   2025 ml  Net 595.91 ml   Filed Weights   01/13/13 0451 01/14/13 0610 01/15/13 0447  Weight: 173 lb 11.2 oz (78.79 kg) 171 lb 3.2 oz (77.656 kg) 169 lb 12.8 oz (77.021 kg)    PHYSICAL EXAM  General: Pleasant, NAD. Chronically ill Neuro: Alert and oriented X 3. Moves all extremities spontaneously. Psych: Normal affect. HEENT:  Normal  Neck: Supple without bruits or JVD. Lungs:  Resp regular and unlabored, CTA. Heart: Irregular rate and rhythm, no s3, s4, or murmurs. Abdomen: Soft, non-tender, non-distended, BS + x 4.  Extremities: Diminished pulses bilaterally. Right leg is wrapped, 1+ edema  Accessory Clinical Findings  CBC  Recent Labs  01/14/13 0405 01/15/13 0745  WBC 9.7 8.6  HGB 10.9* 11.4*  HCT 34.1*  35.6*  MCV 96.3 95.2  PLT 252 250   Basic Metabolic Panel  Recent Labs  01/14/13 0405 01/15/13 0745  NA 143 145  K 4.4 4.2  CL 105 108  CO2 30 29  GLUCOSE 84 84  BUN 35* 35*  CREATININE 1.41* 1.48*  CALCIUM 9.7 10.1   Liver Function Tests No results found for this basename: AST, ALT, ALKPHOS, BILITOT, PROT, ALBUMIN,  in the last 72 hours No results found for this basename: LIPASE, AMYLASE,  in the last 72 hours Cardiac Enzymes No results found for this basename: CKTOTAL, CKMB, CKMBINDEX, TROPONINI,  in the last 72 hours BNP No components found with this basename: POCBNP,  D-Dimer No results found for this basename: DDIMER,  in the last 72 hours Hemoglobin A1C No results found for this basename: HGBA1C,  in the last 72 hours Fasting Lipid Panel No results found for this basename: CHOL, HDL, LDLCALC, TRIG, CHOLHDL, LDLDIRECT,  in the last 72 hours Thyroid Function Tests No results found for this basename: TSH, T4TOTAL, FREET3, T3FREE, THYROIDAB,  in the last 72 hours  TELE  Chronic A. fib  ECG    Radiology/Studies  Dg Chest [facility]  1 View  01/07/2013   *RADIOLOGY REPORT*  Clinical Data: CHF, weakness, CPR  PORTABLE CHEST - 1 VIEW  Comparison: 01/05/2013  Findings: Increased interstitial markings without frank interstitial edema.  Possible small left pleural effusion. Eventration of the right hemidiaphragm.  Cardiomegaly.  Defibrillator pads overlying the left hemithorax.  IMPRESSION: Increased interstitial markings without frank interstitial edema.  Possible small left pleural effusion.   Original Report Authenticated By: Charline Bills, M.D.   Dg Chest Portable 1 View  01/05/2013   *RADIOLOGY REPORT*  Clinical Data: Shortness of breath and chest pain.  PORTABLE CHEST - 1 VIEW  Comparison: PA and lateral chest 07/23/2011 and 08/03/2012.  Findings: There is cardiomegaly and mild interstitial edema.  Trace left pleural effusion is noted.  Chronic, mild elevation of the  right hemidiaphragm relative to the left is noted.  No consolidative process or pneumothorax identified.  IMPRESSION: Cardiomegaly and mild interstitial edema with a small left effusion.   Original Report Authenticated By: Holley Dexter, M.D.    ASSESSMENT AND PLAN  Principal Problem:   Non-ST elevation myocardial infarction (NSTEMI), initial care episode Active Problems:   Chronic atrial fibrillation   Chronic anticoagulation   Nonischemic cardiomyopathy   Cellulitis of right leg   Acute on chronic systolic congestive heart failure   Sustained VT (ventricular tachycardia)    Very nice summary by Dr. Graciela Husbands yesterday. If her IN R. is 2 or greater tomorrow she should be able to go home. I encourage pharmacy to push higher dose. She needs home health and has care management through [facility] with [name]. She is very high risk for repeat hospitalization for readmission. She is also at high risk of fall but seems to be doing better with this. She has good family support. Antibiotic therapy should be completed by tomorrow for her UTI. Would not send her home on antibiotics. She's close followup in the [place] office with me. I am there on the 24th. Please make appointment.   Signed, Valera Castle MD

## 2013-01-15 NOTE — Progress Notes (Addendum)
CARDIAC REHAB PHASE I   PRE:  Rate/Rhythm: 46 Afib  BP:  Supine:   Sitting: 128/63  Standing:    SaO2: 98 RA  MODE:  Ambulation: 204 ft   POST:  Rate/Rhythm: 53  BP:  Supine:   Sitting:   Standing:    SaO2: 98 RA 1130-1155 Assisted X 1 and used walker to ambulate. Gait steady with walker pt leans forward with walking. VS stable. Pt back to recliner after walk with call light in reach. Would recommend RW for home use.  Melina Copa RN 01/15/2013 12:01 PM

## 2013-01-15 NOTE — Progress Notes (Signed)
Physical Therapy Treatment Patient Details Name: DEVORY MCKINZIE MRN: [id number] DOB: 1933-06-15 Today's Date: 01/15/2013 Time: 4098-1191 PT Time Calculation (min): 23 min  PT Assessment / Plan / Recommendation  PT Comments   Pt's sister now staying with patient in patient's home instead of going to sisters home. Pt able to complete 3 steps to enter home safely with min guard. Discussed having sister transport walker up to top of stairs. Pt with increased activity tolerance. Pt safe to d/c home with 24/7 assist, HHPT and use of RW once medically stable.   Follow Up Recommendations  Home health PT;Supervision - Intermittent     Does the patient have the potential to tolerate intense rehabilitation     Barriers to Discharge        Equipment Recommendations  None recommended by PT    Recommendations for Other Services    Frequency Min 3X/week   Progress towards PT Goals Progress towards PT goals: Progressing toward goals  Plan Current plan remains appropriate    Precautions / Restrictions Precautions Precautions: Fall Restrictions Weight Bearing Restrictions: No   Pertinent Vitals/Pain Denies pain    Mobility  Bed Mobility Bed Mobility: Not assessed Transfers Transfers: Sit to Stand;Stand to Sit Sit to Stand: 5: Supervision;With upper extremity assist;From bed Stand to Sit: 5: Supervision;With upper extremity assist;With armrests;To chair/3-in-1 Details for Transfer Assistance: VC for hand placement Ambulation/Gait Ambulation/Gait Assistance: 5: Supervision Ambulation Distance (Feet): 200 Feet Assistive device: Rolling walker Ambulation/Gait Assistance Details: con't to requries v/c's to maintain upright/erect posture. Pt reports of old back injury and she is unable to maintain back extension for long periods of time Gait Pattern: Step-through pattern;Trunk flexed Gait velocity: decr General Gait Details: no episodes of LOB, pt reports she needs RW at this time and  doesn't feel comfortable amb without Stairs: Yes Stair Management Technique: One rail Right;Forwards;Step to pattern Number of Stairs: 3 (to mimic home set up) Wheelchair Mobility Wheelchair Mobility: No    Exercises     PT Diagnosis:    PT Problem List:   PT Treatment Interventions:     PT Goals (current goals can now be found in the care plan section)    Visit Information  Last PT Received On: 01/15/13 Assistance Needed: +1 History of Present Illness: Pt adm with NSTEMI.    Subjective Data  Subjective: Pt received transferring self back to EOB from [facility]   Cognition  Cognition Arousal/Alertness: Awake/alert Behavior During Therapy: WFL for tasks assessed/performed Overall Cognitive Status: Within Functional Limits for tasks assessed    Balance     End of Session PT - End of Session Equipment Utilized During Treatment: Gait belt Activity Tolerance: Patient tolerated treatment well Patient left: in chair;with call bell/phone within reach Nurse Communication: Mobility status   GP     Marcene Brawn 01/15/2013, 9:14 AM  Lewis Shock, PT, DPT Pager #: [phone] Office #: [phone]

## 2013-01-16 ENCOUNTER — Telehealth: Payer: Self-pay | Admitting: *Deleted

## 2013-01-16 ENCOUNTER — Ambulatory Visit (HOSPITAL_COMMUNITY): Payer: Medicare Other

## 2013-01-16 ENCOUNTER — Encounter (HOSPITAL_COMMUNITY): Payer: Self-pay | Admitting: Physician Assistant

## 2013-01-16 DIAGNOSIS — I495 Sick sinus syndrome: Secondary | ICD-10-CM

## 2013-01-16 DIAGNOSIS — N183 Chronic kidney disease, stage 3 unspecified: Secondary | ICD-10-CM

## 2013-01-16 DIAGNOSIS — I2542 Coronary artery dissection: Secondary | ICD-10-CM

## 2013-01-16 LAB — BASIC METABOLIC PANEL
CO2: 24 mEq/L (ref 19–32)
Chloride: 105 mEq/L (ref 96–112)
Glucose, Bld: 86 mg/dL (ref 70–99)
Potassium: 4.6 mEq/L (ref 3.5–5.1)
Sodium: 143 mEq/L (ref 135–145)

## 2013-01-16 LAB — CBC
HCT: 35.5 % — ABNORMAL LOW (ref 36.0–46.0)
Hemoglobin: 11.2 g/dL — ABNORMAL LOW (ref 12.0–15.0)
RDW: 15.4 % (ref 11.5–15.5)
WBC: 8.8 10*3/uL (ref 4.0–10.5)

## 2013-01-16 LAB — PROTIME-INR: INR: 1.91 — ABNORMAL HIGH (ref 0.00–1.49)

## 2013-01-16 MED ORDER — NITROGLYCERIN 0.4 MG SL SUBL: 0.4000 mg | SUBLINGUAL_TABLET | SUBLINGUAL | Status: AC | PRN

## 2013-01-16 MED ORDER — CARVEDILOL 3.125 MG PO TABS: 3.1250 mg | ORAL_TABLET | Freq: Two times a day (BID) | ORAL | Status: DC

## 2013-01-16 MED ORDER — FUROSEMIDE 40 MG PO TABS: 40.0000 mg | ORAL_TABLET | Freq: Every day | ORAL | Status: DC

## 2013-01-16 MED ORDER — WARFARIN SODIUM 5 MG PO TABS: ORAL_TABLET | ORAL | Status: DC

## 2013-01-16 MED ORDER — WARFARIN SODIUM 5 MG PO TABS: 5.0000 mg | ORAL_TABLET | Freq: Every day | ORAL | Status: DC

## 2013-01-16 MED ORDER — POTASSIUM CHLORIDE ER 10 MEQ PO TBCR: 10.0000 meq | EXTENDED_RELEASE_TABLET | Freq: Every morning | ORAL | Status: DC

## 2013-01-16 MED ORDER — ATORVASTATIN CALCIUM 40 MG PO TABS: 40.0000 mg | ORAL_TABLET | Freq: Every day | ORAL | Status: DC

## 2013-01-16 MED ORDER — WARFARIN SODIUM 7.5 MG PO TABS
7.5000 mg | ORAL_TABLET | Freq: Once | ORAL | Status: AC
  Administered 2013-01-16: 7.5 mg via ORAL
  Filled 2013-01-16 (×2): qty 1

## 2013-01-16 MED ORDER — LOSARTAN POTASSIUM 50 MG PO TABS: 50.0000 mg | ORAL_TABLET | Freq: Every day | ORAL | Status: AC

## 2013-01-16 NOTE — Progress Notes (Addendum)
CARDIAC REHAB PHASE I   PRE:  Rate/Rhythm: 46 Afib  BP:  Supine: 133/48  Sitting:   Standing:    SaO2: 97 RA  MODE:  Ambulation: 284 ft   POST:  Rate/Rhythm: 49  BP:  Supine:   Sitting: 145/51  Standing:    SaO2: 99 RA 1035-1107 Assisted X 1 and used walker to ambulate. Gait steady with walker, pt leans forward with walking. She states that  It is hard for her to straighten her back. VS stable Pt to recliner after walk with call light in reach.  Melina Copa RN 01/16/2013 11:02 AM

## 2013-01-16 NOTE — Progress Notes (Signed)
01/16/13 1130 In to speak with pt. about home health services.  Pt. is interested in having home health and after reviewing home health agenices, pt. chose [facility].  TC to Lupita Leash, with [facility], to give referral for [facility] RN, PT/OT.  Pt. to dc home today.  In addition, pt. would like a rolling walker.  TC to [place], with [facility] to give referral for DME.  Tera Mater, RN, BSN NCM [phone]

## 2013-01-16 NOTE — Progress Notes (Signed)
All d/c instructions explained and given to pt.  Verbalized understanding.  Pt stated she is waiting for her ride home.  Amanda Pea, Charity fundraiser.

## 2013-01-16 NOTE — Progress Notes (Signed)
Pt d/c to home.  Picked up by family member.  Left floor via w/c.  Amanda Pea, Charity fundraiser.

## 2013-01-16 NOTE — Progress Notes (Signed)
Physical Therapy Treatment Patient Details Name: Marissa [name] MRN: [id number] DOB: 12-07-1932 Today's Date: 01/16/2013 Time: 1308-6578 PT Time Calculation (min): 25 min  PT Assessment / Plan / Recommendation  PT Comments   Pt continues to move well and motivated to get home today.  Pt continues to need min (A) to manage steps and educated pt on having caregiver to assist with steps.  Continue to recommend HHPT at d/c.   Follow Up Recommendations  Home health PT;Supervision - Intermittent     Equipment Recommendations  None recommended by PT    Frequency Min 3X/week   Progress towards PT Goals Progress towards PT goals: Progressing toward goals  Plan Current plan remains appropriate    Precautions / Restrictions Precautions Precautions: Fall Restrictions Weight Bearing Restrictions: No   Pertinent Vitals/Pain No c/o pain    Mobility  Bed Mobility Bed Mobility: Not assessed Transfers Transfers: Sit to Stand;Stand to Sit Sit to Stand: 5: Supervision;From chair/3-in-1 Stand to Sit: 5: Supervision;With armrests;To chair/3-in-1 Details for Transfer Assistance: VC for hand placement Ambulation/Gait Ambulation/Gait Assistance: 5: Supervision Ambulation Distance (Feet): 200 Feet Assistive device: Rolling walker Ambulation/Gait Assistance Details: Cues for RW placement Gait Pattern: Step-through pattern;Trunk flexed Gait velocity: decr Stairs: Yes Stairs Assistance: 4: Min assist Stairs Assistance Details (indicate cue type and reason): (A) to maintain balance and to descend steps safely Stair Management Technique: One rail Right;Forwards;Step to pattern Number of Stairs: 3    Exercises     PT Diagnosis:    PT Problem List:   PT Treatment Interventions:     PT Goals (current goals can now be found in the care plan section) Acute Rehab PT Goals Patient Stated Goal: To get stronger PT Goal Formulation: With patient Time For Goal Achievement: 01/17/13 Potential to  Achieve Goals: Good  Visit Information  Last PT Received On: 01/16/13 Assistance Needed: +1 History of Present Illness: Pt adm with NSTEMI.    Subjective Data  Subjective: I'm suppose to go home today. Patient Stated Goal: To get stronger   Cognition  Cognition Arousal/Alertness: Awake/alert Behavior During Therapy: WFL for tasks assessed/performed Overall Cognitive Status: Within Functional Limits for tasks assessed    Balance     End of Session PT - End of Session Equipment Utilized During Treatment: Gait belt Activity Tolerance: Patient tolerated treatment well Patient left: in chair;with call bell/phone within reach Nurse Communication: Mobility status   GP     Haileigh Pitz 01/16/2013, 12:55 PM  Jake Shark, PT DPT [phone]

## 2013-01-16 NOTE — Progress Notes (Signed)
Patient ID: NIMRIT KEHRES, female   DOB: 1932-08-23, 79 y.o.   MRN: [id number]   Patient Name: Marissa [name] Date of Encounter: 01/16/2013    SUBJECTIVE  No chest pain or shortness of breath. Walking with assistance with a walker and stable. Ready for discharge.  CURRENT MEDS . atorvastatin  10 mg Oral Daily  . carvedilol  3.125 mg Oral BID WC  . cholecalciferol  2,000 Units Oral Daily  . digoxin  0.0625 mg Oral Daily  . folic acid  1 mg Oral Daily  . furosemide  40 mg Oral Daily  . gabapentin  300 mg Oral QHS  . hydroxychloroquine  200 mg Oral Daily  . potassium chloride  10 mEq Oral Daily  . predniSONE  5 mg Oral Daily  . Warfarin - Pharmacist Dosing Inpatient   Does not apply q1800    OBJECTIVE  Filed Vitals:   01/15/13 1015 01/15/13 1500 01/15/13 2009 01/16/13 0518  BP: 111/45 133/39 134/53 123/47  Pulse: 50 59 72 44  Temp:  97.7 F (36.5 C) 98.7 F (37.1 C) 97.7 F (36.5 C)  TempSrc:  Oral Oral Oral  Resp:  18 16 15   Height:      Weight:    171 lb 3.2 oz (77.656 kg)  SpO2:  97% 97% 96%    Intake/Output Summary (Last 24 hours) at 01/16/13 0754 Last data filed at 01/16/13 0100  Gross per 24 hour  Intake 921.08 ml  Output   1050 ml  Net -128.92 ml   Filed Weights   01/14/13 0610 01/15/13 0447 01/16/13 0518  Weight: 171 lb 3.2 oz (77.656 kg) 169 lb 12.8 oz (77.021 kg) 171 lb 3.2 oz (77.656 kg)    PHYSICAL EXAM  General: Pleasant, NAD. Neuro: Alert and oriented X 3. Moves all extremities spontaneously. Psych: Normal affect. HEENT:  Normal  Neck: Supple without bruits or JVD. Lungs:  Resp regular and unlabored, CTA. Heart: irregular rate and rhythm no s3, s4, or murmurs. Abdomen: Soft, non-tender, non-distended, BS + x 4.  Extremities: No clubbing, cyanosis , 1+ edema, right leg wrapped. DP/PT/Radials 2+ and equal bilaterally.  Accessory Clinical Findings  CBC  Recent Labs  01/15/13 0745 01/16/13 0535  WBC 8.6 8.8  HGB 11.4* 11.2*  HCT 35.6*  35.5*  MCV 95.2 97.0  PLT 250 265   Basic Metabolic Panel  Recent Labs  01/14/13 0405 01/15/13 0745  NA 143 145  K 4.4 4.2  CL 105 108  CO2 30 29  GLUCOSE 84 84  BUN 35* 35*  CREATININE 1.41* 1.48*  CALCIUM 9.7 10.1   Liver Function Tests No results found for this basename: AST, ALT, ALKPHOS, BILITOT, PROT, ALBUMIN,  in the last 72 hours No results found for this basename: LIPASE, AMYLASE,  in the last 72 hours Cardiac Enzymes No results found for this basename: CKTOTAL, CKMB, CKMBINDEX, TROPONINI,  in the last 72 hours BNP No components found with this basename: POCBNP,  D-Dimer No results found for this basename: DDIMER,  in the last 72 hours Hemoglobin A1C No results found for this basename: HGBA1C,  in the last 72 hours Fasting Lipid Panel No results found for this basename: CHOL, HDL, LDLCALC, TRIG, CHOLHDL, LDLDIRECT,  in the last 72 hours Thyroid Function Tests No results found for this basename: TSH, T4TOTAL, FREET3, T3FREE, THYROIDAB,  in the last 72 hours  TELE  Chronic AFib  ECG    Radiology/Studies  Dg Chest [facility] 1 [address]  01/07/2013   *RADIOLOGY REPORT*  Clinical Data: CHF, weakness, CPR  PORTABLE CHEST - 1 VIEW  Comparison: 01/05/2013  Findings: Increased interstitial markings without frank interstitial edema.  Possible small left pleural effusion. Eventration of the right hemidiaphragm.  Cardiomegaly.  Defibrillator pads overlying the left hemithorax.  IMPRESSION: Increased interstitial markings without frank interstitial edema.  Possible small left pleural effusion.   Original Report Authenticated By: Charline Bills, M.D.   Dg Chest Portable 1 View  01/05/2013   *RADIOLOGY REPORT*  Clinical Data: Shortness of breath and chest pain.  PORTABLE CHEST - 1 VIEW  Comparison: PA and lateral chest 07/23/2011 and 08/03/2012.  Findings: There is cardiomegaly and mild interstitial edema.  Trace left pleural effusion is noted.  Chronic, mild elevation of the right  hemidiaphragm relative to the left is noted.  No consolidative process or pneumothorax identified.  IMPRESSION: Cardiomegaly and mild interstitial edema with a small left effusion.   Original Report Authenticated By: Holley Dexter, M.D.    ASSESSMENT AND PLAN  Principal Problem:   Non-ST elevation myocardial infarction (NSTEMI), initial care episode Active Problems:   Chronic atrial fibrillation   Chronic anticoagulation   Nonischemic cardiomyopathy   Cellulitis of right leg   Acute on chronic systolic congestive heart failure   Sustained VT (ventricular tachycardia)   Ready for discharge. Antibiotic day 7 to complete today. Discharge on current meds. Needs to reestablish with the wound Center in [place]. We'll notify [facility] CM the patient is coming home. At long talk with patient and daughter about being extremely careful getting up and down and not falling. Her INR is 1.9. Continue current anticoagulation dose per pharmacy. She has her Coumadin checked in [place] office. See my note from yesterday to plan discharge.  Signed, Valera Castle MD

## 2013-01-16 NOTE — Progress Notes (Signed)
Informed by Victorino Dike at monitor desk this am pt noted to be 1st- 3rd degree AVB - , instead of A. Fib which pt had been.   Pt is asymptomatic. EKG showed Junctional rhythm w/ heart rate 45.  Paged Annabelle Harman initially and instructed to call Dr. Daleen Squibb.  Dr. Daleen Squibb notified and instructed nurse to d/c digoxin.  Will continue to monitor.  Amanda Pea, Charity fundraiser.

## 2013-01-16 NOTE — Progress Notes (Signed)
ANTICOAGULATION CONSULT NOTE - Follow Up Consult  Pharmacy Consult for Heparin / Coumadin Indication: atrial fibrillation  Labs:  Recent Labs  01/14/13 0405 01/15/13 0745 01/15/13 2334 01/16/13 0535  HGB 10.9* 11.4*  --  11.2*  HCT 34.1* 35.6*  --  35.5*  PLT 252 250  --  265  LABPROT 19.7* 20.2*  --  21.3*  INR 1.72* 1.78*  --  1.91*  HEPARINUNFRC 0.58 1.10* 0.52 0.43  CREATININE 1.41* 1.48*  --  1.67*    Assessment: 79yo female now therapeutic on heparin.  INR continues to slowly increase.  NO bleeding noted  Goal of Therapy:  Heparin level 0.3-0.7 units/ml INR goal 2-3   Plan:  1) Continue same heparin dose 2) Coumadin 7.5 mg po x 1 dose today, then discharge home on 5mg  daily and check INR on Monday 4) Follow up AM labs if not discharged  Thank you. Piedad Climes, PharmD [phone]  01/16/2013,10:01 AM

## 2013-01-16 NOTE — Progress Notes (Signed)
ANTICOAGULATION CONSULT NOTE - Follow Up Consult  Pharmacy Consult for heparin Indication: atrial fibrillation  Labs:  Recent Labs  01/13/13 0355  01/14/13 0405 01/15/13 0745 01/15/13 2334  HGB 10.7*  --  10.9* 11.4*  --   HCT 32.6*  --  34.1* 35.6*  --   PLT 224  --  252 250  --   LABPROT 19.0*  --  19.7* 20.2*  --   INR 1.64*  --  1.72* 1.78*  --   HEPARINUNFRC 0.90*  < > 0.58 1.10* 0.52  CREATININE 1.37*  --  1.41* 1.48*  --   < > = values in this interval not displayed.   Assessment/Plan:  79yo female now therapeutic on heparin after rate increase.  Will continue gtt at current rate and confirm stable with am labs.  Vernard Gambles, PharmD, BCPS  01/16/2013,12:14 AM

## 2013-01-16 NOTE — Progress Notes (Signed)
Spoke with patient at bedside this am.  She is in good spirits and motivated to return home.  Dishcarge is anticipated for today.  Requested discharging PA to request [facility] visits daily for 3-5 days because patient will go home with her lasix and losartan on hold due to elevated creatinine.  Plan is to monitor her weights and symptoms closely as cardiology works carefully to establish her dry weight.  Follow up appointments have been verified.  Made inpatient care manager Ivonne Andrew RN aware of collaboration.  Of note, [facility] Care Management services does not replace or interfere with any services that are arranged by inpatient case management or social work.  For additional questions or referrals please contact Anibal Henderson BSN RN [facility] Liaison at [phone].

## 2013-01-16 NOTE — Discharge Summary (Signed)
Discharge Summary   Patient ID: Marissa [name],  MRN: [id number], DOB/AGE: [id number] y.o.  Admit date: 01/05/2013 Discharge date: 01/16/2013  Primary Physician: Carylon Perches, MD Primary Cardiologist: T. Greggory Safranek, MD  Discharge Diagnoses Principal Problem:   Non-ST elevation myocardial infarction (NSTEMI), initial care episode  - Trop-I trend <0.30->19.14->20.0  - Subsequent sustained, hemodynamically unstable monomorphic VT s/p successful CPR and defib x 1  - OM3 95% lesion felt to represent spontaneous coronary dissection  - Medically managed Active Problems:   Nonischemic cardiomyopathy   Acute on chronic systolic congestive heart failure   - EF 40-45% on echo, mild LVH, mlid MR, mod biatrial enlargment, mild RV dilatation, mod TR, PASP 54 mmHg  - Weight change 173 lbs to 169-171 lbs on discharge   Sustained VT (ventricular tachycardia)  - Ischemic s/p successful CPR and defib x 1   Coronary artery dissection  - OM3, medical management   Chronic atrial fibrillation   Tachy-brady syndrome  - A-fib with RVR on admission  - Junctional, SND and sinus bradycardia during admission  - Ischemic-mediated, digoxin discontinued   Acute on chronic kidney disease, stage 3  - Cr 1.18 post-cath, gradual uptrend to 1.67  - Repeat BMET, Mg on follow-up   Chronic anticoagulation  - Coumadin held and restarted pre- and post-cath  - INR 1.9 today  - Coumadin regimen as below, to follow-up with Coumadin clinic as below   Hypertension   Hyperlipidemia   Rheumatoid arthritis  - On chronic prednison   Cellulitis of right leg  - To follow-up with wound care clinic in [place], managed there previously   Allergies Allergies  Allergen Reactions  . Codeine Other (See Comments)    Makes patient sleepy.  Marland Kitchen Penicillins Rash    Diagnostic Studies/Procedures  PORTABLE CHEST X-RAY - 01/05/13  IMPRESSION:  Cardiomegaly and mild interstitial edema with a small left  effusion.  PORTABLE  CHEST X-RAY - 01/07/13  IMPRESSION:  Increased interstitial markings without frank interstitial edema.  Possible small left pleural effusion.  TRANSTHORACIC ECHOCARDIOGRAM - 01/06/13  - Left ventricle: The cavity size was mildly dilated. Orphia Mctigue thickness was increased in a pattern of mild LVH. Systolic function was mildly to moderately reduced. The estimated ejection fraction was in the range of 40% to 45%. Diffuse hypokinesis. There is hypokinesis of the anteroseptal myocardium. - Mitral valve: Calcified annulus. Mild regurgitation. - Left atrium: The atrium was moderately dilated. - Right ventricle: The cavity size was mildly dilated. - Right atrium: The atrium was moderately to severely dilated. - Tricuspid valve: Moderate regurgitation. - Pulmonary arteries: PA peak pressure: 54mm Hg (S).  CARDIAC CATHETERIZATION - 01/08/13  Hemodynamics:  AO: 132/73 mmHg  LV: 138/7 mmHg  LVEDP: 13 mmHg  Coronary angiography:  Coronary dominance: Right  Left Main: Normal  Left Anterior Descending (LAD): Large in size with minor irregularities. The vessel wraps around the apex.  1st diagonal (D1): Normal in size with minor irregularities.  2nd diagonal (D2): Normal in size with no significant disease.  3rd diagonal (D3): Very small in size.  Circumflex (LCx): Normal in size and nondominant. The vessel has no significant disease.  1st obtuse marginal: Small in size with no significant disease.  2nd obtuse marginal: Small in size with no significant disease.  3rd obtuse marginal: Normal in size with diffuse 95% disease proximally in a very tortuous segment. This is likely the culprit for non-ST elevation myocardial infarction.  Right Coronary Artery: Normal in size and dominant. There are  minor luminal irregularities proximally. Otherwise no significant disease. Left ventriculography: Was not performed due to recent renal failure. EF by echo was 40-45%.  Final Conclusions:  1. Significant 1 vessel  coronary artery disease with diffuse 95% stenosis in proximal OM 3. The affected area is significantly tortuous and the distal vessel distribution is relatively small in size. Thus, risks of PCI outweigh the benefits.  2. Normal LVEDP.   History of Present Illness Marissa [name] is a 79 y.o. female who was admitted to [facility] 01/05/13 with the above problem list.   She has a complex cardiac history including nonischemic cardiomyopathy (most recent EF 15-20%), permanent atrial fibrillation (on chronic Coumadin therapy), chronic systolic CHF, long-standing rheumatoid arthritis since age 37 with known vasculitis sequelae. She had been admitted with mild CHF in the setting of cellulitis earlier this year and had been treated at the wound clinic as an outpatient in [place]. She presented to [facility] on 01/05/13 complaining of bilateral arm pain and midsternal chest pain occurring the day of admission with associated shortness of breath. On arrival to the emergency department, she was noted to be in atrial fibrillation with RVR and significant ST depressions (2 mm) in V4-V6. Initial troponin I was within normal limits however pro BNP was markedly elevated at 3772. Basic metabolic panel did indicate an acute on chronic kidney disease picture (creatinine 1.7, baseline creatinine 1.1-1.2). As above, chest x-ray revealed evidence of cardiomegaly with mild interstitial edema and small left effusion consistent with CHF. The patient's chest and arm discomfort did improve with nitroglycerin. The decision was made to admit the patient for further evaluation and management.  Hospital Course   Rate control was achieved on the patient's home beta blocker therapy.. A subsequent troponin returned markedly elevated at 19.14. Coumadin was held. She was started on heparin once INR reached subtherapeutic levels. She was started on a nitroglycerin drip, and pain improved from a 9/10 to 5/10. She was diuresed with  Lasix IV. The decision was made to proceed with cardiac catheterization once her renal function improved and INR reached subtherapeutic levels. Later that afternoon, she did develop sustained monomorphic VT which was hemodynamically unstable status post CPR and defibrillation x1 with successful return of spontaneous circulation. She was started on an amiodarone infusion. She did have subsequent episodes of nonsustained VT. Potassium that morning was within normal limits. Magnesium returned within normal limits at 2.4. This was suspected to be ischemic mediated. She did have episodes of sinus bradycardia, junctional bradycardia and sinus pauses greater than 3 seconds. Beta blocker was transiently held. 2-D echocardiogram as above indicated an EF of 40-45% with abnormalities outlined above. Over the next several days, her renal function improved (creatinine 1.37) and she diuresed well. INR returned at 1.54 on 01/08/13.  The patient was informed, consented and prepped for cardiac catheterization which was accessed via the right groin. As above, this did indicate a 95% on 3 lesion of her present spontaneous coronary dissection. There was minor insignificant disease elsewhere; EF 40-45%. The decision was made to treat medically, resume Coumadin with heparin bridging. Metoprolol tartrate was started. She was continued on statin therapy. There were no further episodes of ventricular ectopy or sustained/nonsustained VT. She was transitioned to amiodarone by mouth. She remained euvolemic post catheterization.  She did have evidence of a UTI and was placed on Keflex. She completed a seven-day course. In addition, wound care management was consulted for persistent lower extremity cellulitis. Teaching care management kidney with  the patient. Care had been established previously and will be resumed at discharge. She ambulated well with cardiac rehabilitation and was deemed stable for transfer to telemetry.  The extent of her  admission thereafter consisted of Coumadin management to obtain a therapeutic INR prior to discharge. Notably, the patient's renal function progressively worsened post cardiac catheterization. Lasix was transitioned to by mouth formulation. INR reached 1.9 today. The patient was deemed stable for discharge by Dr. Daleen Squibb. She will be discharged on the medication regimen outlined below. Of note, aspirin was not started due to evidence of coronary dissection as the culprit lesion over flow limiting stenosis. The patient also is at increased risk of bleeding given her elderly status. There is a concern about possible risk of falls and confusion at home, however after discussion with the family it was felt appropriate to continue Coumadin for thromboembolic prevention with assistance from home health services. Lasix and valsartan therapy will be held and restarted on 01/18/13. In addition, she will have a basic metabolic panel and magnesium drawn on followup within one week's time. She will followup in the [place] office within 7 days as part of transitional care management. She will followup in the Coumadin clinic in [place] on 01/20/13. She will have home health services arranged prior to discharge to include physical therapy, occupational there apy and nursing. She will also followup with wound care as an outpatient at [facility]. She's been advised to followup with her PCP in one week for posthospital followup. This information, including post cath instructions, activity restrictions and supplemental CHF education, has been clearly outlined in the discharge AVS.   Discharge Vitals:  Blood pressure 123/47, pulse 44, temperature 97.7 F (36.5 C), temperature source Oral, resp. rate 15, height 5\' 5"  (1.651 m), weight 77.656 kg (171 lb 3.2 oz), SpO2 96.00%.   Weight change: 0.635 kg (1 lb 6.4 oz)  Labs: Recent Labs     01/15/13  0745  01/16/13  0535  WBC  8.6  8.8  HGB  11.4*  11.2*  HCT  35.6*  35.5*    MCV  95.2  97.0  PLT  250  265    Recent Labs Lab 01/14/13 0405 01/15/13 0745 01/16/13 0535  NA 143 145 143  K 4.4 4.2 4.6  CL 105 108 105  CO2 30 29 24   BUN 35* 35* 42*  CREATININE 1.41* 1.48* 1.67*  CALCIUM 9.7 10.1 10.0  GLUCOSE 84 84 86   Disposition:  Discharge Orders   Future Appointments Provider Department Dept Phone   01/20/2013 11:10 AM [facility] [place] [phone]   01/23/2013 3:20 PM Jodelle Gross, NP [facility] [place] [phone]   Future Orders Complete By Expires     Basic Metabolic Panel (BMET)  01/23/2013 01/16/2014    Questions:      Has the patient fasted?:      Magnesium  01/23/2013 01/16/2014    Diet - low sodium heart healthy  As directed     Increase activity slowly  As directed           Follow-up Information   Follow up with Carylon Perches, MD. Schedule an appointment as soon as possible for a visit in 1 week. (For post-hospital follow-up.)    Contact information:   [address] [place] [address] [phone]       Please follow up. (Please make an appointment to follow-up with the wound care center in [place], [place]. )  Follow up with Joni Reining, NP On 01/23/2013. (At 3:20 PM for post-hospital cardiology follow-up and labwork. )    Contact information:   [address] [place] [address] [phone]       Follow up with [facility] On 01/20/2013. (At 11:10 AM for Coumadin follow-up. )    Contact information:   [address] [place] [address]       Discharge Medications:    Medication List    STOP taking these medications       digoxin 0.125 MG tablet  Commonly known as:  LANOXIN     metoprolol succinate 50 MG 24 hr tablet  Commonly known as:  TOPROL-XL      TAKE these medications       atorvastatin 40 MG tablet  Commonly known as:  LIPITOR  Take 1 tablet (40 mg total) by mouth at bedtime.      carvedilol 3.125 MG tablet  Commonly known as:  COREG  Take 1 tablet (3.125 mg total) by mouth 2 (two) times daily with a meal.     folic acid 1 MG tablet  Commonly known as:  FOLVITE  Take 1 mg by mouth daily.     FOSAMAX 70 MG tablet  Generic drug:  alendronate  Take 70 mg by mouth every 7 (seven) days. Take with a full glass of water on an empty stomach. Takes on Saturday     furosemide 40 MG tablet  Commonly known as:  LASIX  Take 1 tablet (40 mg total) by mouth daily.  Start taking on:  01/18/2013     gabapentin 300 MG capsule  Commonly known as:  NEURONTIN  Take 300 mg by mouth at bedtime.     GENTEAL OP  Place 1 drop into both eyes daily as needed. Tired Eyes     HYDROcodone-acetaminophen 5-325 MG per tablet  Commonly known as:  NORCO/VICODIN  Take 1 tablet by mouth every morning.     hydroxychloroquine 200 MG tablet  Commonly known as:  PLAQUENIL  Take 1 tablet by mouth Twice daily.     losartan 50 MG tablet  Commonly known as:  COZAAR  Take 1 tablet (50 mg total) by mouth daily.     nitroGLYCERIN 0.4 MG SL tablet  Commonly known as:  NITROSTAT  Place 1 tablet (0.4 mg total) under the tongue every 5 (five) minutes as needed for chest pain.     potassium chloride 10 MEQ tablet  Commonly known as:  K-DUR  Take 1 tablet (10 mEq total) by mouth every morning.  Start taking on:  01/18/2013     predniSONE 5 MG tablet  Commonly known as:  DELTASONE  Take 5 mg by mouth daily.     Vitamin D 2000 UNITS Caps  Take 2,000 Units by mouth daily.     warfarin 5 MG tablet  Commonly known as:  COUMADIN  Take 7.5 mg (1.5 tablets) on 01/16/13. Then resume home regimen- 2.5 mg (0.5 tablet) on Mondays. Take 5 mg (1 tablet) on all other days.       Outstanding Labs/Studies: BMET, Mg 01/23/13  Duration of Discharge Encounter: Greater than 30 minutes including physician time.  Signed, R. Hurman Horn, PA-C 01/16/2013, 10:47 AM    Jesse Sans. Daleen Squibb, MD, [facility]  [facility] Pager:  [phone]

## 2013-01-16 NOTE — Telephone Encounter (Signed)
7 DAY TCM

## 2013-01-17 NOTE — Telephone Encounter (Signed)
.  left message to have patient return my call.  

## 2013-01-20 ENCOUNTER — Ambulatory Visit (INDEPENDENT_AMBULATORY_CARE_PROVIDER_SITE_OTHER): Payer: Medicare Other | Admitting: *Deleted

## 2013-01-20 ENCOUNTER — Ambulatory Visit (HOSPITAL_COMMUNITY): Payer: Medicare Other | Admitting: *Deleted

## 2013-01-20 ENCOUNTER — Ambulatory Visit (HOSPITAL_COMMUNITY)
Admission: RE | Admit: 2013-01-20 | Discharge: 2013-01-20 | Disposition: A | Discharge status: Home / Self Care | Payer: Medicare Other | Source: Ambulatory Visit | Attending: Internal Medicine | Admitting: Internal Medicine

## 2013-01-20 DIAGNOSIS — I4891 Unspecified atrial fibrillation: Secondary | ICD-10-CM

## 2013-01-20 DIAGNOSIS — S81009A Unspecified open wound, unspecified knee, initial encounter: Secondary | ICD-10-CM | POA: Insufficient documentation

## 2013-01-20 DIAGNOSIS — IMO0001 Reserved for inherently not codable concepts without codable children: Secondary | ICD-10-CM | POA: Insufficient documentation

## 2013-01-20 DIAGNOSIS — Z7901 Long term (current) use of anticoagulants: Secondary | ICD-10-CM

## 2013-01-20 LAB — POCT INR: INR: 3.1

## 2013-01-20 NOTE — Progress Notes (Signed)
Physical Therapy - Wound Therapy Re-evaluation  Re-evaluation   Patient Details  Name: Marissa [name] MRN: [id number] Date of Birth: Jun 20, 1933  Today's Date: 01/20/2013 Time: 1200-1225 Time Calculation (min): 25 min Charges: Selective debridement (= or < 20 cm)   Visit#: 19 of 27  Re-eval: 02/17/13  Subjective Subjective Assessment Subjective: Pt has been in [facility] secondary to cardiac issues.  Pain Assessment Pain Assessment Pain Assessment: No/denies pain  Wound Therapy Wound 11/08/12 Other (Comment) Leg Right;Posterior;Lateral (Active)  Site / Wound Assessment Clean;Red;Yellow was 100% black 01/20/2013  2:00 PM  % Wound base Red or Granulating 40% was 0 01/20/2013  2:00 PM  % Wound base Yellow 60% was 0 01/20/2013  2:00 PM  % Wound base Black 0% was 100% 01/20/2013  2:00 PM  Peri-wound Assessment Edema;Erythema (blanchable);Maceration 12/03/2012  8:43 AM  Wound Length (cm) 3.4 cm  Was 2.7 with surrounding area fragile 01/20/2013  1:07 PM  Wound Width (cm) 1.5 cm  Was 1.5 01/20/2013  1:07 PM  Wound Depth (cm) 0.2 cm was unknown 01/20/2013  1:07 PM  Margins Attached edges (approximated) 11/29/2012  9:52 AM  Closure None 11/29/2012  9:52 AM  Drainage Amount Scant 01/09/2013 12:00 PM  Drainage Description No odor;Serosanguineous 01/20/2013  2:00 PM  Non-staged Wound Description Full thickness 01/06/2013  3:00 PM  Treatment Cleansed;Debridement (Selective) 01/20/2013  1:07 PM  Dressing Type Silver dressings 01/20/2013  2:00 PM  Dressing Changed Changed 01/20/2013  1:07 PM  Dressing Status Clean;Dry;Intact 01/20/2013  2:00 PM     Wound 11/08/12 Other (Comment) Leg Left;Medial slightly iinferior to first wound (Active)  Site / Wound Assessment Other (Comment) was not present. 01/07/2013  8:00 AM  % Wound base Red or Granulating 40% 01/20/2013  2:00 PM  % Wound base Yellow 60% 01/20/2013  2:00 PM  % Wound base Black 0% 01/06/2013  3:00 PM  Peri-wound Assessment Edema;Erythema  (non-blanchable) 11/29/2012  9:52 AM  Wound Length (cm) 3.5 cm 01/20/2013  1:07 PM  Wound Width (cm) 1.4 cm 01/20/2013  1:07 PM  Wound Depth (cm) 0.2 cm 01/20/2013  1:07 PM  Margins Attached edges (approximated) 11/29/2012  9:52 AM  Drainage Amount Minimal 01/06/2013  3:00 PM  Drainage Description No odor;Serosanguineous 01/20/2013  2:00 PM  Non-staged Wound Description Full thickness 01/06/2013  3:00 PM  Treatment Cleansed;Debridement (Selective) 01/20/2013  1:07 PM  Dressing Type Silver dressings 01/20/2013  2:00 PM  Dressing Changed Changed 01/20/2013  1:07 PM  Dressing Status Clean;Dry;Intact 01/20/2013  2:00 PM   Selective Debridement Selective Debridement - Location: Rt posterior LE wounds Selective Debridement - Tools Used: Scalpel;Forceps Selective Debridement - Tissue Removed: slough and dry skin at periwound   Physical Therapy Assessment and Plan Wound Therapy - Assess/Plan/Recommendations Wound Therapy - Clinical Statement: Pt was hospitalized for 9 days secondary to cardiac complications. Wounds were not debrided during hospital but dressing changes were completed. Wound initial presents with increased necrotic tissue which was easily removed. Wound had been dressed with silver hydrofiber. Therapist changed this to silver acticoat as dressing was adhered to wound. Vaseline applied to periwound to protect skin integrity.  Hydrotherapy Plan: Debridement;Dressing change;Patient/family education Wound Therapy - Frequency: Other (comment) (2x/week) Wound Therapy - Current Recommendations: PT Wound Plan: Recommend to continue wound care 2x/week x 4 weeks.  Problem List Patient Active Problem List   Diagnosis Date Noted  . Tachy-brady syndrome 01/16/2013  . Acute on chronic kidney disease, stage 3 01/16/2013  . Coronary artery dissection  01/16/2013  . Sustained VT (ventricular tachycardia) 01/07/2013  . Non-ST elevation myocardial infarction (NSTEMI), initial care episode 01/06/2013  .  Acute on chronic systolic congestive heart failure 01/05/2013  . History of endometrial cancer   . Lumbar disc disease   . Hypertension   . Hyperlipidemia   . Rheumatoid arthritis   . Gout   . Cellulitis of right leg   . Nonischemic cardiomyopathy 08/09/2011  . Chronic systolic heart failure 08/09/2011  . Chronic anticoagulation 10/07/2010  . Chronic atrial fibrillation     GP Functional Assessment Tool Used: Clinical observation Functional Limitation: Other PT primary Other PT Primary Current Status (O9629): At least 40 percent but less than 60 percent impaired, limited or restricted Other PT Primary Goal Status (B2841): At least 1 percent but less than 20 percent impaired, limited or restricted  Seth Bake, PTA  01/20/2013, 2:01 PM

## 2013-01-20 NOTE — Telephone Encounter (Signed)
Patient contacted regarding discharge from [facility] 01-05-13  Patient understands to follow up with provider KL NPon 01-23-13 at 3:20PM at [facility] Patient understands discharge instructions? Yes  Patient understands medications and regiment? yes Patient understands to bring all medications to this visit? yes    Pt was currently meeting with home health nurse during this nurse phone call and noted understanding, also noted she did complete her coumadin OV with LR today 01-20-13

## 2013-01-23 ENCOUNTER — Ambulatory Visit (INDEPENDENT_AMBULATORY_CARE_PROVIDER_SITE_OTHER): Payer: Medicare Other | Admitting: Adult Health

## 2013-01-23 ENCOUNTER — Encounter: Payer: Medicare Other | Admitting: Adult Health

## 2013-01-23 ENCOUNTER — Telehealth (HOSPITAL_COMMUNITY): Payer: Self-pay

## 2013-01-23 ENCOUNTER — Telehealth: Payer: Self-pay | Admitting: Cardiology

## 2013-01-23 ENCOUNTER — Ambulatory Visit (HOSPITAL_COMMUNITY): Payer: Medicare Other | Admitting: *Deleted

## 2013-01-23 ENCOUNTER — Ambulatory Visit (HOSPITAL_COMMUNITY): Payer: Medicare Other | Admitting: Physical Therapy

## 2013-01-23 ENCOUNTER — Encounter: Payer: Self-pay | Admitting: Adult Health

## 2013-01-23 VITALS — BP 118/64 | HR 77 | Ht 65.0 in | Wt 173.0 lb

## 2013-01-23 DIAGNOSIS — I2542 Coronary artery dissection: Secondary | ICD-10-CM

## 2013-01-23 DIAGNOSIS — I5022 Chronic systolic (congestive) heart failure: Secondary | ICD-10-CM

## 2013-01-23 DIAGNOSIS — I4891 Unspecified atrial fibrillation: Secondary | ICD-10-CM

## 2013-01-23 NOTE — Progress Notes (Signed)
HPI: Marissa [name] is a medically complicated patient of Dr. Juanito Doom, with a history of nonischemic heart myopathy, chronic systolic heart failure, and chronic atrial fibrillation. The patient was recently admitted to [facility] in the setting of a non-ST elevation myocardial infarction with symptoms of bilateral arm pain and shortness of breath. On arrival initially she was found to be in A. fib with RVR, with troponin trend highest at 20.0. She had a cardiac catheterization completed revealing one-vessel coronary artery disease with diffuse 95% stenosis in the proximal OM 3 PCI was not completed as the area was very tortuous and the distal vessel distribution was very small. She had subsequent sustained, stable monomorphic V. tach status post successful CPR and defibrillation x1 the same afternoon after catheterization. Suspected to be ischemic mediated.    After approximately 10 days in the hospital the patient was released, found to have an EF of 40-45%. Remains on Coumadin with improved renal function with a creatinine of 1.37 (from initial creatinine of 1.7). She continues to be seen in our Coumadin clinic in [place]. She is followed by home health nurses for INR checks. She also is continuing to be treated by the wound clinic for a right lower extremity cellulitis.    On today's visit she is feeling well without complaints of shortness of breath dizziness pain. Her family is very concerned about her functional status and she is trying to do more than she has been advised to do. She is not using a walker but a cane for ambulation. She continues to do a lot on her own without requesting help provided. Her weight has been maintained, her heart rate is doing well, and she is asymptomatic.          Allergies  Allergen Reactions  . Codeine Other (See Comments)    Makes patient sleepy.  Marland Kitchen Penicillins Rash    Current Outpatient Prescriptions  Medication Sig Dispense Refill  . alendronate  (FOSAMAX) 70 MG tablet Take 70 mg by mouth every 7 (seven) days. Take with a full glass of water on an empty stomach. Takes on Saturday      . atorvastatin (LIPITOR) 40 MG tablet Take 1 tablet (40 mg total) by mouth at bedtime.  30 tablet  3  . carvedilol (COREG) 3.125 MG tablet Take 1 tablet (3.125 mg total) by mouth 2 (two) times daily with a meal.  60 tablet  3  . Cholecalciferol (VITAMIN D) 2000 UNITS CAPS Take 2,000 Units by mouth daily.      . folic acid (FOLVITE) 1 MG tablet Take 1 mg by mouth daily.      . furosemide (LASIX) 40 MG tablet Take 1 tablet (40 mg total) by mouth daily.  30 tablet  3  . gabapentin (NEURONTIN) 300 MG capsule Take 300 mg by mouth at bedtime.      Marland Kitchen HYDROcodone-acetaminophen (NORCO/VICODIN) 5-325 MG per tablet Take 1 tablet by mouth every morning.      . hydroxychloroquine (PLAQUENIL) 200 MG tablet Take 1 tablet by mouth Twice daily.      . Hypromellose (GENTEAL OP) Place 1 drop into both eyes daily as needed. Tired Eyes      . losartan (COZAAR) 50 MG tablet Take 1 tablet by mouth daily.      . nitroGLYCERIN (NITROSTAT) 0.4 MG SL tablet Place 1 tablet (0.4 mg total) under the tongue every 5 (five) minutes as needed for chest pain.  25 tablet  12  . potassium  chloride (K-DUR) 10 MEQ tablet Take 1 tablet (10 mEq total) by mouth every morning.  30 tablet  3  . predniSONE (DELTASONE) 5 MG tablet Take 5 mg by mouth daily.      Marland Kitchen warfarin (COUMADIN) 5 MG tablet Take 7.5 mg (1.5 tablets) on 01/16/13. Then resume home regimen- 2.5 mg (0.5 tablet) on Mondays. Take 5 mg (1 tablet) on all other days.  30 tablet  3   No current facility-administered medications for this visit.    Past Medical History  Diagnosis Date  . Permanent atrial fibrillation     A.  Chronic Coumadin  . MVP (mitral valve prolapse)   . Hypertension   . Nonischemic cardiomyopathy 2001    A.  07/23/11 - Echo: EF 35%;  B. 07/24/2011 - Cath: nonobs dzs ef 35%, 2+MR C. Echo 01/06/13: EF 40-45%, mild LVH,  mlid MR, mod biatrial enlargment, mild RV dilatation, mod TR, PASP 54 mmHg  . Systolic CHF, chronic     A. Diag 07/2011, EF 35%  . Lumbar disc disease     laminectomy 2001  . History of endometrial cancer   . Hypertension   . Hyperlipidemia   . Rheumatoid arthritis     treated in past with Remicade and developed vasculitis  . Gout   . Complication of anesthesia   . PONV (postoperative nausea and vomiting)   . Myocardial infarction 01/06/2013    nestmi  . Shortness of breath   . GERD (gastroesophageal reflux disease)   . Coronary artery dissection 6-01/2013    95% OM3 hazy lesion on cath, medically managed    Past Surgical History  Procedure Laterality Date  . Appendectomy    . Abdominal hysterectomy  2000    Stage 1 endometrial cancer  . Hernia repair  09/2008  . Hernia repair  04/17/11    lap ventral incisional hernia repair with mesh   . Gastric biopsy      benign  . Excision of lipoma    . Lumbar laminectomy  2001  . Hammer toe surgery    . Cardiac catheterization  01/08/13    OM3 95% lesion felt to represent coronary dissection, mild luminal irregularities elsewhere; EF 40-45%; medically managed    GNF:AOZHYQ of systems complete and found to be negative unless listed above  PHYSICAL EXAM BP 118/64  Pulse 77  Ht 5\' 5"  (1.651 m)  Wt 173 lb (78.472 kg)  BMI 28.79 kg/m2  SpO2 97%  General: Well developed, well nourished, in no acute distress Head: Eyes PERRLA, No xanthomas.   Normal cephalic and atramatic  Lungs: Clear bilaterally to auscultation and percussion. Heart: HRIR S1 S2, rate heart with 1/6 systolic murmur..  Pulses are 2+ & equal.             No carotid bruit. No JVD.  No abdominal bruits. No femoral bruits. Abdomen: Bowel sounds are positive, abdomen soft and non-tender without masses or                  Hernia's noted. Msk:  Back normal, slow gait. Favoring right leg with a limp. Wrapped in ACE. Normal strength and tone for age. Extremities: No clubbing,  cyanosis or edema.  DP +1 Neuro: Alert and oriented X 3. Psych:  Good affect, responds appropriately    ASSESSMENT AND PLAN

## 2013-01-23 NOTE — Assessment & Plan Note (Signed)
She is doing well. She has not had to take nitroglycerin, she has had no recurrent chest pain, I have warned her that she is trying to do more than she needs at this time. She wants to go back to her usual activities but often becomes tired. I have advised her to take her time and to increase her activity slowly. She has not returned to work until after being seen again in one month for ongoing assessment. He works as a [employer] officer, and has been told that she may take as much time as she needs. She will followup with Dr. Beulah Gandy to be est. with him.

## 2013-01-23 NOTE — Telephone Encounter (Signed)
New problem    Need order for telehealth monitoring system

## 2013-01-23 NOTE — Progress Notes (Signed)
  Error. Note written on another encounter.

## 2013-01-23 NOTE — Assessment & Plan Note (Signed)
Currently well compensated. There is no evidence of fluid overload, dyspnea on exertion, or edema appear she continues medically compliant. Labs will need to be drawn to include a BMET for kidney function. She had labs one week ago just prior to discharge. Sodium was 143 potassium 4.6 chloride 105, creatinine 1.67. She is due to see her primary care physician in 2 weeks. At which time labs were redrawn again we have requested copies. No changes in her medication regimen at this time.

## 2013-01-23 NOTE — Patient Instructions (Addendum)
Your physician recommends that you schedule a follow-up appointment in: 1 MONTH with Dr. Purvis Sheffield  Your physician recommends that you continue on your current medications as directed. Please refer to the Current Medication list given to you today.

## 2013-01-23 NOTE — Telephone Encounter (Signed)
Okay to start the telehealth monitoring Mylo Red RN

## 2013-01-23 NOTE — Assessment & Plan Note (Signed)
Heart rate is currently well-controlled on this visit. She continues on chlorate 0.125 mg twice a day. She remains on Coumadin. No evidence of overt bleeding or complaints somatically.

## 2013-01-24 ENCOUNTER — Encounter: Payer: Self-pay | Admitting: Adult Health

## 2013-01-27 ENCOUNTER — Ambulatory Visit (INDEPENDENT_AMBULATORY_CARE_PROVIDER_SITE_OTHER): Payer: Medicare Other | Admitting: Cardiology

## 2013-01-27 ENCOUNTER — Ambulatory Visit (HOSPITAL_COMMUNITY): Payer: Medicare Other | Admitting: *Deleted

## 2013-01-27 DIAGNOSIS — Z7901 Long term (current) use of anticoagulants: Secondary | ICD-10-CM

## 2013-01-27 DIAGNOSIS — I4891 Unspecified atrial fibrillation: Secondary | ICD-10-CM

## 2013-01-27 LAB — POCT INR: INR: 3

## 2013-02-03 ENCOUNTER — Ambulatory Visit (INDEPENDENT_AMBULATORY_CARE_PROVIDER_SITE_OTHER): Payer: Medicare Other | Admitting: *Deleted

## 2013-02-03 ENCOUNTER — Telehealth: Payer: Self-pay | Admitting: *Deleted

## 2013-02-03 DIAGNOSIS — Z7901 Long term (current) use of anticoagulants: Secondary | ICD-10-CM

## 2013-02-03 DIAGNOSIS — I4891 Unspecified atrial fibrillation: Secondary | ICD-10-CM

## 2013-02-03 NOTE — Telephone Encounter (Signed)
inr 3.1

## 2013-02-03 NOTE — Telephone Encounter (Signed)
See coumadin note. 

## 2013-02-04 ENCOUNTER — Encounter: Payer: Self-pay | Admitting: Cardiovascular Disease

## 2013-02-07 ENCOUNTER — Telehealth: Payer: Self-pay | Admitting: Cardiology

## 2013-02-07 NOTE — Telephone Encounter (Signed)
Follow Up     [facility] following up on some paperwork that was faxed over on 7/14 and 7/17 regarding skilled nursing. Please call.

## 2013-02-07 NOTE — Telephone Encounter (Signed)
Marissa [name]  Aware & will get papers signed by 02/21/13 Mylo Red RN

## 2013-02-10 ENCOUNTER — Telehealth: Payer: Self-pay | Admitting: *Deleted

## 2013-02-10 ENCOUNTER — Ambulatory Visit (INDEPENDENT_AMBULATORY_CARE_PROVIDER_SITE_OTHER): Payer: Medicare Other | Admitting: *Deleted

## 2013-02-10 ENCOUNTER — Telehealth: Payer: Self-pay | Admitting: Cardiology

## 2013-02-10 DIAGNOSIS — I4891 Unspecified atrial fibrillation: Secondary | ICD-10-CM

## 2013-02-10 DIAGNOSIS — Z7901 Long term (current) use of anticoagulants: Secondary | ICD-10-CM

## 2013-02-10 MED ORDER — FUROSEMIDE 40 MG PO TABS: 60.0000 mg | ORAL_TABLET | Freq: Two times a day (BID) | ORAL | Status: DC

## 2013-02-10 NOTE — Telephone Encounter (Signed)
INR 3.0 / please call with instructions / tgs  °

## 2013-02-10 NOTE — Telephone Encounter (Signed)
Opened in error to assist pt with refill on lasix

## 2013-02-10 NOTE — Telephone Encounter (Signed)
See coumadin note. 

## 2013-02-10 NOTE — Telephone Encounter (Signed)
Noted incoming call from AG to advise recent change in pt Lasix and pt is running out today per Lasix 60mg  BID from last sig of Lasix 40mg  BID, pt to take one and a half tablet and was discharged from hospital with this RX, called into [facility] to give verbal order to have the medication delivered for pt, spoke to [facility] rep tonya whom advised she did receive the RX and will have it delivered to pt home, pt made aware

## 2013-02-20 ENCOUNTER — Ambulatory Visit (INDEPENDENT_AMBULATORY_CARE_PROVIDER_SITE_OTHER): Payer: Medicare Other | Admitting: *Deleted

## 2013-02-20 DIAGNOSIS — I4891 Unspecified atrial fibrillation: Secondary | ICD-10-CM

## 2013-02-20 DIAGNOSIS — Z7901 Long term (current) use of anticoagulants: Secondary | ICD-10-CM

## 2013-02-26 ENCOUNTER — Encounter: Payer: Self-pay | Admitting: Adult Health

## 2013-02-28 ENCOUNTER — Encounter: Payer: Self-pay | Admitting: Cardiovascular Disease

## 2013-02-28 ENCOUNTER — Ambulatory Visit: Payer: Medicare Other | Admitting: Cardiovascular Disease

## 2013-02-28 ENCOUNTER — Ambulatory Visit (INDEPENDENT_AMBULATORY_CARE_PROVIDER_SITE_OTHER): Payer: Medicare Other | Admitting: Cardiovascular Disease

## 2013-02-28 VITALS — BP 124/74 | HR 92 | Ht 65.0 in | Wt 176.2 lb

## 2013-02-28 DIAGNOSIS — Z7901 Long term (current) use of anticoagulants: Secondary | ICD-10-CM

## 2013-02-28 DIAGNOSIS — I2510 Atherosclerotic heart disease of native coronary artery without angina pectoris: Secondary | ICD-10-CM

## 2013-02-28 DIAGNOSIS — Z8679 Personal history of other diseases of the circulatory system: Secondary | ICD-10-CM

## 2013-02-28 DIAGNOSIS — I5022 Chronic systolic (congestive) heart failure: Secondary | ICD-10-CM

## 2013-02-28 DIAGNOSIS — I472 Ventricular tachycardia: Secondary | ICD-10-CM

## 2013-02-28 DIAGNOSIS — I4891 Unspecified atrial fibrillation: Secondary | ICD-10-CM

## 2013-02-28 NOTE — Patient Instructions (Addendum)
Your physician recommends that you schedule a follow-up appointment in: 6 MONTHS  Your physician has recommended you make the following change in your medication:   1) DECREASE LASIX TO 40MG TWICE DAILY 

## 2013-02-28 NOTE — Progress Notes (Signed)
Patient ID: Marissa [name], female   DOB: December 19, 1932, 79 y.o.   MRN: [id number]    SUBJECTIVE: Marissa [name] is a medically complicated patient (former pt of Dr. Juanito Doom), with a history of CAD, an ischemic cardiomyopathy, chronic systolic heart failure, and permanent atrial fibrillation. She also has long-standing rheumatoid arthritis since age 47 with known vasculitis sequelae   The patient was admitted this summer to [facility] in the setting of a non-ST elevation myocardial infarction with symptoms of bilateral arm pain and shortness of breath. On arrival initially she was found to be in A. fib with RVR, with troponin trend highest at 20.0. She had a cardiac catheterization, revealing one-vessel coronary artery disease with diffuse 95% stenosis in the proximal OM 3. PCI was not completed as the area was very tortuous and the distal vessel distribution was very small. She had subsequently sustained an episode of stable monomorphic V. tach status post successful CPR and defibrillation x1 the same afternoon after catheterization, which was deemed to be ischemic in etiology.  After approximately 10 days in the hospital the patient was released, and found to have an EF of 40-45%.  She also had episodes of sinus bradycardia, junctional rhythm, and pauses greater than 3 seconds, and her Digoxin and Toprol XL were discontinued.  She remains on Coumadin and is seen in our Coumadin clinic in [place]. She is followed by home health nurses for INR checks.   On today's visit she is feeling well without complaints of progressive shortness of breath (no worse than baseline) and chest pain. She had an episode of vertigo this morning, but otherwise feels well. She uses a cane for ambulation. Her weight has been maintained, her heart rate is doing well, and she is asymptomatic.   She weighs herself daily. Her daughter [name], who had been a nurse with Dr. Daleen Squibb when he first graduated med school, and then at  [employer]) tells me she sits at a table a lot working on [place]. Her home health nurse (visits twice a week) and has not noted any increased swelling, nor has the patient. She's still undergoing dressing changes for lower extremity cellulitis.  They moved here from [place] 2 years ago, but are originally from [place].   Filed Vitals:   02/28/13 1340  Height: 5\' 5"  (1.651 m)  Weight: 176 lb 4 oz (79.946 kg)   BP 124/74  Pulse 92    PHYSICAL EXAM General: NAD Neck: No JVD, no thyromegaly or thyroid nodule.  Lungs: Clear to auscultation bilaterally with normal respiratory effort. CV: Nondisplaced PMI.  Heart irregular rhythm, rate-controlled, normal S1/S2, no S3/S4, II/VI holosystolic murmur.  Trace peripheral edema with stockings on.  No carotid bruit.  Normal pedal pulses.  Abdomen: Soft, nontender, no hepatosplenomegaly, no distention.  Neurologic: Alert and oriented x 3.  Psych: Normal affect. Extremities: No clubbing or cyanosis. Vasculitic changes noted on legs.    LABS: Basic Metabolic Panel: No results found for this basename: NA, K, CL, CO2, GLUCOSE, BUN, CREATININE, CALCIUM, MG, PHOS,  in the last 72 hours Liver Function Tests: No results found for this basename: AST, ALT, ALKPHOS, BILITOT, PROT, ALBUMIN,  in the last 72 hours No results found for this basename: LIPASE, AMYLASE,  in the last 72 hours CBC: No results found for this basename: WBC, NEUTROABS, HGB, HCT, MCV, PLT,  in the last 72 hours Cardiac Enzymes: No results found for this basename: CKTOTAL, CKMB, CKMBINDEX, TROPONINI,  in the last 72 hours BNP:  No components found with this basename: POCBNP,  D-Dimer: No results found for this basename: DDIMER,  in the last 72 hours Hemoglobin A1C: No results found for this basename: HGBA1C,  in the last 72 hours Fasting Lipid Panel: No results found for this basename: CHOL, HDL, LDLCALC, TRIG, CHOLHDL, LDLDIRECT,  in the last 72 hours Thyroid Function  Tests: No results found for this basename: TSH, T4TOTAL, FREET3, T3FREE, THYROIDAB,  in the last 72 hours Anemia Panel: No results found for this basename: VITAMINB12, FOLATE, FERRITIN, TIBC, IRON, RETICCTPCT,  in the last 72 hours  Echo (June 2014); - Left ventricle: The cavity size was mildly dilated. Wall thickness was increased in a pattern of mild LVH. Systolic function was mildly to moderately reduced. The estimated ejection fraction was in the range of 40% to 45%. Diffuse hypokinesis. There is hypokinesis of the anteroseptal myocardium. - Mitral valve: Calcified annulus. Mild regurgitation. - Left atrium: The atrium was moderately dilated. - Right ventricle: The cavity size was mildly dilated. - Right atrium: The atrium was moderately to severely dilated. - Tricuspid valve: Moderate regurgitation. - Pulmonary arteries: PA peak pressure: 54mm Hg (S).  CARDIAC CATHETERIZATION - 01/08/13  Hemodynamics:  AO: 132/73 mmHg  LV: 138/7 mmHg  LVEDP: 13 mmHg  Coronary angiography:  Coronary dominance: Right  Left Main: Normal  Left Anterior Descending (LAD): Large in size with minor irregularities. The vessel wraps around the apex.  1st diagonal (D1): Normal in size with minor irregularities.  2nd diagonal (D2): Normal in size with no significant disease.  3rd diagonal (D3): Very small in size.  Circumflex (LCx): Normal in size and nondominant. The vessel has no significant disease.  1st obtuse marginal: Small in size with no significant disease.  2nd obtuse marginal: Small in size with no significant disease.  3rd obtuse marginal: Normal in size with diffuse 95% disease proximally in a very tortuous segment. This is likely the culprit for non-ST elevation myocardial infarction.  Right Coronary Artery: Normal in size and dominant. There are minor luminal irregularities proximally. Otherwise no significant disease. Left ventriculography: Was not performed due to recent renal failure. EF  by echo was 40-45%.  Final Conclusions:  1. Significant 1 vessel coronary artery disease with diffuse 95% stenosis in proximal OM 3. The affected area is significantly tortuous and the distal vessel distribution is relatively small in size. Thus, risks of PCI outweigh the benefits.  2. Normal LVEDP.      ASSESSMENT AND PLAN: 1. CAD: on Coreg, Lipitor, and Losartan. She is on Warfarin, and I'm assuming this is why she is not also on ASA. 2. Chronic systolic heart failure: euvolemic and stable. EF 40-45%. On Carvedilol and Losartan, as well as daily Furosemide 60 mg twice daily. Na 147, K 3.3, BUN 31, Creatinine 1.34 on 02-17-2013.  I will reduce Lasix to 40 mg twice daily. Dr. [name] replaced her potassium 3. Atrial fibrillation: heart rate controlled today on Coreg. She had been on Digoxin in the past but was noted to go into a junctional rhythm and had tachy-brady syndrome, and was discontinued for this reason. 4. Tachy-brady syndrome: junctional rhythm resolved off of Digoxin and Toprol XL.  5. HTN: controlled on present therapy. 6. Hyperlipidemia: on Lipitor. 7. NSVT: no symptoms at present, no obvious recurrences. On Coreg.   Prentice Docker, M.D., F.A.C.C.

## 2013-03-06 ENCOUNTER — Telehealth: Payer: Self-pay | Admitting: *Deleted

## 2013-03-06 ENCOUNTER — Ambulatory Visit (INDEPENDENT_AMBULATORY_CARE_PROVIDER_SITE_OTHER): Payer: Medicare Other | Admitting: Cardiology

## 2013-03-06 DIAGNOSIS — I4891 Unspecified atrial fibrillation: Secondary | ICD-10-CM

## 2013-03-06 DIAGNOSIS — Z7901 Long term (current) use of anticoagulants: Secondary | ICD-10-CM

## 2013-03-06 NOTE — Telephone Encounter (Signed)
INR - 2.2 please call with instructions / tgs  °

## 2013-03-06 NOTE — Telephone Encounter (Signed)
Spoke with sarah from [facility], orders given, encounter opened

## 2013-03-17 ENCOUNTER — Ambulatory Visit (INDEPENDENT_AMBULATORY_CARE_PROVIDER_SITE_OTHER): Payer: Medicare Other | Admitting: *Deleted

## 2013-03-17 DIAGNOSIS — Z7901 Long term (current) use of anticoagulants: Secondary | ICD-10-CM

## 2013-03-17 DIAGNOSIS — I4891 Unspecified atrial fibrillation: Secondary | ICD-10-CM

## 2013-04-14 ENCOUNTER — Ambulatory Visit (INDEPENDENT_AMBULATORY_CARE_PROVIDER_SITE_OTHER): Payer: Medicare Other | Admitting: *Deleted

## 2013-04-14 DIAGNOSIS — I4891 Unspecified atrial fibrillation: Secondary | ICD-10-CM

## 2013-04-14 DIAGNOSIS — Z7901 Long term (current) use of anticoagulants: Secondary | ICD-10-CM

## 2013-05-05 ENCOUNTER — Telehealth: Payer: Self-pay | Admitting: *Deleted

## 2013-05-05 DIAGNOSIS — I10 Essential (primary) hypertension: Secondary | ICD-10-CM

## 2013-05-05 NOTE — Telephone Encounter (Signed)
Received incoming call from [name] who advised the pt has noted weight gain of 3lbs over 3 days, pt took 60mg  of lasix on 05-03-13. 40mg  of lasix on 05-04-13 and another increase of 60mg  this am with no weight loss noted, pt also noting SOB more than usual please advise

## 2013-05-05 NOTE — Telephone Encounter (Signed)
This nurse placed orders for pt BMET in the chart, contacted AG RN to advise pt instructions, she advised she will inform the pt of the instructions, Dr. Purvis Sheffield was verbally advised by this nurse that the symptom mild swelling in feet/legs was left out of the original notation, Dr. Purvis Sheffield made instructions post additional symptom, AG will advise pt to call our office with any further instructions if needed.

## 2013-05-05 NOTE — Addendum Note (Signed)
Addended by: Derry Lory A on: 05/05/2013 01:48 PM   Modules accepted: Orders

## 2013-05-05 NOTE — Telephone Encounter (Signed)
Marissa Linden, MD Ovidio Kin, LPN            Please have pt take Lasix 60 mg bid x 3 days, then back to 40 mg bid. Have her get a BMET this Friday.

## 2013-05-09 LAB — BASIC METABOLIC PANEL
CO2: 32 mEq/L (ref 19–32)
Chloride: 108 mEq/L (ref 96–112)
Glucose, Bld: 84 mg/dL (ref 70–99)
Potassium: 3.5 mEq/L (ref 3.5–5.3)
Sodium: 148 mEq/L — ABNORMAL HIGH (ref 135–145)

## 2013-05-12 ENCOUNTER — Ambulatory Visit (INDEPENDENT_AMBULATORY_CARE_PROVIDER_SITE_OTHER): Payer: Medicare Other | Admitting: *Deleted

## 2013-05-12 DIAGNOSIS — I4891 Unspecified atrial fibrillation: Secondary | ICD-10-CM

## 2013-05-12 DIAGNOSIS — Z7901 Long term (current) use of anticoagulants: Secondary | ICD-10-CM

## 2013-05-12 LAB — POCT INR: INR: 2.5

## 2013-05-13 ENCOUNTER — Other Ambulatory Visit: Payer: Self-pay | Admitting: Physician Assistant

## 2013-05-14 ENCOUNTER — Encounter: Payer: Self-pay | Admitting: *Deleted

## 2013-05-14 MED ORDER — FUROSEMIDE 40 MG PO TABS: ORAL_TABLET | ORAL | Status: DC

## 2013-05-14 NOTE — Addendum Note (Signed)
Addended by: Dorothey Baseman C on: 05/14/2013 11:26 AM   Modules accepted: Orders

## 2013-05-19 ENCOUNTER — Other Ambulatory Visit: Payer: Self-pay | Admitting: Physician Assistant

## 2013-05-23 ENCOUNTER — Encounter: Payer: Self-pay | Admitting: *Deleted

## 2013-05-23 LAB — BASIC METABOLIC PANEL
BUN: 25 mg/dL — ABNORMAL HIGH (ref 6–23)
CO2: 30 mEq/L (ref 19–32)
Chloride: 105 mEq/L (ref 96–112)
Creat: 1.39 mg/dL — ABNORMAL HIGH (ref 0.50–1.10)

## 2013-05-30 ENCOUNTER — Telehealth: Payer: Self-pay | Admitting: *Deleted

## 2013-05-30 NOTE — Telephone Encounter (Signed)
FYI : new medication added - Raloxifene HCL 60mg  / tgs

## 2013-05-30 NOTE — Telephone Encounter (Signed)
Pt was started on Evista 60mg  daily for osteoporosis.  Potential side effect that this med may decrease INR.  Will continue current dose of coumadin and move up INR appt to 06/09/13/  [facility] for pt.

## 2013-06-09 ENCOUNTER — Ambulatory Visit (INDEPENDENT_AMBULATORY_CARE_PROVIDER_SITE_OTHER): Payer: Medicare Other | Admitting: *Deleted

## 2013-06-09 DIAGNOSIS — I4891 Unspecified atrial fibrillation: Secondary | ICD-10-CM

## 2013-06-09 DIAGNOSIS — Z7901 Long term (current) use of anticoagulants: Secondary | ICD-10-CM

## 2013-06-09 DIAGNOSIS — Z5181 Encounter for therapeutic drug level monitoring: Secondary | ICD-10-CM

## 2013-06-30 ENCOUNTER — Other Ambulatory Visit (HOSPITAL_COMMUNITY): Payer: Self-pay | Admitting: Neurosurgery

## 2013-06-30 DIAGNOSIS — M48061 Spinal stenosis, lumbar region without neurogenic claudication: Secondary | ICD-10-CM

## 2013-07-01 ENCOUNTER — Encounter: Payer: Self-pay | Admitting: Cardiovascular Disease

## 2013-07-02 ENCOUNTER — Ambulatory Visit (HOSPITAL_COMMUNITY): Payer: Medicare Other

## 2013-07-05 ENCOUNTER — Other Ambulatory Visit: Payer: Self-pay | Admitting: Physician Assistant

## 2013-07-09 ENCOUNTER — Ambulatory Visit (HOSPITAL_COMMUNITY)
Admission: RE | Admit: 2013-07-09 | Discharge: 2013-07-09 | Disposition: A | Discharge status: Home / Self Care | Payer: Medicare Other | Source: Ambulatory Visit | Attending: Neurosurgery | Admitting: Neurosurgery

## 2013-07-09 DIAGNOSIS — M51379 Other intervertebral disc degeneration, lumbosacral region without mention of lumbar back pain or lower extremity pain: Secondary | ICD-10-CM | POA: Insufficient documentation

## 2013-07-09 DIAGNOSIS — M545 Low back pain, unspecified: Secondary | ICD-10-CM | POA: Insufficient documentation

## 2013-07-09 DIAGNOSIS — M48061 Spinal stenosis, lumbar region without neurogenic claudication: Secondary | ICD-10-CM | POA: Insufficient documentation

## 2013-07-09 DIAGNOSIS — M5137 Other intervertebral disc degeneration, lumbosacral region: Secondary | ICD-10-CM | POA: Insufficient documentation

## 2013-07-09 DIAGNOSIS — M539 Dorsopathy, unspecified: Secondary | ICD-10-CM | POA: Insufficient documentation

## 2013-07-17 ENCOUNTER — Telehealth: Payer: Self-pay | Admitting: *Deleted

## 2013-07-17 NOTE — Telephone Encounter (Signed)
Spoke with pt.  Will have [name] RN [facility] come out and check INR next week.  She is out out of the office this week.  Pt in agreement.

## 2013-07-17 NOTE — Telephone Encounter (Signed)
Pt broke hip. Canceled appt for today, but she needs to know what she soul do, she is on bed rest.

## 2013-07-21 LAB — PROTIME-INR: INR: 4.9 — AB (ref 0.9–1.1)

## 2013-07-22 ENCOUNTER — Telehealth: Payer: Self-pay | Admitting: Cardiology

## 2013-07-22 ENCOUNTER — Ambulatory Visit (INDEPENDENT_AMBULATORY_CARE_PROVIDER_SITE_OTHER): Payer: Medicare Other | Admitting: *Deleted

## 2013-07-22 DIAGNOSIS — Z7901 Long term (current) use of anticoagulants: Secondary | ICD-10-CM

## 2013-07-22 DIAGNOSIS — I4891 Unspecified atrial fibrillation: Secondary | ICD-10-CM

## 2013-07-22 NOTE — Telephone Encounter (Signed)
FYI : Patient's pain meds have been changed to Tramadol / tgs

## 2013-07-22 NOTE — Telephone Encounter (Signed)
Added to med list

## 2013-07-28 LAB — PROTIME-INR: INR: 1.6 — AB (ref 0.9–1.1)

## 2013-07-30 ENCOUNTER — Encounter: Payer: Self-pay | Admitting: Cardiovascular Disease

## 2013-07-30 ENCOUNTER — Telehealth: Payer: Self-pay | Admitting: *Deleted

## 2013-07-30 NOTE — Telephone Encounter (Signed)
Pt calling to make sure you got lab report from Monday

## 2013-07-31 ENCOUNTER — Other Ambulatory Visit: Payer: Self-pay | Admitting: Physician Assistant

## 2013-07-31 ENCOUNTER — Ambulatory Visit (INDEPENDENT_AMBULATORY_CARE_PROVIDER_SITE_OTHER): Payer: Medicare Other | Admitting: *Deleted

## 2013-07-31 DIAGNOSIS — Z7901 Long term (current) use of anticoagulants: Secondary | ICD-10-CM

## 2013-07-31 DIAGNOSIS — I4891 Unspecified atrial fibrillation: Secondary | ICD-10-CM

## 2013-07-31 NOTE — Telephone Encounter (Signed)
See coumadin note. 

## 2013-08-05 ENCOUNTER — Telehealth: Payer: Self-pay | Admitting: *Deleted

## 2013-08-05 LAB — PROTIME-INR: INR: 2.6 — AB (ref 0.9–1.1)

## 2013-08-05 NOTE — Telephone Encounter (Signed)
.  left message to have patient return my call to schedule apt this week

## 2013-08-05 NOTE — Telephone Encounter (Signed)
This is a medically complex patient (see my most recent office visit note). She has a known ischemic cardiomyopathy with chronic systolic heart failure. I would have her come into the office for evaluation this week (if my schedule is busy, have her see Samara Deist). She will likely need labs as well.

## 2013-08-05 NOTE — Telephone Encounter (Signed)
[name] RN with [facility] called to advise she has been checking pt PT/INR for this pt in home and reviewing with LR RN per pt on bed rest due to compressed Fracture in her Lower Lumbar Spine, pt INR checked today by AG RN and pt complained that she notes SOB from time to time when her hear feels like it is beating slow over the past 2-3 days, AG listen to her lungs/heart and noted lungs clear and sounds not indicative of heart failure, HR ranging 75-95, pt denies chest pain/dizzyness only notes feeling "weird", pt also notes this gets better when she sits up and takes a deep breath or props her back up on pillows, please advise

## 2013-08-05 NOTE — Telephone Encounter (Signed)
This is a medically complex patient (see my most recent office visit note). She has a known ischemic cardiomyopathy with chronic systolic heart failure.

## 2013-08-05 NOTE — Telephone Encounter (Signed)
This nurse contacted Dr. Purvis Sheffield to confirm the notation was received, noted currently in hospital with pts will address asap

## 2013-08-05 NOTE — Telephone Encounter (Signed)
Message left for AG RN with [facility] and pt vm to advise instructions still pending at this time, if sxs worsen to please go to the ED to be evaluated

## 2013-08-06 NOTE — Telephone Encounter (Signed)
ERD:EYCXK to [employer] and pt to offer apt today with ML NP at 1:30pm, apt declined per pt is bed ridden and will take a moderate time to plan her transport that may even involve an ambulance ride, AG RN with [facility] accepted apt on behalf of the pt for tomorrow 08-07-13 with KL NP at 3:50pm per offered the 3:30pm apt however noted the last apt time would assist the pt in the best way, AG RN advised she will contact our office if they are unable to resolve the transportation issue due to pt current state and bed ridden

## 2013-08-07 ENCOUNTER — Ambulatory Visit (INDEPENDENT_AMBULATORY_CARE_PROVIDER_SITE_OTHER): Payer: Medicare Other | Admitting: Adult Health

## 2013-08-07 ENCOUNTER — Ambulatory Visit (INDEPENDENT_AMBULATORY_CARE_PROVIDER_SITE_OTHER): Payer: Medicare Other | Admitting: *Deleted

## 2013-08-07 ENCOUNTER — Encounter: Payer: Self-pay | Admitting: Adult Health

## 2013-08-07 VITALS — BP 128/33 | HR 117 | Ht 65.0 in | Wt 179.0 lb

## 2013-08-07 DIAGNOSIS — I4891 Unspecified atrial fibrillation: Secondary | ICD-10-CM

## 2013-08-07 DIAGNOSIS — Z7901 Long term (current) use of anticoagulants: Secondary | ICD-10-CM

## 2013-08-07 DIAGNOSIS — Z5181 Encounter for therapeutic drug level monitoring: Secondary | ICD-10-CM

## 2013-08-07 DIAGNOSIS — I428 Other cardiomyopathies: Secondary | ICD-10-CM

## 2013-08-07 DIAGNOSIS — I5022 Chronic systolic (congestive) heart failure: Secondary | ICD-10-CM

## 2013-08-07 DIAGNOSIS — I10 Essential (primary) hypertension: Secondary | ICD-10-CM

## 2013-08-07 MED ORDER — FUROSEMIDE 40 MG PO TABS
40.0000 mg | ORAL_TABLET | Freq: Every day | ORAL | Status: DC
Start: 2013-08-07 — End: 2013-08-26

## 2013-08-07 MED ORDER — CARVEDILOL 6.25 MG PO TABS: ORAL_TABLET | ORAL | Status: DC

## 2013-08-07 NOTE — Assessment & Plan Note (Signed)
Secondary to decreased ejection fraction. She has a high likelihood of going into CHF with rapid heart rhythm. As stated we will increase her carvedilol to 6.25 mg twice a day, watching closely and carefully for blood pressure control and reviewed cardiac monitor for significant changes in heart rate leading to bradycardia.  She will have close followup with Dr. Beulah Gandy in 2 weeks.  Of note, the daughter is requesting a hospital bed for her as she is having trouble lying flat with her lumbar fracture, and occasional difficulty breathing. She often has to place pillows behind her in order to eat. The patient does not want to have a hospital bed. I advised that she and her daughter need to talk about this, and if they would like to proceed with having one temporarily, they are to contact [name] RN with [facility] to coordinate this.  I have spent 30 minutes with this patient and her daughter.

## 2013-08-07 NOTE — Assessment & Plan Note (Signed)
Blood pressure is currently well-controlled. We will monitor this closely with increased dose of carvedilol.

## 2013-08-07 NOTE — Patient Instructions (Signed)
Your physician recommends that you schedule a follow-up appointment in: 2 weeks with Dr Purvis Sheffield  Your physician has recommended that you wear an event monitor. Event monitors are medical devices that record the heart's electrical activity. Doctors most often [place] these monitors to diagnose arrhythmias. Arrhythmias are problems with the speed or rhythm of the heartbeat. The monitor is a small, portable device. You can wear one while you do your normal daily activities. This is usually used to diagnose what is causing palpitations/syncope (passing out). For 7 days  Your physician recommends that you return for lab work. CBC, BMET  Your physician has recommended you make the following change in your medication:  1. Increase Coreg to 6.25 twice a day 2. Take Lasix 40 mg in the AM

## 2013-08-07 NOTE — Progress Notes (Deleted)
Name: Marissa [name]    DOB: 1933-03-07  Age: 80 y.o.  MR#: [id number]       PCP:  Carylon Perches, MD      Insurance: Payor: MEDICARE / Plan: MEDICARE PART A AND B / Product Type: *No Product type* /   CC:    Chief Complaint  Patient presents with  . Cardiomyopathy  . Coronary Artery Disease  . Atrial Fibrillation    VS Filed Vitals:   08/07/13 1558  BP: 128/33  Pulse: 117  Height: 5\' 5"  (1.651 m)  Weight: 179 lb (81.194 kg)  SpO2: 100%    Weights Current Weight  08/07/13 179 lb (81.194 kg)  02/28/13 176 lb 4 oz (79.946 kg)  01/23/13 173 lb (78.472 kg)    Blood Pressure  BP Readings from Last 3 Encounters:  08/07/13 128/33  02/28/13 124/74  01/23/13 118/64     Admit date:  (Not on file) Last encounter with RMR:  Visit date not found   Allergy Codeine and Penicillins  Current Outpatient Prescriptions  Medication Sig Dispense Refill  . atorvastatin (LIPITOR) 40 MG tablet TAKE ONE TABLET BY MOUTH ONCE DAILY.  30 tablet  3  . carvedilol (COREG) 3.125 MG tablet TAKE 1 TABLET BY MOUTH TWICE DAILY WITH MEALS.  60 tablet  2  . Cholecalciferol (VITAMIN D) 2000 UNITS CAPS Take 2,000 Units by mouth daily.      . febuxostat (ULORIC) 40 MG tablet Take 40 mg by mouth daily.      . furosemide (LASIX) 40 MG tablet The Lasix 40 mg in the A.M. And 20 mg in the P.M  60 tablet  6  . gabapentin (NEURONTIN) 300 MG capsule Take 300 mg by mouth at bedtime.      . hydroxychloroquine (PLAQUENIL) 200 MG tablet Take 1 tablet by mouth Twice daily.      . Hypromellose (GENTEAL OP) Place 1 drop into both eyes daily as needed. Tired Eyes      . losartan (COZAAR) 50 MG tablet TAKE ONE TABLET BY MOUTH ONCE DAILY.  30 tablet  6  . nitroGLYCERIN (NITROSTAT) 0.4 MG SL tablet Place 1 tablet (0.4 mg total) under the tongue every 5 (five) minutes as needed for chest pain.  25 tablet  12  . potassium chloride (K-DUR) 10 MEQ tablet Take 10 mEq by mouth 2 (two) times daily.      . predniSONE (DELTASONE) 5 MG  tablet Take 5 mg by mouth daily.      . raloxifene (EVISTA) 60 MG tablet Take 60 mg by mouth daily.      . traMADol (ULTRAM) 50 MG tablet Take 50 mg by mouth.      . warfarin (COUMADIN) 5 MG tablet Take 1 tablet daily except 1/2 tablet on Mondays and Thursdays  30 tablet  6   No current facility-administered medications for this visit.    Discontinued Meds:    Medications Discontinued During This Encounter  Medication Reason  . HYDROcodone-acetaminophen (NORCO/VICODIN) 5-325 MG per tablet Error  . potassium chloride (K-DUR,KLOR-CON) 10 MEQ tablet Error  . folic acid (FOLVITE) 1 MG tablet Error    Patient Active Problem List   Diagnosis Date Noted  . Tachy-brady syndrome 01/16/2013  . Acute on chronic kidney disease, stage 3 01/16/2013  . Coronary artery dissection 01/16/2013  . Sustained VT (ventricular tachycardia) 01/07/2013  . Non-ST elevation myocardial infarction (NSTEMI), initial care episode 01/06/2013  . Acute on chronic systolic congestive heart failure 01/05/2013  .  History of endometrial cancer   . Lumbar disc disease   . Hypertension   . Hyperlipidemia   . Rheumatoid arthritis   . Gout   . Cellulitis of right leg   . Nonischemic cardiomyopathy 08/09/2011  . Chronic systolic heart failure 08/09/2011  . Chronic anticoagulation 10/07/2010  . Chronic atrial fibrillation     LABS    Component Value Date/Time   NA 146* 05/22/2013 1444   NA 148* 05/09/2013 0815   NA 143 01/16/2013 0535   K 3.9 05/22/2013 1444   K 3.5 05/09/2013 0815   K 4.6 01/16/2013 0535   CL 105 05/22/2013 1444   CL 108 05/09/2013 0815   CL 105 01/16/2013 0535   CO2 30 05/22/2013 1444   CO2 32 05/09/2013 0815   CO2 24 01/16/2013 0535   GLUCOSE 93 05/22/2013 1444   GLUCOSE 84 05/09/2013 0815   GLUCOSE 86 01/16/2013 0535   BUN 25* 05/22/2013 1444   BUN 30* 05/09/2013 0815   BUN 42* 01/16/2013 0535   CREATININE 1.39* 05/22/2013 1444   CREATININE 1.36* 05/09/2013 0815   CREATININE 1.67*  01/16/2013 0535   CREATININE 1.48* 01/15/2013 0745   CREATININE 1.41* 01/14/2013 0405   CREATININE 1.04 11/09/2010 1227   CALCIUM 9.4 05/22/2013 1444   CALCIUM 9.4 05/09/2013 0815   CALCIUM 10.0 01/16/2013 0535   GFRNONAA 28* 01/16/2013 0535   GFRNONAA 33* 01/15/2013 0745   GFRNONAA 35* 01/14/2013 0405   GFRAA 33* 01/16/2013 0535   GFRAA 38* 01/15/2013 0745   GFRAA 40* 01/14/2013 0405   CMP     Component Value Date/Time   NA 146* 05/22/2013 1444   K 3.9 05/22/2013 1444   CL 105 05/22/2013 1444   CO2 30 05/22/2013 1444   GLUCOSE 93 05/22/2013 1444   BUN 25* 05/22/2013 1444   CREATININE 1.39* 05/22/2013 1444   CREATININE 1.67* 01/16/2013 0535   CALCIUM 9.4 05/22/2013 1444   PROT 7.1 01/06/2013 0028   ALBUMIN 3.5 01/06/2013 0028   AST 69* 01/06/2013 0028   ALT 24 01/06/2013 0028   ALKPHOS 53 01/06/2013 0028   BILITOT 0.4 01/06/2013 0028   GFRNONAA 28* 01/16/2013 0535   GFRAA 33* 01/16/2013 0535       Component Value Date/Time   WBC 8.8 01/16/2013 0535   WBC 8.6 01/15/2013 0745   WBC 9.7 01/14/2013 0405   HGB 11.2* 01/16/2013 0535   HGB 11.4* 01/15/2013 0745   HGB 10.9* 01/14/2013 0405   HCT 35.5* 01/16/2013 0535   HCT 35.6* 01/15/2013 0745   HCT 34.1* 01/14/2013 0405   MCV 97.0 01/16/2013 0535   MCV 95.2 01/15/2013 0745   MCV 96.3 01/14/2013 0405    Lipid Panel     Component Value Date/Time   CHOL 132 07/23/2011 0346   TRIG 83 07/23/2011 0346   HDL 49 07/23/2011 0346   CHOLHDL 2.7 07/23/2011 0346   VLDL 17 07/23/2011 0346   LDLCALC 66 07/23/2011 0346    ABG No results found for this basename: phart, pco2, pco2art, po2, po2art, hco3, tco2, acidbasedef, o2sat     Lab Results  Component Value Date   TSH 0.801 08/03/2012   BNP (last 3 results)  Recent Labs  01/05/13 1903  PROBNP 3772.0*   Cardiac Panel (last 3 results) No results found for this basename: CKTOTAL, CKMB, TROPONINI, RELINDX,  in the last 72 hours  Iron/TIBC/Ferritin No results found for this basename: iron, tibc, ferritin      EKG Orders placed during  the hospital encounter of 01/05/13  . EKG 12-LEAD  . EKG 12-LEAD  . ED EKG  . ED EKG  . EKG 12-LEAD  . EKG 12-LEAD  . EKG 12-LEAD  . EKG 12-LEAD  . EKG 12-LEAD  . EKG 12-LEAD  . EKG  . EKG 12-LEAD  . EKG 12-LEAD  . EKG 12-LEAD  . EKG 12-LEAD  . EKG 12-LEAD  . EKG 12-LEAD  . EKG 12-LEAD  . EKG 12-LEAD  . EKG 12-LEAD  . EKG 12-LEAD     Prior Assessment and Plan Problem List as of 08/07/2013     Cardiovascular and Mediastinum   Chronic atrial fibrillation   Last Assessment & Plan   01/23/2013 Office Visit Written 01/23/2013  4:40 PM by Jodelle Gross, NP     Heart rate is currently well-controlled on this visit. She continues on chlorate 0.125 mg twice a day. She remains on Coumadin. No evidence of overt bleeding or complaints somatically.    Nonischemic cardiomyopathy   Chronic systolic heart failure   Last Assessment & Plan   01/23/2013 Office Visit Written 01/23/2013  4:39 PM by Jodelle Gross, NP     Currently well compensated. There is no evidence of fluid overload, dyspnea on exertion, or edema appear she continues medically compliant. Labs will need to be drawn to include a BMET for kidney function. She had labs one week ago just prior to discharge. Sodium was 143 potassium 4.6 chloride 105, creatinine 1.67. She is due to see her primary care physician in 2 weeks. At which time labs were redrawn again we have requested copies. No changes in her medication regimen at this time.    Hypertension   Acute on chronic systolic congestive heart failure   Non-ST elevation myocardial infarction (NSTEMI), initial care episode   Sustained VT (ventricular tachycardia)   Tachy-brady syndrome   Coronary artery dissection   Last Assessment & Plan   01/23/2013 Office Visit Written 01/23/2013  4:44 PM by Jodelle Gross, NP     She is doing well. She has not had to take nitroglycerin, she has had no recurrent chest pain, I have warned her that she is  trying to do more than she needs at this time. She wants to go back to her usual activities but often becomes tired. I have advised her to take her time and to increase her activity slowly. She has not returned to work until after being seen again in one month for ongoing assessment. He works as a [employer] officer, and has been told that she may take as much time as she needs. She will followup with Dr. Beulah Gandy to be est. with him.       Musculoskeletal and Integument   Lumbar disc disease   Rheumatoid arthritis     Genitourinary   Acute on chronic kidney disease, stage 3     Other   Chronic anticoagulation   Last Assessment & Plan   08/09/2011 Office Visit Written 08/09/2011  3:36 PM by Gaylord Shih, MD     Fall risk assessment done and preventative strategies reviewed. Patient advised if she hits her head she must get medical attention immediately.    History of endometrial cancer   Hyperlipidemia   Gout   Cellulitis of right leg       Imaging: Mr Lumbar Spine Wo Contrast  07/09/2013   CLINICAL DATA:  Acute low back pain radiating into the left leg with weakness. No acute  injury. History of back surgery in 2001.  EXAM: MRI LUMBAR SPINE WITHOUT CONTRAST  TECHNIQUE: Multiplanar, multisequence MR imaging was performed. No intravenous contrast was administered.  COMPARISON:  DG LUMBAR SPINE COMPLETE dated 07/21/2011; MR L SPINE WO/W CM dated 06/22/2010  FINDINGS: The alignment is stable with a convex right scoliosis. Chronic L4 compression deformity is unchanged. There has been interval development of an acute/subacute biconcave compression deformity at L3. This fracture results in approximately 35% loss vertebral body height. There is marrow edema throughout the vertebral body but no extension into the posterior elements. There is no osseous retropulsion. No other fractures are identified. The lumbar pedicles are diffusely short on a congenital basis.  The conus medullaris extends to  the L2 level and appears normal. Sacral Tarlov cysts are noted. There are no paraspinal abnormalities.  L1-2: Stable disc bulging eccentric to the left with mild resulting central and left foraminal stenosis.  L2-3: Mildly progressive annular disc bulging eccentric to the left. No residual focal disc protrusion demonstrated. The left lateral recess stenosis has improved. Moderate narrowing of the left foramen appears about the same.  L3-4: Chronic broad-based central disc protrusion is again noted with an asymmetric left foraminal component. This contributes to severe spinal stenosis. There is moderate facet and ligamentous hypertrophy. Moderate left foraminal stenosis has progressed with probable left L3 nerve root encroachment.  L4-5: Stable postsurgical changes following right laminectomy. There is stable annular disc bulging eccentric to the right and mild bilateral facet hypertrophy. The right-greater-than-left foraminal narrowing appears unchanged.  L5-S1: Chronic degenerative disc disease with asymmetric disc bulging and paraspinal osteophytes laterally on the right. Chronic right foraminal stenosis appears unchanged with probable chronic right L5 nerve root encroachment. Both lateral recesses remain mildly narrowed.  IMPRESSION: 1. New acute/subacute biconcave L3 compression deformity. No osseous retropulsion. Old L4 fracture appears unchanged. 2. Left foraminal stenosis at L3-4 appears progressive with probable left L3 nerve root encroachment. This may contribute to the patient's radicular symptoms. 3. The lesser left-sided foraminal narrowing at L1-2 and L2-3 does not appear significantly changed. Likewise, the chronic foraminal narrowing at L4-5 and L5-S1 appears stable.   Electronically Signed   By: Roxy Horseman M.D.   On: 07/09/2013 12:54

## 2013-08-07 NOTE — Assessment & Plan Note (Addendum)
Heart rate is elevated, and she does have some mild symptoms of shortness of breath associated. She states she can feel her heart rate going up and pounding. Other times, she states that she feels her heart rate going too slow.  I will place a cardiac monitor on this patient to evaluate heart rate control. She is essentially staying in the bed, and denies any significant dizziness and when she first chest out of bed. On going to increase her carvedilol to 6.25 mg twice a day on the cardiac monitor. Should the monitor revealed significant bradycardia we will decrease the dose back to 3.125 mg dose twice a day. The patient is symptomatic with a rapid heart rhythm. I am concerned that she may have some evidence of bradycardia although there is no documentation at this time. Review of old records did demonstrate that she has had some tachybradycardia syndrome in the past.    She will continue Coumadin dosing as directed.

## 2013-08-07 NOTE — Telephone Encounter (Signed)
Noted  

## 2013-08-07 NOTE — Progress Notes (Signed)
HPI: [name] is a 80 year old patient of Dr. Beulah Gandy who is medically complex, with known history of non-ST elevation MI, atrial fibrillation with RVR, on chronic Coumadin therapy, chronic systolic heart failure with an EF of 40-45%, with sinus bradycardia, junctional rhythm with pauses. When last seen by Dr. Beulah Gandy, the patient's digoxin and Toprol were discontinued. She is followed in our [place] office for Coumadin dosing. Patient at that time was feeling well without complaints of chest discomfort or dizziness. He was being treated for chronic lower extremity cellulitis of the wound clinic in [place].   The patient stable on last visit. Lasix was reduced to 40 mg twice a day. Her heart rate was well controlled. No documentation per Kerrin Champagne RN with [facility], states that her heart rate was very low. However upon evaluation her heart rate was ranging between 75 and 95 beats per minute.   The patient comes with her daughter, who is her main caretaker. Her heart rate has been elevated, and her daughter states that he has been elevated "for years". The patient is somewhat symptomatic with this concerning breathing status. The patient is bedridden concerning a lumbar fracture and is being followed by Dr. Lonni Fix. The patient is to remain on bedrest until cleared by Dr. Lonni Fix for physical therapy.   The patient is also saying that she is taking her afternoon Lasix, and has become too difficult for her to get in and out of bed in the evening as she does not have a caregiver there at night. She has been offered a CNA for nighttime through [facility], but has refused up until now. Her daughter would like to have this be made available to her.      Allergies  Allergen Reactions  . Codeine Other (See Comments)    Makes patient sleepy.  Marland Kitchen Penicillins Rash    Current Outpatient Prescriptions  Medication Sig Dispense Refill  . atorvastatin (LIPITOR) 40 MG tablet TAKE ONE TABLET BY MOUTH ONCE DAILY.  30  tablet  3  . carvedilol (COREG) 6.25 MG tablet TAKE 1 TABLET BY MOUTH TWICE DAILY WITH MEALS.  60 tablet  2  . Cholecalciferol (VITAMIN D) 2000 UNITS CAPS Take 2,000 Units by mouth daily.      . febuxostat (ULORIC) 40 MG tablet Take 40 mg by mouth daily.      . furosemide (LASIX) 40 MG tablet Take 1 tablet (40 mg total) by mouth daily.  30 tablet  6  . gabapentin (NEURONTIN) 300 MG capsule Take 300 mg by mouth at bedtime.      . hydroxychloroquine (PLAQUENIL) 200 MG tablet Take 1 tablet by mouth Twice daily.      . Hypromellose (GENTEAL OP) Place 1 drop into both eyes daily as needed. Tired Eyes      . losartan (COZAAR) 50 MG tablet TAKE ONE TABLET BY MOUTH ONCE DAILY.  30 tablet  6  . nitroGLYCERIN (NITROSTAT) 0.4 MG SL tablet Place 1 tablet (0.4 mg total) under the tongue every 5 (five) minutes as needed for chest pain.  25 tablet  12  . potassium chloride (K-DUR) 10 MEQ tablet Take 10 mEq by mouth 2 (two) times daily.      . predniSONE (DELTASONE) 5 MG tablet Take 5 mg by mouth daily.      . raloxifene (EVISTA) 60 MG tablet Take 60 mg by mouth daily.      . traMADol (ULTRAM) 50 MG tablet Take 50 mg by mouth.      Marland Kitchen  warfarin (COUMADIN) 5 MG tablet Take 1 tablet daily except 1/2 tablet on Mondays and Thursdays  30 tablet  6   No current facility-administered medications for this visit.    Past Medical History  Diagnosis Date  . Permanent atrial fibrillation     A.  Chronic Coumadin  . MVP (mitral valve prolapse)   . Hypertension   . Nonischemic cardiomyopathy 2001    A.  07/23/11 - Echo: EF 35%;  B. 07/24/2011 - Cath: nonobs dzs ef 35%, 2+MR C. Echo 01/06/13: EF 40-45%, mild LVH, mlid MR, mod biatrial enlargment, mild RV dilatation, mod TR, PASP 54 mmHg  . Systolic CHF, chronic     A. Diag 07/2011, EF 35%  . Lumbar disc disease     laminectomy 2001  . History of endometrial cancer   . Hypertension   . Hyperlipidemia   . Rheumatoid arthritis     treated in past with Remicade and  developed vasculitis  . Gout   . Complication of anesthesia   . PONV (postoperative nausea and vomiting)   . Myocardial infarction 01/06/2013    nestmi  . Shortness of breath   . GERD (gastroesophageal reflux disease)   . Coronary artery dissection 6-01/2013    95% OM3 hazy lesion on cath, medically managed    Past Surgical History  Procedure Laterality Date  . Appendectomy    . Abdominal hysterectomy  2000    Stage 1 endometrial cancer  . Hernia repair  09/2008  . Hernia repair  04/17/11    lap ventral incisional hernia repair with mesh   . Gastric biopsy      benign  . Excision of lipoma    . Lumbar laminectomy  2001  . Hammer toe surgery    . Cardiac catheterization  01/08/13    OM3 95% lesion felt to represent coronary dissection, mild luminal irregularities elsewhere; EF 40-45%; medically managed    ROS: Review of systems complete and found to be negative unless listed above  PHYSICAL EXAM BP 128/33  Pulse 117  Ht 5\' 5"  (1.651 m)  Wt 179 lb (81.194 kg)  BMI 29.79 kg/m2  SpO2 100%  General: Well developed, well nourished, in no acute distress Head: Eyes PERRLA, No xanthomas.   Normal cephalic and atramatic  Lungs: Clear bilaterally to auscultation mild crackles in the bases. Heart: HIRR S1 S2, tachycardic without MRG.  Pulses are 2+ & equal.            No carotid bruit. No JVD.  No abdominal bruits. No femoral bruits. Abdomen: Bowel sounds are positive, abdomen soft and non-tender without masses or                  Hernia's noted. Msk:  Back normal, normal gait. Normal strength and tone for age. Extremities: No clubbing, cyanosis or edema.  DP +1 Neuro: Alert and oriented X 3. Psych:  Good affect, responds appropriately  EKG: Atrial fibrillation with a heart rate of 117 beats per minute. Evidence of anterior infarct.  ASSESSMENT AND PLAN

## 2013-08-07 NOTE — Assessment & Plan Note (Signed)
The patient has no evidence of fluid overload. She is not taking her Lasix in the afternoon. We will go ahead and discontinue that. She will remain on Lasix 40 mg daily. Hold a BMET drawn to evaluate her kidney function, which can be drawn per home health evaluation through [facility].

## 2013-08-08 NOTE — Telephone Encounter (Signed)
Noted pt was evaluated in office yesterday 08-07-13 by Lorin Picket NP

## 2013-08-11 NOTE — Addendum Note (Signed)
Addended by: Thompson Grayer on: 08/11/2013 10:51 AM   Modules accepted: Orders

## 2013-08-11 NOTE — Addendum Note (Signed)
Addended by: Thompson Grayer on: 08/11/2013 10:02 AM   Modules accepted: Orders

## 2013-08-12 ENCOUNTER — Other Ambulatory Visit: Payer: Self-pay | Admitting: *Deleted

## 2013-08-12 DIAGNOSIS — Z7901 Long term (current) use of anticoagulants: Secondary | ICD-10-CM

## 2013-08-12 DIAGNOSIS — I4891 Unspecified atrial fibrillation: Secondary | ICD-10-CM

## 2013-08-21 ENCOUNTER — Encounter (HOSPITAL_COMMUNITY): Payer: Self-pay | Admitting: Emergency Medicine

## 2013-08-21 ENCOUNTER — Emergency Department (HOSPITAL_COMMUNITY): Payer: Medicare Other

## 2013-08-21 ENCOUNTER — Inpatient Hospital Stay (HOSPITAL_COMMUNITY)
Admission: EM | Admit: 2013-08-21 | Discharge: 2013-08-26 | DRG: 292 | Disposition: A | Discharge status: Skilled Nursing Facility | Payer: Medicare Other | Attending: Internal Medicine | Admitting: Internal Medicine

## 2013-08-21 DIAGNOSIS — I129 Hypertensive chronic kidney disease with stage 1 through stage 4 chronic kidney disease, or unspecified chronic kidney disease: Secondary | ICD-10-CM | POA: Diagnosis present

## 2013-08-21 DIAGNOSIS — I10 Essential (primary) hypertension: Secondary | ICD-10-CM

## 2013-08-21 DIAGNOSIS — I4729 Other ventricular tachycardia: Secondary | ICD-10-CM

## 2013-08-21 DIAGNOSIS — I509 Heart failure, unspecified: Secondary | ICD-10-CM

## 2013-08-21 DIAGNOSIS — Z79899 Other long term (current) drug therapy: Secondary | ICD-10-CM

## 2013-08-21 DIAGNOSIS — Z7401 Bed confinement status: Secondary | ICD-10-CM

## 2013-08-21 DIAGNOSIS — Z8542 Personal history of malignant neoplasm of other parts of uterus: Secondary | ICD-10-CM

## 2013-08-21 DIAGNOSIS — I252 Old myocardial infarction: Secondary | ICD-10-CM

## 2013-08-21 DIAGNOSIS — I5189 Other ill-defined heart diseases: Secondary | ICD-10-CM

## 2013-08-21 DIAGNOSIS — N189 Chronic kidney disease, unspecified: Secondary | ICD-10-CM | POA: Diagnosis present

## 2013-08-21 DIAGNOSIS — I519 Heart disease, unspecified: Secondary | ICD-10-CM

## 2013-08-21 DIAGNOSIS — I428 Other cardiomyopathies: Secondary | ICD-10-CM

## 2013-08-21 DIAGNOSIS — I5022 Chronic systolic (congestive) heart failure: Secondary | ICD-10-CM | POA: Diagnosis present

## 2013-08-21 DIAGNOSIS — I4891 Unspecified atrial fibrillation: Secondary | ICD-10-CM

## 2013-08-21 DIAGNOSIS — I5023 Acute on chronic systolic (congestive) heart failure: Principal | ICD-10-CM

## 2013-08-21 DIAGNOSIS — E785 Hyperlipidemia, unspecified: Secondary | ICD-10-CM

## 2013-08-21 DIAGNOSIS — N289 Disorder of kidney and ureter, unspecified: Secondary | ICD-10-CM

## 2013-08-21 DIAGNOSIS — N183 Chronic kidney disease, stage 3 unspecified: Secondary | ICD-10-CM

## 2013-08-21 DIAGNOSIS — M069 Rheumatoid arthritis, unspecified: Secondary | ICD-10-CM | POA: Diagnosis present

## 2013-08-21 DIAGNOSIS — K219 Gastro-esophageal reflux disease without esophagitis: Secondary | ICD-10-CM | POA: Diagnosis present

## 2013-08-21 DIAGNOSIS — M519 Unspecified thoracic, thoracolumbar and lumbosacral intervertebral disc disorder: Secondary | ICD-10-CM

## 2013-08-21 DIAGNOSIS — I2510 Atherosclerotic heart disease of native coronary artery without angina pectoris: Secondary | ICD-10-CM

## 2013-08-21 DIAGNOSIS — Z8679 Personal history of other diseases of the circulatory system: Secondary | ICD-10-CM

## 2013-08-21 DIAGNOSIS — Z5181 Encounter for therapeutic drug level monitoring: Secondary | ICD-10-CM

## 2013-08-21 DIAGNOSIS — IMO0002 Reserved for concepts with insufficient information to code with codable children: Secondary | ICD-10-CM

## 2013-08-21 DIAGNOSIS — M109 Gout, unspecified: Secondary | ICD-10-CM

## 2013-08-21 DIAGNOSIS — I472 Ventricular tachycardia: Secondary | ICD-10-CM

## 2013-08-21 DIAGNOSIS — Z7901 Long term (current) use of anticoagulants: Secondary | ICD-10-CM

## 2013-08-21 DIAGNOSIS — M810 Age-related osteoporosis without current pathological fracture: Secondary | ICD-10-CM | POA: Diagnosis present

## 2013-08-21 DIAGNOSIS — I495 Sick sinus syndrome: Secondary | ICD-10-CM

## 2013-08-21 HISTORY — DX: Essential (primary) hypertension: I10

## 2013-08-21 HISTORY — DX: Non-ST elevation (NSTEMI) myocardial infarction: I21.4

## 2013-08-21 LAB — CBC WITH DIFFERENTIAL/PLATELET
BASOS PCT: 1 % (ref 0–1)
Basophils Absolute: 0 10*3/uL (ref 0.0–0.1)
EOS ABS: 0.2 10*3/uL (ref 0.0–0.7)
EOS PCT: 3 % (ref 0–5)
HCT: 39.2 % (ref 36.0–46.0)
Hemoglobin: 12.5 g/dL (ref 12.0–15.0)
Lymphocytes Relative: 17 % (ref 12–46)
Lymphs Abs: 1.4 10*3/uL (ref 0.7–4.0)
MCH: 29.1 pg (ref 26.0–34.0)
MCHC: 31.9 g/dL (ref 30.0–36.0)
MCV: 91.2 fL (ref 78.0–100.0)
Monocytes Absolute: 0.8 10*3/uL (ref 0.1–1.0)
Monocytes Relative: 9 % (ref 3–12)
NEUTROS PCT: 71 % (ref 43–77)
Neutro Abs: 6 10*3/uL (ref 1.7–7.7)
PLATELETS: 174 10*3/uL (ref 150–400)
RBC: 4.3 MIL/uL (ref 3.87–5.11)
RDW: 17.2 % — AB (ref 11.5–15.5)
WBC: 8.5 10*3/uL (ref 4.0–10.5)

## 2013-08-21 LAB — APTT: aPTT: 46 seconds — ABNORMAL HIGH (ref 24–37)

## 2013-08-21 LAB — BASIC METABOLIC PANEL
BUN: 42 mg/dL — AB (ref 6–23)
CALCIUM: 10 mg/dL (ref 8.4–10.5)
CO2: 29 meq/L (ref 19–32)
CREATININE: 2.19 mg/dL — AB (ref 0.50–1.10)
Chloride: 104 mEq/L (ref 96–112)
GFR calc Af Amer: 24 mL/min — ABNORMAL LOW (ref >90)
GFR, EST NON AFRICAN AMERICAN: 20 mL/min — AB (ref >90)
GLUCOSE: 88 mg/dL (ref 70–99)
Potassium: 4.1 mEq/L (ref 3.7–5.3)
Sodium: 146 mEq/L (ref 137–147)

## 2013-08-21 LAB — PROTIME-INR
INR: 3.17 — ABNORMAL HIGH (ref 0.00–1.49)
Prothrombin Time: 31.4 seconds — ABNORMAL HIGH (ref 11.6–15.2)

## 2013-08-21 LAB — TROPONIN I

## 2013-08-21 LAB — MRSA PCR SCREENING: MRSA by PCR: NEGATIVE

## 2013-08-21 LAB — PRO B NATRIURETIC PEPTIDE: PRO B NATRI PEPTIDE: 8457 pg/mL — AB (ref 0–450)

## 2013-08-21 MED ORDER — HYDROXYCHLOROQUINE SULFATE 200 MG PO TABS
200.0000 mg | ORAL_TABLET | Freq: Two times a day (BID) | ORAL | Status: DC
  Administered 2013-08-21 – 2013-08-26 (×10): 200 mg via ORAL
  Filled 2013-08-21 (×12): qty 1

## 2013-08-21 MED ORDER — DILTIAZEM LOAD VIA INFUSION
10.0000 mg | Freq: Once | INTRAVENOUS | Status: AC
  Administered 2013-08-21: 10 mg via INTRAVENOUS
  Filled 2013-08-21: qty 10

## 2013-08-21 MED ORDER — SODIUM CHLORIDE 0.9 % IJ SOLN
3.0000 mL | Freq: Two times a day (BID) | INTRAMUSCULAR | Status: DC
  Administered 2013-08-21 – 2013-08-26 (×7): 3 mL via INTRAVENOUS

## 2013-08-21 MED ORDER — ALBUTEROL SULFATE (2.5 MG/3ML) 0.083% IN NEBU: 2.5000 mg | INHALATION_SOLUTION | Freq: Once | RESPIRATORY_TRACT | Status: DC

## 2013-08-21 MED ORDER — PREDNISONE 10 MG PO TABS
5.0000 mg | ORAL_TABLET | Freq: Every day | ORAL | Status: DC
  Administered 2013-08-22 – 2013-08-26 (×5): 5 mg via ORAL
  Filled 2013-08-21 (×5): qty 1

## 2013-08-21 MED ORDER — GABAPENTIN 300 MG PO CAPS
300.0000 mg | ORAL_CAPSULE | Freq: Every day | ORAL | Status: DC
  Administered 2013-08-21 – 2013-08-25 (×5): 300 mg via ORAL
  Filled 2013-08-21 (×5): qty 1

## 2013-08-21 MED ORDER — IPRATROPIUM-ALBUTEROL 0.5-2.5 (3) MG/3ML IN SOLN: 3.0000 mL | Freq: Once | RESPIRATORY_TRACT | Status: DC

## 2013-08-21 MED ORDER — NITROGLYCERIN 0.4 MG SL SUBL: 0.4000 mg | SUBLINGUAL_TABLET | SUBLINGUAL | Status: DC | PRN

## 2013-08-21 MED ORDER — RALOXIFENE HCL 60 MG PO TABS
60.0000 mg | ORAL_TABLET | Freq: Every day | ORAL | Status: DC
  Administered 2013-08-22 – 2013-08-26 (×5): 60 mg via ORAL
  Filled 2013-08-21 (×5): qty 1

## 2013-08-21 MED ORDER — BIOTENE DRY MOUTH MT LIQD
15.0000 mL | Freq: Two times a day (BID) | OROMUCOSAL | Status: DC
  Administered 2013-08-21 – 2013-08-26 (×9): 15 mL via OROMUCOSAL

## 2013-08-21 MED ORDER — TRAMADOL HCL 50 MG PO TABS
50.0000 mg | ORAL_TABLET | Freq: Four times a day (QID) | ORAL | Status: DC | PRN
  Administered 2013-08-21 – 2013-08-26 (×10): 50 mg via ORAL
  Filled 2013-08-21 (×10): qty 1

## 2013-08-21 MED ORDER — DILTIAZEM HCL 100 MG IV SOLR
5.0000 mg/h | INTRAVENOUS | Status: DC
  Administered 2013-08-21 (×2): 5 mg/h via INTRAVENOUS
  Filled 2013-08-21: qty 100

## 2013-08-21 MED ORDER — ATORVASTATIN CALCIUM 40 MG PO TABS
40.0000 mg | ORAL_TABLET | Freq: Every day | ORAL | Status: DC
  Administered 2013-08-22 – 2013-08-25 (×4): 40 mg via ORAL
  Filled 2013-08-21 (×4): qty 1

## 2013-08-21 MED ORDER — SODIUM CHLORIDE 0.9 % IV SOLN
INTRAVENOUS | Status: DC
  Administered 2013-08-21 (×2): via INTRAVENOUS

## 2013-08-21 MED ORDER — SODIUM CHLORIDE 0.9 % IV SOLN: 250.0000 mL | INTRAVENOUS | Status: DC | PRN

## 2013-08-21 MED ORDER — SODIUM CHLORIDE 0.9 % IJ SOLN: 3.0000 mL | INTRAMUSCULAR | Status: DC | PRN

## 2013-08-21 MED ORDER — WARFARIN - PHARMACIST DOSING INPATIENT: Status: DC

## 2013-08-21 MED ORDER — CARVEDILOL 3.125 MG PO TABS
6.2500 mg | ORAL_TABLET | Freq: Two times a day (BID) | ORAL | Status: DC
  Administered 2013-08-21 – 2013-08-26 (×9): 6.25 mg via ORAL
  Filled 2013-08-21 (×10): qty 2

## 2013-08-21 MED ORDER — FUROSEMIDE 10 MG/ML IJ SOLN
40.0000 mg | Freq: Once | INTRAMUSCULAR | Status: AC
  Administered 2013-08-21: 40 mg via INTRAVENOUS
  Filled 2013-08-21: qty 4

## 2013-08-21 MED ORDER — POTASSIUM CHLORIDE CRYS ER 10 MEQ PO TBCR
10.0000 meq | EXTENDED_RELEASE_TABLET | Freq: Two times a day (BID) | ORAL | Status: DC
  Administered 2013-08-21 – 2013-08-23 (×4): 10 meq via ORAL
  Filled 2013-08-21 (×4): qty 1

## 2013-08-21 MED ORDER — FEBUXOSTAT 40 MG PO TABS
40.0000 mg | ORAL_TABLET | Freq: Every day | ORAL | Status: DC
  Administered 2013-08-22 – 2013-08-26 (×5): 40 mg via ORAL
  Filled 2013-08-21 (×7): qty 1

## 2013-08-21 MED ORDER — MORPHINE SULFATE 2 MG/ML IJ SOLN: 2.0000 mg | INTRAMUSCULAR | Status: DC | PRN

## 2013-08-21 MED ORDER — VITAMIN D 1000 UNITS PO TABS
2000.0000 [IU] | ORAL_TABLET | Freq: Every day | ORAL | Status: DC
  Administered 2013-08-22 – 2013-08-26 (×5): 2000 [IU] via ORAL
  Filled 2013-08-21 (×5): qty 2

## 2013-08-21 MED ORDER — FUROSEMIDE 10 MG/ML IJ SOLN
40.0000 mg | Freq: Every day | INTRAMUSCULAR | Status: DC
  Administered 2013-08-21 – 2013-08-25 (×5): 40 mg via INTRAVENOUS
  Filled 2013-08-21 (×5): qty 4

## 2013-08-21 NOTE — ED Provider Notes (Signed)
CSN: [id number]     Arrival date & time 08/21/13  1146 History   First MD Initiated Contact with Patient 08/21/13 1232     Chief Complaint  Patient presents with  . Shortness of Breath      HPI Pt was seen at 1245.  Per pt and her family, c/o gradual onset and worsening of persistent SOB and peripheral edema for the past 1 to 2 weeks, worse over the past 2 days.  Describes her symptoms as "it might by my CHF."  States yesterday she developed lower mid-sternal/upper abd discomfort.  Denies CP/palpitations, no back pain, no N/V/D, no fevers, no rash.    Past Medical History  Diagnosis Date  . Permanent atrial fibrillation     A.  Chronic Coumadin  . MVP (mitral valve prolapse)   . Hypertension   . Nonischemic cardiomyopathy 2001    A.  07/23/11 - Echo: EF 35%;  B. 07/24/2011 - Cath: nonobs dzs ef 35%, 2+MR C. Echo 01/06/13: EF 40-45%, mild LVH, mlid MR, mod biatrial enlargment, mild RV dilatation, mod TR, PASP 54 mmHg  . Systolic CHF, chronic     A. Diag 07/2011, EF 35%  . Lumbar disc disease     laminectomy 2001  . History of endometrial cancer   . Hypertension   . Hyperlipidemia   . Rheumatoid arthritis     treated in past with Remicade and developed vasculitis  . Gout   . Complication of anesthesia   . PONV (postoperative nausea and vomiting)   . Myocardial infarction 01/06/2013    nestmi  . Shortness of breath   . GERD (gastroesophageal reflux disease)   . Coronary artery dissection 6-01/2013    95% OM3 hazy lesion on cath, medically managed   Past Surgical History  Procedure Laterality Date  . Appendectomy    . Abdominal hysterectomy  2000    Stage 1 endometrial cancer  . Hernia repair  09/2008  . Hernia repair  04/17/11    lap ventral incisional hernia repair with mesh   . Gastric biopsy      benign  . Excision of lipoma    . Lumbar laminectomy  2001  . Hammer toe surgery    . Cardiac catheterization  01/08/13    OM3 95% lesion felt to represent coronary dissection,  mild luminal irregularities elsewhere; EF 40-45%; medically managed    History  Substance Use Topics  . Smoking status: Never Smoker   . Smokeless tobacco: Never Used  . Alcohol Use: Yes     Comment: rare    Review of Systems ROS: Statement: All systems negative except as marked or noted in the HPI; Constitutional: Negative for fever and chills. ; ; Eyes: Negative for eye pain, redness and discharge. ; ; ENMT: Negative for ear pain, hoarseness, nasal congestion, sinus pressure and sore throat. ; ; Cardiovascular: +CP, SOB, peripheral edema. Negative for palpitations, diaphoresis. ; ; Respiratory: Negative for cough, wheezing and stridor. ; ; Gastrointestinal: Negative for nausea, vomiting, diarrhea, abdominal pain, blood in stool, hematemesis, jaundice and rectal bleeding. . ; ; Genitourinary: Negative for dysuria, flank pain and hematuria. ; ; Musculoskeletal: Negative for back pain and neck pain. Negative for swelling and trauma.; ; Skin: Negative for pruritus, rash, abrasions, blisters, bruising and skin lesion.; ; Neuro: Negative for headache, lightheadedness and neck stiffness. Negative for weakness, altered level of consciousness , altered mental status, extremity weakness, paresthesias, involuntary movement, seizure and syncope.     Allergies  Codeine and Penicillins  Home Medications   Current Outpatient Rx  Name  Route  Sig  Dispense  Refill  . atorvastatin (LIPITOR) 40 MG tablet      TAKE ONE TABLET BY MOUTH ONCE DAILY.   30 tablet   3   . carvedilol (COREG) 6.25 MG tablet      TAKE 1 TABLET BY MOUTH TWICE DAILY WITH MEALS.   60 tablet   2   . Cholecalciferol (VITAMIN D) 2000 UNITS CAPS   Oral   Take 2,000 Units by mouth daily.         . febuxostat (ULORIC) 40 MG tablet   Oral   Take 40 mg by mouth daily.         . furosemide (LASIX) 40 MG tablet   Oral   Take 1 tablet (40 mg total) by mouth daily.   30 tablet   6   . gabapentin (NEURONTIN) 300 MG  capsule   Oral   Take 300 mg by mouth at bedtime.         . hydroxychloroquine (PLAQUENIL) 200 MG tablet   Oral   Take 1 tablet by mouth Twice daily.         . Hypromellose (GENTEAL OP)   Both Eyes   Place 1 drop into both eyes daily as needed. Tired Eyes         . losartan (COZAAR) 50 MG tablet      TAKE ONE TABLET BY MOUTH ONCE DAILY.   30 tablet   6   . nitroGLYCERIN (NITROSTAT) 0.4 MG SL tablet   Sublingual   Place 1 tablet (0.4 mg total) under the tongue every 5 (five) minutes as needed for chest pain.   25 tablet   12   . potassium chloride (K-DUR) 10 MEQ tablet   Oral   Take 10 mEq by mouth 2 (two) times daily.         . predniSONE (DELTASONE) 5 MG tablet   Oral   Take 5 mg by mouth daily.         . raloxifene (EVISTA) 60 MG tablet   Oral   Take 60 mg by mouth daily.         . traMADol (ULTRAM) 50 MG tablet   Oral   Take 50 mg by mouth daily as needed.          . warfarin (COUMADIN) 5 MG tablet      Take 1 tablet daily except 1/2 tablet on Mondays and Thursdays   30 tablet   6    BP 113/75  Pulse 100  Temp(Src) 98.3 F (36.8 C) (Oral)  Resp 22  Ht 5\' 5"  (1.651 m)  Wt 178 lb (80.74 kg)  BMI 29.62 kg/m2  SpO2 99% Physical Exam 1250: Physical examination:  Nursing notes reviewed; Vital signs and O2 SAT reviewed;  Constitutional: Well developed, Well nourished, Well hydrated, In no acute distress; Head:  Normocephalic, atraumatic; Eyes: EOMI, PERRL, No scleral icterus; ENMT: Mouth and pharynx normal, Mucous membranes moist; Neck: Supple, Full range of motion, No lymphadenopathy; Cardiovascular: Tachycardic rate rate and irregular irregular rhythm, No gallop; Respiratory: Breath sounds coarse & equal bilaterally, No wheezes.  Speaking full sentences with ease, Normal respiratory effort/excursion; Chest: Nontender, Movement normal; Abdomen: Soft, Nontender, Nondistended, Normal bowel sounds; Genitourinary: No CVA tenderness; Extremities: Pulses  normal, No tenderness, +2 pedal edema bilat without calf asymmetry.; Neuro: AA&Ox3, Major CN grossly intact.  Speech clear. No gross focal  motor or sensory deficits in extremities.; Skin: Color normal, Warm, Dry.   ED Course  Procedures    EKG Interpretation    Date/Time:  Thursday August 21 2013 12:01:54 EST Ventricular Rate:  124 PR Interval:    QRS Duration: 104 QT Interval:  352 QTC Calculation: 505 R Axis:   167 Text Interpretation:  Atrial fibrillation with rapid ventricular response Right axis deviation Low voltage QRS Cannot rule out Anterior infarct , age undetermined Abnormal ECG When compared with ECG of 16-Jan-2013 09:21, Atrial fibrillation Rate faster Confirmed by [facility]  MD, Nicholos Johns [phone]) on 08/21/2013 3:41:13 PM            MDM  MDM Reviewed: previous chart, nursing note and vitals Reviewed previous: labs and ECG Interpretation: labs, ECG and x-ray Total time providing critical care: 30-74 minutes. This excludes time spent performing separately reportable procedures and services. Consults: admitting MD   CRITICAL CARE Performed by: Laray Anger Total critical care time: 35 Critical care time was exclusive of separately billable procedures and treating other patients. Critical care was necessary to treat or prevent imminent or life-threatening deterioration. Critical care was time spent personally by me on the following activities: development of treatment plan with patient and/or surrogate as well as nursing, discussions with consultants, evaluation of patient's response to treatment, examination of patient, obtaining history from patient or surrogate, ordering and performing treatments and interventions, ordering and review of laboratory studies, ordering and review of radiographic studies, pulse oximetry and re-evaluation of patient's condition.   Results for orders placed during the hospital encounter of 08/21/13  BASIC METABOLIC PANEL       Result Value Ref Range   Sodium 146  137 - 147 mEq/L   Potassium 4.1  3.7 - 5.3 mEq/L   Chloride 104  96 - 112 mEq/L   CO2 29  19 - 32 mEq/L   Glucose, Bld 88  70 - 99 mg/dL   BUN 42 (*) 6 - 23 mg/dL   Creatinine, Ser 2.42 (*) 0.50 - 1.10 mg/dL   Calcium 35.3  8.4 - 61.4 mg/dL   GFR calc non Af Amer 20 (*) >90 mL/min   GFR calc Af Amer 24 (*) >90 mL/min  CBC WITH DIFFERENTIAL      Result Value Ref Range   WBC 8.5  4.0 - 10.5 K/uL   RBC 4.30  3.87 - 5.11 MIL/uL   Hemoglobin 12.5  12.0 - 15.0 g/dL   HCT 43.1  54.0 - 08.6 %   MCV 91.2  78.0 - 100.0 fL   MCH 29.1  26.0 - 34.0 pg   MCHC 31.9  30.0 - 36.0 g/dL   RDW 76.1 (*) 95.0 - 93.2 %   Platelets 174  150 - 400 K/uL   Neutrophils Relative % 71  43 - 77 %   Neutro Abs 6.0  1.7 - 7.7 K/uL   Lymphocytes Relative 17  12 - 46 %   Lymphs Abs 1.4  0.7 - 4.0 K/uL   Monocytes Relative 9  3 - 12 %   Monocytes Absolute 0.8  0.1 - 1.0 K/uL   Eosinophils Relative 3  0 - 5 %   Eosinophils Absolute 0.2  0.0 - 0.7 K/uL   Basophils Relative 1  0 - 1 %   Basophils Absolute 0.0  0.0 - 0.1 K/uL  PROTIME-INR      Result Value Ref Range   Prothrombin Time 31.4 (*) 11.6 - 15.2 seconds   INR  3.17 (*) 0.00 - 1.49  APTT      Result Value Ref Range   aPTT 46 (*) 24 - 37 seconds  TROPONIN I      Result Value Ref Range   Troponin I <0.30  <0.30 ng/mL  PRO B NATRIURETIC PEPTIDE      Result Value Ref Range   Pro B Natriuretic peptide (BNP) 8457.0 (*) 0 - 450 pg/mL   Dg Chest Portable 1 View 08/21/2013   CLINICAL DATA:  Shortness of breath for 3 days, history atrial fibrillation, hypertension, CHF, prior MI, endometrial cancer, GERD  EXAM: PORTABLE CHEST - 1 VIEW  COMPARISON:  Portable exam 1207 hr compared to 01/07/2013  FINDINGS: Enlargement of cardiac silhouette with pulmonary vascular congestion.  Atherosclerotic calcification of a tortuous thoracic aorta.  Chronic elevation of right diaphragm with minimal right basilar atelectasis.  Central  bronchitic changes.  No definite infiltrate, pleural effusion or pneumothorax.  IMPRESSION: Enlargement of cardiac silhouette with pulmonary vascular congestion.  Bronchitic changes with minimal right base atelectasis.   Electronically Signed   By: Ulyses Southward M.D.   On: 08/21/2013 12:18    1450:   Afib with RVR on arrival, rate 130-140's. IV cardizem bolus and gtt dosed with afib rate improving to 90-100's. BNP elevated from previous with pulmonary vascular congestion on CXR; IV lasix given. Dx and testing d/w pt and family.  Questions answered.  Verb understanding, agreeable to admit.  T/C to [facility] Dr. Catha Gosselin, case discussed, including:  HPI, pertinent PM/SHx, VS/PE, dx testing, ED course and treatment:  Agreeable to admit, requests to write temporary orders, obtain stepdown bed to team 1.   Laray Anger, DO 08/23/13 1236

## 2013-08-21 NOTE — Consult Note (Signed)
Primary cardiologist: Dr. Prentice Docker Consulting cardiologist: Dr. Jonelle Sidle  Clinical Summary Marissa [name] is a medically complex 80 y.o.female patient of Dr. Purvis Sheffield, recently seen in the office by Ms. Lawrence NP on January 29. Cardiac history is outlined below. At the patient's most recent visit, she was complaining of mild shortness of breath, and it was noted that her heart rate was elevated, ECG showing atrial fibrillation at 117. Coreg was increased to 6.25 mg twice daily and she was to wear a cardiac monitor with prior documentation of tachycardia-bradycardia syndrome. Lasix was continued at 40 mg daily. Weight 179 pounds at that time, had previously been 176 pounds as of August 2014. She had been on higher dose of Lasix, but this was cut back since she has been essentially bedridden with a lumbar fracture that is being managed by Dr. Newell Coral.  She is here with her daughter, presenting mainly with complaints of shortness of breath over baseline, also a feeling of increased abdominal girth and leg swelling. She mentions that heart rate control has been better on higher dose Coreg, although her heart rate was significantly elevated when she presented to the ER.  Echocardiogram from June 2014 demonstrated mild LVH with LVEF 40-45%, anteroseptal hypokinesis, mild mitral regurgitation, moderately dilated left atrium, mildly dilated right ventricle, moderately to severely dilated right atrium, moderate tricuspid regurgitation with PASP 54 mm mercury.  She reports compliance with her Lasix. Pro-BNP is 8457 with initial troponin I negative. Creatinine is elevated at 2.1 which is up from 1.3 back in November 2014.   Allergies  Allergen Reactions  . Codeine Other (See Comments)    Makes patient sleepy.  Marland Kitchen Penicillins Rash    Home Medications Coumadin as directed by Coumadin clinic Ultram 50 mg when necessary Evista 60 mg by mouth daily Prednisone 5 mg by mouth  daily Potassium chloride 10 mEq by mouth twice daily Nitroglycerin 0.4 mg sublingual as needed Losartan 50 mg by mouth once daily Hypromellose eyedrops Plaquenil 200 mg by mouth twice daily Neurontin 300 mg by mouth at bedtime Lasix 40 mg by mouth daily Uloric 40 mg by mouth daily Vitamin D 2000 units by mouth daily Coreg 6.25 mg twice daily Lipitor 40 mg by mouth daily   Past Medical History  Diagnosis Date  . Permanent atrial fibrillation     A.  Chronic Coumadin  . MVP (mitral valve prolapse)   . Essential hypertension, benign   . Nonischemic cardiomyopathy 2001    A.  07/23/11 - Echo: EF 35%;  B. 07/24/2011 - Cath: nonobs dzs ef 35%, 2+MR C. Echo 01/06/13: EF 40-45%, mild LVH, mlid MR, mod biatrial enlargment, mild RV dilatation, mod TR, PASP 54 mmHg  . Systolic CHF, chronic     A. Diag 07/2011, EF 35%  . Lumbar disc disease     Laminectomy 2001  . History of endometrial cancer   . Hyperlipidemia   . Rheumatoid arthritis     Treated in past with Remicade and developed vasculitis  . Gout   . PONV (postoperative nausea and vomiting)   . NSTEMI (non-ST elevated myocardial infarction) 12/2012  . GERD (gastroesophageal reflux disease)   . Coronary artery dissection 12/2012    95% OM3 hazy lesion on cath, medically managed    Past Surgical History  Procedure Laterality Date  . Appendectomy    . Abdominal hysterectomy  2000    Stage 1 endometrial cancer  . Hernia repair  09/2008  . Hernia repair  04/17/11    Lap ventral incisional hernia repair with mesh   . Gastric biopsy      Benign  . Excision of lipoma    . Lumbar laminectomy  2001  . Hammer toe surgery    . Cardiac catheterization  01/08/13    OM3 95% lesion felt to represent coronary dissection, mild luminal irregularities elsewhere; EF 40-45%; medically managed    Family History  Problem Relation Age of Onset  . Cancer Father     Social History [name] reports that she has never smoked. She has never used  smokeless tobacco. Ms. Guilfoil reports that she drinks alcohol.  Review of Systems No fevers or chills. Stable appetite. Chronic back pain. Has not been ambulating. Otherwise as outlined.  Physical Examination Blood pressure 112/68, pulse 100, temperature 98.3 F (36.8 C), temperature source Oral, resp. rate 17, height 5\' 5"  (1.651 m), weight 178 lb (80.74 kg), SpO2 99.00%. No intake or output data in the 24 hours ending 08/21/13 1609  Patient in no acute distress. HEENT: Conjunctiva and lids normal, oropharynx clear. Neck: Supple, elevated JVP, no carotid bruits, no thyromegaly. Lungs: Diminished breath sounds but nonlabored. Cardiac: Distant, irregularly irregular, no S3 or significant systolic murmur, no pericardial rub. Abdomen: Soft, nontender, bowel sounds present. Extremities: 1-2+ ankle edema, distal pulses 2+. Skin: Warm and dry. Musculoskeletal: No kyphosis. Neuropsychiatric: Alert and oriented x3, affect grossly appropriate.   Lab Results  Basic Metabolic Panel:  Recent Labs Lab 08/21/13 1231  NA 146  K 4.1  CL 104  CO2 29  GLUCOSE 88  BUN 42*  CREATININE 2.19*  CALCIUM 10.0    CBC:  Recent Labs Lab 08/21/13 1231  WBC 8.5  NEUTROABS 6.0  HGB 12.5  HCT 39.2  MCV 91.2  PLT 174    Cardiac Enzymes:  Recent Labs Lab 08/21/13 1220  TROPONINI <0.30    ECG Atrial fibrillation with rapid ventricular response, rightward axis, poor R-wave progression anteriorly.  Imaging PORTABLE CHEST - 1 VIEW  COMPARISON: Portable exam 1207 hr compared to 01/07/2013  FINDINGS: Enlargement of cardiac silhouette with pulmonary vascular congestion.  Atherosclerotic calcification of a tortuous thoracic aorta.  Chronic elevation of right diaphragm with minimal right basilar atelectasis.  Central bronchitic changes.  No definite infiltrate, pleural effusion or pneumothorax.  IMPRESSION: Enlargement of cardiac silhouette with pulmonary  vascular congestion.  Bronchitic changes with minimal right base atelectasis.   Impression  1. Permanent atrial fibrillation, now with rapid ventricular rates. Patient reports compliance with Coreg at 6.25 mg twice daily. She is on Coumadin with INR 3.1.  2. Prior documentation of tachycardia-bradycardia syndrome, was taken off Toprol-XL and digoxin related to this in the past, had greater than 3 second pauses at one point. She seems to be tolerating Coreg, and was actually to wear a cardiac monitor following increase in dose recently.  3. Nonischemic cardiomyopathy, LVEF 40-45% as of echocardiogram June 2014. Looks to have associated component of acute on chronic combined heart failure.  4. CAD, history of obtuse marginal disease documented in setting of NSTEMI back in the summer of  2014, managed medically without PCI.  5. History of osteoporosis and previous laminectomy, reportedly with active lumbar fracture that is being followed conservatively. Patient is functionally limited, essentially at bed rest over the last 6 weeks. She is followed by Dr. 2015.  6. Acute on chronic renal insufficiency, creatinine 2.1 up from 1.3.   Recommendations  Patient being admitted to the hospitalist team for further  management. At this point doubt ACS, but patient does need better control of her atrial fibrillation, and diuresis for improvement in volume status. She is on intravenous Cardizem at low dose, would continue Coreg, and likely try and simplify to a single medication regimen. Follow heart rate closely with prior history of tachycardia-bradycardia syndrome. She is already anticoagulated with Coumadin. Followup echocardiogram has been ordered for reassessment of LV function and valvular status.   Jonelle Sidle, M.D., F.A.C.C.

## 2013-08-21 NOTE — H&P (Signed)
[facility] History and Physical  DEMIYAH FISCHBACH AGT:[id number] DOB: 25-May-1933 DOA: 08/21/2013  Referring physician:  PCP: Carylon Perches, MD  Specialists:   Chief Complaint: Shortness of breath  HPI: Marissa [name] is a 80 y.o. female  With a history of systolic CHF, EF 22-48%, hypertension, rheumatoid arthritis, atrial fibrillation on anticoagulation, that presents emergency department with complaints of shortness of breath. Patient states that she has been having to use extra pillows to lay on it night due to her shortness of breath. She has been more bedbound due to an L5 fracture. Patient states that she recently saw her cardiologist a few weeks ago, her medications were "tweeked".  She states that her home health nurse also noted that her heart rate was normal which prompted her to see her cardiologist. Patient also states she had some chest pain earlier this morning which is in the left side of her chest mainly her breast area. She denies any nausea, dizziness, or nausea with this. The pain lasted approximately one hour and was considered "tight."  The pain subsided on its own, patient did not take any nitroglycerin. Patient denies any recent dental percent contacts, recent travel. She denies any cough or wheezing at this time.  Review of Systems:  Constitutional: Denies fever, chills, diaphoresis, appetite change and fatigue.  HEENT: Denies photophobia, eye pain, redness, hearing loss, ear pain, congestion, sore throat, rhinorrhea, sneezing, mouth sores, trouble swallowing, neck pain, neck stiffness and tinnitus.   Respiratory: Complains of shortness of breath and orthopnea. Cardiovascular: Complains of chest pain, some palpitations, and lower extremity swelling.  Gastrointestinal: Denies nausea, vomiting, abdominal pain, diarrhea, constipation, blood in stool and abdominal distention.  Genitourinary: Denies dysuria, urgency, frequency, hematuria, flank pain and difficulty urinating.    Musculoskeletal: Complains of back pain. Skin: Denies pallor, rash and wound.  Neurological: Denies dizziness, seizures, syncope, weakness, light-headedness, numbness and headaches.  Hematological: Denies adenopathy. Easy bruising, personal or family bleeding history  Psychiatric/Behavioral: Denies suicidal ideation, mood changes, confusion, nervousness, sleep disturbance and agitation  Past Medical History  Diagnosis Date  . Permanent atrial fibrillation     A.  Chronic Coumadin  . MVP (mitral valve prolapse)   . Hypertension   . Nonischemic cardiomyopathy 2001    A.  07/23/11 - Echo: EF 35%;  B. 07/24/2011 - Cath: nonobs dzs ef 35%, 2+MR C. Echo 01/06/13: EF 40-45%, mild LVH, mlid MR, mod biatrial enlargment, mild RV dilatation, mod TR, PASP 54 mmHg  . Systolic CHF, chronic     A. Diag 07/2011, EF 35%  . Lumbar disc disease     laminectomy 2001  . History of endometrial cancer   . Hypertension   . Hyperlipidemia   . Rheumatoid arthritis     treated in past with Remicade and developed vasculitis  . Gout   . Complication of anesthesia   . PONV (postoperative nausea and vomiting)   . Myocardial infarction 01/06/2013    nestmi  . Shortness of breath   . GERD (gastroesophageal reflux disease)   . Coronary artery dissection 6-01/2013    95% OM3 hazy lesion on cath, medically managed   Past Surgical History  Procedure Laterality Date  . Appendectomy    . Abdominal hysterectomy  2000    Stage 1 endometrial cancer  . Hernia repair  09/2008  . Hernia repair  04/17/11    lap ventral incisional hernia repair with mesh   . Gastric biopsy      benign  . Excision  of lipoma    . Lumbar laminectomy  2001  . Hammer toe surgery    . Cardiac catheterization  01/08/13    OM3 95% lesion felt to represent coronary dissection, mild luminal irregularities elsewhere; EF 40-45%; medically managed   Social History:  reports that she has never smoked. She has never used smokeless tobacco. She reports  that she drinks alcohol. She reports that she does not use illicit drugs. Lives at home by herself. Currently uses a cane and walker for ambulation. Patient however has been on bedrest due to her recent L5 fracture.  Allergies  Allergen Reactions  . Codeine Other (See Comments)    Makes patient sleepy.  Marland Kitchen Penicillins Rash    No family history on file.   Prior to Admission medications   Medication Sig Start Date End Date Taking? Authorizing Provider  atorvastatin (LIPITOR) 40 MG tablet TAKE ONE TABLET BY MOUTH ONCE DAILY. 05/13/13  Yes Laqueta Linden, MD  carvedilol (COREG) 6.25 MG tablet TAKE 1 TABLET BY MOUTH TWICE DAILY WITH MEALS. 08/07/13  Yes Jodelle Gross, NP  Cholecalciferol (VITAMIN D) 2000 UNITS CAPS Take 2,000 Units by mouth daily.   Yes Historical Provider, MD  febuxostat (ULORIC) 40 MG tablet Take 40 mg by mouth daily.   Yes Historical Provider, MD  furosemide (LASIX) 40 MG tablet Take 1 tablet (40 mg total) by mouth daily. 08/07/13  Yes Jodelle Gross, NP  gabapentin (NEURONTIN) 300 MG capsule Take 300 mg by mouth at bedtime.   Yes Historical Provider, MD  hydroxychloroquine (PLAQUENIL) 200 MG tablet Take 1 tablet by mouth Twice daily. 07/19/12  Yes Historical Provider, MD  Hypromellose (GENTEAL OP) Place 1 drop into both eyes daily as needed. Tired Eyes   Yes Historical Provider, MD  losartan (COZAAR) 50 MG tablet TAKE ONE TABLET BY MOUTH ONCE DAILY. 07/31/13  Yes Laqueta Linden, MD  nitroGLYCERIN (NITROSTAT) 0.4 MG SL tablet Place 1 tablet (0.4 mg total) under the tongue every 5 (five) minutes as needed for chest pain. 01/16/13  Yes Roger A Arguello, PA-C  potassium chloride (K-DUR) 10 MEQ tablet Take 10 mEq by mouth 2 (two) times daily. 01/18/13  Yes Roger A Arguello, PA-C  predniSONE (DELTASONE) 5 MG tablet Take 5 mg by mouth daily.   Yes Historical Provider, MD  raloxifene (EVISTA) 60 MG tablet Take 60 mg by mouth daily.   Yes Historical Provider, MD  traMADol  (ULTRAM) 50 MG tablet Take 50 mg by mouth daily as needed.    Yes Historical Provider, MD  warfarin (COUMADIN) 5 MG tablet Take 1 tablet daily except 1/2 tablet on Mondays and Thursdays 07/08/13  Yes Laqueta Linden, MD   Physical Exam: Filed Vitals:   08/21/13 1450  BP: 113/75  Pulse: 100  Temp:   Resp: 22     General: Well developed, well nourished, NAD, appears stated age  HEENT: NCAT, PERRLA, EOMI, Anicteic Sclera, mucous membranes moist. No pharyngeal erythema or exudates  Neck: Supple, + JVD, no masses  Cardiovascular: S1 S2 auscultated, irregularly irregular  Respiratory: Diminished breath sounds however clear, no wheezing or crackles noted.  Abdomen: Soft, nontender, nondistended, + bowel sounds  Extremities: warm dry without cyanosis clubbing. +1pitting edema in LE B/L  Neuro: AAOx3, cranial nerves grossly intact. Strength 5/5 in patient's upper and lower extremities bilaterally  Skin: Without rashes exudates or nodules  Psych: Normal affect and demeanor with intact judgement and insight  Labs on Admission:  Basic Metabolic  Panel:  Recent Labs Lab 08/21/13 1231  NA 146  K 4.1  CL 104  CO2 29  GLUCOSE 88  BUN 42*  CREATININE 2.19*  CALCIUM 10.0   Liver Function Tests: No results found for this basename: AST, ALT, ALKPHOS, BILITOT, PROT, ALBUMIN,  in the last 168 hours No results found for this basename: LIPASE, AMYLASE,  in the last 168 hours No results found for this basename: AMMONIA,  in the last 168 hours CBC:  Recent Labs Lab 08/21/13 1231  WBC 8.5  NEUTROABS 6.0  HGB 12.5  HCT 39.2  MCV 91.2  PLT 174   Cardiac Enzymes:  Recent Labs Lab 08/21/13 1220  TROPONINI <0.30    BNP (last 3 results)  Recent Labs  01/05/13 1903 08/21/13 1220  PROBNP 3772.0* 8457.0*   CBG: No results found for this basename: GLUCAP,  in the last 168 hours  Radiological Exams on Admission: Dg Chest Portable 1 View  08/21/2013   CLINICAL DATA:   Shortness of breath for 3 days, history atrial fibrillation, hypertension, CHF, prior MI, endometrial cancer, GERD  EXAM: PORTABLE CHEST - 1 VIEW  COMPARISON:  Portable exam 1207 hr compared to 01/07/2013  FINDINGS: Enlargement of cardiac silhouette with pulmonary vascular congestion.  Atherosclerotic calcification of a tortuous thoracic aorta.  Chronic elevation of right diaphragm with minimal right basilar atelectasis.  Central bronchitic changes.  No definite infiltrate, pleural effusion or pneumothorax.  IMPRESSION: Enlargement of cardiac silhouette with pulmonary vascular congestion.  Bronchitic changes with minimal right base atelectasis.   Electronically Signed   By: Ulyses Southward M.D.   On: 08/21/2013 12:18    EKG: Independently reviewed. Atrial fibrillation with RVR, rate 124  Assessment/Plan  Acute systolic CHF exacerbation Patient will be admitted to the step down unit. Will hold her by mouth Lasix in the IV Lasix. We'll monitor her daily weights, input and output, placed on fluid restriction of 1200 mL's per day. Patient was found to have BNP over 8000.  Her last echocardiogram was conducted on 01/06/2013 showing an EF of 40-45%. Will also consult cardiology for further intervention, as they changed her medications approximately one to 2 weeks ago.  Atrial fibrillation with RVR Possibly secondary to CHF exacerbation. Currently patient is therapeutic on her Coumadin with an INR of 3.17. Will consult with pharmacy to follow and dose her Coumadin. Will also check a TSH level, magnesium and phosphorus levels. Patient has been placed on a Cardizem drip, will continue that to maintain her heart rate around 100-120.  Will also consult cardiology for further intervention and management.  Will continue her home medications of Coreg.  Acute on chronic kidney disease Creatinine is currently 2.19. Patient appears to have a baseline of approximately 1.5. Although currently on lasix, will continue due to  her CHF exacerbation.  Will hold her ARB.  Atypical chest pain Troponin currently negative. Will continue to monitor and cycle her troponins. At this time it is likely secondary to her A. fib with RVR as well as CHF exacerbation. Will continue to monitor.  Hypertension Currently controlled.  Will continue her on Cardizem drip, her home medication of Coreg, however will hold her ARB.  Hyperlipidemia Will continue Lipitor.  Gout Will continue her Uloric.  Rheumatoid arthritis Will continue her Evista, prednisone, plaquenil, tramadol for pain.  L5 fracture Will continue tramadol for pain control will add on morphine for severe pain.  DVT prophylaxis: Warfarin  Code Status: Full  Condition: Guarded  Family Communication: Daughter  at bedside.  Admission, patients condition and plan of care including tests being ordered have been discussed with the patient and daughter who indicate understanding and agree with the plan and Code Status.  Disposition Plan: Admitted.    Time spent: 60 minutes  Keala Drum D.O. [facility] Pager [phone]  If 7PM-7AM, please contact night-coverage [email or web address] Password [facility] 08/21/2013, 3:19 PM

## 2013-08-21 NOTE — Progress Notes (Signed)
ANTICOAGULATION CONSULT NOTE - Initial Consult  Pharmacy Consult for Warfarin Indication: atrial fibrillation  Allergies  Allergen Reactions  . Codeine Other (See Comments)    Makes patient sleepy.  Marland Kitchen Penicillins Rash    Patient Measurements: Height: 5\' 5"  (165.1 cm) Weight: 178 lb (80.74 kg) IBW/kg (Calculated) : 57 Heparin Dosing Weight:   Vital Signs: Temp: 98.3 F (36.8 C) (02/12 1148) Temp src: Oral (02/12 1148) BP: 112/68 mmHg (02/12 1530) Pulse Rate: 100 (02/12 1450)  Labs:  Recent Labs  08/21/13 1220 08/21/13 1231  HGB  --  12.5  HCT  --  39.2  PLT  --  174  APTT  --  46*  LABPROT  --  31.4*  INR  --  3.17*  CREATININE  --  2.19*  TROPONINI <0.30  --     Estimated Creatinine Clearance: 22.2 ml/min (by C-G formula based on Cr of 2.19).   Medical History: Past Medical History  Diagnosis Date  . Permanent atrial fibrillation     A.  Chronic Coumadin  . MVP (mitral valve prolapse)   . Essential hypertension, benign   . Nonischemic cardiomyopathy 2001    A.  07/23/11 - Echo: EF 35%;  B. 07/24/2011 - Cath: nonobs dzs ef 35%, 2+MR C. Echo 01/06/13: EF 40-45%, mild LVH, mlid MR, mod biatrial enlargment, mild RV dilatation, mod TR, PASP 54 mmHg  . Systolic CHF, chronic     A. Diag 07/2011, EF 35%  . Lumbar disc disease     Laminectomy 2001  . History of endometrial cancer   . Hyperlipidemia   . Rheumatoid arthritis     Treated in past with Remicade and developed vasculitis  . Gout   . PONV (postoperative nausea and vomiting)   . NSTEMI (non-ST elevated myocardial infarction) 12/2012  . GERD (gastroesophageal reflux disease)   . Coronary artery dissection 12/2012    95% OM3 hazy lesion on cath, medically managed    Medications:  Scheduled:    Assessment: Continuation of warfarin PTA for atrial fibrillation PTA Warfarin 5 mg Tuesday, Wednesday, Friday, Saturday, Sunday: 2.5 mg on Monday, Thursday INR elevated on admission, 3.17  Goal of Therapy:   INR 2-3 Monitor platelets by anticoagulation protocol: Yes   Plan:  No warfarin today due to elevated INR INR/PT daily Monitor CBC, platelets Labs per protocol  Friday, Luian Schumpert Bennett 08/21/2013,3:52 PM

## 2013-08-21 NOTE — ED Notes (Signed)
Pt c/o increased SOB over last 2 days. Pt has hx of CHF and a-fib. Pt also reports intermittent epigastric pain x1 day. Pt not currently in distress.

## 2013-08-21 NOTE — ED Notes (Signed)
Complain of being more short of breath today than usual. Also, complain of upper abdominal pain that started yesterday

## 2013-08-22 DIAGNOSIS — I369 Nonrheumatic tricuspid valve disorder, unspecified: Secondary | ICD-10-CM

## 2013-08-22 LAB — LIPID PANEL
CHOL/HDL RATIO: 2.2 ratio
Cholesterol: 129 mg/dL (ref 0–200)
HDL: 59 mg/dL (ref >39)
LDL CALC: 58 mg/dL (ref 0–99)
Triglycerides: 60 mg/dL (ref <150)
VLDL: 12 mg/dL (ref 0–40)

## 2013-08-22 LAB — CBC
HCT: 35.2 % — ABNORMAL LOW (ref 36.0–46.0)
Hemoglobin: 11.2 g/dL — ABNORMAL LOW (ref 12.0–15.0)
MCH: 29.1 pg (ref 26.0–34.0)
MCHC: 31.8 g/dL (ref 30.0–36.0)
MCV: 91.4 fL (ref 78.0–100.0)
PLATELETS: 188 10*3/uL (ref 150–400)
RBC: 3.85 MIL/uL — ABNORMAL LOW (ref 3.87–5.11)
RDW: 17.1 % — AB (ref 11.5–15.5)
WBC: 7.3 10*3/uL (ref 4.0–10.5)

## 2013-08-22 LAB — BASIC METABOLIC PANEL
BUN: 40 mg/dL — ABNORMAL HIGH (ref 6–23)
CALCIUM: 9.5 mg/dL (ref 8.4–10.5)
CO2: 30 mEq/L (ref 19–32)
Chloride: 106 mEq/L (ref 96–112)
Creatinine, Ser: 2.29 mg/dL — ABNORMAL HIGH (ref 0.50–1.10)
GFR calc Af Amer: 22 mL/min — ABNORMAL LOW (ref >90)
GFR, EST NON AFRICAN AMERICAN: 19 mL/min — AB (ref >90)
GLUCOSE: 86 mg/dL (ref 70–99)
Potassium: 3.8 mEq/L (ref 3.7–5.3)
SODIUM: 147 meq/L (ref 137–147)

## 2013-08-22 LAB — PROTIME-INR
INR: 3.47 — AB (ref 0.00–1.49)
PROTHROMBIN TIME: 33.6 s — AB (ref 11.6–15.2)

## 2013-08-22 LAB — TROPONIN I

## 2013-08-22 LAB — TSH: TSH: 0.829 u[IU]/mL (ref 0.350–4.500)

## 2013-08-22 LAB — MAGNESIUM: Magnesium: 2 mg/dL (ref 1.5–2.5)

## 2013-08-22 LAB — PHOSPHORUS: PHOSPHORUS: 3.8 mg/dL (ref 2.3–4.6)

## 2013-08-22 NOTE — Progress Notes (Signed)
Patient ID: Marissa [name], female   DOB: 1932-09-30, 80 y.o.   MRN: [id number]    Subjective:  Denies SSCP, palpitations or Dyspnea   Objective:  Filed Vitals:   08/22/13 0500 08/22/13 0600 08/22/13 0730 08/22/13 0734  BP: 95/53 84/50  119/73  Pulse: 50 54  72  Temp:   97.7 F (36.5 C)   TempSrc:   Oral   Resp: 12 10    Height:      Weight: 182 lb 1.6 oz (82.6 kg)     SpO2: 94% 99%      Intake/Output from previous day:  Intake/Output Summary (Last 24 hours) at 08/22/13 0910 Last data filed at 08/22/13 0600  Gross per 24 hour  Intake 1243.75 ml  Output   2500 ml  Net -1256.25 ml    Physical Exam: Affect appropriate Pale frail female  HEENT: normal Neck supple with no adenopathy JVP normal no bruits no thyromegaly Lungs rhonchi  no wheezing and good diaphragmatic motion Heart:  S1/S2 no murmur, no rub, gallop or click PMI normal Abdomen: benighn, BS positve, no tenderness, no AAA no bruit.  No HSM or HJR Distal pulses intact with no bruits No edema Neuro non-focal Skin warm and dry No muscular weakness   Lab Results: Basic Metabolic Panel:  Recent Labs  41/74/08 1231 08/22/13 0441  NA 146 147  K 4.1 3.8  CL 104 106  CO2 29 30  GLUCOSE 88 86  BUN 42* 40*  CREATININE 2.19* 2.29*  CALCIUM 10.0 9.5  MG  --  2.0  PHOS  --  3.8   CBC:  Recent Labs  08/21/13 1231 08/22/13 0441  WBC 8.5 7.3  NEUTROABS 6.0  --   HGB 12.5 11.2*  HCT 39.2 35.2*  MCV 91.2 91.4  PLT 174 188   Cardiac Enzymes:  Recent Labs  08/21/13 1702 08/21/13 2255 08/22/13 0441  TROPONINI <0.30 <0.30 <0.30   Fasting Lipid Panel:  Recent Labs  08/22/13 0441  CHOL 129  HDL 59  LDLCALC 58  TRIG 60  CHOLHDL 2.2   Thyroid Function Tests:  Recent Labs  08/21/13 1702  TSH 0.829    Imaging: Dg Chest Portable 1 View  08/21/2013   CLINICAL DATA:  Shortness of breath for 3 days, history atrial fibrillation, hypertension, CHF, prior MI, endometrial cancer, GERD   EXAM: PORTABLE CHEST - 1 VIEW  COMPARISON:  Portable exam 1207 hr compared to 01/07/2013  FINDINGS: Enlargement of cardiac silhouette with pulmonary vascular congestion.  Atherosclerotic calcification of a tortuous thoracic aorta.  Chronic elevation of right diaphragm with minimal right basilar atelectasis.  Central bronchitic changes.  No definite infiltrate, pleural effusion or pneumothorax.  IMPRESSION: Enlargement of cardiac silhouette with pulmonary vascular congestion.  Bronchitic changes with minimal right base atelectasis.   Electronically Signed   By: Ulyses Southward M.D.   On: 08/21/2013 12:18    Cardiac Studies:  ECG:  afib nonspecific ST T wave changes    Telemetry:  afib rates 60  Echo:  6/14  EF 40-45% mild MR   Medications:   . antiseptic oral rinse  15 mL Mouth Rinse BID  . atorvastatin  40 mg Oral q1800  . carvedilol  6.25 mg Oral BID WC  . cholecalciferol  2,000 Units Oral Daily  . febuxostat  40 mg Oral Daily  . furosemide  40 mg Intravenous Daily  . gabapentin  300 mg Oral QHS  . hydroxychloroquine  200 mg Oral BID  .  potassium chloride  10 mEq Oral BID  . predniSONE  5 mg Oral Daily  . raloxifene  60 mg Oral Daily  . sodium chloride  3 mL Intravenous Q12H  . Warfarin - Pharmacist Dosing Inpatient   Does not apply Q24H     . diltiazem (CARDIZEM) infusion 5 mg/hr (08/22/13 0600)    Assessment/Plan:  1. Permanent atrial fibrillation, now with rapid ventricular rates. Patient reports compliance with Coreg at 6.25 mg twice daily. She is on Coumadin with INR 3.1.  2. Prior documentation of tachycardia-bradycardia syndrome, was taken off Toprol-XL and digoxin related to this in the past, had greater than 3 second pauses at one point. She seems to be tolerating Coreg, and was actually to wear a cardiac monitor following increase in dose recently.  3. Nonischemic cardiomyopathy, LVEF 40-45% as of echocardiogram June 2014. Looks to have associated component of acute on chronic  combined heart failure.  4. CAD, history of obtuse marginal disease documented in setting of NSTEMI back in the summer of 2014, managed medically without PCI.  5. History of osteoporosis and previous laminectomy, reportedly with active lumbar fracture that is being followed conservatively. Patient is functionally limited, essentially at bed rest over the last 6 weeks. She is followed by Dr. Newell Coral.  6. Acute on chronic renal insufficiency, creatinine 2.1 up from 1.3.  Recommendations  D/C iv cardizem Continue coreg 6.25 bid Continue lasix iv  Ok to move to floor  [employer] 08/22/2013, 9:10 AM

## 2013-08-22 NOTE — Progress Notes (Signed)
Nutrition Brief Note  Patient identified on the Malnutrition Screening Tool (MST) Report  Wt Readings from Last 15 Encounters:  08/22/13 182 lb 1.6 oz (82.6 kg)  08/07/13 179 lb (81.194 kg)  02/28/13 176 lb 4 oz (79.946 kg)  01/23/13 173 lb (78.472 kg)  01/16/13 171 lb 3.2 oz (77.656 kg)  01/16/13 171 lb 3.2 oz (77.656 kg)  11/19/12 175 lb 6.4 oz (79.561 kg)  08/13/12 163 lb (73.936 kg)  08/06/12 170 lb (77.111 kg)  07/29/12 186 lb 1.9 oz (84.423 kg)  05/05/12 182 lb 15.7 oz (83 kg)  02/02/12 188 lb 1.9 oz (85.331 kg)  11/07/11 190 lb (86.183 kg)  08/09/11 181 lb (82.101 kg)  08/07/11 182 lb (82.555 kg)    Body mass index is 30.3 kg/(m^2). Patient meets criteria for obesity, class I based on current BMI.   Current diet order is Heart Healthy, patient is consuming approximately n/a% of meals at this time. Labs and medications reviewed.   No nutrition interventions warranted at this time. If nutrition issues arise, please consult RD.   Renesme Kerrigan A. Mayford Knife, RD, LDN Pager: [phone]

## 2013-08-22 NOTE — Progress Notes (Signed)
Report called and given to Zannie Kehr, RN. Patient alert, oriented and in stable condition at the time of transport.

## 2013-08-22 NOTE — Progress Notes (Signed)
Patients daughter has refused to allows staff to remove patients foley. She says her mom "is on strict bedrest and she is not acclimated to the staff and has urgency when she has to urinate". Patient uses BSC at home with assistance of aid. Patient is alert and oriented and is agreeing with daughter at this time but previous said that removing the foley would be an okay idea. Patient and daughter educated on hospital policy and protocol r/t to foley catheters and educated on increased risk of infection.

## 2013-08-22 NOTE — Care Management Note (Addendum)
    Page 1 of 1   08/26/2013     11:16:45 AM   CARE MANAGEMENT NOTE 08/26/2013  Patient:  MONNA, CREAN   Account Number:  [id number]  Date Initiated:  08/22/2013  Documentation initiated by:  Rosemary Holms  Subjective/Objective Assessment:   pt is bed bound and unable to reposition herself, needing to elevate her head of bed. Request a hospital bed.     Action/Plan:   [facility] notifed of potential order   Anticipated DC Date:  08/27/2013   Anticipated DC Plan:  SKILLED NURSING FACILITY  In-house referral  Clinical Social Worker      DC Planning Services  CM consult      Choice offered to / List presented to:             Status of service:  Completed, signed off Medicare Important Message given?  YES (If response is "NO", the following Medicare IM given date fields will be blank) Date Medicare IM given:  08/26/2013 Date Additional Medicare IM given:    Discharge Disposition:  SKILLED NURSING FACILITY  Per UR Regulation:    If discussed at Long Length of Stay Meetings, dates discussed:   08/26/2013    Comments:  08/26/13 1115 Arlyss Queen, RN BSN CM Pt discharged to [facility] today. CSW to arrange discharge to facility.  08/25/13 1540 Arlyss Queen, RN BSN CM Pt admitted with CHF and afib. Pt living at home alone with caregiver 8am-6pm daily. Pt alone in the evening with daughter coming in and out. Pt has cane, walker, and BSC for home use. Pts daughter feels pt needs SNF at discharge and pt is now agreeing. PT to consult. CSW is aware.  08/22/13 Rosemary Holms RN BSN CM

## 2013-08-22 NOTE — Progress Notes (Signed)
Pharmacist Heart Failure Core Measure Documentation  Assessment: Marissa [name] has an EF documented as 20-25% on 08/22/13 by echo.  Rationale: Heart failure patients with left ventricular systolic dysfunction (LVSD) and an EF < 40% should be prescribed an angiotensin converting enzyme inhibitor (ACEI) or angiotensin receptor blocker (ARB) at discharge unless a contraindication is documented in the medical record.  This patient is not currently on an ACEI or ARB for HF.  This note is being placed in the record in order to provide documentation that a contraindication to the use of these agents is present for this encounter.  ACE Inhibitor or Angiotensin Receptor Blocker is contraindicated (specify all that apply)  []   ACEI allergy AND ARB allergy []   Angioedema []   Moderate or severe aortic stenosis []   Hyperkalemia []   Hypotension []   Renal artery stenosis [x]   Worsening renal function, preexisting renal disease or dysfunction   A 08/22/2013 1:46 PM

## 2013-08-22 NOTE — Progress Notes (Signed)
  Echocardiogram 2D Echocardiogram has been performed.  Roberts, Kyan Yurkovich M 08/22/2013, 9:16 AM 

## 2013-08-22 NOTE — Progress Notes (Signed)
Utilization Review Complete  

## 2013-08-22 NOTE — Progress Notes (Signed)
ANTICOAGULATION CONSULT NOTE - follow up  Pharmacy Consult for Warfarin Indication: atrial fibrillation  Allergies  Allergen Reactions  . Codeine Other (See Comments)    Makes patient sleepy.  Marland Kitchen Penicillins Rash   Patient Measurements: Height: 5\' 5"  (165.1 cm) Weight: 182 lb 1.6 oz (82.6 kg) IBW/kg (Calculated) : 57  Vital Signs: Temp: 98.6 F (37 C) (02/13 0400) Temp src: Oral (02/13 0400) BP: 119/73 mmHg (02/13 0734) Pulse Rate: 72 (02/13 0734)  Labs:  Recent Labs  08/21/13 1231 08/21/13 1702 08/21/13 2255 08/22/13 0441  HGB 12.5  --   --  11.2*  HCT 39.2  --   --  35.2*  PLT 174  --   --  188  APTT 46*  --   --   --   LABPROT 31.4*  --   --  33.6*  INR 3.17*  --   --  3.47*  CREATININE 2.19*  --   --  2.29*  TROPONINI  --  <0.30 <0.30 <0.30    Estimated Creatinine Clearance: 21.5 ml/min (by C-G formula based on Cr of 2.29).  Medical History: Past Medical History  Diagnosis Date  . Permanent atrial fibrillation     A.  Chronic Coumadin  . MVP (mitral valve prolapse)   . Essential hypertension, benign   . Nonischemic cardiomyopathy 2001    A.  07/23/11 - Echo: EF 35%;  B. 07/24/2011 - Cath: nonobs dzs ef 35%, 2+MR C. Echo 01/06/13: EF 40-45%, mild LVH, mlid MR, mod biatrial enlargment, mild RV dilatation, mod TR, PASP 54 mmHg  . Systolic CHF, chronic     A. Diag 07/2011, EF 35%  . Lumbar disc disease     Laminectomy 2001  . History of endometrial cancer   . Hyperlipidemia   . Rheumatoid arthritis     Treated in past with Remicade and developed vasculitis  . Gout   . PONV (postoperative nausea and vomiting)   . NSTEMI (non-ST elevated myocardial infarction) 12/2012  . GERD (gastroesophageal reflux disease)   . Coronary artery dissection 12/2012    95% OM3 hazy lesion on cath, medically managed   Medications:  Scheduled:  . antiseptic oral rinse  15 mL Mouth Rinse BID  . atorvastatin  40 mg Oral q1800  . carvedilol  6.25 mg Oral BID WC  .  cholecalciferol  2,000 Units Oral Daily  . febuxostat  40 mg Oral Daily  . furosemide  40 mg Intravenous Daily  . gabapentin  300 mg Oral QHS  . hydroxychloroquine  200 mg Oral BID  . potassium chloride  10 mEq Oral BID  . predniSONE  5 mg Oral Daily  . raloxifene  60 mg Oral Daily  . sodium chloride  3 mL Intravenous Q12H  . Warfarin - Pharmacist Dosing Inpatient   Does not apply Q24H   Assessment: Continuation of warfarin PTA for atrial fibrillation PTA Warfarin 5 mg Tuesday, Wednesday, Friday, Saturday, Sunday: 2.5 mg on Monday, Thursday INR elevated above goal.  Goal of Therapy:  INR 2-3 Monitor platelets by anticoagulation protocol: Yes   Plan:  No warfarin today due to elevated INR INR/PT daily Monitor CBC, platelets Labs per protocol  Friday A 08/22/2013,8:20 AM

## 2013-08-23 LAB — URINE CULTURE: Colony Count: >=100000

## 2013-08-23 LAB — BASIC METABOLIC PANEL
BUN: 38 mg/dL — ABNORMAL HIGH (ref 6–23)
CO2: 31 mEq/L (ref 19–32)
Calcium: 9.7 mg/dL (ref 8.4–10.5)
Chloride: 105 mEq/L (ref 96–112)
Creatinine, Ser: 1.97 mg/dL — ABNORMAL HIGH (ref 0.50–1.10)
GFR calc Af Amer: 27 mL/min — ABNORMAL LOW (ref >90)
GFR, EST NON AFRICAN AMERICAN: 23 mL/min — AB (ref >90)
Glucose, Bld: 87 mg/dL (ref 70–99)
POTASSIUM: 3.5 meq/L — AB (ref 3.7–5.3)
SODIUM: 147 meq/L (ref 137–147)

## 2013-08-23 LAB — PROTIME-INR
INR: 2.8 — AB (ref 0.00–1.49)
PROTHROMBIN TIME: 28.5 s — AB (ref 11.6–15.2)

## 2013-08-23 MED ORDER — POTASSIUM CHLORIDE CRYS ER 20 MEQ PO TBCR
20.0000 meq | EXTENDED_RELEASE_TABLET | Freq: Three times a day (TID) | ORAL | Status: DC
  Administered 2013-08-23 – 2013-08-26 (×9): 20 meq via ORAL
  Filled 2013-08-23 (×10): qty 1

## 2013-08-23 MED ORDER — WARFARIN SODIUM 2.5 MG PO TABS
2.5000 mg | ORAL_TABLET | Freq: Once | ORAL | Status: AC
  Administered 2013-08-23: 2.5 mg via ORAL
  Filled 2013-08-23: qty 1

## 2013-08-23 MED ORDER — BISACODYL 5 MG PO TBEC
5.0000 mg | DELAYED_RELEASE_TABLET | Freq: Every day | ORAL | Status: DC | PRN
  Administered 2013-08-24: 5 mg via ORAL
  Filled 2013-08-23 (×2): qty 1

## 2013-08-23 MED ORDER — CEPHALEXIN 250 MG PO CAPS
250.0000 mg | ORAL_CAPSULE | Freq: Three times a day (TID) | ORAL | Status: DC
  Administered 2013-08-23 – 2013-08-26 (×8): 250 mg via ORAL
  Filled 2013-08-23 (×8): qty 1

## 2013-08-23 NOTE — Progress Notes (Signed)
Marissa [name], Marissa [name]                ACCOUNT NO.:  [id number]  MEDICAL RECORD NO.:  [id number]  LOCATION:  A304                          FACILITY:  [facility]  PHYSICIAN:  Kingsley Callander. [name], MD       DATE OF BIRTH:  1932/08/14  DATE OF PROCEDURE:  08/22/2013 DATE OF DISCHARGE:                                PROGRESS NOTE   SUBJECTIVE:  Marissa [name] was admitted yesterday with heart failure and rapid atrial fibrillation.  She has been treated with IV Lasix.  She has been treated with IV diltiazem.  Her heart rate now is down into the 70 range on 5 mg of IV diltiazem per hour.  She is breathing more comfortably.  She has diuresed 2500 mL.  OBJECTIVE:  VITAL SIGNS:  Temperature 98.6, pulse 72, respirations 10, blood pressure 119/73, oxygen saturation 99%. LUNGS:  Minimal basilar rales. HEART:  Irregularly irregular. ABDOMEN:  Soft and nontender. EXTREMITIES:  No edema.  IMPRESSION/PLAN: 1. Congestive heart failure, improved.  Continue IV Lasix.  BUN and     creatinine are up to 40 and 2.29.  Potassium is normal at 3.8. 2. Rapid atrial fibrillation, improved on IV diltiazem.  Conversion TO     oral therapy will be left to cardiology.  She is over     anticoagulated with an INR of 3.47.  Coumadin will be held again     today.  She is euthyroid with a TSH of 0.82.  Repeat echo is     pending. 3. Rheumatoid arthritis, stable 4. Coronary artery disease.  Troponins are normal.     Kingsley Callander. [name], MD     ROF/MEDQ  D:  08/22/2013  T:  08/23/2013  Job:  [id number]

## 2013-08-23 NOTE — Progress Notes (Signed)
ANTICOAGULATION CONSULT NOTE - follow up  Pharmacy Consult for Warfarin Indication: atrial fibrillation  Allergies  Allergen Reactions  . Codeine Other (See Comments)    Makes patient sleepy.  Marland Kitchen Penicillins Rash   Patient Measurements: Height: 5\' 5"  (165.1 cm) Weight: 185 lb 10 oz (84.2 kg) IBW/kg (Calculated) : 57  Vital Signs: Temp: 98.3 F (36.8 C) (02/14 0532) Temp src: Oral (02/14 0532) BP: 107/71 mmHg (02/14 0532) Pulse Rate: 82 (02/14 0532)  Labs:  Recent Labs  08/21/13 1231 08/21/13 1702 08/21/13 2255 08/22/13 0441 08/23/13 0555  HGB 12.5  --   --  11.2*  --   HCT 39.2  --   --  35.2*  --   PLT 174  --   --  188  --   APTT 46*  --   --   --   --   LABPROT 31.4*  --   --  33.6* 28.5*  INR 3.17*  --   --  3.47* 2.80*  CREATININE 2.19*  --   --  2.29* 1.97*  TROPONINI  --  <0.30 <0.30 <0.30  --     Estimated Creatinine Clearance: 25.2 ml/min (by C-G formula based on Cr of 1.97).  Medical History: Past Medical History  Diagnosis Date  . Permanent atrial fibrillation     A.  Chronic Coumadin  . MVP (mitral valve prolapse)   . Essential hypertension, benign   . Nonischemic cardiomyopathy 2001    A.  07/23/11 - Echo: EF 35%;  B. 07/24/2011 - Cath: nonobs dzs ef 35%, 2+MR C. Echo 01/06/13: EF 40-45%, mild LVH, mlid MR, mod biatrial enlargment, mild RV dilatation, mod TR, PASP 54 mmHg  . Systolic CHF, chronic     A. Diag 07/2011, EF 35%  . Lumbar disc disease     Laminectomy 2001  . History of endometrial cancer   . Hyperlipidemia   . Rheumatoid arthritis     Treated in past with Remicade and developed vasculitis  . Gout   . PONV (postoperative nausea and vomiting)   . NSTEMI (non-ST elevated myocardial infarction) 12/2012  . GERD (gastroesophageal reflux disease)   . Coronary artery dissection 12/2012    95% OM3 hazy lesion on cath, medically managed   Medications:  Scheduled:  . antiseptic oral rinse  15 mL Mouth Rinse BID  . atorvastatin  40 mg Oral  q1800  . carvedilol  6.25 mg Oral BID WC  . cholecalciferol  2,000 Units Oral Daily  . febuxostat  40 mg Oral Daily  . furosemide  40 mg Intravenous Daily  . gabapentin  300 mg Oral QHS  . hydroxychloroquine  200 mg Oral BID  . potassium chloride  10 mEq Oral BID  . predniSONE  5 mg Oral Daily  . raloxifene  60 mg Oral Daily  . sodium chloride  3 mL Intravenous Q12H  . Warfarin - Pharmacist Dosing Inpatient   Does not apply Q24H   Assessment: Continuation of warfarin PTA for atrial fibrillation PTA Warfarin 5 mg Tuesday, Wednesday, Friday, Saturday, Sunday: 2.5 mg on Monday, Thursday INR now within goal range.  Goal of Therapy:  INR 2-3   Plan:  Warfarin 2.5mg  PO x 1 today. INR/PT daily Labs per protocol  Saturday 08/23/2013,9:37 AM

## 2013-08-24 LAB — PROTIME-INR
INR: 2.57 — AB (ref 0.00–1.49)
PROTHROMBIN TIME: 26.7 s — AB (ref 11.6–15.2)

## 2013-08-24 LAB — BASIC METABOLIC PANEL
BUN: 33 mg/dL — ABNORMAL HIGH (ref 6–23)
CO2: 31 mEq/L (ref 19–32)
Calcium: 9.3 mg/dL (ref 8.4–10.5)
Chloride: 106 mEq/L (ref 96–112)
Creatinine, Ser: 1.67 mg/dL — ABNORMAL HIGH (ref 0.50–1.10)
GFR calc Af Amer: 33 mL/min — ABNORMAL LOW (ref >90)
GFR calc non Af Amer: 28 mL/min — ABNORMAL LOW (ref >90)
Glucose, Bld: 80 mg/dL (ref 70–99)
Potassium: 3.9 mEq/L (ref 3.7–5.3)
SODIUM: 145 meq/L (ref 137–147)

## 2013-08-24 MED ORDER — WARFARIN SODIUM 2.5 MG PO TABS
2.5000 mg | ORAL_TABLET | Freq: Once | ORAL | Status: AC
  Administered 2013-08-24: 2.5 mg via ORAL
  Filled 2013-08-24: qty 1

## 2013-08-24 MED ORDER — ONDANSETRON HCL 4 MG PO TABS: 4.0000 mg | ORAL_TABLET | Freq: Three times a day (TID) | ORAL | Status: DC | PRN

## 2013-08-24 NOTE — Progress Notes (Signed)
ANTICOAGULATION CONSULT NOTE  Pharmacy Consult for Warfarin Indication: atrial fibrillation  Allergies  Allergen Reactions  . Codeine Other (See Comments)    Makes patient sleepy.  Marland Kitchen Penicillins Rash   Patient Measurements: Height: 5\' 5"  (165.1 cm) Weight: 185 lb 6.5 oz (84.1 kg) IBW/kg (Calculated) : 57  Vital Signs: Temp: 98.3 F (36.8 C) (02/15 0516) Temp src: Oral (02/15 0516) BP: 111/72 mmHg (02/15 0516) Pulse Rate: 85 (02/15 0516)  Labs:  Recent Labs  08/21/13 1231 08/21/13 1702 08/21/13 2255 08/22/13 0441 08/23/13 0555 08/24/13 0524  HGB 12.5  --   --  11.2*  --   --   HCT 39.2  --   --  35.2*  --   --   PLT 174  --   --  188  --   --   APTT 46*  --   --   --   --   --   LABPROT 31.4*  --   --  33.6* 28.5* 26.7*  INR 3.17*  --   --  3.47* 2.80* 2.57*  CREATININE 2.19*  --   --  2.29* 1.97* 1.67*  TROPONINI  --  <0.30 <0.30 <0.30  --   --     Estimated Creatinine Clearance: 29.7 ml/min (by C-G formula based on Cr of 1.67).  Medical History: Past Medical History  Diagnosis Date  . Permanent atrial fibrillation     A.  Chronic Coumadin  . MVP (mitral valve prolapse)   . Essential hypertension, benign   . Nonischemic cardiomyopathy 2001    A.  07/23/11 - Echo: EF 35%;  B. 07/24/2011 - Cath: nonobs dzs ef 35%, 2+MR C. Echo 01/06/13: EF 40-45%, mild LVH, mlid MR, mod biatrial enlargment, mild RV dilatation, mod TR, PASP 54 mmHg  . Systolic CHF, chronic     A. Diag 07/2011, EF 35%  . Lumbar disc disease     Laminectomy 2001  . History of endometrial cancer   . Hyperlipidemia   . Rheumatoid arthritis     Treated in past with Remicade and developed vasculitis  . Gout   . PONV (postoperative nausea and vomiting)   . NSTEMI (non-ST elevated myocardial infarction) 12/2012  . GERD (gastroesophageal reflux disease)   . Coronary artery dissection 12/2012    95% OM3 hazy lesion on cath, medically managed   Medications:  Scheduled:  . antiseptic oral rinse  15  mL Mouth Rinse BID  . atorvastatin  40 mg Oral q1800  . carvedilol  6.25 mg Oral BID WC  . cephALEXin  250 mg Oral 3 times per day  . cholecalciferol  2,000 Units Oral Daily  . febuxostat  40 mg Oral Daily  . furosemide  40 mg Intravenous Daily  . gabapentin  300 mg Oral QHS  . hydroxychloroquine  200 mg Oral BID  . potassium chloride  20 mEq Oral TID  . predniSONE  5 mg Oral Daily  . raloxifene  60 mg Oral Daily  . sodium chloride  3 mL Intravenous Q12H  . Warfarin - Pharmacist Dosing Inpatient   Does not apply Q24H   Assessment: Continuation of warfarin PTA for atrial fibrillation PTA Warfarin 5 mg Tuesday, Wednesday, Friday, Saturday, Sunday: 2.5 mg on Monday, Thursday INR remains within goal range.  Goal of Therapy:  INR 2-3   Plan:  Repeat Warfarin 2.5mg  PO x 1 today. INR/PT daily Labs per protocol  Monday 08/24/2013,10:44 AM

## 2013-08-25 ENCOUNTER — Inpatient Hospital Stay (HOSPITAL_COMMUNITY): Payer: Medicare Other

## 2013-08-25 DIAGNOSIS — I519 Heart disease, unspecified: Secondary | ICD-10-CM

## 2013-08-25 DIAGNOSIS — I472 Ventricular tachycardia: Secondary | ICD-10-CM

## 2013-08-25 DIAGNOSIS — I4729 Other ventricular tachycardia: Secondary | ICD-10-CM

## 2013-08-25 DIAGNOSIS — I2510 Atherosclerotic heart disease of native coronary artery without angina pectoris: Secondary | ICD-10-CM

## 2013-08-25 DIAGNOSIS — Z5181 Encounter for therapeutic drug level monitoring: Secondary | ICD-10-CM

## 2013-08-25 DIAGNOSIS — Z8679 Personal history of other diseases of the circulatory system: Secondary | ICD-10-CM

## 2013-08-25 DIAGNOSIS — M519 Unspecified thoracic, thoracolumbar and lumbosacral intervertebral disc disorder: Secondary | ICD-10-CM

## 2013-08-25 LAB — BASIC METABOLIC PANEL
BUN: 32 mg/dL — ABNORMAL HIGH (ref 6–23)
CO2: 30 meq/L (ref 19–32)
Calcium: 9.4 mg/dL (ref 8.4–10.5)
Chloride: 104 mEq/L (ref 96–112)
Creatinine, Ser: 1.79 mg/dL — ABNORMAL HIGH (ref 0.50–1.10)
GFR calc Af Amer: 30 mL/min — ABNORMAL LOW (ref >90)
GFR, EST NON AFRICAN AMERICAN: 26 mL/min — AB (ref >90)
GLUCOSE: 156 mg/dL — AB (ref 70–99)
Potassium: 4.6 mEq/L (ref 3.7–5.3)
Sodium: 145 mEq/L (ref 137–147)

## 2013-08-25 LAB — PROTIME-INR
INR: 2.14 — ABNORMAL HIGH (ref 0.00–1.49)
Prothrombin Time: 23.2 seconds — ABNORMAL HIGH (ref 11.6–15.2)

## 2013-08-25 MED ORDER — WARFARIN SODIUM 5 MG PO TABS
5.0000 mg | ORAL_TABLET | Freq: Once | ORAL | Status: AC
  Administered 2013-08-25: 5 mg via ORAL
  Filled 2013-08-25: qty 1

## 2013-08-25 MED ORDER — DIGOXIN 0.25 MG/ML IJ SOLN
0.5000 mg | Freq: Four times a day (QID) | INTRAMUSCULAR | Status: AC
  Administered 2013-08-25 – 2013-08-26 (×2): 0.5 mg via INTRAVENOUS
  Filled 2013-08-25 (×2): qty 2

## 2013-08-25 MED ORDER — DIGOXIN 125 MCG PO TABS
0.1250 mg | ORAL_TABLET | Freq: Every day | ORAL | Status: DC
  Administered 2013-08-26: 0.125 mg via ORAL
  Filled 2013-08-25 (×2): qty 1

## 2013-08-25 MED ORDER — DIGOXIN 0.25 MG/ML IJ SOLN
0.5000 mg | Freq: Once | INTRAMUSCULAR | Status: AC
  Administered 2013-08-25: 0.5 mg via INTRAVENOUS
  Filled 2013-08-25: qty 2

## 2013-08-25 MED ORDER — FUROSEMIDE 40 MG PO TABS
40.0000 mg | ORAL_TABLET | Freq: Two times a day (BID) | ORAL | Status: DC
  Administered 2013-08-25 – 2013-08-26 (×2): 40 mg via ORAL
  Filled 2013-08-25 (×2): qty 1

## 2013-08-25 MED ORDER — LISINOPRIL 5 MG PO TABS
2.5000 mg | ORAL_TABLET | Freq: Every day | ORAL | Status: DC
  Administered 2013-08-25 – 2013-08-26 (×2): 2.5 mg via ORAL
  Filled 2013-08-25 (×2): qty 1

## 2013-08-25 NOTE — Consult Note (Signed)
Consulting cardiologist: Purvis Sheffield  Subjective:      Objective:   Temp:  [98 F (36.7 C)-98.5 F (36.9 C)] 98 F (36.7 C) (02/16 0533) Pulse Rate:  [64-142] 106 (02/16 0533) Resp:  [20] 20 (02/16 0533) BP: (90-121)/(65-91) 106/73 mmHg (02/16 0533) SpO2:  [95 %-98 %] 97 % (02/16 0533) Weight:  [181 lb (82.1 kg)-183 lb 13.8 oz (83.4 kg)] 183 lb 13.8 oz (83.4 kg) (02/16 0533) Last BM Date: 08/24/13  Filed Weights   08/24/13 0516 08/25/13 0500 08/25/13 0533  Weight: 185 lb 6.5 oz (84.1 kg) 181 lb (82.1 kg) 183 lb 13.8 oz (83.4 kg)    Intake/Output Summary (Last 24 hours) at 08/25/13 0843 Last data filed at 08/24/13 2200  Gross per 24 hour  Intake    300 ml  Output    800 ml  Net   -500 ml   Echocardiogram: Left ventricle: The cavity size was normal. Wall thickness was normal. Systolic function was severely reduced. The estimated ejection fraction was in the range of 20% to 25%. Diffuse hypokinesis. Doppler parameters are consistent with restrictive physiology, indicative of decreased left ventricular diastolic compliance and/or increased left atrial pressure. - Aortic valve: Noncoronary cusp immobility was noted. Mild regurgitation directed centrally in the LVOT. - Mitral valve: Calcified annulus. Mild regurgitation directed centrally. - Left atrium: The atrium was severely dilated. - Right ventricle: Systolic function was reduced. - Right atrium: The atrium was severely dilated. - Tricuspid valve: Moderate regurgitation. - Pulmonary arteries: Systolic pressure was moderately increased. PA peak pressure: 85mm Hg (S). - Pericardium, extracardiac: A trivial pericardial effusion was identified posterior to the heart.   Telemetry:Atrial fibrillation rates 110-120 bpm  Exam:  General: No acute distress.  HEENT: Conjunctiva and lids normal, oropharynx clear.  Lungs:Bibasliar crackles, R>L. No wheezes or rhonchi.  Cardiac: No elevated JVP or bruits. IRRR,  tachycardic, no gallop or rub.   Abdomen: Normoactive bowel sounds, nontender, nondistended.  Extremities: 1+ pitting edema bilateral ankles., distal pulses full.  Neuropsychiatric: Alert and oriented x3, affect appropriate.   Lab Results:  Basic Metabolic Panel:  Recent Labs Lab 08/22/13 0441 08/23/13 0555 08/24/13 0524  NA 147 147 145  K 3.8 3.5* 3.9  CL 106 105 106  CO2 30 31 31   GLUCOSE 86 87 80  BUN 40* 38* 33*  CREATININE 2.29* 1.97* 1.67*  CALCIUM 9.5 9.7 9.3  MG 2.0  --   --     CBC:  Recent Labs Lab 08/21/13 1231 08/22/13 0441  WBC 8.5 7.3  HGB 12.5 11.2*  HCT 39.2 35.2*  MCV 91.2 91.4  PLT 174 188    Cardiac Enzymes:  Recent Labs Lab 08/21/13 1702 08/21/13 2255 08/22/13 0441  TROPONINI <0.30 <0.30 <0.30    BNP:  Recent Labs  01/05/13 1903 08/21/13 1220  PROBNP 3772.0* 8457.0*    Coagulation:  Recent Labs Lab 08/23/13 0555 08/24/13 0524 08/25/13 0455  INR 2.80* 2.57* 2.14*    Radiology: Chest X-Ray 08/21/2013 Enlargement of cardiac silhouette with pulmonary vascular congestion. Bronchitic changes with minimal right base atelectasis.    Medications:   Scheduled Medications: . antiseptic oral rinse  15 mL Mouth Rinse BID  . atorvastatin  40 mg Oral q1800  . carvedilol  6.25 mg Oral BID WC  . cephALEXin  250 mg Oral 3 times per day  . cholecalciferol  2,000 Units Oral Daily  . febuxostat  40 mg Oral Daily  . furosemide  40 mg Intravenous Daily  .  gabapentin  300 mg Oral QHS  . hydroxychloroquine  200 mg Oral BID  . potassium chloride  20 mEq Oral TID  . predniSONE  5 mg Oral Daily  . raloxifene  60 mg Oral Daily  . sodium chloride  3 mL Intravenous Q12H  . Warfarin - Pharmacist Dosing Inpatient   Does not apply Q24H     Infusions: . diltiazem (CARDIZEM) infusion 5 mg/hr (08/22/13 0600)     PRN Medications:  sodium chloride, bisacodyl, morphine injection, nitroGLYCERIN, ondansetron, sodium chloride,  traMADol   Assessment and Plan:   1. Atrial fib with RVR: She is now on coreg 6.25 mg BID, and cardizem drip. She has significant change in systolic function, with drop to 20-25%, from 40-45% in June of 2014. I will stop the cardizem drip and begin digoxin loading, watching her HR closely, as she has a history of tachy/brady syndrome. Would not go up on coreg to avoid hypotensive response. Continue warfarin.   2. Systolic CHF: She has diuresed and is breathing some better. Still with bibasilar crackles. Creatinine is 1.67 this am. Some component of cardio-renal syndrome is likely. Will continue IV lasix for now, carvedilol, consider adding low dose ACE.  3.  CAD: Cardiac cath June of 2014. Significant 1 vessel coronary artery disease with diffuse 95% stenosis in proximal OM 3. The affected area is significantly tortuous and the distal vessel distribution is relatively small in size. Thus, risks of PCI outweigh the benefits. Would not pursue repeat cardiac cath.   4. Hypertension: Low normal levels, due to systolic dysfunction.      Marissa [name]. [name] NP [facility] 08/25/2013, 8:43 AM

## 2013-08-25 NOTE — Evaluation (Signed)
Physical Therapy Evaluation Patient Details Name: Marissa [name] MRN: [id number] DOB: 12-24-1932 Today's Date: 08/25/2013 Time: 9924-2683 PT Time Calculation (min): 37 min  PT Assessment / Plan / Recommendation History of Present Illness  Pt is admitted with acute on chronic CHF.  She reports having had a compression fx of the lumbar spine in early December 2014.  She developed radiation of lumbar pain to the LLE for which she had an MRI of the lumbar spine.  This revealed severe spinal stensis at L3.  Daughter states that pt has been on bedrest for the past 5 weeks due to this finding, ordered by Dr. Bettina Gavia, neurosurgeon.  She has a lumbar corset but has only been allowed to transfer bed to [facility].  Pt lives alone and has had a paid CG from 8-6PM, then is alone.  Pt is concerned that she has become very weak with prolonged bedrest and feels that she might need to go to SNF.  Clinical Impression   Pt was seen for a limited evaluation while in bed.  She is currently on supplemental O2 (not usual for pt) and very comfortable.  She is alert, oriented and very cooperative.  She is generally deconditioned with fatigue of the LLE with minimal exercise.  We initiated therapeutic exercise for posture and LE strengthening.  She tolerated this well with no pain.  We are awaiting instruction from Dr. Juanetta Gosling in order to determine if we can progress pt off of bedrest.  He has placed a query to Dr. Bettina Gavia for recommendations.  Once we understand pt's mobility status, we will be able to assist with recommendations for discharge planning.    PT Assessment  Patient needs continued PT services    Follow Up Recommendations   (to be determined)    Does the patient have the potential to tolerate intense rehabilitation      Barriers to Discharge Decreased caregiver support      Equipment Recommendations  None recommended by PT    Recommendations for Other Services     Frequency Min 3X/week     Precautions / Restrictions Precautions Precautions: Fall Precaution Comments: on bedrest, per daughter's report Restrictions Weight Bearing Restrictions: Yes Other Position/Activity Restrictions: bedrest   Pertinent Vitals/Pain       Mobility  Bed Mobility General bed mobility comments: not assessed    Exercises General Exercises - Lower Extremity Ankle Circles/Pumps: AROM;Both;10 reps;Supine Quad Sets: AROM;Both;10 reps;Supine Gluteal Sets: AROM;Both;10 reps;Supine Short Arc Quad: AROM;Both;20 reps;Supine Heel Slides: AROM;Both;10 reps;Supine Hip ABduction/ADduction: AROM;Both;10 reps;Supine Other Exercises Other Exercises: scapular retraction x 8 repetitions Other Exercises: pectoral stretch x 5 repetitions   PT Diagnosis: Difficulty walking;Generalized weakness  PT Problem List: Decreased strength;Decreased activity tolerance;Decreased mobility PT Treatment Interventions: Therapeutic exercise (to be edited if we can mobilize pt)     PT Goals(Current goals can be found in the care plan section) Acute Rehab PT Goals Patient Stated Goal: to get stronger PT Goal Formulation: With patient/family Time For Goal Achievement: 09/01/13 Potential to Achieve Goals: Good  Visit Information  Last PT Received On: 08/25/13 History of Present Illness: Pt is admitted with acute on chronic CHF.  She reports having had a compression fx of the lumbar spine in early December 2014.  She developed radiation of lumbar pain to the LLE for which she had an MRI of the lumbar spine.  This revealed severe spinal stensis at L3.  Daughter states that pt has been on bedrest for the past 5 weeks due to  this finding, ordered by Dr. Bettina Gavia, neurosurgeon.  She has a lumbar corset but has only been allowed to transfer bed to [facility].  Pt lives alone and has had a paid CG from 8-6PM, then is alone.  Pt is concerned that she has become very weak with prolonged bedrest and feels that she might need to go to  SNF.       Prior Functioning  Home Living Family/patient expects to be discharged to:: Skilled nursing facility Prior Function Level of Independence: Independent with assistive device(s) Communication Communication: No difficulties    Cognition  Cognition Arousal/Alertness: Awake/alert Behavior During Therapy: WFL for tasks assessed/performed Overall Cognitive Status: Within Functional Limits for tasks assessed    Extremity/Trunk Assessment Lower Extremity Assessment Lower Extremity Assessment: Generalized weakness (LLE is 1/2 grade weaker than RLE (3-/5))   Balance    End of Session PT - End of Session Activity Tolerance: Patient tolerated treatment well Patient left: in bed;with call bell/phone within reach;with bed alarm set  GP     Konrad Penta 08/25/2013, 3:13 PM

## 2013-08-25 NOTE — Progress Notes (Signed)
[name], Marissa [name]                ACCOUNT NO.:  [id number]  MEDICAL RECORD NO.:  [id number]  LOCATION:                                 FACILITY:  PHYSICIAN:  Kingsley Callander. [name], MD       DATE OF BIRTH:  02-Nov-1932  DATE OF PROCEDURE:  08/23/2013 DATE OF DISCHARGE:                                PROGRESS NOTE   [name] is breathing more comfortably.  She denies any symptoms of palpitations.  She was successfully weaned from intravenous diltiazem yesterday and was moved to the 3rd floor.  She diuresed 1150 mL yesterday.  OBJECTIVE:  VITAL SIGNS:  Temperature 98.3, heart rate is in the 90s in AFib on telemetry, respirations 16, blood pressure 107/71, oxygen saturation 94%. LUNGS:  Clear. HEART:  Irregularly irregular. ABDOMEN:  Soft and nontender. EXTREMITIES:  Reveal no edema.  IMPRESSION AND PLAN: 1. Acute on chronic systolic heart failure, improving continue IV     Lasix. 2. Chronic kidney disease, stage IV.  BUN and creatinine are improved     at 38 and 1.97. 3. Hypokalemia.  Serum potassium is dropped to 3.5.  Her potassium     dose will be increased. 4. Coronary artery disease, stable. 5. Rheumatoid arthritis, stable on Plaquenil and prednisone. 6. Chronic atrial fibrillation.  Continue Coumadin.  Her INR is     therapeutic today, dosing per pharmacy.  Rate is satisfactorily     controlled with carvedilol. 7. Her repeat echo is now available.  Her ejection fraction is 20-25%     with diffuse hypokinesis. 8. Urinary tract infection.  Her urine culture reveals greater than     100,000 colonies per mL of E. coli.  This is quinolone resistant,     but cephalosporin sensitive.  She is allergic to PENICILLIN which     caused a rash.  She will be treated with cephalexin.     Kingsley Callander. [name], MD     ROF/MEDQ  D:  08/23/2013  T:  08/23/2013  Job:  [id number]

## 2013-08-25 NOTE — Clinical Social Work Psychosocial (Signed)
Clinical Social Work Department BRIEF PSYCHOSOCIAL ASSESSMENT 08/25/2013  Patient:  Marissa [name], Marissa [name]     Account Number:  [id number]     Admit date:  08/21/2013  Clinical Social Worker:  Wyatt Haste  Date/Time:  08/25/2013 03:18 PM  Referred by:  Physician  Date Referred:  08/25/2013 Referred for  SNF Placement   Other Referral:   Interview type:  Patient Other interview type:   and daughter    PSYCHOSOCIAL DATA Living Status:  ALONE Admitted from facility:   Level of care:   Primary support name:  Sheryl Primary support relationship to patient:  CHILD, ADULT Degree of support available:   supportive    CURRENT CONCERNS Current Concerns  Post-Acute Placement   Other Concerns:    SOCIAL WORK ASSESSMENT / PLAN CSW met with pt and pt's daughter at bedside. Pt alert and oriented and reports she came to ED with SOB. Admitted with CHF. Pt has been living at home alone, but has hired a Theatre stage manager from 9-6 daily. Pt had recent L5 fracture in December and has been on bed rest since that time. She has been able to get up to [facility] and sit on edge of bed but that is all. Prior to this, pt was completely independent. 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 Pt admits that this has been a difficult adjustment for her. Pt's daughter reports concern about pt returning home and being alone at night due to current medical issues. Pt would prefer to return home, but is willing to consider SNF, particularly with daughter's encouragement. Still awaiting decision if pt will need to remain on bed rest. Aware of Medicare coverage/criteria and that if pt remains on bed rest, she may only qualify for [facility] Medicare for a short period. SNF list provided. Pt agreeable to faxing out information in [place] only. Will consider all options after decision is made about bed rest and PT recommendations are made.   Assessment/plan status:  Psychosocial Support/Ongoing Assessment of  Needs Other assessment/ plan:   Information/referral to community resources:   SNF list    PATIENT'S/FAMILY'S RESPONSE TO PLAN OF CARE: Pt more hesitant to consider SNF but daughter feels it is necessary. Pt is agreeable to initiate bed search and CSW will follow up tomorrow when hopefully more information is available for pt to make decision.       Benay Pike, [place]

## 2013-08-25 NOTE — Progress Notes (Signed)
Marissa [name], Marissa [name]                ACCOUNT NO.:  [id number]  MEDICAL RECORD NO.:  [id number]  LOCATION:                                 FACILITY:  PHYSICIAN:  Kingsley Callander. [name], MD       DATE OF BIRTH:  1932-10-17  DATE OF PROCEDURE:  08/24/2013 DATE OF DISCHARGE:                                PROGRESS NOTE   [name] says her shortness of breath is improved.  She is not having significant palpitations.  Her daughter states that she is too weak to go home and care for herself.  She has a lumbar fracture and has been on bed rest.  OBJECTIVE:  VITAL SIGNS:  Temperature 98.3, pulse 85, respirations 20, blood pressure 111/72. LUNGS:  Basilar rales. HEART:  Irregularly irregular. ABDOMEN:  Soft and nontender. EXTREMITIES:  No edema.  IMPRESSION AND PLAN: 1. Acute on chronic systolic heart failure.  Continue IV Lasix. 2. Chronic atrial fibrillation.  Continue carvedilol.  INR is at 2.57. 3. Lumbar fracture.  It appears that the patient and family need     skilled nursing at this point. 4. Chronic kidney disease.  BUN and creatinine are improved to 33 and     1.67, potassium is 3.9.     Kingsley Callander. [name], MD     ROF/MEDQ  D:  08/24/2013  T:  08/24/2013  Job:  [id number]

## 2013-08-25 NOTE — Clinical Social Work Placement (Signed)
Clinical Social Work Department CLINICAL SOCIAL WORK PLACEMENT NOTE 08/25/2013  Patient:  Marissa [name], Marissa [name]  Account Number:  [id number] Admit date:  08/21/2013  Clinical Social Worker:  Derenda Fennel, LCSW  Date/time:  08/25/2013 03:17 PM  Clinical Social Work is seeking post-discharge placement for this patient at the following level of care:   SKILLED NURSING   (*CSW will update this form in Epic as items are completed)   08/25/2013  Patient/family provided with [facility] Department of Clinical Social Work's list of facilities offering this level of care within the geographic area requested by the patient (or if unable, by the patient's family).  08/25/2013  Patient/family informed of their freedom to choose among providers that offer the needed level of care, that participate in Medicare, Medicaid or managed care program needed by the patient, have an available bed and are willing to accept the patient.  08/25/2013  Patient/family informed of [facility]' ownership interest in [facility], as well as of the fact that they are under no obligation to receive care at this facility.  PASARR submitted to EDS on 08/25/2013 PASARR number received from EDS on 08/25/2013  FL2 transmitted to all facilities in geographic area requested by pt/family on  08/25/2013 FL2 transmitted to all facilities within larger geographic area on   Patient informed that his/her managed care company has contracts with or will negotiate with  certain facilities, including the following:     Patient/family informed of bed offers received:   Patient chooses bed at  Physician recommends and patient chooses bed at    Patient to be transferred to  on   Patient to be transferred to facility by   The following physician request were entered in Epic:   Additional Comments:  Derenda Fennel, LCSW [phone]

## 2013-08-25 NOTE — Progress Notes (Signed)
ANTICOAGULATION CONSULT NOTE  Pharmacy Consult for Warfarin Indication: atrial fibrillation  Allergies  Allergen Reactions  . Codeine Other (See Comments)    Makes patient sleepy.  Marland Kitchen Penicillins Rash   Patient Measurements: Height: 5\' 5"  (165.1 cm) Weight: 183 lb 13.8 oz (83.4 kg) IBW/kg (Calculated) : 57  Vital Signs: Temp: 98 F (36.7 C) (02/16 0533) Temp src: Oral (02/16 0533) BP: 106/73 mmHg (02/16 0533) Pulse Rate: 106 (02/16 0533)  Labs:  Recent Labs  08/23/13 0555 08/24/13 0524 08/25/13 0455  LABPROT 28.5* 26.7* 23.2*  INR 2.80* 2.57* 2.14*  CREATININE 1.97* 1.67*  --    Estimated Creatinine Clearance: 29.6 ml/min (by C-G formula based on Cr of 1.67).  Medical History: Past Medical History  Diagnosis Date  . Permanent atrial fibrillation     A.  Chronic Coumadin  . MVP (mitral valve prolapse)   . Essential hypertension, benign   . Nonischemic cardiomyopathy 2001    A.  07/23/11 - Echo: EF 35%;  B. 07/24/2011 - Cath: nonobs dzs ef 35%, 2+MR C. Echo 01/06/13: EF 40-45%, mild LVH, mlid MR, mod biatrial enlargment, mild RV dilatation, mod TR, PASP 54 mmHg  . Systolic CHF, chronic     A. Diag 07/2011, EF 35%  . Lumbar disc disease     Laminectomy 2001  . History of endometrial cancer   . Hyperlipidemia   . Rheumatoid arthritis     Treated in past with Remicade and developed vasculitis  . Gout   . PONV (postoperative nausea and vomiting)   . NSTEMI (non-ST elevated myocardial infarction) 12/2012  . GERD (gastroesophageal reflux disease)   . Coronary artery dissection 12/2012    95% OM3 hazy lesion on cath, medically managed   Medications:  Scheduled:  . antiseptic oral rinse  15 mL Mouth Rinse BID  . atorvastatin  40 mg Oral q1800  . carvedilol  6.25 mg Oral BID WC  . cephALEXin  250 mg Oral 3 times per day  . cholecalciferol  2,000 Units Oral Daily  . digoxin  0.5 mg Intravenous Once   Followed by  . digoxin  0.5 mg Intravenous 4 times per day  .  [START ON 08/26/2013] digoxin  0.125 mg Oral Daily  . febuxostat  40 mg Oral Daily  . furosemide  40 mg Intravenous Daily  . gabapentin  300 mg Oral QHS  . hydroxychloroquine  200 mg Oral BID  . lisinopril  2.5 mg Oral Daily  . potassium chloride  20 mEq Oral TID  . predniSONE  5 mg Oral Daily  . raloxifene  60 mg Oral Daily  . sodium chloride  3 mL Intravenous Q12H  . warfarin  5 mg Oral Once  . Warfarin - Pharmacist Dosing Inpatient   Does not apply Q24H   Assessment: Continuation of chronic warfarin PTA for atrial fibrillation. INR was supra-therapeutic on admission. PTA dose listed as: Warfarin 5 mg Tuesday, Wednesday, Friday, Saturday, Sunday: 2.5 mg on Monday, Thursday INR is within goal range but is trending down.  Home dose may need adjustment.  Goal of Therapy:  INR 2-3   Plan:  Warfarin 5mg  PO x 1 today to boost INR INR/PT daily Labs per protocol  Friday A 08/25/2013,10:05 AM

## 2013-08-25 NOTE — Consult Note (Signed)
The patient was seen and examined, and I agree with the assessment and plan as documented above, with modifications as noted below. Pt is known to me from the clinic. She is medically complicated with a h/o CAD, NSVT, permanent atrial fibrillation, chronic systolic heart failure, and tachy-brady syndrome. She has been admitted with acute on chronic systolic heart failure, and an echocardiogram performed during this hospitalization revealed a severely reduced EF, 20-25%, previously 40-45%. I suspect this is likely tachycardia-mediated. She had been on Toprol-XL in the past as well as digoxin, but this led to pauses and episodic junctional rhythm, all resolved with discontinuation of these meds. She's presently tolerating carvedilol and a trial of digoxin has been reinitiated. I had a long discussion with both the patient and her daughter, Marissa [name]. Unfortunately, she has CKD as well. I would recommend that she wear a 7-day event monitor at the time of discharge to see if her HR is controlled. If she develops significant bradyarrhythmias or pauses, she may require a pacemaker so that beta blockers can effectively be used. Given her CKD, digoxin therapy will need to be closely monitored. Her BP limits the ability to increase the dose of carvedilol at present. If she did end up requiring a pacemaker, I would suggest switching to Toprol-XL for rate control, as this would also theoretically help to improve LV systolic function. Low-dose ACEI has also been started, but will need to be monitored given CKD and hypotension. She has been on losartan as an outpatient. Given her known h/o CAD, I would also recommend a nuclear stress test to evaluate for an ischemic etiology with respect to her decline in EF. All of these issues were discussed with the patient and her daughter. She would be willing to undergo coronary angiography (if need be), but prefers the femoral approach. I will check a BMET as one hasn't been  drawn today to assess renal function and potassium. I will also d/c IV Lasix and switch to oral 40 mg bid. Continue warfarin and Lipitor.

## 2013-08-25 NOTE — Progress Notes (Signed)
Subjective: She says she feels better. She has no new complaints. Her daughter reports that she is supposed to be at bedrest because of a fracture in her spine. The concern is that she may need rehabilitation but that cannot be done if she is on bed rest. Her breathing has improved.  Objective: Vital signs in last 24 hours: Temp:  [98 F (36.7 C)-98.5 F (36.9 C)] 98 F (36.7 C) (02/16 0533) Pulse Rate:  [64-142] 106 (02/16 0533) Resp:  [20] 20 (02/16 0533) BP: (90-121)/(65-91) 106/73 mmHg (02/16 0533) SpO2:  [95 %-98 %] 97 % (02/16 0533) Weight:  [82.1 kg (181 lb)-83.4 kg (183 lb 13.8 oz)] 83.4 kg (183 lb 13.8 oz) (02/16 0533) Weight change: -2 kg (-4 lb 6.6 oz) Last BM Date: 08/24/13  Intake/Output from previous day: 02/15 0701 - 02/16 0700 In: 300 [P.O.:300] Out: 800 [Urine:800]  PHYSICAL EXAM General appearance: alert, cooperative and no distress Resp: clear to auscultation bilaterally Cardio: irregularly irregular rhythm GI: soft, non-tender; bowel sounds normal; no masses,  no organomegaly Extremities: extremities normal, atraumatic, no cyanosis or edema  Lab Results:    Basic Metabolic Panel:  Recent Labs  40/98/11 0555 08/24/13 0524  NA 147 145  K 3.5* 3.9  CL 105 106  CO2 31 31  GLUCOSE 87 80  BUN 38* 33*  CREATININE 1.97* 1.67*  CALCIUM 9.7 9.3   Liver Function Tests: No results found for this basename: AST, ALT, ALKPHOS, BILITOT, PROT, ALBUMIN,  in the last 72 hours No results found for this basename: LIPASE, AMYLASE,  in the last 72 hours No results found for this basename: AMMONIA,  in the last 72 hours CBC: No results found for this basename: WBC, NEUTROABS, HGB, HCT, MCV, PLT,  in the last 72 hours Cardiac Enzymes: No results found for this basename: CKTOTAL, CKMB, CKMBINDEX, TROPONINI,  in the last 72 hours BNP: No results found for this basename: PROBNP,  in the last 72 hours D-Dimer: No results found for this basename: DDIMER,  in the last  72 hours CBG: No results found for this basename: GLUCAP,  in the last 72 hours Hemoglobin A1C: No results found for this basename: HGBA1C,  in the last 72 hours Fasting Lipid Panel: No results found for this basename: CHOL, HDL, LDLCALC, TRIG, CHOLHDL, LDLDIRECT,  in the last 72 hours Thyroid Function Tests: No results found for this basename: TSH, T4TOTAL, FREET4, T3FREE, THYROIDAB,  in the last 72 hours Anemia Panel: No results found for this basename: VITAMINB12, FOLATE, FERRITIN, TIBC, IRON, RETICCTPCT,  in the last 72 hours Coagulation:  Recent Labs  08/24/13 0524 08/25/13 0455  LABPROT 26.7* 23.2*  INR 2.57* 2.14*   Urine Drug Screen: Drugs of Abuse  No results found for this basename: labopia, cocainscrnur, labbenz, amphetmu, thcu, labbarb    Alcohol Level: No results found for this basename: ETH,  in the last 72 hours Urinalysis: No results found for this basename: COLORURINE, APPERANCEUR, LABSPEC, PHURINE, GLUCOSEU, HGBUR, BILIRUBINUR, KETONESUR, PROTEINUR, UROBILINOGEN, NITRITE, LEUKOCYTESUR,  in the last 72 hours Misc. Labs:  ABGS No results found for this basename: PHART, PCO2, PO2ART, TCO2, HCO3,  in the last 72 hours CULTURES Recent Results (from the past 240 hour(s))  URINE CULTURE     Status: None   Collection Time    08/21/13  2:35 PM      Result Value Ref Range Status   Specimen Description URINE, CATHETERIZED   Final   Special Requests NONE   Final  Culture  Setup Time     Final   Value: 08/21/2013 16:16     Performed at [facility] Count     Final   Value: >=100,000 COLONIES/ML     Performed at [facility]   Culture     Final   Value: ESCHERICHIA COLI     Performed at [facility]   Report Status 08/23/2013 FINAL   Final   Organism ID, Bacteria ESCHERICHIA COLI   Final  MRSA PCR SCREENING     Status: None   Collection Time    08/21/13  4:36 PM      Result Value Ref Range Status   MRSA by PCR NEGATIVE   NEGATIVE Final   Comment:            The GeneXpert MRSA Assay (FDA     approved for NASAL specimens     only), is one component of a     comprehensive MRSA colonization     surveillance program. It is not     intended to diagnose MRSA     infection nor to guide or     monitor treatment for     MRSA infections.   Studies/Results: No results found.  Medications:  Prior to Admission:  Prescriptions prior to admission  Medication Sig Dispense Refill  . atorvastatin (LIPITOR) 40 MG tablet TAKE ONE TABLET BY MOUTH ONCE DAILY.  30 tablet  3  . carvedilol (COREG) 6.25 MG tablet TAKE 1 TABLET BY MOUTH TWICE DAILY WITH MEALS.  60 tablet  2  . Cholecalciferol (VITAMIN D) 2000 UNITS CAPS Take 2,000 Units by mouth daily.      . febuxostat (ULORIC) 40 MG tablet Take 40 mg by mouth daily.      . furosemide (LASIX) 40 MG tablet Take 1 tablet (40 mg total) by mouth daily.  30 tablet  6  . gabapentin (NEURONTIN) 300 MG capsule Take 300 mg by mouth at bedtime.      . hydroxychloroquine (PLAQUENIL) 200 MG tablet Take 1 tablet by mouth Twice daily.      . Hypromellose (GENTEAL OP) Place 1 drop into both eyes daily as needed. Tired Eyes      . losartan (COZAAR) 50 MG tablet TAKE ONE TABLET BY MOUTH ONCE DAILY.  30 tablet  6  . nitroGLYCERIN (NITROSTAT) 0.4 MG SL tablet Place 1 tablet (0.4 mg total) under the tongue every 5 (five) minutes as needed for chest pain.  25 tablet  12  . potassium chloride (K-DUR) 10 MEQ tablet Take 10 mEq by mouth 2 (two) times daily.      . predniSONE (DELTASONE) 5 MG tablet Take 5 mg by mouth daily.      . raloxifene (EVISTA) 60 MG tablet Take 60 mg by mouth daily.      . traMADol (ULTRAM) 50 MG tablet Take 50 mg by mouth daily as needed.       . warfarin (COUMADIN) 5 MG tablet Take 1 tablet daily except 1/2 tablet on Mondays and Thursdays  30 tablet  6   Scheduled: . antiseptic oral rinse  15 mL Mouth Rinse BID  . atorvastatin  40 mg Oral q1800  . carvedilol  6.25 mg  Oral BID WC  . cephALEXin  250 mg Oral 3 times per day  . cholecalciferol  2,000 Units Oral Daily  . febuxostat  40 mg Oral Daily  . furosemide  40 mg Intravenous Daily  .  gabapentin  300 mg Oral QHS  . hydroxychloroquine  200 mg Oral BID  . potassium chloride  20 mEq Oral TID  . predniSONE  5 mg Oral Daily  . raloxifene  60 mg Oral Daily  . sodium chloride  3 mL Intravenous Q12H  . Warfarin - Pharmacist Dosing Inpatient   Does not apply Q24H   Continuous: . diltiazem (CARDIZEM) infusion 5 mg/hr (08/22/13 0600)   ONG:EXBMWU chloride, bisacodyl, morphine injection, nitroGLYCERIN, ondansetron, sodium chloride, traMADol  Assesment: She was admitted with an acute exacerbation of chronic systolic heart failure. She is symptomatically improved but her ejection fraction is down to 20-25%. She also had rapid atrial fibrillation and that is improved also.  She has deconditioning and has been at essentially bed rest for approximately 6 weeks  She has a fracture in the spine.   Active Problems:   Acute exacerbation of CHF (congestive heart failure)    Plan: I'm going to discuss her situation with her neurosurgeon and see if she really needs to be at bed rest or whether we can proceed with potential rehabilitation. She has a back brace that she can use.    LOS: 4 days   Brandee Markin L 08/25/2013, 8:47 AM

## 2013-08-26 ENCOUNTER — Inpatient Hospital Stay
Admission: RE | Admit: 2013-08-26 | Discharge: 2013-10-15 | Disposition: A | Discharge status: Home / Self Care | Payer: Medicare Other | Source: Ambulatory Visit | Attending: Internal Medicine | Admitting: Internal Medicine

## 2013-08-26 DIAGNOSIS — R109 Unspecified abdominal pain: Secondary | ICD-10-CM

## 2013-08-26 DIAGNOSIS — R110 Nausea: Principal | ICD-10-CM

## 2013-08-26 LAB — BASIC METABOLIC PANEL
BUN: 29 mg/dL — ABNORMAL HIGH (ref 6–23)
CHLORIDE: 106 meq/L (ref 96–112)
CO2: 31 meq/L (ref 19–32)
CREATININE: 1.72 mg/dL — AB (ref 0.50–1.10)
Calcium: 9.3 mg/dL (ref 8.4–10.5)
GFR calc Af Amer: 32 mL/min — ABNORMAL LOW (ref >90)
GFR calc non Af Amer: 27 mL/min — ABNORMAL LOW (ref >90)
Glucose, Bld: 75 mg/dL (ref 70–99)
Potassium: 4.4 mEq/L (ref 3.7–5.3)
SODIUM: 146 meq/L (ref 137–147)

## 2013-08-26 LAB — PROTIME-INR
INR: 1.92 — ABNORMAL HIGH (ref 0.00–1.49)
Prothrombin Time: 21.4 seconds — ABNORMAL HIGH (ref 11.6–15.2)

## 2013-08-26 MED ORDER — FUROSEMIDE 40 MG PO TABS: 40.0000 mg | ORAL_TABLET | Freq: Two times a day (BID) | ORAL | Status: DC

## 2013-08-26 MED ORDER — DIGOXIN 125 MCG PO TABS: 0.1250 mg | ORAL_TABLET | Freq: Every day | ORAL | Status: AC

## 2013-08-26 MED ORDER — CEPHALEXIN 250 MG PO CAPS: 250.0000 mg | ORAL_CAPSULE | Freq: Three times a day (TID) | ORAL | Status: DC

## 2013-08-26 MED ORDER — TRAMADOL HCL 50 MG PO TABS: 50.0000 mg | ORAL_TABLET | Freq: Four times a day (QID) | ORAL | Status: DC | PRN

## 2013-08-26 NOTE — Progress Notes (Signed)
Physical Therapy Treatment Patient Details Name: Marissa [name] MRN: [id number] DOB: 02-Nov-1932 Today's Date: 08/26/2013 Time: 9562-1308 PT Time Calculation (min): 31 min  PT Assessment / Plan / Recommendation  History of Present Illness We have received word from Dr. Bettina Gavia via Dr. Juanetta Gosling that pt is now allowed to be OOB wearing her lumbar brace.  She is not allowed to do any twisting of the trunk, trunk flexion or lifting.   PT Comments   Pt is delighted that she is no longer on bedrest.  She is anxious to get strong enough to be able to return home ASAP.  She is able to tolerate supine LE strengthening exercise with minimal assist.  She has a written HEP.  Pt is able to don her lumbar brace with no assist (done standing).  She now needs a walker for gait and gait endurance is poor.  Gait is very slow and labored with evidence of trunk weakness during gait.  She had no pain with exercise or gait.   She will be very appropriate for SNF.  Follow Up Recommendations  SNF     Does the patient have the potential to tolerate intense rehabilitation     Barriers to Discharge        Equipment Recommendations  None recommended by PT    Recommendations for Other Services    Frequency     Progress towards PT Goals Progress towards PT goals: Progressing toward goals  Plan Discharge plan needs to be updated    Precautions / Restrictions Precautions Required Braces or Orthoses: Other Brace/Splint Other Brace/Splint: lumbar brace when OOB Restrictions Weight Bearing Restrictions: No Other Position/Activity Restrictions: no longer on bedrest   Pertinent Vitals/Pain     Mobility  Bed Mobility Overal bed mobility: Modified Independent Transfers Overall transfer level: Needs assistance Equipment used: Rolling walker (2 wheeled) Transfers: Sit to/from Stand Sit to Stand: Min assist Ambulation/Gait Ambulation/Gait assistance: Min assist Ambulation Distance (Feet): 30 Feet Assistive  device: Rolling walker (2 wheeled) Gait Pattern/deviations: Trunk flexed Gait velocity interpretation: Below normal speed for age/gender General Gait Details: gait is very labored...instruction for pt to increase thoracic extension but this is extremely difficult for her to maintain for more than a second or two.    Exercises General Exercises - Lower Extremity Ankle Circles/Pumps: AROM;Both;15 reps;Supine Quad Sets: AROM;Both;10 reps;Supine Gluteal Sets: AROM;Both;10 reps;Supine Short Arc Quad: AROM;Both;20 reps;Supine Heel Slides: AROM;Both;10 reps;Supine   PT Diagnosis:    PT Problem List:   PT Treatment Interventions:     PT Goals (current goals can now be found in the care plan section)    Visit Information  Last PT Received On: 08/26/13 History of Present Illness: We have received word from Dr. Bettina Gavia via Dr. Juanetta Gosling that pt is now allowed to be OOB wearing her lumbar brace.  She is not allowed to do any twisting of the trunk, trunk flexion or lifting.    Subjective Data      Cognition  Cognition Arousal/Alertness: Awake/alert Behavior During Therapy: WFL for tasks assessed/performed Overall Cognitive Status: Within Functional Limits for tasks assessed    Balance  Balance Overall balance assessment: No apparent balance deficits (not formally assessed)  End of Session PT - End of Session Equipment Utilized During Treatment: Gait belt Activity Tolerance: Patient tolerated treatment well Patient left: in bed;with call bell/phone within reach;with chair alarm set Nurse Communication: Mobility status   GP     Myrlene Broker L 08/26/2013, 11:06 AM

## 2013-08-26 NOTE — Progress Notes (Signed)
Subjective: She says she feels fairly well today. I did discuss her situation with her neurosurgeon and he says it's okay for her to be up with her brace on but she should avoid bending lifting or twisting.  Objective: Vital signs in last 24 hours: Temp:  [98 F (36.7 C)-98.5 F (36.9 C)] 98 F (36.7 C) (02/17 0620) Pulse Rate:  [75-104] 75 (02/17 0620) Resp:  [18-20] 18 (02/17 0620) BP: (109-120)/(56-71) 109/56 mmHg (02/17 0620) SpO2:  [96 %-100 %] 96 % (02/17 0620) Weight:  [82.9 kg (182 lb 12.2 oz)] 82.9 kg (182 lb 12.2 oz) (02/17 0505) Weight change: 0.8 kg (1 lb 12.2 oz) Last BM Date: 08/25/13  Intake/Output from previous day: 02/16 0701 - 02/17 0700 In: 840 [P.O.:840] Out: 2200 [Urine:2200]  PHYSICAL EXAM General appearance: alert, cooperative and no distress Resp: clear to auscultation bilaterally Cardio: irregularly irregular rhythm GI: soft, non-tender; bowel sounds normal; no masses,  no organomegaly Extremities: extremities normal, atraumatic, no cyanosis or edema  Lab Results:    Basic Metabolic Panel:  Recent Labs  34/19/62 1631 08/26/13 0457  NA 145 146  K 4.6 4.4  CL 104 106  CO2 30 31  GLUCOSE 156* 75  BUN 32* 29*  CREATININE 1.79* 1.72*  CALCIUM 9.4 9.3   Liver Function Tests: No results found for this basename: AST, ALT, ALKPHOS, BILITOT, PROT, ALBUMIN,  in the last 72 hours No results found for this basename: LIPASE, AMYLASE,  in the last 72 hours No results found for this basename: AMMONIA,  in the last 72 hours CBC: No results found for this basename: WBC, NEUTROABS, HGB, HCT, MCV, PLT,  in the last 72 hours Cardiac Enzymes: No results found for this basename: CKTOTAL, CKMB, CKMBINDEX, TROPONINI,  in the last 72 hours BNP: No results found for this basename: PROBNP,  in the last 72 hours D-Dimer: No results found for this basename: DDIMER,  in the last 72 hours CBG: No results found for this basename: GLUCAP,  in the last 72  hours Hemoglobin A1C: No results found for this basename: HGBA1C,  in the last 72 hours Fasting Lipid Panel: No results found for this basename: CHOL, HDL, LDLCALC, TRIG, CHOLHDL, LDLDIRECT,  in the last 72 hours Thyroid Function Tests: No results found for this basename: TSH, T4TOTAL, FREET4, T3FREE, THYROIDAB,  in the last 72 hours Anemia Panel: No results found for this basename: VITAMINB12, FOLATE, FERRITIN, TIBC, IRON, RETICCTPCT,  in the last 72 hours Coagulation:  Recent Labs  08/25/13 0455 08/26/13 0457  LABPROT 23.2* 21.4*  INR 2.14* 1.92*   Urine Drug Screen: Drugs of Abuse  No results found for this basename: labopia, cocainscrnur, labbenz, amphetmu, thcu, labbarb    Alcohol Level: No results found for this basename: ETH,  in the last 72 hours Urinalysis: No results found for this basename: COLORURINE, APPERANCEUR, LABSPEC, PHURINE, GLUCOSEU, HGBUR, BILIRUBINUR, KETONESUR, PROTEINUR, UROBILINOGEN, NITRITE, LEUKOCYTESUR,  in the last 72 hours Misc. Labs:  ABGS No results found for this basename: PHART, PCO2, PO2ART, TCO2, HCO3,  in the last 72 hours CULTURES Recent Results (from the past 240 hour(s))  URINE CULTURE     Status: None   Collection Time    08/21/13  2:35 PM      Result Value Ref Range Status   Specimen Description URINE, CATHETERIZED   Final   Special Requests NONE   Final   Culture  Setup Time     Final   Value: 08/21/2013 16:16  Performed at [facility] Count     Final   Value: >=100,000 COLONIES/ML     Performed at [facility]   Culture     Final   Value: ESCHERICHIA COLI     Performed at [facility]   Report Status 08/23/2013 FINAL   Final   Organism ID, Bacteria ESCHERICHIA COLI   Final  MRSA PCR SCREENING     Status: None   Collection Time    08/21/13  4:36 PM      Result Value Ref Range Status   MRSA by PCR NEGATIVE  NEGATIVE Final   Comment:            The GeneXpert MRSA Assay (FDA      approved for NASAL specimens     only), is one component of a     comprehensive MRSA colonization     surveillance program. It is not     intended to diagnose MRSA     infection nor to guide or     monitor treatment for     MRSA infections.   Studies/Results: Dg Lumbar Spine Complete  08/25/2013   CLINICAL DATA:  Lumbar spine fracture  EXAM: LUMBAR SPINE - COMPLETE 4+ VIEW  COMPARISON:  07/16/2013 ; correlation MRI lumbar spine 07/09/2013  FINDINGS: Five non-rib-bearing lumbar vertebrae.  Diffuse osseous demineralization.  Diffuse disc space narrowing and endplate spur formation throughout lumbar spine with biconvex thoracolumbar scoliosis.  Chronic height loss identified at the L4 vertebral body similar to previous exam.  Height loss is also present at the L3 vertebral body, predominately centrally, similar to prior MR.  Facet degenerative changes throughout lumbar region.  SI joints grossly symmetric.  Numerous pelvic phleboliths.  IMPRESSION: Significant degenerative disc and facet disease changes of the lumbar spine associated with osseous demineralization and biconvex scoliosis.  Chronic height losses of the L4 and to a lesser extent L3 vertebral bodies similar to previous exam.  No new abnormalities.   Electronically Signed   By: Ulyses Southward M.D.   On: 08/25/2013 09:57    Medications:  Prior to Admission:  Prescriptions prior to admission  Medication Sig Dispense Refill  . atorvastatin (LIPITOR) 40 MG tablet TAKE ONE TABLET BY MOUTH ONCE DAILY.  30 tablet  3  . carvedilol (COREG) 6.25 MG tablet TAKE 1 TABLET BY MOUTH TWICE DAILY WITH MEALS.  60 tablet  2  . Cholecalciferol (VITAMIN D) 2000 UNITS CAPS Take 2,000 Units by mouth daily.      . febuxostat (ULORIC) 40 MG tablet Take 40 mg by mouth daily.      . furosemide (LASIX) 40 MG tablet Take 1 tablet (40 mg total) by mouth daily.  30 tablet  6  . gabapentin (NEURONTIN) 300 MG capsule Take 300 mg by mouth at bedtime.      .  hydroxychloroquine (PLAQUENIL) 200 MG tablet Take 1 tablet by mouth Twice daily.      . Hypromellose (GENTEAL OP) Place 1 drop into both eyes daily as needed. Tired Eyes      . losartan (COZAAR) 50 MG tablet TAKE ONE TABLET BY MOUTH ONCE DAILY.  30 tablet  6  . nitroGLYCERIN (NITROSTAT) 0.4 MG SL tablet Place 1 tablet (0.4 mg total) under the tongue every 5 (five) minutes as needed for chest pain.  25 tablet  12  . potassium chloride (K-DUR) 10 MEQ tablet Take 10 mEq by mouth 2 (two) times daily.      Marland Kitchen  predniSONE (DELTASONE) 5 MG tablet Take 5 mg by mouth daily.      . raloxifene (EVISTA) 60 MG tablet Take 60 mg by mouth daily.      . traMADol (ULTRAM) 50 MG tablet Take 50 mg by mouth daily as needed.       . warfarin (COUMADIN) 5 MG tablet Take 1 tablet daily except 1/2 tablet on Mondays and Thursdays  30 tablet  6   Scheduled: . antiseptic oral rinse  15 mL Mouth Rinse BID  . atorvastatin  40 mg Oral q1800  . carvedilol  6.25 mg Oral BID WC  . cephALEXin  250 mg Oral 3 times per day  . cholecalciferol  2,000 Units Oral Daily  . digoxin  0.125 mg Oral Daily  . febuxostat  40 mg Oral Daily  . furosemide  40 mg Oral BID  . gabapentin  300 mg Oral QHS  . hydroxychloroquine  200 mg Oral BID  . lisinopril  2.5 mg Oral Daily  . potassium chloride  20 mEq Oral TID  . predniSONE  5 mg Oral Daily  . raloxifene  60 mg Oral Daily  . sodium chloride  3 mL Intravenous Q12H  . Warfarin - Pharmacist Dosing Inpatient   Does not apply Q24H   Continuous:  XVQ:MGQQPY chloride, bisacodyl, morphine injection, nitroGLYCERIN, ondansetron, sodium chloride, traMADol  Assesment: she was admitted with acute exacerbation of congestive heart failure. She has chronic systolic heart failure and her ejection fraction is only in the 20-25% range. This is a nonischemic cardiomyopathy. She is improved. She has diuresed well.  She has chronic atrial fibrillation and is chronically anticoagulated. Her INR is not quite  therapeutic today.  She has compression fractures in the lumbar spine. She is going to be able to get up and do some physical therapy.  She has pretty severe osteoporosis and has been on evista for that. She may need more intensive treatment of her rheumatoid arthritis but I will leave that decision with Dr. [name] who has been following her for some time  She has hypertension which is well controlled.  She has rheumatoid arthritis on therapy    Active Problems:   Chronic atrial fibrillation   Chronic anticoagulation   Nonischemic cardiomyopathy   Chronic systolic heart failure   Lumbar disc disease   Hypertension   Rheumatoid arthritis   Acute exacerbation of CHF (congestive heart failure)    Plan: Continue current treatments. She will have further evaluation today to see a she's a candidate for skilled care facility    LOS: 5 days   Alexee Delsanto L 08/26/2013, 8:38 AM

## 2013-08-26 NOTE — Clinical Social Work Placement (Signed)
Clinical Social Work Department CLINICAL SOCIAL WORK PLACEMENT NOTE 08/26/2013  Patient:  Marissa [name], Marissa [name]  Account Number:  [id number] Admit date:  08/21/2013  Clinical Social Worker:  Derenda Fennel, LCSW  Date/time:  08/25/2013 03:17 PM  Clinical Social Work is seeking post-discharge placement for this patient at the following level of care:   SKILLED NURSING   (*CSW will update this form in Epic as items are completed)   08/25/2013  Patient/family provided with [facility] Department of Clinical Social Work's list of facilities offering this level of care within the geographic area requested by the patient (or if unable, by the patient's family).  08/25/2013  Patient/family informed of their freedom to choose among providers that offer the needed level of care, that participate in Medicare, Medicaid or managed care program needed by the patient, have an available bed and are willing to accept the patient.  08/25/2013  Patient/family informed of [facility]' ownership interest in [facility], as well as of the fact that they are under no obligation to receive care at this facility.  PASARR submitted to EDS on 08/25/2013 PASARR number received from [employer]DS on 08/25/2013  FL2 transmitted to all facilities in geographic area requested by pt/family on  08/25/2013 FL2 transmitted to all facilities within larger geographic area on   Patient informed that his/her managed care company has contracts with or will negotiate with  certain facilities, including the following:     Patient/family informed of bed offers received:  08/26/2013 Patient chooses bed at [facility] Physician recommends and patient chooses bed at  [facility]  Patient to be transferred to [facility] on  08/26/2013 Patient to be transferred to facility by RN  The following physician request were entered in Epic:   Additional Comments:  Derenda Fennel, LCSW [phone]

## 2013-08-26 NOTE — Discharge Summary (Signed)
Physician Discharge Summary  Patient ID: Marissa [name] MRN: [id number] DOB/AGE: 1932/12/07 80 y.o. Primary Care Physician:FAGAN,ROY, MD Admit date: 08/21/2013 Discharge date: 08/26/2013    Discharge Diagnoses:   Active Problems:   Chronic atrial fibrillation   Chronic anticoagulation   Nonischemic cardiomyopathy   Chronic systolic heart failure   Lumbar disc disease   Hypertension   Rheumatoid arthritis   Acute exacerbation of CHF (congestive heart failure)     Medication List         atorvastatin 40 MG tablet  Commonly known as:  LIPITOR  TAKE ONE TABLET BY MOUTH ONCE DAILY.     carvedilol 6.25 MG tablet  Commonly known as:  COREG  TAKE 1 TABLET BY MOUTH TWICE DAILY WITH MEALS.     cephALEXin 250 MG capsule  Commonly known as:  KEFLEX  Take 1 capsule (250 mg total) by mouth every 8 (eight) hours.     digoxin 0.125 MG tablet  Commonly known as:  LANOXIN  Take 1 tablet (0.125 mg total) by mouth daily.     febuxostat 40 MG tablet  Commonly known as:  ULORIC  Take 40 mg by mouth daily.     furosemide 40 MG tablet  Commonly known as:  LASIX  Take 1 tablet (40 mg total) by mouth 2 (two) times daily.     gabapentin 300 MG capsule  Commonly known as:  NEURONTIN  Take 300 mg by mouth at bedtime.     GENTEAL OP  Place 1 drop into both eyes daily as needed. Tired Eyes     hydroxychloroquine 200 MG tablet  Commonly known as:  PLAQUENIL  Take 1 tablet by mouth Twice daily.     losartan 50 MG tablet  Commonly known as:  COZAAR  TAKE ONE TABLET BY MOUTH ONCE DAILY.     nitroGLYCERIN 0.4 MG SL tablet  Commonly known as:  NITROSTAT  Place 1 tablet (0.4 mg total) under the tongue every 5 (five) minutes as needed for chest pain.     potassium chloride 10 MEQ tablet  Commonly known as:  K-DUR  Take 10 mEq by mouth 2 (two) times daily.     predniSONE 5 MG tablet  Commonly known as:  DELTASONE  Take 5 mg by mouth daily.     raloxifene 60 MG tablet  Commonly  known as:  EVISTA  Take 60 mg by mouth daily.     traMADol 50 MG tablet  Commonly known as:  ULTRAM  Take 1 tablet (50 mg total) by mouth every 6 (six) hours as needed for moderate pain.     traMADol 50 MG tablet  Commonly known as:  ULTRAM  Take 50 mg by mouth daily as needed.     Vitamin D 2000 UNITS Caps  Take 2,000 Units by mouth daily.     warfarin 5 MG tablet  Commonly known as:  COUMADIN  Take 1 tablet daily except 1/2 tablet on Mondays and Thursdays        Discharged Condition: Improved    Consults: Cardiology  Significant Diagnostic Studies: Dg Lumbar Spine Complete  08/25/2013   CLINICAL DATA:  Lumbar spine fracture  EXAM: LUMBAR SPINE - COMPLETE 4+ VIEW  COMPARISON:  07/16/2013 ; correlation MRI lumbar spine 07/09/2013  FINDINGS: Five non-rib-bearing lumbar vertebrae.  Diffuse osseous demineralization.  Diffuse disc space narrowing and endplate spur formation throughout lumbar spine with biconvex thoracolumbar scoliosis.  Chronic height loss identified at the L4 vertebral body similar  to previous exam.  Height loss is also present at the L3 vertebral body, predominately centrally, similar to prior MR.  Facet degenerative changes throughout lumbar region.  SI joints grossly symmetric.  Numerous pelvic phleboliths.  IMPRESSION: Significant degenerative disc and facet disease changes of the lumbar spine associated with osseous demineralization and biconvex scoliosis.  Chronic height losses of the L4 and to a lesser extent L3 vertebral bodies similar to previous exam.  No new abnormalities.   Electronically Signed   By: Ulyses Southward M.D.   On: 08/25/2013 09:57   Dg Chest Portable 1 View  08/21/2013   CLINICAL DATA:  Shortness of breath for 3 days, history atrial fibrillation, hypertension, CHF, prior MI, endometrial cancer, GERD  EXAM: PORTABLE CHEST - 1 VIEW  COMPARISON:  Portable exam 1207 hr compared to 01/07/2013  FINDINGS: Enlargement of cardiac silhouette with pulmonary  vascular congestion.  Atherosclerotic calcification of a tortuous thoracic aorta.  Chronic elevation of right diaphragm with minimal right basilar atelectasis.  Central bronchitic changes.  No definite infiltrate, pleural effusion or pneumothorax.  IMPRESSION: Enlargement of cardiac silhouette with pulmonary vascular congestion.  Bronchitic changes with minimal right base atelectasis.   Electronically Signed   By: Ulyses Southward M.D.   On: 08/21/2013 12:18    Lab Results: Basic Metabolic Panel:  Recent Labs  09/38/18 1631 08/26/13 0457  NA 145 146  K 4.6 4.4  CL 104 106  CO2 30 31  GLUCOSE 156* 75  BUN 32* 29*  CREATININE 1.79* 1.72*  CALCIUM 9.4 9.3   Liver Function Tests: No results found for this basename: AST, ALT, ALKPHOS, BILITOT, PROT, ALBUMIN,  in the last 72 hours   CBC: No results found for this basename: WBC, NEUTROABS, HGB, HCT, MCV, PLT,  in the last 72 hours  Recent Results (from the past 240 hour(s))  URINE CULTURE     Status: None   Collection Time    08/21/13  2:35 PM      Result Value Ref Range Status   Specimen Description URINE, CATHETERIZED   Final   Special Requests NONE   Final   Culture  Setup Time     Final   Value: 08/21/2013 16:16     Performed at [facility] Count     Final   Value: >=100,000 COLONIES/ML     Performed at [facility]   Culture     Final   Value: ESCHERICHIA COLI     Performed at [facility]   Report Status 08/23/2013 FINAL   Final   Organism ID, Bacteria ESCHERICHIA COLI   Final  MRSA PCR SCREENING     Status: None   Collection Time    08/21/13  4:36 PM      Result Value Ref Range Status   MRSA by PCR NEGATIVE  NEGATIVE Final   Comment:            The GeneXpert MRSA Assay (FDA     approved for NASAL specimens     only), is one component of a     comprehensive MRSA colonization     surveillance program. It is not     intended to diagnose MRSA     infection nor to guide or     monitor  treatment for     MRSA infections.     Hospital Course: She was admitted with acute exacerbation of CHF. She also had atrial fibrillation with rapid ventricular  response. She was started on intravenous diuretics had Foley catheter placed because of the difficulty with her getting up. She is known to have osteoporosis and has a fracture in the lumbar spine. She had been on bed rest. She improved with treatment and had cardiology consultation. Digoxin was added. She had a double dose of Lasix from previously. I discussed her situation with her neurosurgeon and he says that she can be up wearing her brace but he wants her to avoid bending lifting or twisting. She was evaluated and felt to be appropriate for skilled care facility for deconditioning and because of her heart failure. She was back at baseline by the time of discharge  Discharge Exam: Blood pressure 109/56, pulse 75, temperature 98 F (36.7 C), temperature source Oral, resp. rate 18, height 5\' 5"  (1.651 m), weight 82.9 kg (182 lb 12.2 oz), SpO2 96.00%. She is awake and alert. She is still in atrial fibrillation. Her chest is much clearer. Her heart is regular  Disposition: She will go to the skilled care facility. She will be on a no added salt diet. She needs prothrombin time every other day. Once her prothrombin time stabilizes this can be modified. She needs PT OT and speech as needed she will be on oxygen at 2 L as needed and she should have oxygen saturation measured daily on room air to see if she still needs oxygen.      Discharge Orders   Future Appointments Provider Department Dept Phone   08/27/2013 11:00 AM 08/29/2013, MD [facility] [phone]   Future Orders Complete By Expires   Discharge to SNF when bed available  As directed         Signed: Eun Vermeer L   08/26/2013, 9:30 AM

## 2013-08-26 NOTE — Clinical Social Work Note (Signed)
PT recommending SNF. CSW presented bed offers and pt chooses [facility]. Facility and daughter notified. D/C today. Pt to transfer with RN. D/C summary faxed.  Derenda Fennel, [place] [phone]

## 2013-08-26 NOTE — Progress Notes (Signed)
UR chart review completed.  

## 2013-08-26 NOTE — Progress Notes (Signed)
Patient transferred to [facility], room 103. Report given to Admission Nurse. Vital signs stable at transfer. Patient's daughter notified of transfer.

## 2013-08-27 ENCOUNTER — Ambulatory Visit: Payer: Medicare Other | Admitting: Cardiovascular Disease

## 2013-08-28 ENCOUNTER — Telehealth: Payer: Self-pay | Admitting: *Deleted

## 2013-08-28 NOTE — Telephone Encounter (Signed)
End of service report noted for pt event monitor, printed and placed on Dr. Purvis Sheffield desk for review, pt to have apt with Dr. Purvis Sheffield  Tomorrow 08-29-13

## 2013-08-29 ENCOUNTER — Ambulatory Visit (INDEPENDENT_AMBULATORY_CARE_PROVIDER_SITE_OTHER): Payer: Medicare Other | Admitting: Cardiovascular Disease

## 2013-08-29 ENCOUNTER — Encounter: Payer: Self-pay | Admitting: Cardiovascular Disease

## 2013-08-29 VITALS — BP 108/78 | HR 57 | Ht 65.0 in | Wt 172.0 lb

## 2013-08-29 DIAGNOSIS — Z5181 Encounter for therapeutic drug level monitoring: Secondary | ICD-10-CM

## 2013-08-29 DIAGNOSIS — I5022 Chronic systolic (congestive) heart failure: Secondary | ICD-10-CM

## 2013-08-29 DIAGNOSIS — I2510 Atherosclerotic heart disease of native coronary artery without angina pectoris: Secondary | ICD-10-CM

## 2013-08-29 DIAGNOSIS — Z8679 Personal history of other diseases of the circulatory system: Secondary | ICD-10-CM

## 2013-08-29 DIAGNOSIS — I4729 Other ventricular tachycardia: Secondary | ICD-10-CM

## 2013-08-29 DIAGNOSIS — I428 Other cardiomyopathies: Secondary | ICD-10-CM

## 2013-08-29 DIAGNOSIS — Z7901 Long term (current) use of anticoagulants: Secondary | ICD-10-CM

## 2013-08-29 DIAGNOSIS — Z79899 Other long term (current) drug therapy: Secondary | ICD-10-CM

## 2013-08-29 DIAGNOSIS — I519 Heart disease, unspecified: Secondary | ICD-10-CM

## 2013-08-29 DIAGNOSIS — I4891 Unspecified atrial fibrillation: Secondary | ICD-10-CM

## 2013-08-29 DIAGNOSIS — I472 Ventricular tachycardia: Secondary | ICD-10-CM

## 2013-08-29 DIAGNOSIS — I10 Essential (primary) hypertension: Secondary | ICD-10-CM

## 2013-08-29 DIAGNOSIS — N189 Chronic kidney disease, unspecified: Secondary | ICD-10-CM

## 2013-08-29 DIAGNOSIS — I429 Cardiomyopathy, unspecified: Secondary | ICD-10-CM

## 2013-08-29 NOTE — Patient Instructions (Addendum)
Your physician recommends that you schedule a follow-up appointment in: 3-4 weeks    Please get blood work today (BMET,Digoxin)    Thanks for [facility] !

## 2013-08-29 NOTE — Progress Notes (Signed)
Patient ID: Marissa [name], female   DOB: [birth year], 80 y.o.   MRN: [id number]      SUBJECTIVE: The patient is a 80 year old woman with a complex cardiac history which I will summarize:  She has a history of CAD, an ischemic cardiomyopathy, chronic systolic heart failure, and permanent atrial fibrillation. She also has long-standing rheumatoid arthritis since age 18 with known vasculitis sequelae  The patient was admitted during the summer of 2014 to [facility] with a non-ST elevation myocardial infarction with symptoms of bilateral arm pain and shortness of breath. On arrival initially she was found to be in A. fib with RVR, with troponin trend highest at 20.0. She had a cardiac catheterization, revealing one-vessel coronary artery disease with diffuse 95% stenosis in the proximal OM 3. PCI was not completed as the area was very tortuous and the distal vessel distribution was very small. She had subsequently sustained an episode of stable monomorphic V. tach status post successful CPR and defibrillation x1 the same afternoon after catheterization, which was deemed to be ischemic in etiology.  After approximately 10 days in the hospital the patient was released, and found to have an EF of 40-45%.  She also had episodes of sinus bradycardia, junctional rhythm, and pauses greater than 3 seconds, and her Digoxin and Toprol XL were discontinued.  She was recently hospitalized at [facility] for an acute systolic heart failure exacerbation. An echocardiogram performed during that time revealed severely reduced left ventricular systolic function, EF 20-25%. I thought that this may have been tachycardia-mediated. She was started on both carvedilol and digoxin and her heart rate was controlled. She also has CKD. She is also on an ARB. She was discharged on Lasix 40 mg bid.  During that time, I thought that she may benefit from a 7 day event monitor to see if her heart rate remained well  controlled. I did think that if she developed significant bradyarrhythmias or pauses, she may require a pacemaker so that beta blockers could effectively be used. I had also thought that given her known h/o CAD, she may benefit from a nuclear stress test to evaluate for an ischemic etiology with respect to her decline in EF.  All of these thoughts were discussed with the patient and her daughter, [name], during that time. She said she would be willing to undergo coronary angiography (if need be), but prefers the femoral approach.   Today, she feels nauseous, has a diminished appetite, and feels weak. She does say that she has felt slightly better over the past 2 days. She denies shortness of breath. She also denies chest pain. She is currently residing at the [facility].    Allergies  Allergen Reactions  . Codeine Other (See Comments)    Makes patient sleepy.  Marland Kitchen Penicillins Rash    No current outpatient prescriptions on file.   No current facility-administered medications for this visit.    Past Medical History  Diagnosis Date  . Permanent atrial fibrillation     A.  Chronic Coumadin  . MVP (mitral valve prolapse)   . Essential hypertension, benign   . Nonischemic cardiomyopathy 2001    A.  07/23/11 - Echo: EF 35%;  B. 07/24/2011 - Cath: nonobs dzs ef 35%, 2+MR C. Echo 01/06/13: EF 40-45%, mild LVH, mlid MR, mod biatrial enlargment, mild RV dilatation, mod TR, PASP 54 mmHg  . Systolic CHF, chronic     A. Diag 07/2011, EF 35%  . Lumbar  disc disease     Laminectomy 2001  . History of endometrial cancer   . Hyperlipidemia   . Rheumatoid arthritis     Treated in past with Remicade and developed vasculitis  . Gout   . PONV (postoperative nausea and vomiting)   . NSTEMI (non-ST elevated myocardial infarction) 12/2012  . GERD (gastroesophageal reflux disease)   . Coronary artery dissection 12/2012    95% OM3 hazy lesion on cath, medically managed    Past Surgical History    Procedure Laterality Date  . Appendectomy    . Abdominal hysterectomy  2000    Stage 1 endometrial cancer  . Hernia repair  09/2008  . Hernia repair  04/17/11    Lap ventral incisional hernia repair with mesh   . Gastric biopsy      Benign  . Excision of lipoma    . Lumbar laminectomy  2001  . Hammer toe surgery    . Cardiac catheterization  01/08/13    OM3 95% lesion felt to represent coronary dissection, mild luminal irregularities elsewhere; EF 40-45%; medically managed    History   Social History  . Marital Status: Widowed    Spouse Name: N/A    Number of Children: N/A  . Years of Education: N/A   Occupational History  . Retired   .     Social History Main Topics  . Smoking status: Never Smoker   . Smokeless tobacco: Never Used  . Alcohol Use: Yes     Comment: Rare  . Drug Use: No  . Sexual Activity: Not on file   Other Topics Concern  . Not on file   Social History Narrative  . No narrative on file     Filed Vitals:   08/29/13 1524  BP: 108/78  Pulse: 57  Height: 5\' 5"  (1.651 m)  Weight: 172 lb (78.019 kg)    PHYSICAL EXAM General: NAD  Neck: No JVD, no thyromegaly or thyroid nodule.  Lungs: Clear to auscultation bilaterally with normal respiratory effort.  CV: Nondisplaced PMI. Heart irregular rhythm, rate-controlled, normal S1/S2, no S3/S4, II/VI holosystolic murmur. Trace peripheral edema. No carotid bruit. Normal pedal pulses.  Abdomen: Soft, nontender, no hepatosplenomegaly, no distention.  Neurologic: Alert and oriented x 3.  Psych: Normal affect.  Extremities: No clubbing or cyanosis. Vasculitic changes noted on legs.   ECG: reviewed and available in electronic records.      ASSESSMENT AND PLAN: 1. CAD: on Coreg, Lipitor, and losartan. She is also on warfarin, and this is the reason she is also not taking ASA. Given her recent decline in EF, consideration for a nuclear stress test to evaluate for an ischemic etiology will be given.  However, if her HR remains controlled, I may reassess her LV systolic function with an echo in 3-4 months, as this may have been tachycardia-mediated. 2. Chronic systolic heart failure: currently euvolemic and stable. EF 20-25%. On Carvedilol and losartan, as well as daily Furosemide 40 mg twice daily. Given her nausea and diminished appetite, I will check a BMET and digoxin level given her known CKD. 3. Atrial fibrillation: heart rate controlled today on Coreg and digoxin. Will have to montior, as she had previously gone into a junctional rhythm and had tachy-brady syndrome, and it had been previously discontinued for this reason. Continue warfarin. Check digoxin level. 4. Tachy-brady syndrome: will continue to monitor, given prior h/o junctional rhythm when taking metoprolol and digoxin. 5. HTN: controlled on present therapy.  6. Hyperlipidemia: on  Lipitor.  7. NSVT: no symptoms at present, no obvious recurrences. On Coreg. 8. CKD: check BMET today and digoxin level, given nausea and diminished appetite.  Dispo: f/u 3-4 weeks.  Time spent: 40 minutes.  Prentice Docker, M.D., F.A.C.C.

## 2013-09-01 ENCOUNTER — Telehealth: Payer: Self-pay

## 2013-09-01 ENCOUNTER — Other Ambulatory Visit: Payer: Self-pay | Admitting: *Deleted

## 2013-09-01 ENCOUNTER — Ambulatory Visit (INDEPENDENT_AMBULATORY_CARE_PROVIDER_SITE_OTHER): Payer: Medicare Other | Admitting: *Deleted

## 2013-09-01 DIAGNOSIS — Z5181 Encounter for therapeutic drug level monitoring: Secondary | ICD-10-CM

## 2013-09-01 DIAGNOSIS — I4891 Unspecified atrial fibrillation: Secondary | ICD-10-CM

## 2013-09-01 LAB — PROTIME-INR: INR: 2.4 — AB (ref 0.9–1.1)

## 2013-09-01 NOTE — Telephone Encounter (Signed)
Per Dr.Koneswaran patient should continue to HOLD Digoxin  I spoke with Nurse Stephannie Peters at [facility] and relayed information

## 2013-09-03 ENCOUNTER — Ambulatory Visit (INDEPENDENT_AMBULATORY_CARE_PROVIDER_SITE_OTHER): Payer: Medicare Other | Admitting: *Deleted

## 2013-09-03 DIAGNOSIS — I4891 Unspecified atrial fibrillation: Secondary | ICD-10-CM

## 2013-09-03 DIAGNOSIS — Z5181 Encounter for therapeutic drug level monitoring: Secondary | ICD-10-CM

## 2013-09-03 LAB — POCT INR: INR: 2.37

## 2013-09-08 ENCOUNTER — Telehealth: Payer: Self-pay | Admitting: *Deleted

## 2013-09-08 ENCOUNTER — Ambulatory Visit (INDEPENDENT_AMBULATORY_CARE_PROVIDER_SITE_OTHER): Payer: Medicare Other | Admitting: *Deleted

## 2013-09-08 DIAGNOSIS — I4891 Unspecified atrial fibrillation: Secondary | ICD-10-CM

## 2013-09-08 DIAGNOSIS — Z5181 Encounter for therapeutic drug level monitoring: Secondary | ICD-10-CM

## 2013-09-08 LAB — POCT INR: INR: 2.68

## 2013-09-08 NOTE — Telephone Encounter (Signed)
See coumadin note. 

## 2013-09-08 NOTE — Telephone Encounter (Signed)
INR 2.68

## 2013-09-15 ENCOUNTER — Ambulatory Visit (INDEPENDENT_AMBULATORY_CARE_PROVIDER_SITE_OTHER): Payer: Medicare Other | Admitting: *Deleted

## 2013-09-15 ENCOUNTER — Ambulatory Visit: Payer: Self-pay | Admitting: *Deleted

## 2013-09-15 DIAGNOSIS — Z5181 Encounter for therapeutic drug level monitoring: Secondary | ICD-10-CM

## 2013-09-15 DIAGNOSIS — I4891 Unspecified atrial fibrillation: Secondary | ICD-10-CM

## 2013-09-15 LAB — PROTIME-INR: INR: 3.5 — AB (ref 0.9–1.1)

## 2013-09-22 ENCOUNTER — Ambulatory Visit (INDEPENDENT_AMBULATORY_CARE_PROVIDER_SITE_OTHER): Payer: Medicare Other | Admitting: *Deleted

## 2013-09-22 DIAGNOSIS — Z5181 Encounter for therapeutic drug level monitoring: Secondary | ICD-10-CM

## 2013-09-22 DIAGNOSIS — I4891 Unspecified atrial fibrillation: Secondary | ICD-10-CM

## 2013-09-22 LAB — PROTIME-INR: INR: 2.7 — AB (ref 0.9–1.1)

## 2013-09-25 ENCOUNTER — Ambulatory Visit (HOSPITAL_COMMUNITY): Payer: No Typology Code available for payment source | Attending: Pulmonary Disease

## 2013-09-25 DIAGNOSIS — R110 Nausea: Secondary | ICD-10-CM | POA: Diagnosis not present

## 2013-09-25 DIAGNOSIS — R109 Unspecified abdominal pain: Secondary | ICD-10-CM | POA: Insufficient documentation

## 2013-09-29 ENCOUNTER — Ambulatory Visit (INDEPENDENT_AMBULATORY_CARE_PROVIDER_SITE_OTHER): Payer: Medicare Other | Admitting: Cardiovascular Disease

## 2013-09-29 ENCOUNTER — Ambulatory Visit (INDEPENDENT_AMBULATORY_CARE_PROVIDER_SITE_OTHER): Payer: Medicare Other | Admitting: *Deleted

## 2013-09-29 ENCOUNTER — Encounter: Payer: Self-pay | Admitting: Cardiovascular Disease

## 2013-09-29 VITALS — BP 96/48 | HR 104 | Ht 65.0 in | Wt 180.0 lb

## 2013-09-29 DIAGNOSIS — I519 Heart disease, unspecified: Secondary | ICD-10-CM

## 2013-09-29 DIAGNOSIS — I4891 Unspecified atrial fibrillation: Secondary | ICD-10-CM

## 2013-09-29 DIAGNOSIS — Z5181 Encounter for therapeutic drug level monitoring: Secondary | ICD-10-CM

## 2013-09-29 DIAGNOSIS — R609 Edema, unspecified: Secondary | ICD-10-CM

## 2013-09-29 DIAGNOSIS — R0602 Shortness of breath: Secondary | ICD-10-CM

## 2013-09-29 DIAGNOSIS — I429 Cardiomyopathy, unspecified: Secondary | ICD-10-CM

## 2013-09-29 DIAGNOSIS — I2510 Atherosclerotic heart disease of native coronary artery without angina pectoris: Secondary | ICD-10-CM

## 2013-09-29 DIAGNOSIS — Z8679 Personal history of other diseases of the circulatory system: Secondary | ICD-10-CM

## 2013-09-29 DIAGNOSIS — N189 Chronic kidney disease, unspecified: Secondary | ICD-10-CM

## 2013-09-29 DIAGNOSIS — I5022 Chronic systolic (congestive) heart failure: Secondary | ICD-10-CM

## 2013-09-29 DIAGNOSIS — I472 Ventricular tachycardia: Secondary | ICD-10-CM

## 2013-09-29 DIAGNOSIS — I10 Essential (primary) hypertension: Secondary | ICD-10-CM

## 2013-09-29 DIAGNOSIS — I428 Other cardiomyopathies: Secondary | ICD-10-CM

## 2013-09-29 DIAGNOSIS — I4729 Other ventricular tachycardia: Secondary | ICD-10-CM

## 2013-09-29 DIAGNOSIS — Z7901 Long term (current) use of anticoagulants: Secondary | ICD-10-CM

## 2013-09-29 LAB — POCT INR: INR: 2.72

## 2013-09-29 MED ORDER — METOPROLOL SUCCINATE ER 50 MG PO TB24: 50.0000 mg | ORAL_TABLET | Freq: Every day | ORAL | Status: DC

## 2013-09-29 NOTE — Patient Instructions (Signed)
Your physician recommends that you schedule a follow-up appointment in: 2-3 weeks with Dr Purvis Sheffield   Your physician has recommended you make the following change in your medication:   STOP COREG START TOPROL XL 50 mg daily

## 2013-09-29 NOTE — Progress Notes (Signed)
Patient ID: Marissa [name], female   DOB: 04/06/1933, 80 y.o.   MRN: [id number]      SUBJECTIVE:The patient is a 80 year old woman with a complex cardiac history which I will summarize:   She has a history of CAD, an ischemic cardiomyopathy, chronic systolic heart failure, and permanent atrial fibrillation. She also has long-standing rheumatoid arthritis since age 54 with known vasculitis sequelae  The patient was admitted during the summer of 2014 to [facility] with a non-ST elevation myocardial infarction with symptoms of bilateral arm pain and shortness of breath. On arrival initially she was found to be in A. fib with RVR, with troponin trend highest at 20.0. She had a cardiac catheterization, revealing one-vessel coronary artery disease with diffuse 95% stenosis in the proximal OM 3. PCI was not completed as the area was very tortuous and the distal vessel distribution was very small. She had subsequently sustained an episode of stable monomorphic V. tach status post successful CPR and defibrillation x1 the same afternoon after catheterization, which was deemed to be ischemic in etiology.  After approximately 10 days in the hospital the patient was released, and found to have an EF of 40-45%.   She also had episodes of sinus bradycardia, junctional rhythm, and pauses greater than 3 seconds, and her digoxin and Toprol XL were discontinued.   She was recently hospitalized at [facility] for an acute systolic heart failure exacerbation. An echocardiogram performed during that time revealed severely reduced left ventricular systolic function, EF 20-25%. I thought that this may have been tachycardia-mediated. She was started on both carvedilol and digoxin and her heart rate was controlled. She also has CKD. She is also on an ARB. She was discharged on Lasix 40 mg bid.  During that time, I thought that she may benefit from a 7 day event monitor to see if her heart rate remained well  controlled. I did think that if she developed significant bradyarrhythmias or pauses, she may require a pacemaker so that beta blockers could effectively be used.  I had also thought that given her known h/o CAD, she may benefit from a nuclear stress test to evaluate for an ischemic etiology with respect to her decline in EF.  All of these thoughts were discussed with the patient and her daughter, Marissa [name], during that time. She said she would be willing to undergo coronary angiography (if need be), but prefers the femoral approach.   At her last visit in late February, she had been feeling nauseous and had a diminished appetite, and also complained of weakness.   I checked a BMET and digoxin level with the following results: BUN 36, creatinine 2.1, digoxin level elevated at 2.54 on 2/20.  Today, HR is elevated at 104 bpm with BP 96/48. Her daughter says she has been more dyspneic at rest. She's had slightly more ankle swelling as well. She denies chest pain.   Allergies  Allergen Reactions  . Codeine Other (See Comments)    Makes patient sleepy.  Marland Kitchen Penicillins Rash    No current outpatient prescriptions on file.   No current facility-administered medications for this visit.    Past Medical History  Diagnosis Date  . Permanent atrial fibrillation     A.  Chronic Coumadin  . MVP (mitral valve prolapse)   . Essential hypertension, benign   . Nonischemic cardiomyopathy 2001    A.  07/23/11 - Echo: EF 35%;  B. 07/24/2011 - Cath: nonobs dzs ef 35%, 2+MR  C. Echo 01/06/13: EF 40-45%, mild LVH, mlid MR, mod biatrial enlargment, mild RV dilatation, mod TR, PASP 54 mmHg  . Systolic CHF, chronic     A. Diag 07/2011, EF 35%  . Lumbar disc disease     Laminectomy 2001  . History of endometrial cancer   . Hyperlipidemia   . Rheumatoid arthritis     Treated in past with Remicade and developed vasculitis  . Gout   . PONV (postoperative nausea and vomiting)   . NSTEMI (non-ST elevated myocardial  infarction) 12/2012  . GERD (gastroesophageal reflux disease)   . Coronary artery dissection 12/2012    95% OM3 hazy lesion on cath, medically managed    Past Surgical History  Procedure Laterality Date  . Appendectomy    . Abdominal hysterectomy  2000    Stage 1 endometrial cancer  . Hernia repair  09/2008  . Hernia repair  04/17/11    Lap ventral incisional hernia repair with mesh   . Gastric biopsy      Benign  . Excision of lipoma    . Lumbar laminectomy  2001  . Hammer toe surgery    . Cardiac catheterization  01/08/13    OM3 95% lesion felt to represent coronary dissection, mild luminal irregularities elsewhere; EF 40-45%; medically managed    History   Social History  . Marital Status: Widowed    Spouse Name: N/A    Number of Children: N/A  . Years of Education: N/A   Occupational History  . Retired   .     Social History Main Topics  . Smoking status: Never Smoker   . Smokeless tobacco: Never Used  . Alcohol Use: Yes     Comment: Rare  . Drug Use: No  . Sexual Activity: Not on file   Other Topics Concern  . Not on file   Social History Narrative  . No narrative on file     Filed Vitals:   09/29/13 1339  Height: 5\' 5"  (1.651 m)  Weight: 180 lb (81.647 kg)   BP 96/48  Pulse 104   PHYSICAL EXAM General: NAD  Neck: No JVD, no thyromegaly or thyroid nodule.  Lungs: Clear to auscultation bilaterally with normal respiratory effort.  CV: Nondisplaced PMI. Heart irregular rhythm, rate slightly elevated, normal S1/S2, no S3, II/VI holosystolic murmur. Trace peri-ankle edema. No carotid bruit. Normal pedal pulses.  Abdomen: Soft, nontender, no hepatosplenomegaly, no distention.  Neurologic: Alert and oriented x 3.  Psych: Normal affect.  Extremities: No clubbing or cyanosis. Vasculitic changes noted on legs.  ECG: reviewed and available in electronic records.      ASSESSMENT AND PLAN: 1. CAD: on Coreg, Lipitor, and losartan. She is also on warfarin,  and this is the reason she is also not taking ASA. Given her recent decline in EF, consideration for a nuclear stress test to evaluate for an ischemic etiology will be given. However, once HR is well controlled, I may reassess her LV systolic function with an echo in 3-4 months, as this may have been tachycardia-mediated.  Her BP is soft and HR is elevated, thus I cannot increase dose of carvedilol. I will stop this and start Toprol-XL 50 mg daily. 2. Chronic systolic heart failure: currently euvolemic and stable. EF 20-25%. On carvedilol and losartan, as well as furosemide 40 mg twice daily. Due to elevated HR and soft BP, will d/c Coreg and start Toprol-XL. 3. Atrial fibrillation: heart rate elevated today on Coreg and digoxin. Due  to elevated HR and soft BP, will d/c Coreg and start Toprol-XL.    Will have to monitor, as she had previously gone into a junctional rhythm and had tachy-brady syndrome, and it had been previously discontinued for this reason. Continue warfarin.  4. Tachy-brady syndrome: will continue to monitor, given prior h/o junctional rhythm when taking metoprolol and digoxin.  5. HTN: low normal on present therapy.  6. Hyperlipidemia: on Lipitor.  7. NSVT: no symptoms at present, no obvious recurrences. Switching Coreg to Toprol-XL. 8. CKD:   Dispo: f/u 2-3 weeks.    Prentice Docker, M.D., F.A.C.C.

## 2013-10-03 NOTE — H&P (Unsigned)
NAMEMAURENE, Marissa [name]                ACCOUNT NO.:  [id number]  MEDICAL RECORD NO.:  [id number]  LOCATION:                                 FACILITY:  PHYSICIAN:  Kingsley Callander. [name], MD       DATE OF BIRTH:  07-04-1933  DATE OF ADMISSION:  08/26/2013 DATE OF DISCHARGE:  LH                             HISTORY & PHYSICAL   HISTORY OF PRESENT ILLNESS:  This patient is a 80 year old female who was transferred from [facility] for rehabilitation following a congestive heart failure exacerbation.  She had also recently been dealing with a recent osteoporotic spine fracture managed by her neurosurgeon.  Her mobility had been quite limited and she had become more debilitated.  While in the hospital, she responded well to IV Lasix.  Her atrial fibrillation rate improved with treatment.  She improved and was stable for discharge on August 21, 2013.  She has chronic kidney disease, stage III with a creatinine of 1.72. Electrolytes were normal at discharge.  Her Lasix dose was modified to 40 mg twice a day.  She was treated with carvedilol, digoxin, and losartan as well.  PAST MEDICAL HISTORY: 1. Chronic atrial fibrillation. 2. Chronic systolic heart failure. 3. Rheumatoid arthritis. 4. Osteoporosis. 5. History of endometrial carcinoma status post hysterectomy. 6. Chronic edema. 7. Chronic kidney disease.  MEDICATIONS: 1. Atorvastatin 40 mg daily. 2. Carvedilol 6.25 mg b.i.d. 3. Digoxin 0.125 mg daily. 4. Uloric 40 mg daily. 5. Lasix 40 mg b.i.d. 6. Gabapentin 300 mg at bedtime. 7. Hydroxychloroquine 200 mg b.i.d. 8. Losartan 50 mg daily. 9. Sublingual nitroglycerin p.r.n. 10.Potassium 10 mEq b.i.d. 11.Prednisone 5 mg daily. 12.Evista 60 mg daily. 13.Tramadol 50 mg every 6 hours as needed. 14.Vitamin D 2000 units daily. 15.Coumadin 5 mg daily except 2.5 mg on Monday and Thursday.  ALLERGIES:  PENICILLIN and CODEINE.  SOCIAL HISTORY:  She does not smoke, drink, or  use recreational substances.  FAMILY HISTORY:  Stomach cancer in her father and hypertension in her mother.  REVIEW OF SYSTEMS:  At the time of her visit, her respiratory status was much improved and she was breathing comfortably.  She was not having any bleeding complications from Coumadin, RA symptoms have been reasonably well controlled on current therapy.  Her back has improved and her mobility has improved with physical therapy.  She continues to have some back discomfort.  PHYSICAL EXAMINATION:  GENERAL:  Alert and in no distress.  Cognitively intact. HEENT:  Eyes, nose, and oropharynx are unremarkable. NECK:  Supple with no JVD or thyromegaly. LUNGS:  Clear. HEART:  Irregularly irregular. ABDOMEN:  Soft and nontender with no palpable organomegaly. EXTREMITIES:  No edema. NEURO:  No focal weakness noted. LYMPH NODES:  No cervical or supraclavicular enlargement. SKIN:  Warm and dry.  LABORATORY DATA:  No known laboratory data.  IMPRESSION/PLAN: 1. Acute on chronic systolic heart failure now stable on the current     doses of Lasix, digoxin, losartan, and carvedilol.  We will     continue current treatment. 2. Chronic atrial fibrillation.  Continue Coumadin.  She has     satisfactory rate control with  digoxin and carvedilol for now. 3. Osteoporosis status post lumbar fracture.  She has been given a go     ahead by Dr. Newell Coral to be more mobile, continue physical therapy. 4. Recent Escherichia coli urinary tract infection, treatment     completed. 5. Chronic kidney disease, stable.  Avoid NSAIDs. 6. Rheumatoid arthritis.  Continue hydroxychloroquine, prednisone, and     tramadol as directed by Rheumatology. 7. Hyperlipidemia, continue atorvastatin. 8. Gout.  Continue Uloric. 9. Most recent echo from February reveals a left ventricular ejection     fraction of 20-25% with diffuse hypokinesis of the left ventricle.     There was mild aortic regurgitation and mild mitral  regurgitation.     The left atrium was severely dilated. 10.Coronary artery disease.  Last cardiac cath was in 2014 revealing     single-vessel disease with diffuse 95% stenosis of the proximal     obtuse marginal-3. 11.History of tachycardia bradycardia syndrome.    Kingsley Callander. [name], MD    ROF/MEDQ  D:  10/01/2013  T:  10/01/2013  Job:  [id number]

## 2013-10-06 ENCOUNTER — Ambulatory Visit (INDEPENDENT_AMBULATORY_CARE_PROVIDER_SITE_OTHER): Payer: Medicare Other | Admitting: *Deleted

## 2013-10-06 DIAGNOSIS — I4891 Unspecified atrial fibrillation: Secondary | ICD-10-CM

## 2013-10-06 DIAGNOSIS — Z5181 Encounter for therapeutic drug level monitoring: Secondary | ICD-10-CM

## 2013-10-06 LAB — PROTIME-INR: INR: 2.4 — AB (ref 0.9–1.1)

## 2013-10-13 ENCOUNTER — Ambulatory Visit (INDEPENDENT_AMBULATORY_CARE_PROVIDER_SITE_OTHER): Payer: Medicare Other | Admitting: *Deleted

## 2013-10-13 DIAGNOSIS — Z5181 Encounter for therapeutic drug level monitoring: Secondary | ICD-10-CM

## 2013-10-13 DIAGNOSIS — I4891 Unspecified atrial fibrillation: Secondary | ICD-10-CM

## 2013-10-13 LAB — PROTIME-INR: INR: 2.7 — AB (ref 0.9–1.1)

## 2013-10-16 ENCOUNTER — Ambulatory Visit (INDEPENDENT_AMBULATORY_CARE_PROVIDER_SITE_OTHER): Payer: BLUE CROSS/BLUE SHIELD | Admitting: *Deleted

## 2013-10-16 DIAGNOSIS — Z5181 Encounter for therapeutic drug level monitoring: Secondary | ICD-10-CM

## 2013-10-16 DIAGNOSIS — I4891 Unspecified atrial fibrillation: Secondary | ICD-10-CM

## 2013-10-16 LAB — POCT INR: INR: 2.3

## 2013-10-20 ENCOUNTER — Ambulatory Visit (INDEPENDENT_AMBULATORY_CARE_PROVIDER_SITE_OTHER): Payer: Medicare Other | Admitting: Adult Health

## 2013-10-20 ENCOUNTER — Encounter: Payer: Self-pay | Admitting: Adult Health

## 2013-10-20 VITALS — BP 91/58 | HR 101 | Ht 65.0 in | Wt 176.0 lb

## 2013-10-20 DIAGNOSIS — I509 Heart failure, unspecified: Secondary | ICD-10-CM

## 2013-10-20 DIAGNOSIS — I4891 Unspecified atrial fibrillation: Secondary | ICD-10-CM

## 2013-10-20 DIAGNOSIS — I10 Essential (primary) hypertension: Secondary | ICD-10-CM

## 2013-10-20 DIAGNOSIS — I5023 Acute on chronic systolic (congestive) heart failure: Secondary | ICD-10-CM

## 2013-10-20 DIAGNOSIS — I2510 Atherosclerotic heart disease of native coronary artery without angina pectoris: Secondary | ICD-10-CM

## 2013-10-20 DIAGNOSIS — I428 Other cardiomyopathies: Secondary | ICD-10-CM

## 2013-10-20 NOTE — Assessment & Plan Note (Signed)
BP is well controlled for her current cardiomyopathy with systolic dysfunction. She will be continued on her digoxin, lasix and  losartan for now. She is unable to tolerate coreg but is on metoprolol and tolerating this well.  She will be seen again in 3-4 months. Will not plan NM study until we see her on follow up if EF is not improved.

## 2013-10-20 NOTE — Assessment & Plan Note (Signed)
No evidence of fluid overload at this time. Continue current medication regimen.

## 2013-10-20 NOTE — Progress Notes (Deleted)
Name: Marissa [name]    DOB: 1933-03-01  Age: 80 y.o.  MR#: [id number]       PCP:  Carylon Perches, MD      Insurance: Payor: MEDICARE / Plan: MEDICARE PART A AND B / Product Type: *No Product type* /   CC:    Chief Complaint  Patient presents with  . Atrial Fibrillation  . Hypertension  . Coronary Artery Disease    VS Filed Vitals:   10/20/13 1451  BP: 91/58  Pulse: 101  Height: 5\' 5"  (1.651 m)  Weight: 176 lb (79.833 kg)    Weights Current Weight  10/20/13 176 lb (79.833 kg)  09/29/13 180 lb (81.647 kg)  08/29/13 172 lb (78.019 kg)    Blood Pressure  BP Readings from Last 3 Encounters:  10/20/13 91/58  09/29/13 96/48  08/29/13 108/78     Admit date:  (Not on file) Last encounter with RMR:  08/07/2013   Allergy Codeine and Penicillins  Current Outpatient Prescriptions  Medication Sig Dispense Refill  . atorvastatin (LIPITOR) 40 MG tablet TAKE ONE TABLET BY MOUTH ONCE DAILY.  30 tablet  3  . Cholecalciferol (VITAMIN D) 2000 UNITS CAPS Take 2,000 Units by mouth daily.      . digoxin (LANOXIN) 0.125 MG tablet Take 1 tablet (0.125 mg total) by mouth daily.  30 tablet  12  . febuxostat (ULORIC) 40 MG tablet Take 40 mg by mouth daily.      . furosemide (LASIX) 40 MG tablet Take 1 tablet (40 mg total) by mouth 2 (two) times daily.  30 tablet  6  . gabapentin (NEURONTIN) 300 MG capsule Take 300 mg by mouth at bedtime.      . hydroxychloroquine (PLAQUENIL) 200 MG tablet Take 1 tablet by mouth Twice daily.      . Hypromellose (GENTEAL OP) Place 1 drop into both eyes daily as needed. Tired Eyes      . losartan (COZAAR) 50 MG tablet TAKE ONE TABLET BY MOUTH ONCE DAILY.  30 tablet  6  . metoprolol succinate (TOPROL-XL) 50 MG 24 hr tablet Take 1 tablet (50 mg total) by mouth daily. Take with or immediately following a meal.  90 tablet  3  . nitroGLYCERIN (NITROSTAT) 0.4 MG SL tablet Place 1 tablet (0.4 mg total) under the tongue every 5 (five) minutes as needed for chest pain.  25  tablet  12  . potassium chloride (K-DUR,KLOR-CON) 10 MEQ tablet Take 10 mEq by mouth 2 (two) times daily.       . predniSONE (DELTASONE) 5 MG tablet Take 5 mg by mouth daily.      . raloxifene (EVISTA) 60 MG tablet Take 60 mg by mouth daily.      . traMADol (ULTRAM) 50 MG tablet Take 1 tablet (50 mg total) by mouth every 6 (six) hours as needed for moderate pain.  30 tablet  5  . warfarin (COUMADIN) 5 MG tablet Take 1 tablet daily except 1/2 tablet on Mondays and Thursdays  30 tablet  6   No current facility-administered medications for this visit.    Discontinued Meds:   There are no discontinued medications.  Patient Active Problem List   Diagnosis Date Noted  . Acute exacerbation of CHF (congestive heart failure) 08/21/2013  . Encounter for therapeutic drug monitoring 08/07/2013  . Tachy-brady syndrome 01/16/2013  . Acute on chronic kidney disease, stage 3 01/16/2013  . Coronary artery dissection 01/16/2013  . Sustained VT (ventricular tachycardia) 01/07/2013  .  Non-ST elevation myocardial infarction (NSTEMI), initial care episode 01/06/2013  . Acute on chronic systolic congestive heart failure 01/05/2013  . History of endometrial cancer   . Lumbar disc disease   . Hypertension   . Hyperlipidemia   . Rheumatoid arthritis   . Gout   . Cellulitis of right leg   . Nonischemic cardiomyopathy 08/09/2011  . Chronic systolic heart failure 08/09/2011  . Chronic anticoagulation 10/07/2010  . Chronic atrial fibrillation     LABS    Component Value Date/Time   NA 146 08/26/2013 0457   NA 145 08/25/2013 1631   NA 145 08/24/2013 0524   K 4.4 08/26/2013 0457   K 4.6 08/25/2013 1631   K 3.9 08/24/2013 0524   CL 106 08/26/2013 0457   CL 104 08/25/2013 1631   CL 106 08/24/2013 0524   CO2 31 08/26/2013 0457   CO2 30 08/25/2013 1631   CO2 31 08/24/2013 0524   GLUCOSE 75 08/26/2013 0457   GLUCOSE 156* 08/25/2013 1631   GLUCOSE 80 08/24/2013 0524   BUN 29* 08/26/2013 0457   BUN 32* 08/25/2013  1631   BUN 33* 08/24/2013 0524   CREATININE 1.72* 08/26/2013 0457   CREATININE 1.79* 08/25/2013 1631   CREATININE 1.67* 08/24/2013 0524   CREATININE 1.39* 05/22/2013 1444   CREATININE 1.36* 05/09/2013 0815   CREATININE 1.04 11/09/2010 1227   CALCIUM 9.3 08/26/2013 0457   CALCIUM 9.4 08/25/2013 1631   CALCIUM 9.3 08/24/2013 0524   GFRNONAA 27* 08/26/2013 0457   GFRNONAA 26* 08/25/2013 1631   GFRNONAA 28* 08/24/2013 0524   GFRAA 32* 08/26/2013 0457   GFRAA 30* 08/25/2013 1631   GFRAA 33* 08/24/2013 0524   CMP     Component Value Date/Time   NA 146 08/26/2013 0457   K 4.4 08/26/2013 0457   CL 106 08/26/2013 0457   CO2 31 08/26/2013 0457   GLUCOSE 75 08/26/2013 0457   BUN 29* 08/26/2013 0457   CREATININE 1.72* 08/26/2013 0457   CREATININE 1.39* 05/22/2013 1444   CALCIUM 9.3 08/26/2013 0457   PROT 7.1 01/06/2013 0028   ALBUMIN 3.5 01/06/2013 0028   AST 69* 01/06/2013 0028   ALT 24 01/06/2013 0028   ALKPHOS 53 01/06/2013 0028   BILITOT 0.4 01/06/2013 0028   GFRNONAA 27* 08/26/2013 0457   GFRAA 32* 08/26/2013 0457       Component Value Date/Time   WBC 7.3 08/22/2013 0441   WBC 8.5 08/21/2013 1231   WBC 8.8 01/16/2013 0535   HGB 11.2* 08/22/2013 0441   HGB 12.5 08/21/2013 1231   HGB 11.2* 01/16/2013 0535   HCT 35.2* 08/22/2013 0441   HCT 39.2 08/21/2013 1231   HCT 35.5* 01/16/2013 0535   MCV 91.4 08/22/2013 0441   MCV 91.2 08/21/2013 1231   MCV 97.0 01/16/2013 0535    Lipid Panel     Component Value Date/Time   CHOL 129 08/22/2013 0441   TRIG 60 08/22/2013 0441   HDL 59 08/22/2013 0441   CHOLHDL 2.2 08/22/2013 0441   VLDL 12 08/22/2013 0441   LDLCALC 58 08/22/2013 0441    ABG No results found for this basename: phart, pco2, pco2art, po2, po2art, hco3, tco2, acidbasedef, o2sat     Lab Results  Component Value Date   TSH 0.829 08/21/2013   BNP (last 3 results)  Recent Labs  01/05/13 1903 08/21/13 1220  PROBNP 3772.0* 8457.0*   Cardiac Panel (last 3 results) No results found for this basename:  CKTOTAL, CKMB, TROPONINI, RELINDX,  in the last 72 hours  Iron/TIBC/Ferritin No results found for this basename: iron, tibc, ferritin     EKG Orders placed in visit on 09/01/13  . CARDIAC EVENT MONITOR     Prior Assessment and Plan Problem List as of 10/20/2013     Cardiovascular and Mediastinum   Chronic atrial fibrillation   Last Assessment & Plan   08/07/2013 Office Visit Edited 08/07/2013  5:50 PM by Jodelle Gross, NP     Heart rate is elevated, and she does have some mild symptoms of shortness of breath associated. She states she can feel her heart rate going up and pounding. Other times, she states that she feels her heart rate going too slow.  I will place a cardiac monitor on this patient to evaluate heart rate control. She is essentially staying in the bed, and denies any significant dizziness and when she first chest out of bed. On going to increase her carvedilol to 6.25 mg twice a day on the cardiac monitor. Should the monitor revealed significant bradycardia we will decrease the dose back to 3.125 mg dose twice a day. The patient is symptomatic with a rapid heart rhythm. I am concerned that she may have some evidence of bradycardia although there is no documentation at this time. Review of old records did demonstrate that she has had some tachybradycardia syndrome in the past.    She will continue Coumadin dosing as directed.    Nonischemic cardiomyopathy   Last Assessment & Plan   08/07/2013 Office Visit Written 08/07/2013  5:54 PM by Jodelle Gross, NP     Secondary to decreased ejection fraction. She has a high likelihood of going into CHF with rapid heart rhythm. As stated we will increase her carvedilol to 6.25 mg twice a day, watching closely and carefully for blood pressure control and reviewed cardiac monitor for significant changes in heart rate leading to bradycardia.  She will have close followup with Dr. Beulah Gandy in 2 weeks.  Of note, the daughter is  requesting a hospital bed for her as she is having trouble lying flat with her lumbar fracture, and occasional difficulty breathing. She often has to place pillows behind her in order to eat. The patient does not want to have a hospital bed. I advised that she and her daughter need to talk about this, and if they would like to proceed with having one temporarily, they are to contact [name] RN with [facility] to coordinate this.  I have spent 30 minutes with this patient and her daughter.    Chronic systolic heart failure   Last Assessment & Plan   08/07/2013 Office Visit Written 08/07/2013  5:49 PM by Jodelle Gross, NP     The patient has no evidence of fluid overload. She is not taking her Lasix in the afternoon. We will go ahead and discontinue that. She will remain on Lasix 40 mg daily. Hold a BMET drawn to evaluate her kidney function, which can be drawn per home health evaluation through [facility].     Hypertension   Last Assessment & Plan   08/07/2013 Office Visit Written 08/07/2013  5:50 PM by Jodelle Gross, NP     Blood pressure is currently well-controlled. We will monitor this closely with increased dose of carvedilol.    Acute on chronic systolic congestive heart failure   Non-ST elevation myocardial infarction (NSTEMI), initial care episode   Sustained VT (ventricular tachycardia)   Tachy-brady syndrome   Coronary artery  dissection   Last Assessment & Plan   01/23/2013 Office Visit Written 01/23/2013  4:44 PM by Jodelle Gross, NP     She is doing well. She has not had to take nitroglycerin, she has had no recurrent chest pain, I have warned her that she is trying to do more than she needs at this time. She wants to go back to her usual activities but often becomes tired. I have advised her to take her time and to increase her activity slowly. She has not returned to work until after being seen again in one month for ongoing assessment. He works as a [employer] officer, and has  been told that she may take as much time as she needs. She will followup with Dr. Beulah Gandy to be est. with him.     Acute exacerbation of CHF (congestive heart failure)     Musculoskeletal and Integument   Lumbar disc disease   Rheumatoid arthritis     Genitourinary   Acute on chronic kidney disease, stage 3     Other   Chronic anticoagulation   Last Assessment & Plan   08/09/2011 Office Visit Written 08/09/2011  3:36 PM by Gaylord Shih, MD     Fall risk assessment done and preventative strategies reviewed. Patient advised if she hits her head she must get medical attention immediately.    History of endometrial cancer   Hyperlipidemia   Gout   Cellulitis of right leg   Encounter for therapeutic drug monitoring       Imaging: Dg Abd Acute W/chest  09/25/2013   CLINICAL DATA:  Nausea.  Left-sided abdominal pain.  EXAM: ACUTE ABDOMEN SERIES (ABDOMEN 2 VIEW & CHEST 1 VIEW)  COMPARISON:  Chest x-ray dated 08/21/2013 and lumbar radiographs dated 09/08/2013  FINDINGS: There is chronic cardiomegaly with tortuosity and calcification of the thoracic aorta. Pulmonary vascularity is within normal limits. Minimal atelectasis at the right lung base. There is no free air in the abdomen. There are no dilated loops of large or small bowel. Compression fractures of L3 and L4 are noted with a rotoscoliosis of the lumbar spine. This appears unchanged. Ingested tablets are seen in the stomach.  IMPRESSION: Slight atelectasis at the right lung base. Benign appearing abdomen.   Electronically Signed   By: Geanie Cooley M.D.   On: 09/25/2013 10:14

## 2013-10-20 NOTE — Assessment & Plan Note (Signed)
She appears well compensated for now. She still eats some salty foods, and daughter is frustrated with her about this. She wants her to downsize and move to assisted living where meals are prepared for her. The patient does not feel she is ready for this.   I have counseled her on low sodium diet and daily wts. She requests a shower chair and raised toilet seat device to assist with her overall deconditioning with decreased heart function. I have written Rx for this.  She is to weigh daily and plans to do so, with assistance of HHN who visits a couple of times a week. She is to document her wt and call us for wt gain. I have spent 30 minutes with this patient and her daughter educating her on diet and her current health status. We will have an echo completed in 3 months.

## 2013-10-20 NOTE — Progress Notes (Signed)
HPI: Marissa [name] is a 80 year old patient of Dr. Purvis Sheffield we are following for ongoing assessment and management of CAD, ischemic cardiomyopathy, chronic systolic heart failure, and permanent atrial fibrillation, on Coumadin..   The patient was last seen by Dr. Purvis Sheffield, in March of 2015. The patient has a history of non-ST elevated MI in the summer of 2014 with symptoms of bilateral arm pain and shortness of breath. The patient was also recently hospitalized at [facility] in March of 2015 for acute on chronic systolic heart failure echocardiogram during that time revealed severely reduced LV function with an EF of 20-25%. She was started on both carvedilol and digoxin and her heart rate was controlled.    At the last office visit carvedilol was discontinued and she started on Toprol XL daily to elevated heart rate with a soft BP. Echocardiogram was also suggested in June of 2015 for reassessment of LV function.  She comes today with her daughter. She has recently come home from SNF rehab. Home PT and OT are still working with her. She is not completely compliant with low sodium diet. She is medically compliant. She states that she sometimes get short of breath when she walks several feet, but for the most part, is feeling much better. No over edema.      Allergies  Allergen Reactions  . Codeine Other (See Comments)    Makes patient sleepy.  Marland Kitchen Penicillins Rash    Current Outpatient Prescriptions  Medication Sig Dispense Refill  . atorvastatin (LIPITOR) 40 MG tablet TAKE ONE TABLET BY MOUTH ONCE DAILY.  30 tablet  3  . Cholecalciferol (VITAMIN D) 2000 UNITS CAPS Take 2,000 Units by mouth daily.      . digoxin (LANOXIN) 0.125 MG tablet Take 1 tablet (0.125 mg total) by mouth daily.  30 tablet  12  . febuxostat (ULORIC) 40 MG tablet Take 40 mg by mouth daily.      . furosemide (LASIX) 40 MG tablet Take 1 tablet (40 mg total) by mouth 2 (two) times daily.  30 tablet  6  .  gabapentin (NEURONTIN) 300 MG capsule Take 300 mg by mouth at bedtime.      . hydroxychloroquine (PLAQUENIL) 200 MG tablet Take 1 tablet by mouth Twice daily.      . Hypromellose (GENTEAL OP) Place 1 drop into both eyes daily as needed. Tired Eyes      . losartan (COZAAR) 50 MG tablet TAKE ONE TABLET BY MOUTH ONCE DAILY.  30 tablet  6  . metoprolol succinate (TOPROL-XL) 50 MG 24 hr tablet Take 1 tablet (50 mg total) by mouth daily. Take with or immediately following a meal.  90 tablet  3  . nitroGLYCERIN (NITROSTAT) 0.4 MG SL tablet Place 1 tablet (0.4 mg total) under the tongue every 5 (five) minutes as needed for chest pain.  25 tablet  12  . potassium chloride (K-DUR,KLOR-CON) 10 MEQ tablet Take 10 mEq by mouth 2 (two) times daily.       . predniSONE (DELTASONE) 5 MG tablet Take 5 mg by mouth daily.      . raloxifene (EVISTA) 60 MG tablet Take 60 mg by mouth daily.      . traMADol (ULTRAM) 50 MG tablet Take 1 tablet (50 mg total) by mouth every 6 (six) hours as needed for moderate pain.  30 tablet  5  . warfarin (COUMADIN) 5 MG tablet Take 1 tablet daily except 1/2 tablet on Mondays and Thursdays  30 tablet  6   No current facility-administered medications for this visit.    Past Medical History  Diagnosis Date  . Permanent atrial fibrillation     A.  Chronic Coumadin  . MVP (mitral valve prolapse)   . Essential hypertension, benign   . Nonischemic cardiomyopathy 2001    A.  07/23/11 - Echo: EF 35%;  B. 07/24/2011 - Cath: nonobs dzs ef 35%, 2+MR C. Echo 01/06/13: EF 40-45%, mild LVH, mlid MR, mod biatrial enlargment, mild RV dilatation, mod TR, PASP 54 mmHg  . Systolic CHF, chronic     A. Diag 07/2011, EF 35%  . Lumbar disc disease     Laminectomy 2001  . History of endometrial cancer   . Hyperlipidemia   . Rheumatoid arthritis     Treated in past with Remicade and developed vasculitis  . Gout   . PONV (postoperative nausea and vomiting)   . NSTEMI (non-ST elevated myocardial  infarction) 12/2012  . GERD (gastroesophageal reflux disease)   . Coronary artery dissection 12/2012    95% OM3 hazy lesion on cath, medically managed    Past Surgical History  Procedure Laterality Date  . Appendectomy    . Abdominal hysterectomy  2000    Stage 1 endometrial cancer  . Hernia repair  09/2008  . Hernia repair  04/17/11    Lap ventral incisional hernia repair with mesh   . Gastric biopsy      Benign  . Excision of lipoma    . Lumbar laminectomy  2001  . Hammer toe surgery    . Cardiac catheterization  01/08/13    OM3 95% lesion felt to represent coronary dissection, mild luminal irregularities elsewhere; EF 40-45%; medically managed    ROS: Review of systems complete and found to be negative unless listed above  PHYSICAL EXAM BP 91/58  Pulse 101  Ht 5\' 5"  (1.651 m)  Wt 176 lb (79.833 kg)  BMI 29.29 kg/m2 General: Well developed, well nourished, in no acute distress Head: Eyes PERRLA, No xanthomas.   Normal cephalic and atramatic  Lungs: Clear bilaterally to auscultation and percussion. Heart: HRRR S1 S2, without MRG.  Pulses are 2+ & equal.            No carotid bruit. No JVD.  No abdominal bruits. No femoral bruits. Abdomen: Bowel sounds are positive, abdomen soft and non-tender without masses or                  Hernia's noted. Msk:  Back normal, normal gait, uses walker for ambulation. Normal strength and tone for age. Extremities: No clubbing, cyanosis, mild LE edema is noted.  DP +1 Neuro: Alert and oriented X 3. Psych:  Good affect, responds appropriately     ASSESSMENT AND PLAN

## 2013-10-20 NOTE — Assessment & Plan Note (Addendum)
She  Is rate controlled currently and NSR after DCCV while recently hospitalized. Can consider repeat  cardiac monitor if she again has symptoms of rapid HR. Cardiac monitor done in Feb of 2015 demonstrated atrial fib with labile HR, before medication changes Continue dig and metoprolol

## 2013-10-20 NOTE — Patient Instructions (Signed)
Your physician recommends that you schedule a follow-up appointment in: 4 months with Dr Koneswaran You will receive a reminder letter two months in advance reminding you to call and schedule your appointment. If you don't receive this letter, please contact our office.  Your physician recommends that you continue on your current medications as directed. Please refer to the Current Medication list given to you today.   

## 2013-10-24 ENCOUNTER — Ambulatory Visit (INDEPENDENT_AMBULATORY_CARE_PROVIDER_SITE_OTHER): Payer: Medicare Other | Admitting: *Deleted

## 2013-10-24 DIAGNOSIS — I4891 Unspecified atrial fibrillation: Secondary | ICD-10-CM

## 2013-10-24 DIAGNOSIS — Z5181 Encounter for therapeutic drug level monitoring: Secondary | ICD-10-CM

## 2013-10-24 LAB — POCT INR: INR: 1.8

## 2013-10-30 ENCOUNTER — Ambulatory Visit (INDEPENDENT_AMBULATORY_CARE_PROVIDER_SITE_OTHER): Payer: Medicare Other | Admitting: *Deleted

## 2013-10-30 ENCOUNTER — Telehealth: Payer: Self-pay | Admitting: *Deleted

## 2013-10-30 DIAGNOSIS — Z5181 Encounter for therapeutic drug level monitoring: Secondary | ICD-10-CM

## 2013-10-30 DIAGNOSIS — I4891 Unspecified atrial fibrillation: Secondary | ICD-10-CM

## 2013-10-30 LAB — POCT INR: INR: 2.6

## 2013-10-30 NOTE — Telephone Encounter (Signed)
INR 2.6 PT 31.4. NO MISSED DOSES

## 2013-10-30 NOTE — Telephone Encounter (Signed)
See coumadin note. 

## 2013-11-06 ENCOUNTER — Ambulatory Visit (INDEPENDENT_AMBULATORY_CARE_PROVIDER_SITE_OTHER): Payer: Medicare Other | Admitting: Cardiology

## 2013-11-06 DIAGNOSIS — Z5181 Encounter for therapeutic drug level monitoring: Secondary | ICD-10-CM

## 2013-11-06 DIAGNOSIS — I4891 Unspecified atrial fibrillation: Secondary | ICD-10-CM

## 2013-11-06 LAB — POCT INR: INR: 2.4

## 2013-11-19 ENCOUNTER — Other Ambulatory Visit: Payer: Self-pay | Admitting: Cardiovascular Disease

## 2013-11-19 ENCOUNTER — Telehealth: Payer: Self-pay | Admitting: Cardiovascular Disease

## 2013-11-19 DIAGNOSIS — I4891 Unspecified atrial fibrillation: Secondary | ICD-10-CM

## 2013-11-19 MED ORDER — FUROSEMIDE 40 MG PO TABS: 40.0000 mg | ORAL_TABLET | Freq: Two times a day (BID) | ORAL | Status: DC

## 2013-11-19 NOTE — Telephone Encounter (Signed)
Medication sent via escribe.  

## 2013-11-19 NOTE — Telephone Encounter (Signed)
Received fax refill request  Rx # [id number] Medication:  Furosemide 40 mg tablets Qty 30 Sig:  Take one tablet by mouth daily Physician:  Purvis Sheffield

## 2013-11-20 ENCOUNTER — Ambulatory Visit (INDEPENDENT_AMBULATORY_CARE_PROVIDER_SITE_OTHER): Payer: Medicare Other | Admitting: *Deleted

## 2013-11-20 DIAGNOSIS — I4891 Unspecified atrial fibrillation: Secondary | ICD-10-CM

## 2013-11-20 DIAGNOSIS — Z5181 Encounter for therapeutic drug level monitoring: Secondary | ICD-10-CM

## 2013-11-20 LAB — POCT INR: INR: 3.4

## 2013-11-27 ENCOUNTER — Ambulatory Visit (INDEPENDENT_AMBULATORY_CARE_PROVIDER_SITE_OTHER): Payer: Medicare Other | Admitting: *Deleted

## 2013-11-27 DIAGNOSIS — Z5181 Encounter for therapeutic drug level monitoring: Secondary | ICD-10-CM

## 2013-11-27 DIAGNOSIS — I4891 Unspecified atrial fibrillation: Secondary | ICD-10-CM

## 2013-11-27 LAB — POCT INR: INR: 2.8

## 2013-12-02 ENCOUNTER — Other Ambulatory Visit (HOSPITAL_COMMUNITY): Payer: Self-pay | Admitting: Internal Medicine

## 2013-12-02 DIAGNOSIS — Z139 Encounter for screening, unspecified: Secondary | ICD-10-CM

## 2013-12-02 DIAGNOSIS — Z1231 Encounter for screening mammogram for malignant neoplasm of breast: Secondary | ICD-10-CM

## 2013-12-04 ENCOUNTER — Ambulatory Visit (HOSPITAL_COMMUNITY)
Admission: RE | Admit: 2013-12-04 | Discharge: 2013-12-04 | Disposition: A | Discharge status: Home / Self Care | Payer: Medicare Other | Source: Ambulatory Visit | Attending: Internal Medicine | Admitting: Internal Medicine

## 2013-12-04 DIAGNOSIS — Z1231 Encounter for screening mammogram for malignant neoplasm of breast: Secondary | ICD-10-CM | POA: Insufficient documentation

## 2013-12-10 ENCOUNTER — Ambulatory Visit (INDEPENDENT_AMBULATORY_CARE_PROVIDER_SITE_OTHER): Payer: Medicare Other | Admitting: *Deleted

## 2013-12-10 ENCOUNTER — Telehealth: Payer: Self-pay | Admitting: *Deleted

## 2013-12-10 DIAGNOSIS — Z5181 Encounter for therapeutic drug level monitoring: Secondary | ICD-10-CM

## 2013-12-10 DIAGNOSIS — I4891 Unspecified atrial fibrillation: Secondary | ICD-10-CM

## 2013-12-10 LAB — POCT INR: INR: 2.6

## 2013-12-10 NOTE — Telephone Encounter (Signed)
inr 2.6 pt 31.7. No missed doses. Today is last visit

## 2013-12-10 NOTE — Telephone Encounter (Signed)
See coumadin note. 

## 2013-12-11 ENCOUNTER — Encounter: Payer: Self-pay | Admitting: Adult Health

## 2013-12-14 ENCOUNTER — Encounter (HOSPITAL_COMMUNITY): Payer: Self-pay | Admitting: Emergency Medicine

## 2013-12-14 ENCOUNTER — Emergency Department (HOSPITAL_COMMUNITY): Payer: Medicare Other

## 2013-12-14 ENCOUNTER — Emergency Department (HOSPITAL_COMMUNITY)
Admission: EM | Admit: 2013-12-14 | Discharge: 2013-12-14 | Disposition: A | Discharge status: Home / Self Care | Payer: Medicare Other | Attending: Emergency Medicine | Admitting: Emergency Medicine

## 2013-12-14 DIAGNOSIS — W19XXXA Unspecified fall, initial encounter: Secondary | ICD-10-CM

## 2013-12-14 DIAGNOSIS — Z880 Allergy status to penicillin: Secondary | ICD-10-CM | POA: Insufficient documentation

## 2013-12-14 DIAGNOSIS — S32009A Unspecified fracture of unspecified lumbar vertebra, initial encounter for closed fracture: Secondary | ICD-10-CM | POA: Insufficient documentation

## 2013-12-14 DIAGNOSIS — S301XXA Contusion of abdominal wall, initial encounter: Secondary | ICD-10-CM | POA: Insufficient documentation

## 2013-12-14 DIAGNOSIS — M545 Low back pain, unspecified: Secondary | ICD-10-CM

## 2013-12-14 DIAGNOSIS — K219 Gastro-esophageal reflux disease without esophagitis: Secondary | ICD-10-CM | POA: Insufficient documentation

## 2013-12-14 DIAGNOSIS — W010XXA Fall on same level from slipping, tripping and stumbling without subsequent striking against object, initial encounter: Secondary | ICD-10-CM | POA: Insufficient documentation

## 2013-12-14 DIAGNOSIS — Z7901 Long term (current) use of anticoagulants: Secondary | ICD-10-CM | POA: Insufficient documentation

## 2013-12-14 DIAGNOSIS — Z9889 Other specified postprocedural states: Secondary | ICD-10-CM | POA: Insufficient documentation

## 2013-12-14 DIAGNOSIS — I2510 Atherosclerotic heart disease of native coronary artery without angina pectoris: Secondary | ICD-10-CM | POA: Insufficient documentation

## 2013-12-14 DIAGNOSIS — I4891 Unspecified atrial fibrillation: Secondary | ICD-10-CM | POA: Insufficient documentation

## 2013-12-14 DIAGNOSIS — S99929A Unspecified injury of unspecified foot, initial encounter: Secondary | ICD-10-CM

## 2013-12-14 DIAGNOSIS — I252 Old myocardial infarction: Secondary | ICD-10-CM | POA: Insufficient documentation

## 2013-12-14 DIAGNOSIS — I5022 Chronic systolic (congestive) heart failure: Secondary | ICD-10-CM | POA: Insufficient documentation

## 2013-12-14 DIAGNOSIS — Z90710 Acquired absence of both cervix and uterus: Secondary | ICD-10-CM | POA: Insufficient documentation

## 2013-12-14 DIAGNOSIS — I10 Essential (primary) hypertension: Secondary | ICD-10-CM | POA: Insufficient documentation

## 2013-12-14 DIAGNOSIS — Z8542 Personal history of malignant neoplasm of other parts of uterus: Secondary | ICD-10-CM | POA: Insufficient documentation

## 2013-12-14 DIAGNOSIS — S8990XA Unspecified injury of unspecified lower leg, initial encounter: Secondary | ICD-10-CM | POA: Insufficient documentation

## 2013-12-14 DIAGNOSIS — S99919A Unspecified injury of unspecified ankle, initial encounter: Secondary | ICD-10-CM

## 2013-12-14 DIAGNOSIS — Z79899 Other long term (current) drug therapy: Secondary | ICD-10-CM | POA: Insufficient documentation

## 2013-12-14 DIAGNOSIS — E785 Hyperlipidemia, unspecified: Secondary | ICD-10-CM | POA: Insufficient documentation

## 2013-12-14 DIAGNOSIS — S32000A Wedge compression fracture of unspecified lumbar vertebra, initial encounter for closed fracture: Secondary | ICD-10-CM

## 2013-12-14 DIAGNOSIS — Y9389 Activity, other specified: Secondary | ICD-10-CM | POA: Insufficient documentation

## 2013-12-14 DIAGNOSIS — M109 Gout, unspecified: Secondary | ICD-10-CM | POA: Insufficient documentation

## 2013-12-14 DIAGNOSIS — Z9089 Acquired absence of other organs: Secondary | ICD-10-CM | POA: Insufficient documentation

## 2013-12-14 DIAGNOSIS — Y92009 Unspecified place in unspecified non-institutional (private) residence as the place of occurrence of the external cause: Secondary | ICD-10-CM | POA: Insufficient documentation

## 2013-12-14 HISTORY — DX: Wedge compression fracture of unspecified lumbar vertebra, initial encounter for closed fracture: S32.000A

## 2013-12-14 LAB — BASIC METABOLIC PANEL
BUN: 33 mg/dL — ABNORMAL HIGH (ref 6–23)
CO2: 31 mEq/L (ref 19–32)
Calcium: 9.9 mg/dL (ref 8.4–10.5)
Chloride: 104 mEq/L (ref 96–112)
Creatinine, Ser: 1.65 mg/dL — ABNORMAL HIGH (ref 0.50–1.10)
GFR calc Af Amer: 33 mL/min — ABNORMAL LOW (ref >90)
GFR, EST NON AFRICAN AMERICAN: 29 mL/min — AB (ref >90)
Glucose, Bld: 89 mg/dL (ref 70–99)
POTASSIUM: 3.8 meq/L (ref 3.7–5.3)
SODIUM: 146 meq/L (ref 137–147)

## 2013-12-14 LAB — CBC WITH DIFFERENTIAL/PLATELET
BASOS PCT: 0 % (ref 0–1)
Basophils Absolute: 0 10*3/uL (ref 0.0–0.1)
Eosinophils Absolute: 0.1 10*3/uL (ref 0.0–0.7)
Eosinophils Relative: 1 % (ref 0–5)
HCT: 35.7 % — ABNORMAL LOW (ref 36.0–46.0)
HEMOGLOBIN: 11.2 g/dL — AB (ref 12.0–15.0)
Lymphocytes Relative: 19 % (ref 12–46)
Lymphs Abs: 1.7 10*3/uL (ref 0.7–4.0)
MCH: 29.1 pg (ref 26.0–34.0)
MCHC: 31.4 g/dL (ref 30.0–36.0)
MCV: 92.7 fL (ref 78.0–100.0)
Monocytes Absolute: 0.7 10*3/uL (ref 0.1–1.0)
Monocytes Relative: 7 % (ref 3–12)
NEUTROS ABS: 6.7 10*3/uL (ref 1.7–7.7)
NEUTROS PCT: 73 % (ref 43–77)
PLATELETS: 198 10*3/uL (ref 150–400)
RBC: 3.85 MIL/uL — ABNORMAL LOW (ref 3.87–5.11)
RDW: 15.2 % (ref 11.5–15.5)
WBC: 9.2 10*3/uL (ref 4.0–10.5)

## 2013-12-14 LAB — PROTIME-INR
INR: 2.52 — AB (ref 0.00–1.49)
PROTHROMBIN TIME: 26.3 s — AB (ref 11.6–15.2)

## 2013-12-14 MED ORDER — HYDROCODONE-ACETAMINOPHEN 5-325 MG PO TABS
1.0000 | ORAL_TABLET | Freq: Once | ORAL | Status: AC
  Administered 2013-12-14: 1 via ORAL
  Filled 2013-12-14: qty 1

## 2013-12-14 NOTE — Discharge Instructions (Signed)
°Emergency Department Resource Guide °1) Find a Doctor and Pay Out of Pocket °Although you won't have to find out who is covered by your insurance plan, it is a good idea to ask around and get recommendations. You will then need to call the office and see if the doctor you have chosen will accept you as a new patient and what types of options they offer for patients who are self-pay. Some doctors offer discounts or will set up payment plans for their patients who do not have insurance, but you will need to ask so you aren't surprised when you get to your appointment. ° °2) Contact Your Local Health Department °Not all health departments have doctors that can see patients for sick visits, but many do, so it is worth a call to see if yours does. If you don't know where your local health department is, you can check in your phone book. The CDC also has a tool to help you locate your state's health department, and many state websites also have listings of all of their local health departments. ° °3) Find a Walk-in Clinic °If your illness is not likely to be very severe or complicated, you may want to try a walk in clinic. These are popping up all over the country in pharmacies, drugstores, and shopping centers. They're usually staffed by nurse practitioners or physician assistants that have been trained to treat common illnesses and complaints. They're usually fairly quick and inexpensive. However, if you have serious medical issues or chronic medical problems, these are probably not your best option. ° °No Primary Care Doctor: °- Call [facility] at  832-8000 - they can help you locate a primary care doctor that  accepts your insurance, provides certain services, etc. °- Physician Referral Service- 1-800-533-3463 ° °Chronic Pain Problems: °Organization         Address  Phone   Notes  °[facility]  ([phone]) [phone] Patients need to be referred by their primary care doctor.  ° °Medication  Assistance: °Organization         Address  Phone   Notes  °[place] [facility] [address] °[place], [place] [address] (336) 641-8030 --Must be a resident of [place] °-- Must have NO insurance coverage whatsoever (no Medicaid/ Medicare, etc.) °-- The pt. MUST have a primary care doctor that directs their care regularly and follows them in the community °  °[facility]  (866) 331-1348   °[employer]  (888) 892-1162   ° °Agencies that provide inexpensive medical care: °Organization         Address  Phone   Notes  °[facility]  (336) 832-8035   °[facility]    (336) 832-7272   °[facility] [address] °[place], [place] [address] (336) 832-4777   °[facility] [address], °[place] (336) [phone]   °[facility]    (336) 373-0678   °[facility]    (336) 272-1050   °[facility] ° [address], [place] Phone:  (336) 832-4444, Fax:  (336) 832-4440 Hours of Operation:  9 am - 6 pm, M-F.  Also accepts Medicaid/Medicare and self-pay.  °[facility] ° [address], [place] Phone: (336) [phone], Fax: (336) [phone]. Hours of Operation:  8:30 am - 5:30 pm, M-F.  Also accepts Medicaid and self-pay.  °[facility] [place] [address], [place] Phone: [phone]   [facility] [address] Natasha Bence [place], [place] [phone], Ext. 123 Mondays & Thursdays: 7-9 AM.  First 15 patients are seen on a first come, first serve basis.    Medicaid-accepting [facility] Providers:  Organization         Address  Phone   Notes  [facility] [address], [place] [phone] Also accepts self-pay patients.  [facility] [address] Laurell Josephs [place], [place]  [phone]   [facility] [address], [place]  [phone]   [facility] [address] [place] [phone]   Renaye Rakers [address], [place]   [phone] Only accepts [place] patients after they have their name applied to their card.   Self-Pay (no insurance) in [facility]:  Organization         Address  Phone   Notes  Sickle Cell Patients, [facility] [address] [place], [place] [phone]   [facility] [address] [place], [place] [phone]   [facility]  [address], [place] [phone]   [facility]/Dr. Osei-Bonsu  [address] [place] or [address], [place] [phone] Phone number for both [place] and [place] locations is the same.  [facility] [address], [place] [phone]   [facility] [address] [place] or [address] [phone]   [facility] [address] [place] [phone], phone; [phone], fax Sees patients 1st and 3rd Saturday of every month.  Must not qualify for public or private insurance (i.e. Medicaid, Medicare, [place], [place] Health Choice, Veterans' Benefits)  Household income should be no more than 200% of the poverty level The clinic cannot treat you if you are pregnant or think you are pregnant  Sexually transmitted diseases are not treated at the clinic.    Dental Care: Organization         Address  Phone  Notes  [facility] Department of [facility] [address] [place], [place] [phone] Accepts children up to age 24 who are enrolled in [place] or [place] Health Choice; pregnant women with a Medicaid card; and children who have applied for Medicaid or [place] Health Choice, but were declined, whose parents can pay a reduced fee at time of service.  [facility]  Department of [facility]  [address], [place] [phone] Accepts children up to age 47 who are enrolled in [place] or [place] Health Choice; pregnant women with a Medicaid card; and children who have applied for Medicaid or [place] Health Choice, but were declined, whose parents can pay a reduced fee at time of service.  [facility]  [address] [place], [place] [phone] Patients are seen by appointment only. Walk-ins are not accepted. [facility] will see patients 27 years of age and older. Monday - Tuesday (8am-5pm) Most Wednesdays (8:30-5pm) $30 per visit, cash only  [facility]  [address], [facility] [phone] Patients are seen by appointment only. Walk-ins are not accepted. [facility] will see patients 59 years of age and older. One  Wednesday Evening (Monthly: Volunteer Based).  $30 per visit, cash only  °[facility]  ([phone]) [phone] for adults; Children under age 4, call Graduate Pediatric Dentistry at [phone]. Children aged 4-14, please call [phone] to request a pediatric application. ° Dental services are provided in all areas of dental care including fillings, crowns and bridges, complete and partial dentures, implants, gum treatment, root canals, and extractions. Preventive care is also provided. Treatment is provided to both adults and children. °Patients are selected via a lottery and there is often a waiting list. °  °[facility] [address], °[place] ° (336) 763-8833 [email or web address] °  °[facility] [address], [place], [place] (336)723-1848, Ext. 123 Second and Fourth Thursday of each month, opens at 6:30 AM; Clinic ends at 9 AM.  Patients are seen on a first-come first-served basis, and a limited number are seen during each clinic.  ° °[facility] ° [address], [place], [place] ([phone]) [phone]    Eligibility Requirements °You must have lived in [place], [place], or [place] counties for at least the last three months. °  You cannot be eligible for state or federal sponsored healthcare insurance, including Veterans Administration, Medicaid, or Medicare. °  You generally cannot be eligible for healthcare insurance through your employer.  °  How to apply: °Eligibility screenings are held every Tuesday and Wednesday afternoon from 1:00 pm until 4:00 pm. You do not need an appointment for the interview!  °[facility] [address], [place], [place] [phone]   °[facility]  336-342-8273   °[facility]  336-703-3100   °[facility]  336-570-6415   ° °Behavioral Health Resources in the Community: °Intensive Outpatient Programs °Organization         Address  Phone  Notes  °[facility] [address], [place], [place] [phone]   °[facility] [address], [place], [place] [phone]   °[facility] [address], [place], [place] ° 336-882-2125   °[facility] [address],  °[place], [place] 1-800-853-5163 or 336-641-4981   °Substance Abuse Resources °Organization         Address  Phone  Notes  °[facility]  336-882-2125   °[facility]  336-784-9470   °[facility]  336-285-9073   °[facility]  336-845-3988   °Residential & Outpatient Substance Abuse Program  1-800-659-3381   °Psychological Services °Organization         Address  Phone  Notes  °[facility]  336- 832-9600   °[facility]  336- 378-7881   °[facility] [address], [place] 1-800-853-5163 or 336-641-4981   ° °Mobile Crisis Teams °Organization         Address  Phone  Notes  °[facility], Mobile Crisis Care Unit  1-877-626-1772   °Assertive °[facility] Services ° [address]  [place], [place] 336-834-9664   °Sharon DeEsch [address] °[place] 336-554-5454   ° °Self-Help/Support Groups °Organization         Address  Phone             Notes  °[facility]  - variety of support groups  336- 373-1402 Call for more information  °Narcotics Anonymous (NA), [facility] [address], °[place]  2 meetings at this location  ° °  Residential Treatment Programs Organization         Address  Phone  Notes  [facility] [address]    [place]  2-025-427-0623   [facility]  [address] [place] [address], [place], [place] [phone]   [facility] [address] [place], [place] [phone] Admissions: 8am-3pm M-F  [facility] [address],    [place], [place] [phone]   [facility] [address] [place], [place], [place] [phone]   [facility] [address],  [place], [place] [phone]   [facility] - Intensive Outpatient [address] Laurell Josephs [address], [place], [place] [phone]   [facility] ([facility] [address] [place]  [place], [place] 7-824-235-3614 or (867) 023-5632   [facility] ([facility]) [address] [place], [place] 619-509-3267 Accepts Medicaid  [facility] [address],  [place] 1-245-809-9833 Substance Abuse/Addiction Treatment   [facility] Organization         Address  Phone  Notes  [facility]  564-561-7728   Angie Fava, PhD [address] Ervin Knack [place], [place]   (413)123-4944 or 339-755-9342   [facility]   [address] [place], [place] 214-588-1317   [facility] [address] [place], [place] [phone] Insurance/Medicaid/sponsorship through [facility] [address]                                    [place], [place] [phone])[phone] Therapy/tele-psych/case    [facility] [address], [place] ([phone]    Dr. Lolly Mustache  [phone]   [facility]  [employer] [facility] Dept. 1) [address], [place] 2) [address], [place] 3)  [address], [place] [phone] [phone]  [phone]   [facility] Child Abuse Hotline (931) 098-0320 or 419-315-4003 (After Hours)       Take your usual prescriptions as previously directed.  Apply moist heat or ice to the area(s) of discomfort, for 15 minutes at a time, several times per day for the next few days.  Do not fall asleep on a heating or ice pack.  Call your regular medical doctor tomorrow morning to schedule a follow up appointment in the next 2 days.  Return to the Emergency Department immediately if worsening.

## 2013-12-14 NOTE — ED Notes (Signed)
Pt states hand slipped on bsc this am and fell. Pt c/o pain around lower trunk area. Pt has bruising noted to Left lower panna of abd that is hard. Pt on warfarin also. Denies hitting head. Pt is A&O. Pt c/o pain to lateral right foot but no obvious swelling/deformity noted.

## 2013-12-14 NOTE — ED Provider Notes (Signed)
CSN: [id number]     Arrival date & time 12/14/13  0815 History   First MD Initiated Contact with Patient 12/14/13 [phone]     Chief Complaint  Patient presents with  . Fall      HPI Pt was seen at 0840. Per pt and her family, c/o sudden onset and resolution of one episode of slip and fall approximately 0330 overnight last night. Pt states she was trying to use her bedside commode when her "hand slipped" and she fell onto her buttocks onto the floor. Pt c/o low back pain, left lower abd wall "bruise," and right foot "pain" which "just hurts when I move it a certain way." Pt was able to stand up on her own and get back into bed. Denies hitting head, no syncope/LOC, no AMS, no neck pain, no abd pain, no N/V/D, no CP/SOB, no focal motor weakness, no tingling/numbness in extremities. Pt walks with walker per baseline.   Past Medical History  Diagnosis Date  . Permanent atrial fibrillation     A.  Chronic Coumadin  . MVP (mitral valve prolapse)   . Essential hypertension, benign   . Nonischemic cardiomyopathy 2001    A.  07/23/11 - Echo: EF 35%;  B. 07/24/2011 - Cath: nonobs dzs ef 35%, 2+MR C. Echo 01/06/13: EF 40-45%, mild LVH, mlid MR, mod biatrial enlargment, mild RV dilatation, mod TR, PASP 54 mmHg  . Systolic CHF, chronic     A. Diag 07/2011, EF 35%  . Lumbar disc disease     Laminectomy 2001  . History of endometrial cancer   . Hyperlipidemia   . Rheumatoid arthritis     Treated in past with Remicade and developed vasculitis  . Gout   . PONV (postoperative nausea and vomiting)   . NSTEMI (non-ST elevated myocardial infarction) 12/2012  . GERD (gastroesophageal reflux disease)   . Coronary artery dissection 12/2012    95% OM3 hazy lesion on cath, medically managed   Past Surgical History  Procedure Laterality Date  . Appendectomy    . Abdominal hysterectomy  2000    Stage 1 endometrial cancer  . Hernia repair  09/2008  . Hernia repair  04/17/11    Lap ventral incisional hernia repair  with mesh   . Gastric biopsy      Benign  . Excision of lipoma    . Lumbar laminectomy  2001  . Hammer toe surgery    . Cardiac catheterization  01/08/13    OM3 95% lesion felt to represent coronary dissection, mild luminal irregularities elsewhere; EF 40-45%; medically managed   Family History  Problem Relation Age of Onset  . Cancer Father    History  Substance Use Topics  . Smoking status: Never Smoker   . Smokeless tobacco: Never Used  . Alcohol Use: Yes     Comment: Rare    Review of Systems ROS: Statement: All systems negative except as marked or noted in the HPI; Constitutional: Negative for fever and chills. ; ; Eyes: Negative for eye pain, redness and discharge. ; ; ENMT: Negative for ear pain, hoarseness, nasal congestion, sinus pressure and sore throat. ; ; Cardiovascular: Negative for chest pain, palpitations, diaphoresis, dyspnea and peripheral edema. ; ; Respiratory: Negative for cough, wheezing and stridor. ; ; Gastrointestinal: Negative for nausea, vomiting, diarrhea, abdominal pain, blood in stool, hematemesis, jaundice and rectal bleeding. . ; ; Genitourinary: Negative for dysuria, flank pain and hematuria. ; ; Musculoskeletal: +right foot pain, low back pain. Negative  for neck pain. Negative for swelling and deformity..; ; Skin: +contusion. Negative for pruritus, rash, abrasions, blisters, and skin lesion.; ; Neuro: Negative for headache, lightheadedness and neck stiffness. Negative for weakness, altered level of consciousness , altered mental status, extremity weakness, paresthesias, involuntary movement, seizure and syncope.      Allergies  Codeine and Penicillins  Home Medications   Prior to Admission medications   Medication Sig Start Date End Date Taking? Authorizing Provider  atorvastatin (LIPITOR) 40 MG tablet TAKE ONE TABLET BY MOUTH ONCE DAILY. 05/13/13   Laqueta Linden, MD  Cholecalciferol (VITAMIN D) 2000 UNITS CAPS Take 2,000 Units by mouth daily.     Historical Provider, MD  digoxin (LANOXIN) 0.125 MG tablet Take 1 tablet (0.125 mg total) by mouth daily. 08/26/13   Fredirick Maudlin, MD  febuxostat (ULORIC) 40 MG tablet Take 40 mg by mouth daily.    Historical Provider, MD  furosemide (LASIX) 40 MG tablet Take 1 tablet (40 mg total) by mouth 2 (two) times daily. 11/19/13   Jodelle Gross, NP  gabapentin (NEURONTIN) 300 MG capsule Take 300 mg by mouth at bedtime.    Historical Provider, MD  hydroxychloroquine (PLAQUENIL) 200 MG tablet Take 1 tablet by mouth Twice daily. 07/19/12   Historical Provider, MD  Hypromellose (GENTEAL OP) Place 1 drop into both eyes daily as needed. Tired Eyes    Historical Provider, MD  losartan (COZAAR) 50 MG tablet TAKE ONE TABLET BY MOUTH ONCE DAILY. 07/31/13   Laqueta Linden, MD  metoprolol succinate (TOPROL-XL) 50 MG 24 hr tablet Take 1 tablet (50 mg total) by mouth daily. Take with or immediately following a meal. 09/29/13   Laqueta Linden, MD  nitroGLYCERIN (NITROSTAT) 0.4 MG SL tablet Place 1 tablet (0.4 mg total) under the tongue every 5 (five) minutes as needed for chest pain. 01/16/13   Roger A Arguello, PA-C  potassium chloride (K-DUR,KLOR-CON) 10 MEQ tablet Take 10 mEq by mouth 2 (two) times daily.  07/24/13   Historical Provider, MD  predniSONE (DELTASONE) 5 MG tablet Take 5 mg by mouth daily.    Historical Provider, MD  raloxifene (EVISTA) 60 MG tablet Take 60 mg by mouth daily.    Historical Provider, MD  traMADol (ULTRAM) 50 MG tablet Take 1 tablet (50 mg total) by mouth every 6 (six) hours as needed for moderate pain. 08/26/13   Fredirick Maudlin, MD  warfarin (COUMADIN) 5 MG tablet Take 1 tablet daily except 1/2 tablet on Mondays and Thursdays 07/08/13   Laqueta Linden, MD   BP 121/85  Pulse 95  Temp(Src) 98 F (36.7 C) (Oral)  Resp 19  SpO2 98% Physical Exam 0845: Physical examination: Vital signs and O2 SAT: Reviewed; Constitutional: Well developed, Well nourished, Well hydrated, In  no acute distress; Head and Face: Normocephalic, Atraumatic; Eyes: EOMI, PERRL, No scleral icterus; ENMT: Mouth and pharynx normal, Left TM normal, Right TM normal, Mucous membranes moist; Neck: Supple, Trachea midline; Spine: +mild TTP bilat lumbar paraspinal muscles. No abrasions, no ecchymosis. No midline CS, TS, LS tenderness.; Cardiovascular: Irregular irregular rate and rhythm, No gallop; Respiratory: Breath sounds clear & equal bilaterally, No rales, rhonchi, wheezes, Normal respiratory effort/excursion; Chest: Nontender, No deformity, Movement normal, No crepitus, No abrasions or ecchymosis.; Abdomen: Soft, Nontender, Nondistended, Normal bowel sounds, No abrasions. +SQ hematoma left lower abd wall, no open wounds.; Genitourinary: No CVA tenderness;; Extremities: No deformity, Full range of motion major/large joints of bilat UE's and LE's without pain  or tenderness to palp, Neurovascularly intact, Pulses normal, No tenderness, No edema, NT right hip/knee/ankle/foot. No ecchymosis or abrasions. Pelvis stable; Neuro: AA&Ox3, GCS 15.  Major CN grossly intact. Speech clear. No gross focal motor or sensory deficits in extremities.; Skin: Color normal, Warm, Dry    ED Course  Procedures    EKG Interpretation None      MDM  MDM Reviewed: previous chart, nursing note and vitals Reviewed previous: labs Interpretation: labs, x-ray and CT scan    Results for orders placed during the hospital encounter of 12/14/13  PROTIME-INR      Result Value Ref Range   Prothrombin Time 26.3 (*) 11.6 - 15.2 seconds   INR 2.52 (*) 0.00 - 1.49  CBC WITH DIFFERENTIAL      Result Value Ref Range   WBC 9.2  4.0 - 10.5 K/uL   RBC 3.85 (*) 3.87 - 5.11 MIL/uL   Hemoglobin 11.2 (*) 12.0 - 15.0 g/dL   HCT 69.6 (*) 29.5 - 28.4 %   MCV 92.7  78.0 - 100.0 fL   MCH 29.1  26.0 - 34.0 pg   MCHC 31.4  30.0 - 36.0 g/dL   RDW 13.2  44.0 - 10.2 %   Platelets 198  150 - 400 K/uL   Neutrophils Relative % 73  43 - 77 %    Neutro Abs 6.7  1.7 - 7.7 K/uL   Lymphocytes Relative 19  12 - 46 %   Lymphs Abs 1.7  0.7 - 4.0 K/uL   Monocytes Relative 7  3 - 12 %   Monocytes Absolute 0.7  0.1 - 1.0 K/uL   Eosinophils Relative 1  0 - 5 %   Eosinophils Absolute 0.1  0.0 - 0.7 K/uL   Basophils Relative 0  0 - 1 %   Basophils Absolute 0.0  0.0 - 0.1 K/uL  BASIC METABOLIC PANEL      Result Value Ref Range   Sodium 146  137 - 147 mEq/L   Potassium 3.8  3.7 - 5.3 mEq/L   Chloride 104  96 - 112 mEq/L   CO2 31  19 - 32 mEq/L   Glucose, Bld 89  70 - 99 mg/dL   BUN 33 (*) 6 - 23 mg/dL   Creatinine, Ser 7.25 (*) 0.50 - 1.10 mg/dL   Calcium 9.9  8.4 - 36.6 mg/dL   GFR calc non Af Amer 29 (*) >90 mL/min   GFR calc Af Amer 33 (*) >90 mL/min   Ct Abdomen Pelvis Wo Contrast 12/14/2013   CLINICAL DATA:  Fall, on blood thinners with large left lower pelvis hematoma  EXAM: CT ABDOMEN AND PELVIS WITHOUT CONTRAST  TECHNIQUE: Multidetector CT imaging of the abdomen and pelvis was performed following the standard protocol without IV contrast.  COMPARISON:  Concurrently obtained radiographs of the lumbar spine  FINDINGS: Lower Chest: A mild dependent atelectasis. Marked cardiomegaly with biatrial and left ventricular enlargement. Small pericardial effusion. Atherosclerotic calcifications throughout the coronary arteries. The visualized esophagus is unremarkable.  Abdomen: Surgical staple line in the region of the gastric fundus. Otherwise, unremarkable CT appearance of the stomach, duodenum, spleen, adrenal glands and pancreas save for diffuse fatty atrophy. Normal hepatic contour and morphology. No discrete hepatic lesion or perihepatic fluid collection. Small stones in sludge layer within the gallbladder lumen. No gallbladder distention, wall thickening or pericholecystic fluid.  No hydronephrosis. Punctate nonobstructing stone in the right kidney at the junction of the enter and lower pole regions. There  is an adjacent parapelvic cyst. Small  high density focus measuring less a 1 cm exophytic from the lower pole likely represents a hemorrhagic cyst. There is a 1.5 cm intermediate attenuation lesion exophytic from the anterior lower pole which is indeterminate. Correlation with a remote prior MRI of the lumbar spine from 2011 demonstrates a cyst in this region of similar size. This likely represents a mildly complex renal cyst.  Sigmoid colonic diverticulosis. No focal bowel wall thickening or evidence of obstruction. No free fluid or suspicious adenopathy.  Pelvis: Surgical changes of prior hysterectomy. Unremarkable bladder. Pelvic floor laxity is present.  Bones/Soft Tissues: 4 x 3.1 x 2.9 cm high attenuation collection within the superficial subcutaneous fat of the left lower quadrant anterior to the anterior iliac spines consistent with soft tissue hematoma. There is surrounding stranding and contusion in the subcutaneous fat as well as thickening of the overlying skin. No propagation of the hematoma into the abdominal wall musculature or deep spaces.  Acute appearing compression fracture of L3 with 50% height loss. No significant posterior bony retropulsion. Stable remote L4 compression fracture. Advanced multilevel degenerative disc disease. The bones are osteopenic. Moderate to advanced multilevel lumbar facet arthropathy.  Vascular: Limited evaluation in the absence of intravenous contrast. Atherosclerotic vascular disease without significant stenosis or aneurysmal dilatation.  IMPRESSION: 1. Superficial subcutaneous hematoma in the left lower quadrant anterior abdominal wall without extension into the abdominal wall musculature or deep spaces. 2. Probable acute or subacute L3 compression fracture with 50% height loss but no significant posterior bony retropulsion and no evidence of a significant paraspinal hematoma. 3. Stable remote L4 compression fracture. 4. Cholelithiasis without features to suggest acute cholecystitis. 5. Colonic  diverticular disease without CT evidence of active inflammation. 6. Intermediate attenuation 1.5 cm lesion exophytic from the lower pole of the right kidney is incompletely evaluated in the absence of intravenous contrast. Correlation with the prior MRI of the lumbar spine from December of 2011 demonstrates a similarly sized cyst in in this region, however it is incompletely imaged. Statistically, this likely represents a mildly complex (previously hemorrhagic) cyst. However, and indolent renal neoplasm is difficult to exclude entirely. If clinically warranted, consider further evaluation with MRI with gadolinium if renal function permits. MRI without gadolinium would also be useful. 7. Marked cardiomegaly with biatrial and left ventricular dilatation. 8. Atherosclerosis including coronary artery disease. 9. Small pericardial effusion. 10. Additional ancillary findings as above.   Electronically Signed   By: Malachy Moan M.D.   On: 12/14/2013 10:36   Dg Chest 1 View 12/14/2013   CLINICAL DATA:  Fall.  EXAM: CHEST - 1 VIEW  COMPARISON:  08/21/2013  FINDINGS: Lungs are adequately inflated with mild elevation of the right hemidiaphragm unchanged. There is no consolidation or effusion. There is no pneumothorax. There is moderate stable cardiomegaly. Calcified plaque is present over the thoracic aorta. There is minimal curvature of the thoracic spine convex to the left with degenerative changes.  IMPRESSION: No acute cardiopulmonary disease.  Moderate stable cardiomegaly.   Electronically Signed   By: Elberta Fortis M.D.   On: 12/14/2013 10:29   Dg Lumbar Spine Complete 12/14/2013   CLINICAL DATA:  Fall.  EXAM: LUMBAR SPINE - COMPLETE 4+ VIEW  COMPARISON:  11/18/2013  FINDINGS: There is curvature of the lumbar spine convex to the right unchanged. There are stable compression fractures of L3 and L4. There is moderate spondylosis of the lumbar spine with multilevel disc space narrowing unchanged. There is diffuse  decreased  bone mineralization. There is a mild compression deformity of T11 unchanged. There are degenerative changes of the hips. Calcified plaque is present over the abdominal aorta. Remainder the exam is unchanged.  IMPRESSION: No acute findings.  Stable L3 and L4 compression fractures. Stable compression fracture T11.  Moderate spondylosis of the lumbar spinal multilevel disc disease.   Electronically Signed   By: Elberta Fortis M.D.   On: 12/14/2013 10:27   Dg Foot Complete Right 12/14/2013   CLINICAL DATA:  Fall.  EXAM: RIGHT FOOT COMPLETE - 3+ VIEW  COMPARISON:  None.  FINDINGS: Exam demonstrates moderate hallux valgus deformity with mild degenerative change over the first MTP joint. There are minimal degenerative changes of the midfoot and interphalangeal joints. There is no acute fracture or dislocation. Small vessel atherosclerotic disease is present.  IMPRESSION: No acute findings.   Electronically Signed   By: Elberta Fortis M.D.   On: 12/14/2013 10:24    1130:  BUN/Cr per baseline. INR therapeutic. Dx and testing d/w pt and family. Pt and her family already aware of the multiple incidental findings on CT scan above. Pt states she already has ultram and vicodin at home and does not need any other pain meds at this time. Pt already walks with a walker per baseline. She has ambulated with assist while in the ED with steady gait. Pt states she "just wanted to make sure I wasn't bleeding into my abdomen." Pt wants to go home now.  Questions answered.  Verb understanding, agreeable to d/c home with outpt f/u.   Laray Anger, DO 12/16/13 1141

## 2013-12-17 ENCOUNTER — Encounter: Payer: Self-pay | Admitting: Adult Health

## 2013-12-29 ENCOUNTER — Ambulatory Visit (INDEPENDENT_AMBULATORY_CARE_PROVIDER_SITE_OTHER): Payer: Medicare Other | Admitting: *Deleted

## 2013-12-29 DIAGNOSIS — Z5181 Encounter for therapeutic drug level monitoring: Secondary | ICD-10-CM

## 2013-12-29 DIAGNOSIS — I4891 Unspecified atrial fibrillation: Secondary | ICD-10-CM

## 2013-12-29 LAB — POCT INR: INR: 4.4

## 2014-01-08 ENCOUNTER — Ambulatory Visit (INDEPENDENT_AMBULATORY_CARE_PROVIDER_SITE_OTHER): Payer: Medicare Other | Admitting: *Deleted

## 2014-01-08 DIAGNOSIS — I482 Chronic atrial fibrillation, unspecified: Secondary | ICD-10-CM

## 2014-01-08 DIAGNOSIS — I4891 Unspecified atrial fibrillation: Secondary | ICD-10-CM

## 2014-01-08 DIAGNOSIS — Z5181 Encounter for therapeutic drug level monitoring: Secondary | ICD-10-CM

## 2014-01-08 LAB — PROTIME-INR: INR: 3.2 — AB (ref 0.9–1.1)

## 2014-01-12 ENCOUNTER — Emergency Department (HOSPITAL_COMMUNITY): Payer: Medicare Other

## 2014-01-12 ENCOUNTER — Telehealth: Payer: Self-pay

## 2014-01-12 ENCOUNTER — Encounter (HOSPITAL_COMMUNITY): Payer: Self-pay | Admitting: Emergency Medicine

## 2014-01-12 ENCOUNTER — Emergency Department (HOSPITAL_COMMUNITY)
Admission: EM | Admit: 2014-01-12 | Discharge: 2014-01-12 | Disposition: A | Discharge status: Home / Self Care | Payer: Medicare Other | Attending: Emergency Medicine | Admitting: Emergency Medicine

## 2014-01-12 DIAGNOSIS — M109 Gout, unspecified: Secondary | ICD-10-CM | POA: Insufficient documentation

## 2014-01-12 DIAGNOSIS — Z8781 Personal history of (healed) traumatic fracture: Secondary | ICD-10-CM | POA: Insufficient documentation

## 2014-01-12 DIAGNOSIS — I252 Old myocardial infarction: Secondary | ICD-10-CM | POA: Insufficient documentation

## 2014-01-12 DIAGNOSIS — R0600 Dyspnea, unspecified: Secondary | ICD-10-CM

## 2014-01-12 DIAGNOSIS — I4891 Unspecified atrial fibrillation: Secondary | ICD-10-CM | POA: Insufficient documentation

## 2014-01-12 DIAGNOSIS — R0989 Other specified symptoms and signs involving the circulatory and respiratory systems: Principal | ICD-10-CM | POA: Insufficient documentation

## 2014-01-12 DIAGNOSIS — R0609 Other forms of dyspnea: Secondary | ICD-10-CM | POA: Insufficient documentation

## 2014-01-12 DIAGNOSIS — Z79899 Other long term (current) drug therapy: Secondary | ICD-10-CM | POA: Insufficient documentation

## 2014-01-12 DIAGNOSIS — I10 Essential (primary) hypertension: Secondary | ICD-10-CM | POA: Insufficient documentation

## 2014-01-12 DIAGNOSIS — M069 Rheumatoid arthritis, unspecified: Secondary | ICD-10-CM | POA: Insufficient documentation

## 2014-01-12 DIAGNOSIS — Z7901 Long term (current) use of anticoagulants: Secondary | ICD-10-CM | POA: Insufficient documentation

## 2014-01-12 DIAGNOSIS — I5022 Chronic systolic (congestive) heart failure: Secondary | ICD-10-CM | POA: Insufficient documentation

## 2014-01-12 DIAGNOSIS — Z8719 Personal history of other diseases of the digestive system: Secondary | ICD-10-CM | POA: Insufficient documentation

## 2014-01-12 DIAGNOSIS — Z8542 Personal history of malignant neoplasm of other parts of uterus: Secondary | ICD-10-CM | POA: Insufficient documentation

## 2014-01-12 DIAGNOSIS — E785 Hyperlipidemia, unspecified: Secondary | ICD-10-CM | POA: Insufficient documentation

## 2014-01-12 DIAGNOSIS — Z880 Allergy status to penicillin: Secondary | ICD-10-CM | POA: Insufficient documentation

## 2014-01-12 DIAGNOSIS — Z9889 Other specified postprocedural states: Secondary | ICD-10-CM | POA: Insufficient documentation

## 2014-01-12 DIAGNOSIS — Z8739 Personal history of other diseases of the musculoskeletal system and connective tissue: Secondary | ICD-10-CM | POA: Insufficient documentation

## 2014-01-12 LAB — CBC WITH DIFFERENTIAL/PLATELET
Basophils Absolute: 0 10*3/uL (ref 0.0–0.1)
Basophils Relative: 1 % (ref 0–1)
EOS PCT: 2 % (ref 0–5)
Eosinophils Absolute: 0.1 10*3/uL (ref 0.0–0.7)
HCT: 37.8 % (ref 36.0–46.0)
HEMOGLOBIN: 11.9 g/dL — AB (ref 12.0–15.0)
LYMPHS ABS: 1.4 10*3/uL (ref 0.7–4.0)
LYMPHS PCT: 25 % (ref 12–46)
MCH: 29.2 pg (ref 26.0–34.0)
MCHC: 31.5 g/dL (ref 30.0–36.0)
MCV: 92.6 fL (ref 78.0–100.0)
Monocytes Absolute: 0.6 10*3/uL (ref 0.1–1.0)
Monocytes Relative: 10 % (ref 3–12)
NEUTROS PCT: 62 % (ref 43–77)
Neutro Abs: 3.6 10*3/uL (ref 1.7–7.7)
Platelets: 158 10*3/uL (ref 150–400)
RBC: 4.08 MIL/uL (ref 3.87–5.11)
RDW: 15.6 % — ABNORMAL HIGH (ref 11.5–15.5)
WBC: 5.7 10*3/uL (ref 4.0–10.5)

## 2014-01-12 LAB — BASIC METABOLIC PANEL
Anion gap: 11 (ref 5–15)
BUN: 33 mg/dL — ABNORMAL HIGH (ref 6–23)
CO2: 29 mEq/L (ref 19–32)
Calcium: 9.9 mg/dL (ref 8.4–10.5)
Chloride: 111 mEq/L (ref 96–112)
Creatinine, Ser: 1.59 mg/dL — ABNORMAL HIGH (ref 0.50–1.10)
GFR calc Af Amer: 35 mL/min — ABNORMAL LOW (ref >90)
GFR, EST NON AFRICAN AMERICAN: 30 mL/min — AB (ref >90)
Glucose, Bld: 88 mg/dL (ref 70–99)
Potassium: 4.1 mEq/L (ref 3.7–5.3)
SODIUM: 151 meq/L — AB (ref 137–147)

## 2014-01-12 LAB — TROPONIN I

## 2014-01-12 NOTE — Telephone Encounter (Signed)
Patient c/o SOB and chest tightness which occured last night and the night before.She has to sleep sitting up.I noted dyspnea on phone.I encouraged her to go to the ED,she agreed with dispo

## 2014-01-12 NOTE — ED Notes (Signed)
Pt reports sob and leg swelling x3 weeks. Pt reports increased sob last night. Pt denies any n/v/d,fevers. Pt reports intermittent productive cough.

## 2014-01-12 NOTE — Discharge Instructions (Signed)
Tests show no evidence of a heart attack or excessive fluid in your lungs. Recommend a modest increase in your daytime Lasix.  Followup your primary care Dr.

## 2014-01-12 NOTE — ED Notes (Signed)
Pt c/o intermittent mid-sternal chest pain as well as SOB x3 weeks. Pt also reports bilateral leg swelling. Symptoms have worsened over past 2-3 days. Pt reports hx of CHF and MI.

## 2014-01-12 NOTE — ED Provider Notes (Signed)
CSN: [id number]     Arrival date & time 01/12/14  1013 History  This chart was scribed for Marissa Hutching, MD by Ardelia Mems, ED Scribe. This patient was seen in room APA07/APA07 and the patient's care was started at 10:50 AM.   Chief Complaint  Patient presents with  . Shortness of Breath    The history is provided by the patient. No language interpreter was used.    HPI Comments: Marissa [name] is a 80 y.o. Female with a history of CHF, Afib and NSTEMI who presents to the Emergency Department complaining of constant, worsening SOB onset last night. She states that she was not able to sleep comfortably last night, and that she had to incline her body more than usual. She states that her SOB is worsened with exertion. She is concerned that she may be having an episode of CHF. She reports associated mild leg swelling today. She also reports mild associated chest pain, described as "tightness". Pt states that she takes 40 mg of Lasix BID.     Past Medical History  Diagnosis Date  . Permanent atrial fibrillation     A.  Chronic Coumadin  . MVP (mitral valve prolapse)   . Essential hypertension, benign   . Nonischemic cardiomyopathy 2001    A.  07/23/11 - Echo: EF 35%;  B. 07/24/2011 - Cath: nonobs dzs ef 35%, 2+MR C. Echo 01/06/13: EF 40-45%, mild LVH, mlid MR, mod biatrial enlargment, mild RV dilatation, mod TR, PASP 54 mmHg  . Systolic CHF, chronic     A. Diag 07/2011, EF 35%  . Lumbar disc disease     Laminectomy 2001  . History of endometrial cancer   . Hyperlipidemia   . Rheumatoid arthritis     Treated in past with Remicade and developed vasculitis  . Gout   . PONV (postoperative nausea and vomiting)   . NSTEMI (non-ST elevated myocardial infarction) 12/2012  . GERD (gastroesophageal reflux disease)   . Coronary artery dissection 12/2012    95% OM3 hazy lesion on cath, medically managed  . Lumbar compression fracture 2014    L3, L4   Past Surgical History  Procedure Laterality Date   . Appendectomy    . Abdominal hysterectomy  2000    Stage 1 endometrial cancer  . Hernia repair  09/2008  . Hernia repair  04/17/11    Lap ventral incisional hernia repair with mesh   . Gastric biopsy      Benign  . Excision of lipoma    . Lumbar laminectomy  2001  . Hammer toe surgery    . Cardiac catheterization  01/08/13    OM3 95% lesion felt to represent coronary dissection, mild luminal irregularities elsewhere; EF 40-45%; medically managed   Family History  Problem Relation Age of Onset  . Cancer Father    History  Substance Use Topics  . Smoking status: Never Smoker   . Smokeless tobacco: Never Used  . Alcohol Use: Yes     Comment: Rare   OB History   Grav Para Term Preterm Abortions TAB SAB Ect Mult Living                 Review of Systems A complete 10 system review of systems was obtained and all systems are negative except as noted in the HPI and PMH.   Allergies  Codeine and Penicillins  Home Medications   Prior to Admission medications   Medication Sig Start Date End Date Taking?  Authorizing Provider  atorvastatin (LIPITOR) 40 MG tablet TAKE ONE TABLET BY MOUTH ONCE DAILY. 05/13/13  Yes Laqueta Linden, MD  Cholecalciferol (VITAMIN D) 2000 UNITS CAPS Take 2,000 Units by mouth daily.   Yes Historical Provider, MD  digoxin (LANOXIN) 0.125 MG tablet Take 1 tablet (0.125 mg total) by mouth daily. 08/26/13  Yes Fredirick Maudlin, MD  febuxostat (ULORIC) 40 MG tablet Take 40 mg by mouth daily.   Yes Historical Provider, MD  furosemide (LASIX) 40 MG tablet Take 1 tablet (40 mg total) by mouth 2 (two) times daily. 11/19/13  Yes Jodelle Gross, NP  gabapentin (NEURONTIN) 300 MG capsule Take 300 mg by mouth at bedtime.   Yes Historical Provider, MD  hydroxychloroquine (PLAQUENIL) 200 MG tablet Take 1 tablet by mouth Twice daily. 07/19/12  Yes Historical Provider, MD  Hypromellose (GENTEAL OP) Place 1 drop into both eyes daily as needed. Tired Eyes   Yes Historical  Provider, MD  losartan (COZAAR) 50 MG tablet TAKE ONE TABLET BY MOUTH ONCE DAILY. 07/31/13  Yes Laqueta Linden, MD  metoprolol succinate (TOPROL-XL) 50 MG 24 hr tablet Take 1 tablet (50 mg total) by mouth daily. Take with or immediately following a meal. 09/29/13  Yes Laqueta Linden, MD  nitroGLYCERIN (NITROSTAT) 0.4 MG SL tablet Place 1 tablet (0.4 mg total) under the tongue every 5 (five) minutes as needed for chest pain. 01/16/13  Yes Roger A Arguello, PA-C  potassium chloride (K-DUR,KLOR-CON) 10 MEQ tablet Take 10 mEq by mouth 2 (two) times daily.  07/24/13  Yes Historical Provider, MD  predniSONE (DELTASONE) 1 MG tablet Take 3 mg by mouth daily with breakfast.   Yes Historical Provider, MD  Teriparatide, Recombinant, (FORTEO) 600 MCG/2.4ML SOLN Inject 20 mcg into the skin daily.   Yes Historical Provider, MD  traMADol (ULTRAM) 50 MG tablet Take 1 tablet (50 mg total) by mouth every 6 (six) hours as needed for moderate pain. 08/26/13  Yes Fredirick Maudlin, MD  warfarin (COUMADIN) 5 MG tablet Take 2.5-5 mg by mouth daily. Take 1 tablet daily except 1/2 tablet on Mondays and Thursdays 07/08/13  Yes Laqueta Linden, MD   Triage Vitals: BP 110/80  Pulse 122  Temp(Src) 97.6 F (36.4 C) (Oral)  Resp 20  Ht 5\' 5"  (1.651 m)  Wt 174 lb (78.926 kg)  BMI 28.96 kg/m2  SpO2 95%  Physical Exam  Nursing note and vitals reviewed. Constitutional: She is oriented to person, place, and time. She appears well-developed and well-nourished.  HENT:  Head: Normocephalic and atraumatic.  Eyes: Conjunctivae and EOM are normal. Pupils are equal, round, and reactive to light.  Neck: Normal range of motion. Neck supple.  Cardiovascular: Normal rate, regular rhythm and normal heart sounds.   Pulmonary/Chest: Effort normal and breath sounds normal.  Abdominal: Soft. Bowel sounds are normal.  Musculoskeletal: Normal range of motion. She exhibits edema.  2+ peripheral edema, baseline per daughter   Neurological: She is alert and oriented to person, place, and time.  Skin: Skin is warm and dry.  Psychiatric: She has a normal mood and affect. Her behavior is normal.    ED Course  Procedures (including critical care time)  DIAGNOSTIC STUDIES: Oxygen Saturation is 95% on RA, adequate by my interpretation.    COORDINATION OF CARE: 10:53 AM- Discussed plan to order a CXR, EKG and blood work. Pt advised of plan for treatment and pt agrees.  Results for orders placed during the hospital encounter of  01/12/14  BASIC METABOLIC PANEL      Result Value Ref Range   Sodium 151 (*) 137 - 147 mEq/L   Potassium 4.1  3.7 - 5.3 mEq/L   Chloride 111  96 - 112 mEq/L   CO2 29  19 - 32 mEq/L   Glucose, Bld 88  70 - 99 mg/dL   BUN 33 (*) 6 - 23 mg/dL   Creatinine, Ser 0.38 (*) 0.50 - 1.10 mg/dL   Calcium 9.9  8.4 - 88.2 mg/dL   GFR calc non Af Amer 30 (*) >90 mL/min   GFR calc Af Amer 35 (*) >90 mL/min   Anion gap 11  5 - 15  CBC WITH DIFFERENTIAL      Result Value Ref Range   WBC 5.7  4.0 - 10.5 K/uL   RBC 4.08  3.87 - 5.11 MIL/uL   Hemoglobin 11.9 (*) 12.0 - 15.0 g/dL   HCT 80.0  34.9 - 17.9 %   MCV 92.6  78.0 - 100.0 fL   MCH 29.2  26.0 - 34.0 pg   MCHC 31.5  30.0 - 36.0 g/dL   RDW 15.0 (*) 56.9 - 79.4 %   Platelets 158  150 - 400 K/uL   Neutrophils Relative % 62  43 - 77 %   Neutro Abs 3.6  1.7 - 7.7 K/uL   Lymphocytes Relative 25  12 - 46 %   Lymphs Abs 1.4  0.7 - 4.0 K/uL   Monocytes Relative 10  3 - 12 %   Monocytes Absolute 0.6  0.1 - 1.0 K/uL   Eosinophils Relative 2  0 - 5 %   Eosinophils Absolute 0.1  0.0 - 0.7 K/uL   Basophils Relative 1  0 - 1 %   Basophils Absolute 0.0  0.0 - 0.1 K/uL  TROPONIN I      Result Value Ref Range   Troponin I <0.30  <0.30 ng/mL    Dg Chest 2 View  01/12/2014   CLINICAL DATA:  Shortness of breath and chest pain.  EXAM: CHEST  2 VIEW  COMPARISON:  Single view of the chest 12/15/1998 is seen. PA and lateral chest 08/03/2012.  FINDINGS:  Elevation of the right hemidiaphragm is again seen. There is cardiomegaly without edema. Lungs are clear. No pneumothorax or pleural effusion. No focal bony abnormality.  IMPRESSION: Cardiomegaly without acute disease.  Stable compared to prior exam.   Electronically Signed   By: Drusilla Kanner M.D.   On: 01/12/2014 11:48     EKG Interpretation   Date/Time:  Monday January 12 2014 10:36:09 EDT Ventricular Rate:  108 PR Interval:  108 QRS Duration: 109 QT Interval:  412 QTC Calculation: 552 R Axis:   -143 Text Interpretation:  Right and left arm electrode reversal,  interpretation assumes no reversal Sinus tachycardia Ventricular premature  complex Low voltage with right axis deviation Abnormal R-wave progression,  late transition Prolonged QT interval Confirmed by Adriana Simas  MD, Malin Cervini ([id number])  on 01/12/2014 1:29:50 PM      MDM   Final diagnoses:  Dyspnea    Patient is in no acute distress. Normal respirations and color. Chest x-ray shows no evidence of heart failure.  Recommend minimal increase in diuretic.  Normal pulse at discharge.   I personally performed the services described in this documentation, which was scribed in my presence. The recorded information has been reviewed and is accurate.   Marissa Hutching, MD 01/13/14 [phone]

## 2014-01-13 ENCOUNTER — Telehealth: Payer: Self-pay

## 2014-01-13 MED ORDER — METOPROLOL SUCCINATE ER 50 MG PO TB24: ORAL_TABLET | ORAL | Status: DC

## 2014-01-13 NOTE — Telephone Encounter (Signed)
Received call from [place], [facility]. Pt was seen in ED 01/13/14 for SOB. She is at patient's home and notes that patient is tachycardic and sob with little effort exerted.Per K.Lawrence NP, she has advised increase in Toprol XL 50 mg to 75 mg (1 1/2 tabs) daily and fu with Ms.Lawrence NP on 01/23/14 at 1:30 pm   I spoke with Tripp , pharmacist at [facility] and he indicated pt could cut pill

## 2014-01-16 ENCOUNTER — Encounter (HOSPITAL_COMMUNITY): Payer: Self-pay | Admitting: Emergency Medicine

## 2014-01-16 ENCOUNTER — Emergency Department (HOSPITAL_COMMUNITY)
Admission: EM | Admit: 2014-01-16 | Discharge: 2014-01-16 | Disposition: A | Discharge status: Home / Self Care | Payer: Medicare Other | Attending: Emergency Medicine | Admitting: Emergency Medicine

## 2014-01-16 ENCOUNTER — Emergency Department (HOSPITAL_COMMUNITY): Payer: Medicare Other

## 2014-01-16 DIAGNOSIS — I5042 Chronic combined systolic (congestive) and diastolic (congestive) heart failure: Secondary | ICD-10-CM

## 2014-01-16 DIAGNOSIS — I495 Sick sinus syndrome: Secondary | ICD-10-CM

## 2014-01-16 DIAGNOSIS — Z8542 Personal history of malignant neoplasm of other parts of uterus: Secondary | ICD-10-CM | POA: Insufficient documentation

## 2014-01-16 DIAGNOSIS — I25119 Atherosclerotic heart disease of native coronary artery with unspecified angina pectoris: Secondary | ICD-10-CM

## 2014-01-16 DIAGNOSIS — I10 Essential (primary) hypertension: Secondary | ICD-10-CM | POA: Insufficient documentation

## 2014-01-16 DIAGNOSIS — I2510 Atherosclerotic heart disease of native coronary artery without angina pectoris: Secondary | ICD-10-CM

## 2014-01-16 DIAGNOSIS — R0609 Other forms of dyspnea: Secondary | ICD-10-CM | POA: Insufficient documentation

## 2014-01-16 DIAGNOSIS — R001 Bradycardia, unspecified: Secondary | ICD-10-CM

## 2014-01-16 DIAGNOSIS — I428 Other cardiomyopathies: Secondary | ICD-10-CM

## 2014-01-16 DIAGNOSIS — Z880 Allergy status to penicillin: Secondary | ICD-10-CM | POA: Insufficient documentation

## 2014-01-16 DIAGNOSIS — R0602 Shortness of breath: Secondary | ICD-10-CM

## 2014-01-16 DIAGNOSIS — I4891 Unspecified atrial fibrillation: Secondary | ICD-10-CM | POA: Insufficient documentation

## 2014-01-16 DIAGNOSIS — IMO0002 Reserved for concepts with insufficient information to code with codable children: Secondary | ICD-10-CM | POA: Insufficient documentation

## 2014-01-16 DIAGNOSIS — R079 Chest pain, unspecified: Secondary | ICD-10-CM | POA: Insufficient documentation

## 2014-01-16 DIAGNOSIS — I209 Angina pectoris, unspecified: Secondary | ICD-10-CM

## 2014-01-16 DIAGNOSIS — M069 Rheumatoid arthritis, unspecified: Secondary | ICD-10-CM

## 2014-01-16 DIAGNOSIS — Z8719 Personal history of other diseases of the digestive system: Secondary | ICD-10-CM | POA: Insufficient documentation

## 2014-01-16 DIAGNOSIS — M519 Unspecified thoracic, thoracolumbar and lumbosacral intervertebral disc disorder: Secondary | ICD-10-CM

## 2014-01-16 DIAGNOSIS — I252 Old myocardial infarction: Secondary | ICD-10-CM | POA: Insufficient documentation

## 2014-01-16 DIAGNOSIS — Z9889 Other specified postprocedural states: Secondary | ICD-10-CM | POA: Insufficient documentation

## 2014-01-16 DIAGNOSIS — R609 Edema, unspecified: Secondary | ICD-10-CM | POA: Insufficient documentation

## 2014-01-16 DIAGNOSIS — I498 Other specified cardiac arrhythmias: Secondary | ICD-10-CM

## 2014-01-16 DIAGNOSIS — I9589 Other hypotension: Secondary | ICD-10-CM

## 2014-01-16 DIAGNOSIS — R0600 Dyspnea, unspecified: Secondary | ICD-10-CM

## 2014-01-16 DIAGNOSIS — I519 Heart disease, unspecified: Secondary | ICD-10-CM

## 2014-01-16 DIAGNOSIS — Z8739 Personal history of other diseases of the musculoskeletal system and connective tissue: Secondary | ICD-10-CM | POA: Insufficient documentation

## 2014-01-16 DIAGNOSIS — Z79899 Other long term (current) drug therapy: Secondary | ICD-10-CM | POA: Insufficient documentation

## 2014-01-16 DIAGNOSIS — Z7901 Long term (current) use of anticoagulants: Secondary | ICD-10-CM | POA: Insufficient documentation

## 2014-01-16 DIAGNOSIS — M109 Gout, unspecified: Secondary | ICD-10-CM | POA: Insufficient documentation

## 2014-01-16 DIAGNOSIS — I509 Heart failure, unspecified: Secondary | ICD-10-CM

## 2014-01-16 DIAGNOSIS — R0989 Other specified symptoms and signs involving the circulatory and respiratory systems: Principal | ICD-10-CM | POA: Insufficient documentation

## 2014-01-16 DIAGNOSIS — I5022 Chronic systolic (congestive) heart failure: Secondary | ICD-10-CM | POA: Insufficient documentation

## 2014-01-16 DIAGNOSIS — E785 Hyperlipidemia, unspecified: Secondary | ICD-10-CM | POA: Insufficient documentation

## 2014-01-16 DIAGNOSIS — Z8781 Personal history of (healed) traumatic fracture: Secondary | ICD-10-CM | POA: Insufficient documentation

## 2014-01-16 DIAGNOSIS — I482 Chronic atrial fibrillation, unspecified: Secondary | ICD-10-CM

## 2014-01-16 LAB — CBC WITH DIFFERENTIAL/PLATELET
Basophils Absolute: 0 10*3/uL (ref 0.0–0.1)
Basophils Relative: 1 % (ref 0–1)
Eosinophils Absolute: 0.1 10*3/uL (ref 0.0–0.7)
Eosinophils Relative: 1 % (ref 0–5)
HEMATOCRIT: 36.4 % (ref 36.0–46.0)
HEMOGLOBIN: 11.7 g/dL — AB (ref 12.0–15.0)
Lymphocytes Relative: 26 % (ref 12–46)
Lymphs Abs: 1.6 10*3/uL (ref 0.7–4.0)
MCH: 29.5 pg (ref 26.0–34.0)
MCHC: 32.1 g/dL (ref 30.0–36.0)
MCV: 91.9 fL (ref 78.0–100.0)
MONO ABS: 0.5 10*3/uL (ref 0.1–1.0)
MONOS PCT: 8 % (ref 3–12)
NEUTROS ABS: 4.2 10*3/uL (ref 1.7–7.7)
Neutrophils Relative %: 64 % (ref 43–77)
Platelets: 163 10*3/uL (ref 150–400)
RBC: 3.96 MIL/uL (ref 3.87–5.11)
RDW: 15.6 % — ABNORMAL HIGH (ref 11.5–15.5)
WBC: 6.4 10*3/uL (ref 4.0–10.5)

## 2014-01-16 LAB — BASIC METABOLIC PANEL
Anion gap: 14 (ref 5–15)
BUN: 41 mg/dL — AB (ref 6–23)
CHLORIDE: 108 meq/L (ref 96–112)
CO2: 24 mEq/L (ref 19–32)
Calcium: 9.6 mg/dL (ref 8.4–10.5)
Creatinine, Ser: 2.16 mg/dL — ABNORMAL HIGH (ref 0.50–1.10)
GFR calc Af Amer: 24 mL/min — ABNORMAL LOW (ref >90)
GFR calc non Af Amer: 21 mL/min — ABNORMAL LOW (ref >90)
GLUCOSE: 145 mg/dL — AB (ref 70–99)
Potassium: 4 mEq/L (ref 3.7–5.3)
Sodium: 146 mEq/L (ref 137–147)

## 2014-01-16 LAB — TROPONIN I: Troponin I: <0.3 ng/mL (ref <0.30)

## 2014-01-16 MED ORDER — ACETAMINOPHEN 500 MG PO TABS
1000.0000 mg | ORAL_TABLET | Freq: Once | ORAL | Status: AC
  Administered 2014-01-16: 1000 mg via ORAL
  Filled 2014-01-16: qty 2

## 2014-01-16 NOTE — Discharge Instructions (Signed)
New prescription for Isosorbide Dinitrate.   Other recommendations per cardiologist.

## 2014-01-16 NOTE — ED Provider Notes (Addendum)
CSN: [id number]     Arrival date & time 01/16/14  5176 History  This chart was scribed for [name], MD by Leone Payor, ED Scribe. This patient was seen in room APA06/APA06 and the patient's care was started 10:12 AM.    Chief Complaint  Patient presents with  . Shortness of Breath  . Chest Pain    The history is provided by the patient and a relative. No language interpreter was used.    HPI Comments: Marissa [name] is a 80 y.o. female with past medical history of HTN, NSTEMI, CHF, Afib, HLD who presents to the Emergency Department complaining of continued, constant, unchanged SOB for the past 4 days. Patient was seen on 01/12/14 for the same symptoms when she had negative testing and was discharged home. Patient states the SOB persists and she is having associated episodes of chest pain. She reports taking 1 nitro yesterday with relief and 2 today without relief. Per daughter, patient has not taken her metoprolol today and has noticed a low HR in the 50's. Patient states she has increased her Lasix dose by 0.5 tablet without relief.   Past Medical History  Diagnosis Date  . Permanent atrial fibrillation     A.  Chronic Coumadin  . MVP (mitral valve prolapse)   . Essential hypertension, benign   . Nonischemic cardiomyopathy 2001    A.  07/23/11 - Echo: EF 35%;  B. 07/24/2011 - Cath: nonobs dzs ef 35%, 2+MR C. Echo 01/06/13: EF 40-45%, mild LVH, mlid MR, mod biatrial enlargment, mild RV dilatation, mod TR, PASP 54 mmHg  . Systolic CHF, chronic     A. Diag 07/2011, EF 35%  . Lumbar disc disease     Laminectomy 2001  . History of endometrial cancer   . Hyperlipidemia   . Rheumatoid arthritis     Treated in past with Remicade and developed vasculitis  . Gout   . PONV (postoperative nausea and vomiting)   . NSTEMI (non-ST elevated myocardial infarction) 12/2012  . GERD (gastroesophageal reflux disease)   . Coronary artery dissection 12/2012    95% OM3 hazy lesion on cath, medically managed   . Lumbar compression fracture 2014    L3, L4   Past Surgical History  Procedure Laterality Date  . Appendectomy    . Abdominal hysterectomy  2000    Stage 1 endometrial cancer  . Hernia repair  09/2008  . Hernia repair  04/17/11    Lap ventral incisional hernia repair with mesh   . Gastric biopsy      Benign  . Excision of lipoma    . Lumbar laminectomy  2001  . Hammer toe surgery    . Cardiac catheterization  01/08/13    OM3 95% lesion felt to represent coronary dissection, mild luminal irregularities elsewhere; EF 40-45%; medically managed   Family History  Problem Relation Age of Onset  . Cancer Father    History  Substance Use Topics  . Smoking status: Never Smoker   . Smokeless tobacco: Never Used  . Alcohol Use: Yes     Comment: Rare   OB History   Grav Para Term Preterm Abortions TAB SAB Ect Mult Living                 Review of Systems  A complete 10 system review of systems was obtained and all systems are negative except as noted in the HPI and PMH.    Allergies  Codeine and Penicillins  Home Medications   Prior to Admission medications   Medication Sig Start Date End Date Taking? Authorizing Provider  atorvastatin (LIPITOR) 40 MG tablet TAKE ONE TABLET BY MOUTH ONCE DAILY. 05/13/13  Yes Laqueta Linden, MD  Cholecalciferol (VITAMIN D) 2000 UNITS CAPS Take 2,000 Units by mouth daily.   Yes Historical Provider, MD  digoxin (LANOXIN) 0.125 MG tablet Take 1 tablet (0.125 mg total) by mouth daily. 08/26/13  Yes Fredirick Maudlin, MD  febuxostat (ULORIC) 40 MG tablet Take 40 mg by mouth daily.   Yes Historical Provider, MD  furosemide (LASIX) 40 MG tablet Take 1 tablet (40 mg total) by mouth 2 (two) times daily. 11/19/13  Yes Jodelle Gross, NP  gabapentin (NEURONTIN) 300 MG capsule Take 300 mg by mouth at bedtime.   Yes Historical Provider, MD  hydroxychloroquine (PLAQUENIL) 200 MG tablet Take 1 tablet by mouth Twice daily. 07/19/12  Yes Historical Provider,  MD  metoprolol succinate (TOPROL-XL) 50 MG 24 hr tablet Take  1 1/2 tabs daily (75 mg) 01/13/14  Yes Jodelle Gross, NP  potassium chloride (K-DUR,KLOR-CON) 10 MEQ tablet Take 10 mEq by mouth 2 (two) times daily.  07/24/13  Yes Historical Provider, MD  Hypromellose (GENTEAL OP) Place 1 drop into both eyes daily as needed. Tired Eyes    Historical Provider, MD  losartan (COZAAR) 50 MG tablet TAKE ONE TABLET BY MOUTH ONCE DAILY. 07/31/13   Laqueta Linden, MD  nitroGLYCERIN (NITROSTAT) 0.4 MG SL tablet Place 1 tablet (0.4 mg total) under the tongue every 5 (five) minutes as needed for chest pain. 01/16/13   Roger A Arguello, PA-C  predniSONE (DELTASONE) 1 MG tablet Take 3 mg by mouth daily with breakfast.    Historical Provider, MD  Teriparatide, Recombinant, (FORTEO) 600 MCG/2.4ML SOLN Inject 20 mcg into the skin daily.    Historical Provider, MD  traMADol (ULTRAM) 50 MG tablet Take 1 tablet (50 mg total) by mouth every 6 (six) hours as needed for moderate pain. 08/26/13   Fredirick Maudlin, MD  warfarin (COUMADIN) 5 MG tablet Take 2.5-5 mg by mouth daily. Take 1 tablet daily except 1/2 tablet on Mondays and Thursdays 07/08/13   Laqueta Linden, MD   BP 104/60  Pulse 57  Temp(Src) 97.6 F (36.4 C) (Oral)  Resp 16  Ht 5\' 5"  (1.651 m)  Wt 175 lb (79.379 kg)  BMI 29.12 kg/m2  SpO2 100% Physical Exam  Nursing note and vitals reviewed. Constitutional: She is oriented to person, place, and time. She appears well-developed and well-nourished.  HENT:  Head: Normocephalic and atraumatic.  Eyes: Conjunctivae and EOM are normal. Pupils are equal, round, and reactive to light.  Neck: Normal range of motion. Neck supple.  Cardiovascular: Normal rate, regular rhythm and normal heart sounds.   Pulmonary/Chest: Effort normal and breath sounds normal. No respiratory distress.  Abdominal: Soft. Bowel sounds are normal.  Musculoskeletal: Normal range of motion. She exhibits edema (2-3+ peripheral  edema).  Neurological: She is alert and oriented to person, place, and time.  Skin: Skin is warm and dry.  Psychiatric: She has a normal mood and affect. Her behavior is normal.    ED Course  Procedures (including critical care time)  DIAGNOSTIC STUDIES: Oxygen Saturation is 96% on RA, adequate by my interpretation.    COORDINATION OF CARE: 10:27 AM Will consult cardiologist. Discussed treatment plan with pt at bedside and pt agreed to plan.   Labs Review Labs Reviewed  CBC WITH  DIFFERENTIAL - Abnormal; Notable for the following:    Hemoglobin 11.7 (*)    RDW 15.6 (*)    All other components within normal limits  BASIC METABOLIC PANEL - Abnormal; Notable for the following:    Glucose, Bld 145 (*)    BUN 41 (*)    Creatinine, Ser 2.16 (*)    GFR calc non Af Amer 21 (*)    GFR calc Af Amer 24 (*)    All other components within normal limits  TROPONIN I    Imaging Review Dg Chest 2 View  01/16/2014   CLINICAL DATA:  Chest pain.  Shortness of breath.  EXAM: CHEST  2 VIEW  COMPARISON:  Two-view chest 01/12/2014  FINDINGS: The heart is enlarged. There is no edema or effusion to suggest failure. Minimal atelectasis at the right base is noted. Eventration of the right hemidiaphragm is stable. Atherosclerotic calcifications are again noted within the aorta. The visualized soft tissues and bony thorax are otherwise unremarkable.  IMPRESSION: 1. Stable cardiomegaly without failure. 2. Stable right lower lobe atelectasis. 3. No acute cardiopulmonary disease.   Electronically Signed   By: Gennette Pac M.D.   On: 01/16/2014 11:04     EKG Interpretation None      MDM   Final diagnoses:  Dyspnea    Patient presents with dyspnea and minimal extremity edema. She was recently seen in emergency department for similar symptoms. Additionally her cardiologist has recommended increasing Lasix and increasing metoprolol. Will obtain cardiology consult. Patient is in no acute distress.     1415   discussed cardiology recommendations which included: decreasing the Toprol-XL to 50 mg daily, starting isosorbide dinitrate 10 mg twice a day, decreasing Cozaar to 25 mg daily, continuing Lasix at 40 mg twice a day, stopping NSAIDs, follow up office visit on Monday at 2:50 PM I personally performed the services described in this documentation, which was scribed in my presence. The recorded information has been reviewed and is accurate.   [name], MD 01/16/14 1336  [name], MD 01/16/14 [phone]

## 2014-01-16 NOTE — Consult Note (Signed)
The patient was seen and examined, and I agree with the assessment and plan as documented above. Pt is well known to me from the office. Significant history includes a NSTEMI in 01/2013 with the culprit vessel being a tortuous OM3. This was medically managed as the risks of PCI outweighed the benefits. The remaining epicardial coronary arteries were normal. She has severely reduced LV systolic function which I felt may be due to a tachycardia-mediated cardiomyopathy, with a plan to reassess in the near future. She has atrial fibrillation and a prior history of tachycardia-bradycardia syndrome. She also has a history of rheumatoid arthritis dating back to her early 76's. She has had repeated ED presentations for chest pain and shortness of breath. She was recently started on Forteo and has had multiple complaints since that time. She is taking Toprol-XL 50 mg q am and 25 mg q pm, and Lasix 40 mg bid. HR's currently in high 40 to low 50 bpm range. ECG shows sinus bradycardia. CXR normal.  Her chest pain could potentially be due to the OM3 lesion, in which PCI would not be performed.  Her SOB could be multifactorial including both severely reduced LV systolic function, as well as rheumatoid lung disease.  RECS: I had a long discussion with both the patient and [name], her daughter. Will d/c evening dose of Toprol-XL. D/c Forteo. Start isosorbide dinitrate 10 mg bid (reduced dose) and give low normal BP, reduce losartan to 25 mg daily. She will see K. Lawrence NP on Monday in clinic, 7/13.

## 2014-01-16 NOTE — ED Notes (Addendum)
Chest pain and sob for 4 days.   Seen here Monday with same.  Used 2 sl ntg at home with some relief.  Denies any chest pain at present. Also c/o increase edema in feet. States they increased her betablocker and she feels like her heart rate is too low.  Spoke with [facility] pa this morning -Katherine.  And Natalia Leatherwood want to be called when pt gets here to ed.

## 2014-01-16 NOTE — Consult Note (Signed)
CARDIOLOGY CONSULT NOTE   Patient ID: Marissa [name] MRN: [id number] DOB/AGE: 1932-10-17 80 y.o.  Admit Date: 01/16/2014 Referring Physician: [name], MD Primary Physician: Carylon Perches, MD Consulting Cardiologist: Prentice Docker MD Primary Cardiologist: Purvis Sheffield, MD Reason for Consultation: Bradycardia and chest pain, and shortness of breath  Clinical Summary Marissa [name] is a 80 y.o.female welll known to our practice with known CAD, Systolic dysfunction, EF of 20-25%, Atrial fib on coumadin, with other history to include rheumatoid arthritis, recent lumbar spine fx, who has been placed on Forteo, antiinflammatory, and prednisone. She has had frequent ER visits for re urrent chest pain tachycardia, and shortness of breath. She has also been treated over the phone with medication adjustments.   She comes today with complaints of shortness of breath, low BP and bradycardia. She did not take her metoprolol today. Her daughter is a Engineer, civil (consulting) who is also watching her medications and she has a Comptroller who is there with her during the day. She has had some chest discomfort today prompting this ER visit,.    Allergies  Allergen Reactions  . Codeine Other (See Comments)    Makes patient sleepy.  Marland Kitchen Penicillins Rash    Medications Scheduled Medications:    Infusions:    PRN Medications:      Past Medical History  Diagnosis Date  . Permanent atrial fibrillation     A.  Chronic Coumadin  . MVP (mitral valve prolapse)   . Essential hypertension, benign   . Nonischemic cardiomyopathy 2001    A.  07/23/11 - Echo: EF 35%;  B. 07/24/2011 - Cath: nonobs dzs ef 35%, 2+MR C. Echo 01/06/13: EF 40-45%, mild LVH, mlid MR, mod biatrial enlargment, mild RV dilatation, mod TR, PASP 54 mmHg  . Systolic CHF, chronic     A. Diag 07/2011, EF 35%  . Lumbar disc disease     Laminectomy 2001  . History of endometrial cancer   . Hyperlipidemia   . Rheumatoid arthritis     Treated in past with  Remicade and developed vasculitis  . Gout   . PONV (postoperative nausea and vomiting)   . NSTEMI (non-ST elevated myocardial infarction) 12/2012  . GERD (gastroesophageal reflux disease)   . Coronary artery dissection 12/2012    95% OM3 hazy lesion on cath, medically managed  . Lumbar compression fracture 2014    L3, L4    Past Surgical History  Procedure Laterality Date  . Appendectomy    . Abdominal hysterectomy  2000    Stage 1 endometrial cancer  . Hernia repair  09/2008  . Hernia repair  04/17/11    Lap ventral incisional hernia repair with mesh   . Gastric biopsy      Benign  . Excision of lipoma    . Lumbar laminectomy  2001  . Hammer toe surgery    . Cardiac catheterization  01/08/13    OM3 95% lesion felt to represent coronary dissection, mild luminal irregularities elsewhere; EF 40-45%; medically managed    Family History  Problem Relation Age of Onset  . Cancer Father     Social History Marissa [name] reports that she has never smoked. She has never used smokeless tobacco. Marissa [name] reports that she drinks alcohol.  Review of Systems Otherwise reviewed and negative except as outlined.  Physical Examination Blood pressure 97/63, pulse 51, temperature 97.6 F (36.4 C), temperature source Oral, resp. rate 17, height 5\' 5"  (1.651 m), weight 175 lb (79.379 kg), SpO2 99.00%. No intake  or output data in the 24 hours ending 01/16/14 1228  Telemetry:Sinus bradycardia, rate of 54 bpm.  WHQ:[id number] with back pain. No further chest pain. HEENT: Conjunctiva and lids normal, oropharynx clear with moist mucosa. Neck: Supple, no elevated JVP or carotid bruits, no thyromegaly. Lungs: Clear to auscultation, nonlabored breathing at rest. Cardiac: Regular rate and rhythm, no S3 or significant systolic murmur, no pericardial rub. Abdomen: Soft, nontender, no hepatomegaly, bowel sounds present, no guarding or rebound. Extremities: No pitting edema, distal pulses 2+. Skin:  Warm and dry. Musculoskeletal: No kyphosis. Neuropsychiatric: Alert and oriented x3, affect grossly appropriate.  Prior Cardiac Testing/Procedures  Lab Results  Basic Metabolic Panel:  Recent Labs Lab 01/12/14 1108 01/16/14 0953  NA 151* 146  K 4.1 4.0  CL 111 108  CO2 29 24  GLUCOSE 88 145*  BUN 33* 41*  CREATININE 1.59* 2.16*  CALCIUM 9.9 9.6    Liver Function Tests: No results found for this basename: AST, ALT, ALKPHOS, BILITOT, PROT, ALBUMIN,  in the last 168 hours  CBC:  Recent Labs Lab 01/12/14 1108 01/16/14 0953  WBC 5.7 6.4  NEUTROABS 3.6 4.2  HGB 11.9* 11.7*  HCT 37.8 36.4  MCV 92.6 91.9  PLT 158 163    Cardiac Enzymes:  Recent Labs Lab 01/12/14 1108 01/16/14 0953  TROPONINI <0.30 <0.30     Radiology: Dg Chest 2 View  01/16/2014   CLINICAL DATA:  Chest pain.  Shortness of breath.  EXAM: CHEST  2 VIEW  COMPARISON:  Two-view chest 01/12/2014  FINDINGS: The heart is enlarged. There is no edema or effusion to suggest failure. Minimal atelectasis at the right base is noted. Eventration of the right hemidiaphragm is stable. Atherosclerotic calcifications are again noted within the aorta. The visualized soft tissues and bony thorax are otherwise unremarkable.  IMPRESSION: 1. Stable cardiomegaly without failure. 2. Stable right lower lobe atelectasis. 3. No acute cardiopulmonary disease.   Electronically Signed   By: Gennette Pac M.D.   On: 01/16/2014 11:04     JTT:SVXBL bradycardia, 58 bpm.    Impression and Recommendations  1. Bradycardia;  Will decrease Metoprolol XL to 50 mg daily. She is not to take it today.   2.Chest Pain: Will begin isosorbide dinitrate 10 mg BID.   3. Hypotension: Decrease Cozaar to 25 mg daily.   4. NICM: Lasix will remain the same at 40 mg BID She is advised to stop NSAID as kidney fx is adversely affected. She will be seen in our office on Monday at 2:50 pm on Monday with BMET. Will need to reassess her LV fx. Will  discuss this on follow up. She will need follow up BMET.    This has been discussed with Dr. Purvis Sheffield and Dr. Adriana Simas. Rx provided for isosorbide dinitrate, She is made aware of appt on Monday. If symptoms persist or worsen, she is to go to [facility] over the weekend.    Signed: Bettey Mare. Kenneith Stief NP  01/16/2014, 12:28 PM Co-Sign MD

## 2014-01-19 ENCOUNTER — Encounter: Payer: Self-pay | Admitting: Adult Health

## 2014-01-19 ENCOUNTER — Ambulatory Visit (INDEPENDENT_AMBULATORY_CARE_PROVIDER_SITE_OTHER): Payer: Medicare Other | Admitting: Adult Health

## 2014-01-19 VITALS — BP 108/68 | HR 55 | Ht 65.0 in | Wt 175.0 lb

## 2014-01-19 DIAGNOSIS — M069 Rheumatoid arthritis, unspecified: Secondary | ICD-10-CM

## 2014-01-19 DIAGNOSIS — I4891 Unspecified atrial fibrillation: Secondary | ICD-10-CM

## 2014-01-19 DIAGNOSIS — I428 Other cardiomyopathies: Secondary | ICD-10-CM

## 2014-01-19 DIAGNOSIS — R1012 Left upper quadrant pain: Secondary | ICD-10-CM

## 2014-01-19 DIAGNOSIS — I482 Chronic atrial fibrillation, unspecified: Secondary | ICD-10-CM

## 2014-01-19 DIAGNOSIS — I429 Cardiomyopathy, unspecified: Secondary | ICD-10-CM

## 2014-01-19 DIAGNOSIS — R109 Unspecified abdominal pain: Secondary | ICD-10-CM | POA: Insufficient documentation

## 2014-01-19 DIAGNOSIS — I2510 Atherosclerotic heart disease of native coronary artery without angina pectoris: Secondary | ICD-10-CM

## 2014-01-19 NOTE — Assessment & Plan Note (Signed)
I will repeat her echocardiogram to revaluate LV fx to assist with medical management. She may not need to be on so much lasix,as she is on lasix 40 mg BID currently. HR could be better controlled, but she is easily anxious and I will wait to increase BB once I have an idea what her LV fx is. She will see Dr. Purvis Sheffield in one month on a previously scheduled appt on Aug 12.

## 2014-01-19 NOTE — Assessment & Plan Note (Signed)
BP is low normal She is on isosorbide dinatrate,  10 mg BID, and Cozaar 25 mg daily, along with the metoprolol. I rechecked it in the exam room, manually. BP 110/68. Will adjust medications again on follow up should this be necessary. Will await echo results.

## 2014-01-19 NOTE — Assessment & Plan Note (Signed)
She has been taken off of Forteo as this was causing side effects of chest pain and shortness of breath. She is feeling much better concerning this with discontinuation of NSAID.

## 2014-01-19 NOTE — Assessment & Plan Note (Signed)
Heart rate is better than ER visit 2 days ago with adjustment of metoprolol to 50 mg daily, and stopping 25 mg pm dose. I have rechecked her HR apical, and found it to  93 bpm. She is a little anxious but states that she feels so much better with lower dose of metoprolol.

## 2014-01-19 NOTE — Progress Notes (Signed)
HPI: [name] is a 80 y/o patient of Dr. Purvis [name] we follow for ongoing assessment and treatment of Atrial fib, Systolic dysfunction, EF of 20%-25% we recently saw her in the ER on Friday, July 11th, with complaints of shortness breath, hypotension, and bradycardia. Medications were adjusted to decrease mertoprolol to once daily, add isosorbide mono 10 mg BID, and lasix was decreased. She was taken off of Forteo due to symptoms as well.   She comes today feeling much better. No further chest pain. HR in the 90's, atrial fib. BP is slightly low. She denies recurrent chest pain or shortness of breath. She still has some complaints of non-pitting LEE in the dependent position. Her main complaint is LUQ pain with residual soreness from fall in which she sustained a small hematoma. She is also taking a lot of laxatives and stool softners as she feels she should have a BM everyday.      Allergies  Allergen Reactions  . Codeine Other (See Comments)    Makes patient sleepy.  Marland Kitchen Penicillins Rash    Current Outpatient Prescriptions  Medication Sig Dispense Refill  . atorvastatin (LIPITOR) 40 MG tablet TAKE ONE TABLET BY MOUTH ONCE DAILY.  30 tablet  3  . Cholecalciferol (VITAMIN D) 2000 UNITS CAPS Take 2,000 Units by mouth daily.      . digoxin (LANOXIN) 0.125 MG tablet Take 1 tablet (0.125 mg total) by mouth daily.  30 tablet  12  . febuxostat (ULORIC) 40 MG tablet Take 40 mg by mouth daily.      . furosemide (LASIX) 40 MG tablet Take 1 tablet (40 mg total) by mouth 2 (two) times daily.  30 tablet  6  . gabapentin (NEURONTIN) 300 MG capsule Take 300 mg by mouth at bedtime.      . hydroxychloroquine (PLAQUENIL) 200 MG tablet Take 1 tablet by mouth Twice daily.      . Hypromellose (GENTEAL OP) Place 1 drop into both eyes daily as needed. Tired Eyes      . isosorbide dinitrate (ISORDIL) 5 MG tablet Take 10 mg by mouth 2 (two) times daily.       Marland Kitchen losartan (COZAAR) 50 MG tablet 1/2 tablet        . metoprolol succinate (TOPROL-XL) 50 MG 24 hr tablet Take 50 mg by mouth daily. Take  1 1/2 tabs daily (75 mg)      . nitroGLYCERIN (NITROSTAT) 0.4 MG SL tablet Place 1 tablet (0.4 mg total) under the tongue every 5 (five) minutes as needed for chest pain.  25 tablet  12  . omeprazole (PRILOSEC) 20 MG capsule Take 20 mg by mouth daily.      . potassium chloride (K-DUR,KLOR-CON) 10 MEQ tablet Take 10 mEq by mouth 2 (two) times daily.       . predniSONE (DELTASONE) 1 MG tablet Take 2 mg by mouth daily with breakfast.       . raloxifene (EVISTA) 60 MG tablet Take 1 tablet by mouth daily.      . Teriparatide, Recombinant, (FORTEO) 600 MCG/2.4ML SOLN Inject 20 mcg into the skin daily.      . traMADol (ULTRAM) 50 MG tablet Take 1 tablet (50 mg total) by mouth every 6 (six) hours as needed for moderate pain.  30 tablet  5  . warfarin (COUMADIN) 5 MG tablet Take 2.5-5 mg by mouth daily. Take 1 tablet daily except 1/2 tablet on Mondays and Thursdays       No  current facility-administered medications for this visit.    Past Medical History  Diagnosis Date  . Permanent atrial fibrillation     A.  Chronic Coumadin  . MVP (mitral valve prolapse)   . Essential hypertension, benign   . Nonischemic cardiomyopathy 2001    A.  07/23/11 - Echo: EF 35%;  B. 07/24/2011 - Cath: nonobs dzs ef 35%, 2+MR C. Echo 01/06/13: EF 40-45%, mild LVH, mlid MR, mod biatrial enlargment, mild RV dilatation, mod TR, PASP 54 mmHg  . Systolic CHF, chronic     A. Diag 07/2011, EF 35%  . Lumbar disc disease     Laminectomy 2001  . History of endometrial cancer   . Hyperlipidemia   . Rheumatoid arthritis     Treated in past with Remicade and developed vasculitis  . Gout   . PONV (postoperative nausea and vomiting)   . NSTEMI (non-ST elevated myocardial infarction) 12/2012  . GERD (gastroesophageal reflux disease)   . Coronary artery dissection 12/2012    95% OM3 hazy lesion on cath, medically managed  . Lumbar compression  fracture 2014    L3, L4    Past Surgical History  Procedure Laterality Date  . Appendectomy    . Abdominal hysterectomy  2000    Stage 1 endometrial cancer  . Hernia repair  09/2008  . Hernia repair  04/17/11    Lap ventral incisional hernia repair with mesh   . Gastric biopsy      Benign  . Excision of lipoma    . Lumbar laminectomy  2001  . Hammer toe surgery    . Cardiac catheterization  01/08/13    OM3 95% lesion felt to represent coronary dissection, mild luminal irregularities elsewhere; EF 40-45%; medically managed    ROS: Review of systems complete and found to be negative unless listed above  PHYSICAL EXAM BP 108/68  Pulse 55  Ht 5\' 5"  (1.651 m)  Wt 175 lb (79.379 kg)  BMI 29.12 kg/m2  SpO2 95% General: Well developed, well nourished, in no acute distress Head: Eyes PERRLA, No xanthomas.   Normal cephalic and atramatic  Lungs: Clear bilaterally to auscultation and percussion. Heart: HRI S1 S2, without MRG.  Pulses are 2+ & equal.            No carotid bruit. No JVD.  No abdominal bruits. No femoral bruits. Abdomen: Bowel sounds are positive, abdomen soft and non-tender without masses or                  Hernia's noted. Tenderness in the LUQ, no severe pain.  Msk:  Back normal, normal gait. Normal strength and tone for age. Extremities: No clubbing, cyanosis, non-pitting edema  Neuro: Alert and oriented X 3. Psych:  Good affect, responds appropriately   fib, low voltage, Incomplete RBBB. RAte of 101.   ASSESSMENT AND PLAN

## 2014-01-19 NOTE — Progress Notes (Deleted)
Name: Marissa [name]    DOB: 08/19/1932  Age: 80 y.o.  MR#: [id number]       PCP:  Carylon Perches, MD      Insurance: Payor: MEDICARE / Plan: MEDICARE PART A AND B / Product Type: *No Product type* /   CC:   No chief complaint on file.   VS Filed Vitals:   01/19/14 1501  BP: 108/68  Pulse: 55  Height: 5\' 5"  (1.651 m)  Weight: 175 lb (79.379 kg)  SpO2: 95%    Weights Current Weight  01/19/14 175 lb (79.379 kg)  01/16/14 175 lb (79.379 kg)  01/12/14 174 lb (78.926 kg)    Blood Pressure  BP Readings from Last 3 Encounters:  01/19/14 108/68  01/16/14 104/60  01/12/14 114/87     Admit date:  (Not on file) Last encounter with RMR:  10/20/2013   Allergy Codeine and Penicillins  Current Outpatient Prescriptions  Medication Sig Dispense Refill  . atorvastatin (LIPITOR) 40 MG tablet TAKE ONE TABLET BY MOUTH ONCE DAILY.  30 tablet  3  . Cholecalciferol (VITAMIN D) 2000 UNITS CAPS Take 2,000 Units by mouth daily.      . digoxin (LANOXIN) 0.125 MG tablet Take 1 tablet (0.125 mg total) by mouth daily.  30 tablet  12  . febuxostat (ULORIC) 40 MG tablet Take 40 mg by mouth daily.      . furosemide (LASIX) 40 MG tablet Take 1 tablet (40 mg total) by mouth 2 (two) times daily.  30 tablet  6  . gabapentin (NEURONTIN) 300 MG capsule Take 300 mg by mouth at bedtime.      . hydroxychloroquine (PLAQUENIL) 200 MG tablet Take 1 tablet by mouth Twice daily.      . Hypromellose (GENTEAL OP) Place 1 drop into both eyes daily as needed. Tired Eyes      . isosorbide dinitrate (ISORDIL) 5 MG tablet Take 10 mg by mouth 2 (two) times daily.       Marland Kitchen losartan (COZAAR) 50 MG tablet 1/2 tablet      . metoprolol succinate (TOPROL-XL) 50 MG 24 hr tablet Take 50 mg by mouth daily. Take  1 1/2 tabs daily (75 mg)      . nitroGLYCERIN (NITROSTAT) 0.4 MG SL tablet Place 1 tablet (0.4 mg total) under the tongue every 5 (five) minutes as needed for chest pain.  25 tablet  12  . omeprazole (PRILOSEC) 20 MG capsule Take  20 mg by mouth daily.      . potassium chloride (K-DUR,KLOR-CON) 10 MEQ tablet Take 10 mEq by mouth 2 (two) times daily.       . predniSONE (DELTASONE) 1 MG tablet Take 2 mg by mouth daily with breakfast.       . raloxifene (EVISTA) 60 MG tablet Take 1 tablet by mouth daily.      . Teriparatide, Recombinant, (FORTEO) 600 MCG/2.4ML SOLN Inject 20 mcg into the skin daily.      . traMADol (ULTRAM) 50 MG tablet Take 1 tablet (50 mg total) by mouth every 6 (six) hours as needed for moderate pain.  30 tablet  5  . warfarin (COUMADIN) 5 MG tablet Take 2.5-5 mg by mouth daily. Take 1 tablet daily except 1/2 tablet on Mondays and Thursdays       No current facility-administered medications for this visit.    Discontinued Meds:    Medications Discontinued During This Encounter  Medication Reason  . metoprolol succinate (TOPROL-XL) 50 MG 24 hr  tablet   . losartan (COZAAR) 50 MG tablet     Patient Active Problem List   Diagnosis Date Noted  . Acute exacerbation of CHF (congestive heart failure) 08/21/2013  . Encounter for therapeutic drug monitoring 08/07/2013  . Tachy-brady syndrome 01/16/2013  . Acute on chronic kidney disease, stage 3 01/16/2013  . Coronary artery dissection 01/16/2013  . Sustained VT (ventricular tachycardia) 01/07/2013  . Non-ST elevation myocardial infarction (NSTEMI), initial care episode 01/06/2013  . Acute on chronic systolic congestive heart failure 01/05/2013  . History of endometrial cancer   . Lumbar disc disease   . Hypertension   . Hyperlipidemia   . Rheumatoid arthritis   . Gout   . Cellulitis of right leg   . Nonischemic cardiomyopathy 08/09/2011  . Chronic systolic heart failure 08/09/2011  . Chronic anticoagulation 10/07/2010  . Chronic atrial fibrillation     LABS    Component Value Date/Time   NA 146 01/16/2014 0953   NA 151* 01/12/2014 1108   NA 146 12/14/2013 0852   K 4.0 01/16/2014 0953   K 4.1 01/12/2014 1108   K 3.8 12/14/2013 0852   CL 108  01/16/2014 0953   CL 111 01/12/2014 1108   CL 104 12/14/2013 0852   CO2 24 01/16/2014 0953   CO2 29 01/12/2014 1108   CO2 31 12/14/2013 0852   GLUCOSE 145* 01/16/2014 0953   GLUCOSE 88 01/12/2014 1108   GLUCOSE 89 12/14/2013 0852   BUN 41* 01/16/2014 0953   BUN 33* 01/12/2014 1108   BUN 33* 12/14/2013 0852   CREATININE 2.16* 01/16/2014 0953   CREATININE 1.59* 01/12/2014 1108   CREATININE 1.65* 12/14/2013 0852   CREATININE 1.39* 05/22/2013 1444   CREATININE 1.36* 05/09/2013 0815   CREATININE 1.04 11/09/2010 1227   CALCIUM 9.6 01/16/2014 0953   CALCIUM 9.9 01/12/2014 1108   CALCIUM 9.9 12/14/2013 0852   GFRNONAA 21* 01/16/2014 0953   GFRNONAA 30* 01/12/2014 1108   GFRNONAA 29* 12/14/2013 0852   GFRAA 24* 01/16/2014 0953   GFRAA 35* 01/12/2014 1108   GFRAA 33* 12/14/2013 0852   CMP     Component Value Date/Time   NA 146 01/16/2014 0953   K 4.0 01/16/2014 0953   CL 108 01/16/2014 0953   CO2 24 01/16/2014 0953   GLUCOSE 145* 01/16/2014 0953   BUN 41* 01/16/2014 0953   CREATININE 2.16* 01/16/2014 0953   CREATININE 1.39* 05/22/2013 1444   CALCIUM 9.6 01/16/2014 0953   PROT 7.1 01/06/2013 0028   ALBUMIN 3.5 01/06/2013 0028   AST 69* 01/06/2013 0028   ALT 24 01/06/2013 0028   ALKPHOS 53 01/06/2013 0028   BILITOT 0.4 01/06/2013 0028   GFRNONAA 21* 01/16/2014 0953   GFRAA 24* 01/16/2014 0953       Component Value Date/Time   WBC 6.4 01/16/2014 0953   WBC 5.7 01/12/2014 1108   WBC 9.2 12/14/2013 0852   HGB 11.7* 01/16/2014 0953   HGB 11.9* 01/12/2014 1108   HGB 11.2* 12/14/2013 0852   HCT 36.4 01/16/2014 0953   HCT 37.8 01/12/2014 1108   HCT 35.7* 12/14/2013 0852   MCV 91.9 01/16/2014 0953   MCV 92.6 01/12/2014 1108   MCV 92.7 12/14/2013 0852    Lipid Panel     Component Value Date/Time   CHOL 129 08/22/2013 0441   TRIG 60 08/22/2013 0441   HDL 59 08/22/2013 0441   CHOLHDL 2.2 08/22/2013 0441   VLDL 12 08/22/2013 0441   LDLCALC 58 08/22/2013 0441  ABG No results found for this basename: phart, pco2, pco2art, po2, po2art, hco3, tco2,  acidbasedef, o2sat     Lab Results  Component Value Date   TSH 0.829 08/21/2013   BNP (last 3 results)  Recent Labs  08/21/13 1220  PROBNP 8457.0*   Cardiac Panel (last 3 results) No results found for this basename: CKTOTAL, CKMB, TROPONINI, RELINDX,  in the last 72 hours  Iron/TIBC/Ferritin/ %Sat No results found for this basename: iron, tibc, ferritin, ironpctsat     EKG Orders placed during the hospital encounter of 01/16/14  . ED EKG  . ED EKG  . EKG 12-LEAD  . EKG 12-LEAD  . EKG     Prior Assessment and Plan Problem List as of 01/19/2014     Cardiovascular and Mediastinum   Chronic atrial fibrillation   Last Assessment & Plan   10/20/2013 Office Visit Edited 10/20/2013  4:48 PM by Jodelle Gross, NP     She  Is rate controlled currently and NSR after DCCV while recently hospitalized. Can consider repeat  cardiac monitor if she again has symptoms of rapid HR. Cardiac monitor done in Feb of 2015 demonstrated atrial fib with labile HR, before medication changes Continue dig and metoprolol    Nonischemic cardiomyopathy   Last Assessment & Plan   10/20/2013 Office Visit Written 10/20/2013  4:41 PM by Jodelle Gross, NP     She appears well compensated for now. She still eats some salty foods, and daughter is frustrated with her about this. She wants her to downsize and move to assisted living where meals are prepared for her. The patient does not feel she is ready for this.   I have counseled her on low sodium diet and daily wts. She requests a shower chair and raised toilet seat device to assist with her overall deconditioning with decreased heart function. I have written Rx for this.  She is to weigh daily and plans to do so, with assistance of HHN who visits a couple of times a week. She is to document her wt and call us for wt gain. I have spent 30 minutes with this patient and her daughter educating her on diet and her current health status. We will have an echo  completed in 3 months.    Chronic systolic heart failure   Last Assessment & Plan   08/07/2013 Office Visit Written 08/07/2013  5:49 PM by Jodelle Gross, NP     The patient has no evidence of fluid overload. She is not taking her Lasix in the afternoon. We will go ahead and discontinue that. She will remain on Lasix 40 mg daily. Hold a BMET drawn to evaluate her kidney function, which can be drawn per home health evaluation through [facility].     Hypertension   Last Assessment & Plan   10/20/2013 Office Visit Written 10/20/2013  4:45 PM by Jodelle Gross, NP     BP is well controlled for her current cardiomyopathy with systolic dysfunction. She will be continued on her digoxin, lasix and  losartan for now. She is unable to tolerate coreg but is on metoprolol and tolerating this well.  She will be seen again in 3-4 months. Will not plan NM study until we see her on follow up if EF is not improved.    Acute on chronic systolic congestive heart failure   Last Assessment & Plan   10/20/2013 Office Visit Written 10/20/2013  4:49 PM by Jodelle Gross, NP  No evidence of fluid overload at this time. Continue current medication regimen.    Non-ST elevation myocardial infarction (NSTEMI), initial care episode   Sustained VT (ventricular tachycardia)   Tachy-brady syndrome   Coronary artery dissection   Last Assessment & Plan   01/23/2013 Office Visit Written 01/23/2013  4:44 PM by Jodelle Gross, NP     She is doing well. She has not had to take nitroglycerin, she has had no recurrent chest pain, I have warned her that she is trying to do more than she needs at this time. She wants to go back to her usual activities but often becomes tired. I have advised her to take her time and to increase her activity slowly. She has not returned to work until after being seen again in one month for ongoing assessment. He works as a [employer] officer, and has been told that she may take as much time as she  needs. She will followup with Dr. Beulah Gandy to be est. with him.     Acute exacerbation of CHF (congestive heart failure)     Musculoskeletal and Integument   Lumbar disc disease   Rheumatoid arthritis     Genitourinary   Acute on chronic kidney disease, stage 3     Other   Chronic anticoagulation   Last Assessment & Plan   08/09/2011 Office Visit Written 08/09/2011  3:36 PM by Gaylord Shih, MD     Fall risk assessment done and preventative strategies reviewed. Patient advised if she hits her head she must get medical attention immediately.    History of endometrial cancer   Hyperlipidemia   Gout   Cellulitis of right leg   Encounter for therapeutic drug monitoring       Imaging: Dg Chest 2 View  01/16/2014   CLINICAL DATA:  Chest pain.  Shortness of breath.  EXAM: CHEST  2 VIEW  COMPARISON:  Two-view chest 01/12/2014  FINDINGS: The heart is enlarged. There is no edema or effusion to suggest failure. Minimal atelectasis at the right base is noted. Eventration of the right hemidiaphragm is stable. Atherosclerotic calcifications are again noted within the aorta. The visualized soft tissues and bony thorax are otherwise unremarkable.  IMPRESSION: 1. Stable cardiomegaly without failure. 2. Stable right lower lobe atelectasis. 3. No acute cardiopulmonary disease.   Electronically Signed   By: Gennette Pac M.D.   On: 01/16/2014 11:04   Dg Chest 2 View  01/12/2014   CLINICAL DATA:  Shortness of breath and chest pain.  EXAM: CHEST  2 VIEW  COMPARISON:  Single view of the chest 12/15/1998 is seen. PA and lateral chest 08/03/2012.  FINDINGS: Elevation of the right hemidiaphragm is again seen. There is cardiomegaly without edema. Lungs are clear. No pneumothorax or pleural effusion. No focal bony abnormality.  IMPRESSION: Cardiomegaly without acute disease.  Stable compared to prior exam.   Electronically Signed   By: Drusilla Kanner M.D.   On: 01/12/2014 11:48

## 2014-01-19 NOTE — Assessment & Plan Note (Signed)
She continues to have some pain in the LUQ, and her CNA on bathing has noticed that there is some firmness and tenderness when she bathes her, in the LUQ.  On examination, I do not feel any firmness, but she remains tender. She has very active bowel sounds.   I will get abdominal films to reassess her status. She is advised not to take a laxative, stool softener, eat prunes and take miralax all at the same time.  I have suggested that she take the miralax only and only use the others when she has not had a BM in over 4 days. She is not eating a lot,but has maintained her wt.

## 2014-01-19 NOTE — Patient Instructions (Signed)
Your physician recommends that you schedule a follow-up appointment in: Keep your appointment  August 12th with Dr Purvis Sheffield.  Your physician has requested that you have an echocardiogram. Echocardiography is a painless test that uses sound waves to create images of your heart. It provides your doctor with information about the size and shape of your heart and how well your heart's chambers and valves are working. This procedure takes approximately one hour. There are no restrictions for this procedure.  Marland KitchenUltrsound Abdomen complete- Nothing to eat or drink 8 hours prior to ultrasound.

## 2014-01-20 ENCOUNTER — Emergency Department (HOSPITAL_COMMUNITY): Payer: Medicare Other

## 2014-01-20 ENCOUNTER — Inpatient Hospital Stay (HOSPITAL_COMMUNITY)
Admission: EM | Admit: 2014-01-20 | Discharge: 2014-01-29 | DRG: 242 | Disposition: A | Discharge status: Skilled Nursing Facility | Payer: Medicare Other | Attending: Internal Medicine | Admitting: Internal Medicine

## 2014-01-20 ENCOUNTER — Encounter (HOSPITAL_COMMUNITY): Payer: Self-pay | Admitting: Emergency Medicine

## 2014-01-20 DIAGNOSIS — K219 Gastro-esophageal reflux disease without esophagitis: Secondary | ICD-10-CM | POA: Diagnosis present

## 2014-01-20 DIAGNOSIS — N179 Acute kidney failure, unspecified: Secondary | ICD-10-CM | POA: Diagnosis present

## 2014-01-20 DIAGNOSIS — R001 Bradycardia, unspecified: Secondary | ICD-10-CM

## 2014-01-20 DIAGNOSIS — I4891 Unspecified atrial fibrillation: Secondary | ICD-10-CM | POA: Diagnosis present

## 2014-01-20 DIAGNOSIS — I2510 Atherosclerotic heart disease of native coronary artery without angina pectoris: Secondary | ICD-10-CM | POA: Diagnosis present

## 2014-01-20 DIAGNOSIS — I252 Old myocardial infarction: Secondary | ICD-10-CM

## 2014-01-20 DIAGNOSIS — N390 Urinary tract infection, site not specified: Secondary | ICD-10-CM | POA: Diagnosis present

## 2014-01-20 DIAGNOSIS — J9600 Acute respiratory failure, unspecified whether with hypoxia or hypercapnia: Secondary | ICD-10-CM | POA: Diagnosis present

## 2014-01-20 DIAGNOSIS — I428 Other cardiomyopathies: Secondary | ICD-10-CM

## 2014-01-20 DIAGNOSIS — IMO0002 Reserved for concepts with insufficient information to code with codable children: Secondary | ICD-10-CM | POA: Diagnosis not present

## 2014-01-20 DIAGNOSIS — I129 Hypertensive chronic kidney disease with stage 1 through stage 4 chronic kidney disease, or unspecified chronic kidney disease: Secondary | ICD-10-CM | POA: Diagnosis present

## 2014-01-20 DIAGNOSIS — Z8542 Personal history of malignant neoplasm of other parts of uterus: Secondary | ICD-10-CM

## 2014-01-20 DIAGNOSIS — A498 Other bacterial infections of unspecified site: Secondary | ICD-10-CM | POA: Diagnosis present

## 2014-01-20 DIAGNOSIS — I5043 Acute on chronic combined systolic (congestive) and diastolic (congestive) heart failure: Principal | ICD-10-CM

## 2014-01-20 DIAGNOSIS — N183 Chronic kidney disease, stage 3 unspecified: Secondary | ICD-10-CM | POA: Diagnosis present

## 2014-01-20 DIAGNOSIS — I5033 Acute on chronic diastolic (congestive) heart failure: Secondary | ICD-10-CM

## 2014-01-20 DIAGNOSIS — M810 Age-related osteoporosis without current pathological fracture: Secondary | ICD-10-CM | POA: Diagnosis present

## 2014-01-20 DIAGNOSIS — I509 Heart failure, unspecified: Secondary | ICD-10-CM | POA: Diagnosis present

## 2014-01-20 DIAGNOSIS — E785 Hyperlipidemia, unspecified: Secondary | ICD-10-CM | POA: Diagnosis present

## 2014-01-20 DIAGNOSIS — Z885 Allergy status to narcotic agent status: Secondary | ICD-10-CM

## 2014-01-20 DIAGNOSIS — Z880 Allergy status to penicillin: Secondary | ICD-10-CM

## 2014-01-20 DIAGNOSIS — R0602 Shortness of breath: Secondary | ICD-10-CM | POA: Diagnosis present

## 2014-01-20 DIAGNOSIS — I495 Sick sinus syndrome: Secondary | ICD-10-CM | POA: Diagnosis present

## 2014-01-20 DIAGNOSIS — M069 Rheumatoid arthritis, unspecified: Secondary | ICD-10-CM | POA: Diagnosis present

## 2014-01-20 DIAGNOSIS — I5023 Acute on chronic systolic (congestive) heart failure: Secondary | ICD-10-CM

## 2014-01-20 DIAGNOSIS — I482 Chronic atrial fibrillation, unspecified: Secondary | ICD-10-CM

## 2014-01-20 DIAGNOSIS — I059 Rheumatic mitral valve disease, unspecified: Secondary | ICD-10-CM | POA: Diagnosis present

## 2014-01-20 DIAGNOSIS — I4819 Other persistent atrial fibrillation: Secondary | ICD-10-CM

## 2014-01-20 DIAGNOSIS — I130 Hypertensive heart and chronic kidney disease with heart failure and stage 1 through stage 4 chronic kidney disease, or unspecified chronic kidney disease: Secondary | ICD-10-CM

## 2014-01-20 DIAGNOSIS — Z7901 Long term (current) use of anticoagulants: Secondary | ICD-10-CM

## 2014-01-20 DIAGNOSIS — I480 Paroxysmal atrial fibrillation: Secondary | ICD-10-CM

## 2014-01-20 DIAGNOSIS — I10 Essential (primary) hypertension: Secondary | ICD-10-CM

## 2014-01-20 HISTORY — DX: Bradycardia, unspecified: R00.1

## 2014-01-20 LAB — URINE MICROSCOPIC-ADD ON

## 2014-01-20 LAB — COMPREHENSIVE METABOLIC PANEL
ALK PHOS: 93 U/L (ref 39–117)
ALT: 19 U/L (ref 0–35)
ANION GAP: 13 (ref 5–15)
AST: 25 U/L (ref 0–37)
Albumin: 3.6 g/dL (ref 3.5–5.2)
BUN: 40 mg/dL — AB (ref 6–23)
CO2: 28 mEq/L (ref 19–32)
Calcium: 10.3 mg/dL (ref 8.4–10.5)
Chloride: 105 mEq/L (ref 96–112)
Creatinine, Ser: 1.88 mg/dL — ABNORMAL HIGH (ref 0.50–1.10)
GFR, EST AFRICAN AMERICAN: 28 mL/min — AB (ref >90)
GFR, EST NON AFRICAN AMERICAN: 24 mL/min — AB (ref >90)
GLUCOSE: 121 mg/dL — AB (ref 70–99)
POTASSIUM: 4.1 meq/L (ref 3.7–5.3)
Sodium: 146 mEq/L (ref 137–147)
TOTAL PROTEIN: 7 g/dL (ref 6.0–8.3)
Total Bilirubin: 0.5 mg/dL (ref 0.3–1.2)

## 2014-01-20 LAB — CBC WITH DIFFERENTIAL/PLATELET
Basophils Absolute: 0 10*3/uL (ref 0.0–0.1)
Basophils Relative: 1 % (ref 0–1)
EOS PCT: 2 % (ref 0–5)
Eosinophils Absolute: 0.2 10*3/uL (ref 0.0–0.7)
HCT: 40.1 % (ref 36.0–46.0)
HEMOGLOBIN: 12.7 g/dL (ref 12.0–15.0)
LYMPHS ABS: 1.5 10*3/uL (ref 0.7–4.0)
Lymphocytes Relative: 18 % (ref 12–46)
MCH: 29.1 pg (ref 26.0–34.0)
MCHC: 31.7 g/dL (ref 30.0–36.0)
MCV: 92 fL (ref 78.0–100.0)
MONOS PCT: 6 % (ref 3–12)
Monocytes Absolute: 0.5 10*3/uL (ref 0.1–1.0)
NEUTROS PCT: 73 % (ref 43–77)
Neutro Abs: 6.2 10*3/uL (ref 1.7–7.7)
Platelets: 212 10*3/uL (ref 150–400)
RBC: 4.36 MIL/uL (ref 3.87–5.11)
RDW: 15.5 % (ref 11.5–15.5)
WBC: 8.4 10*3/uL (ref 4.0–10.5)

## 2014-01-20 LAB — URINALYSIS, ROUTINE W REFLEX MICROSCOPIC
Bilirubin Urine: NEGATIVE
GLUCOSE, UA: NEGATIVE mg/dL
HGB URINE DIPSTICK: NEGATIVE
KETONES UR: NEGATIVE mg/dL
Nitrite: NEGATIVE
PROTEIN: NEGATIVE mg/dL
Specific Gravity, Urine: 1.02 (ref 1.005–1.030)
UROBILINOGEN UA: 0.2 mg/dL (ref 0.0–1.0)
pH: 5.5 (ref 5.0–8.0)

## 2014-01-20 LAB — PRO B NATRIURETIC PEPTIDE: Pro B Natriuretic peptide (BNP): 24961 pg/mL — ABNORMAL HIGH (ref 0–450)

## 2014-01-20 LAB — TROPONIN I: Troponin I: <0.3 ng/mL (ref <0.30)

## 2014-01-20 LAB — DIGOXIN LEVEL: Digoxin Level: <0.3 ng/mL — ABNORMAL LOW (ref 0.8–2.0)

## 2014-01-20 LAB — PROTIME-INR
INR: 3.32 — ABNORMAL HIGH (ref 0.00–1.49)
PROTHROMBIN TIME: 33.7 s — AB (ref 11.6–15.2)

## 2014-01-20 MED ORDER — WARFARIN SODIUM 2.5 MG PO TABS: 2.5000 mg | ORAL_TABLET | Freq: Every day | ORAL | Status: DC

## 2014-01-20 MED ORDER — SODIUM CHLORIDE 0.9 % IV SOLN
250.0000 mL | INTRAVENOUS | Status: DC | PRN
  Administered 2014-01-24: 250 mL via INTRAVENOUS

## 2014-01-20 MED ORDER — HYDROXYCHLOROQUINE SULFATE 200 MG PO TABS
ORAL_TABLET | ORAL | Status: AC
  Filled 2014-01-20: qty 1

## 2014-01-20 MED ORDER — FUROSEMIDE 10 MG/ML IJ SOLN
40.0000 mg | Freq: Once | INTRAMUSCULAR | Status: AC
  Administered 2014-01-20: 40 mg via INTRAVENOUS
  Filled 2014-01-20: qty 4

## 2014-01-20 MED ORDER — SODIUM CHLORIDE 0.9 % IJ SOLN: 3.0000 mL | INTRAMUSCULAR | Status: DC | PRN

## 2014-01-20 MED ORDER — SODIUM CHLORIDE 0.9 % IJ SOLN
3.0000 mL | Freq: Two times a day (BID) | INTRAMUSCULAR | Status: DC
  Administered 2014-01-21 – 2014-01-29 (×15): 3 mL via INTRAVENOUS

## 2014-01-20 MED ORDER — ATORVASTATIN CALCIUM 40 MG PO TABS
40.0000 mg | ORAL_TABLET | Freq: Every day | ORAL | Status: DC
  Administered 2014-01-20 – 2014-01-28 (×9): 40 mg via ORAL
  Filled 2014-01-20 (×10): qty 1

## 2014-01-20 MED ORDER — ONDANSETRON HCL 4 MG PO TABS: 4.0000 mg | ORAL_TABLET | Freq: Four times a day (QID) | ORAL | Status: DC | PRN

## 2014-01-20 MED ORDER — SODIUM CHLORIDE 0.9 % IJ SOLN
3.0000 mL | Freq: Two times a day (BID) | INTRAMUSCULAR | Status: DC
  Administered 2014-01-20 – 2014-01-29 (×13): 3 mL via INTRAVENOUS

## 2014-01-20 MED ORDER — ONDANSETRON HCL 4 MG/2ML IJ SOLN: 4.0000 mg | Freq: Four times a day (QID) | INTRAMUSCULAR | Status: DC | PRN

## 2014-01-20 MED ORDER — HYDROXYCHLOROQUINE SULFATE 200 MG PO TABS
200.0000 mg | ORAL_TABLET | Freq: Two times a day (BID) | ORAL | Status: DC
  Administered 2014-01-21 – 2014-01-29 (×17): 200 mg via ORAL
  Filled 2014-01-20 (×22): qty 1

## 2014-01-20 MED ORDER — PANTOPRAZOLE SODIUM 40 MG PO TBEC
40.0000 mg | DELAYED_RELEASE_TABLET | Freq: Every day | ORAL | Status: DC
Start: 2014-01-20 — End: 2014-01-29
  Administered 2014-01-20 – 2014-01-29 (×10): 40 mg via ORAL
  Filled 2014-01-20 (×10): qty 1

## 2014-01-20 MED ORDER — PREDNISONE 1 MG PO TABS
2.0000 mg | ORAL_TABLET | Freq: Every day | ORAL | Status: DC
  Administered 2014-01-21 – 2014-01-29 (×9): 2 mg via ORAL
  Filled 2014-01-20 (×11): qty 2

## 2014-01-20 MED ORDER — FUROSEMIDE 10 MG/ML IJ SOLN
40.0000 mg | Freq: Two times a day (BID) | INTRAMUSCULAR | Status: DC
  Administered 2014-01-21: 40 mg via INTRAVENOUS
  Filled 2014-01-20: qty 4

## 2014-01-20 MED ORDER — TRAMADOL HCL 50 MG PO TABS
50.0000 mg | ORAL_TABLET | Freq: Four times a day (QID) | ORAL | Status: DC | PRN
  Administered 2014-01-20 – 2014-01-29 (×12): 50 mg via ORAL
  Filled 2014-01-20 (×13): qty 1

## 2014-01-20 MED ORDER — GABAPENTIN 300 MG PO CAPS
300.0000 mg | ORAL_CAPSULE | Freq: Every day | ORAL | Status: DC
  Administered 2014-01-20 – 2014-01-28 (×9): 300 mg via ORAL
  Filled 2014-01-20 (×10): qty 1

## 2014-01-20 MED ORDER — POTASSIUM CHLORIDE CRYS ER 10 MEQ PO TBCR
10.0000 meq | EXTENDED_RELEASE_TABLET | Freq: Two times a day (BID) | ORAL | Status: DC
  Administered 2014-01-20 – 2014-01-29 (×18): 10 meq via ORAL
  Filled 2014-01-20 (×19): qty 1

## 2014-01-20 MED ORDER — DIGOXIN 125 MCG PO TABS: 0.1250 mg | ORAL_TABLET | Freq: Every day | ORAL | Status: DC

## 2014-01-20 MED ORDER — FEBUXOSTAT 40 MG PO TABS
40.0000 mg | ORAL_TABLET | Freq: Every day | ORAL | Status: DC
  Administered 2014-01-21 – 2014-01-29 (×9): 40 mg via ORAL
  Filled 2014-01-20 (×10): qty 1

## 2014-01-20 MED ORDER — NITROGLYCERIN 0.4 MG SL SUBL
0.4000 mg | SUBLINGUAL_TABLET | SUBLINGUAL | Status: DC | PRN
  Administered 2014-01-24 (×2): 0.4 mg via SUBLINGUAL
  Filled 2014-01-20: qty 1

## 2014-01-20 NOTE — ED Notes (Signed)
Pt c/o intermittent chest pain and SOB. Pt states symptoms have "come and gone" over past 1-2 weeks. Pt presents with bilateral leg swelling and states breathing becomes more difficult when lying flat. Pt reports hx of CHF, COPD, and MI. Pt has been seen for same symptoms two times in past week but denies relief. Pt is A&Ox4.

## 2014-01-20 NOTE — H&P (Signed)
[facility] History and Physical  Marissa [name] BEE:[id number] DOB: 25-Dec-1932 DOA: 01/20/2014  Referring physician: ED physician PCP: Carylon Perches, MD   Chief Complaint: shortness of breath   HPI:  Patient is 80 year old female with history of severe combined systolic and diastolic CHF, last known ejection fraction 20-25% per 2-D echocardiogram in February 2015, atrial fibrillation, on Coumadin and digoxin, hypertension, hyperlipidemia, presented to [facility] emergency department with main concern of several days duration of progressively worsening shortness of breath that was initially present with exertion and now present at rest. This is associated with intermittent episodes of mid area chest pain. She denies chest pain at this time. Patient denies fevers and chills, no abdominal or urinary concerns.  In emergency department patient noted to be hemodynamically stable, physical exam significant for crackles. Vital signs stable except heart rate in 40s to 50s. [facility] called to admit patient for further evaluation.   Assessment and Plan: Active Problems: Acute respiratory failure - Appears to be secondary to acute on chronic systolic and diastolic CHF - Patient is medically stable for telemetry bed - Placed on Lasix IV 40 mg twice a day - Provide oxygen as needed Severe systolic and diastolic acute on chronic CHF  - last known EF 20 - 25% based on 2 D ECHO February 2015  - provide Lasix - monitor daily weights, I's and O's - weight on admission 175 lbs  - close monitoring of renal function  Acute on chronic renal failure stage II - III - BMP in AM Atrial Fibrillation - continue Coumadin per pharmacy - continue digoxin  HTN - BP on admission soft - hold home medical regimen which includes Imdur, Losartan, Toprol, continue Lasix   Radiological Exams on Admission: Dg Chest 2 View   01/20/2014   No active cardiopulmonary disease.     Code Status: Full Family  Communication: Pt at bedside Disposition Plan: Admit for further evaluation    Review of Systems:  Constitutional: Negative for fever, chills and malaise/fatigue. Negative for diaphoresis.  HENT: Negative for hearing loss, ear pain, sore throat, neck pain, tinnitus and ear discharge.   Eyes: Negative for blurred vision, double vision, photophobia, pain, discharge and redness.  Respiratory: Negative for cough, hemoptysis, wheezing and stridor.   Cardiovascular: Negative for chest pain, palpitations, orthopnea, claudication and leg swelling.  Gastrointestinal:  Negative for heartburn, constipation, blood in stool and melena.  Genitourinary: Negative for dysuria, urgency, frequency, hematuria and flank pain.  Musculoskeletal: Negative for myalgias, back pain, joint pain and falls.  Skin: Negative for itching and rash.  Neurological: Negative for dizziness, speech change, focal weakness, loss of consciousness and headaches.  Endo/Heme/Allergies: Negative for environmental allergies and polydipsia. Does not bruise/bleed easily.  Psychiatric/Behavioral: Negative for suicidal ideas. The patient is not nervous/anxious.      Past Medical History  Diagnosis Date  . Permanent atrial fibrillation     A.  Chronic Coumadin  . MVP (mitral valve prolapse)   . Essential hypertension, benign   . Nonischemic cardiomyopathy 2001    A.  07/23/11 - Echo: EF 35%;  B. 07/24/2011 - Cath: nonobs dzs ef 35%, 2+MR C. Echo 01/06/13: EF 40-45%, mild LVH, mlid MR, mod biatrial enlargment, mild RV dilatation, mod TR, PASP 54 mmHg  . Systolic CHF, chronic     A. Diag 07/2011, EF 35%  . Lumbar disc disease     Laminectomy 2001  . History of endometrial cancer   . Hyperlipidemia   . Rheumatoid arthritis  Treated in past with Remicade and developed vasculitis  . Gout   . PONV (postoperative nausea and vomiting)   . NSTEMI (non-ST elevated myocardial infarction) 12/2012  . GERD (gastroesophageal reflux disease)   .  Coronary artery dissection 12/2012    95% OM3 hazy lesion on cath, medically managed  . Lumbar compression fracture 2014    L3, L4    Past Surgical History  Procedure Laterality Date  . Appendectomy    . Abdominal hysterectomy  2000    Stage 1 endometrial cancer  . Hernia repair  09/2008  . Hernia repair  04/17/11    Lap ventral incisional hernia repair with mesh   . Gastric biopsy      Benign  . Excision of lipoma    . Lumbar laminectomy  2001  . Hammer toe surgery    . Cardiac catheterization  01/08/13    OM3 95% lesion felt to represent coronary dissection, mild luminal irregularities elsewhere; EF 40-45%; medically managed    Social History:  reports that she has never smoked. She has never used smokeless tobacco. She reports that she drinks alcohol. She reports that she does not use illicit drugs.  Allergies  Allergen Reactions  . Codeine Other (See Comments)    Makes patient sleepy.  Marland Kitchen Penicillins Rash    Family History  Problem Relation Age of Onset  . Cancer Father     Prior to Admission medications   Medication Sig Start Date End Date Taking? Authorizing Provider  atorvastatin (LIPITOR) 40 MG tablet Take 40 mg by mouth daily.   Yes Historical Provider, MD  Cholecalciferol (VITAMIN D) 2000 UNITS CAPS Take 2,000 Units by mouth daily.   Yes Historical Provider, MD  digoxin (LANOXIN) 0.125 MG tablet Take 1 tablet (0.125 mg total) by mouth daily. 08/26/13  Yes Fredirick Maudlin, MD  febuxostat (ULORIC) 40 MG tablet Take 40 mg by mouth daily.   Yes Historical Provider, MD  furosemide (LASIX) 40 MG tablet Take 1 tablet (40 mg total) by mouth 2 (two) times daily. 11/19/13  Yes Jodelle Gross, NP  gabapentin (NEURONTIN) 300 MG capsule Take 300 mg by mouth at bedtime.   Yes Historical Provider, MD  hydroxychloroquine (PLAQUENIL) 200 MG tablet Take 1 tablet by mouth Twice daily. 07/19/12  Yes Historical Provider, MD  Hypromellose (GENTEAL OP) Place 1 drop into both eyes daily  as needed. Tired Eyes   Yes Historical Provider, MD  isosorbide dinitrate (ISORDIL) 5 MG tablet Take 10 mg by mouth 2 (two) times daily.  01/16/14  Yes Historical Provider, MD  losartan (COZAAR) 50 MG tablet Take 25 mg by mouth daily.   Yes Historical Provider, MD  metoprolol succinate (TOPROL-XL) 50 MG 24 hr tablet Take 50 mg by mouth daily.  01/13/14  Yes Jodelle Gross, NP  omeprazole (PRILOSEC) 20 MG capsule Take 20 mg by mouth daily.   Yes Historical Provider, MD  potassium chloride (K-DUR,KLOR-CON) 10 MEQ tablet Take 10 mEq by mouth 2 (two) times daily.  07/24/13  Yes Historical Provider, MD  predniSONE (DELTASONE) 1 MG tablet Take 2 mg by mouth daily with breakfast.  12/25/13  Yes Historical Provider, MD  warfarin (COUMADIN) 5 MG tablet Take 2.5-5 mg by mouth daily. Take 1 tablet daily except 1/2 tablet on Mondays and Thursdays 07/08/13  Yes Laqueta Linden, MD  nitroGLYCERIN (NITROSTAT) 0.4 MG SL tablet Place 1 tablet (0.4 mg total) under the tongue every 5 (five) minutes as needed for chest  pain. 01/16/13   Roger A Arguello, PA-C  traMADol (ULTRAM) 50 MG tablet Take 1 tablet (50 mg total) by mouth every 6 (six) hours as needed for moderate pain. 08/26/13   Fredirick Maudlin, MD    Physical Exam: Filed Vitals:   01/20/14 1554 01/20/14 1559 01/20/14 1801  BP:  94/75 104/58  Pulse:  55 51  Temp:  97.5 F (36.4 C)   TempSrc:  Oral   Resp:  20   Height: 5\' 5"  (1.651 m)    Weight: 79.379 kg (175 lb)    SpO2:  97% 93%    Physical Exam  Constitutional: Appears well-developed and well-nourished. No distress.  HENT: Normocephalic. External right and left ear normal. Oropharynx is clear and moist.  Eyes: Conjunctivae and EOM are normal. PERRLA, no scleral icterus.  Neck: Normal ROM. Neck supple. No JVD. No tracheal deviation. No thyromegaly.  CVS: RRR, S1/S2 +, no murmurs, no gallops, no carotid bruit.  Pulmonary: Effort and breath sounds normal, no stridor, rhonchi, wheezes, rales.   Abdominal: Soft. BS +,  no distension, tenderness, rebound or guarding.  Musculoskeletal: Normal range of motion.  Lymphadenopathy: No lymphadenopathy noted, cervical, inguinal. Neuro: Alert. Normal reflexes, muscle tone coordination. No cranial nerve deficit. Skin: Skin is warm and dry. No rash noted. Not diaphoretic. No erythema. No pallor.  Psychiatric: Normal mood and affect. Behavior, judgment, thought content normal.   Labs on Admission:  Basic Metabolic Panel:  Recent Labs Lab 01/16/14 0953 01/20/14 1615  NA 146 146  K 4.0 4.1  CL 108 105  CO2 24 28  GLUCOSE 145* 121*  BUN 41* 40*  CREATININE 2.16* 1.88*  CALCIUM 9.6 10.3   Liver Function Tests:  Recent Labs Lab 01/20/14 1615  AST 25  ALT 19  ALKPHOS 93  BILITOT 0.5  PROT 7.0  ALBUMIN 3.6   CBC:  Recent Labs Lab 01/16/14 0953 01/20/14 1615  WBC 6.4 8.4  NEUTROABS 4.2 6.2  HGB 11.7* 12.7  HCT 36.4 40.1  MCV 91.9 92.0  PLT 163 212   Cardiac Enzymes:  Recent Labs Lab 01/16/14 0953 01/20/14 1615  TROPONINI <0.30 <0.30    EKG: Normal sinus rhythm, no ST/T wave changes  01/22/14, MD  [facility] Pager [phone]  If 7PM-7AM, please contact night-coverage [email or web address] Password [facility] 01/20/2014, 7:32 PM

## 2014-01-20 NOTE — ED Provider Notes (Signed)
CSN: [id number]     Arrival date & time 01/20/14  1547 History  This chart was scribed for Gilda Crease, * by Modena Jansky, ED Scribe. This patient was seen in room APA15/[facility]15 and the patient's care was started at 4:08 PM.  Chief Complaint  Patient presents with  . Chest Pain   The history is provided by the patient and a relative. No language interpreter was used.   HPI Comments: Marissa [name] is a 80 y.o. female who presents to the Emergency Department complaining of an episode of intermittent moderate chest pain that started this morning. Marissa [name] states that this is the third episode of chest pain this week. Marissa [name] states that there is currently no pain but Marissa [name] rates the pain present earlier today as a 5/10 in severity. Marissa [name] describes the pain as a "tensing" pain. Marissa [name] reports associated SOB that started a week ago. Marissa [name] reports no modifying factors.   Past Medical History  Diagnosis Date  . Permanent atrial fibrillation     A.  Chronic Coumadin  . MVP (mitral valve prolapse)   . Essential hypertension, benign   . Nonischemic cardiomyopathy 2001    A.  07/23/11 - Echo: EF 35%;  B. 07/24/2011 - Cath: nonobs dzs ef 35%, 2+MR C. Echo 01/06/13: EF 40-45%, mild LVH, mlid MR, mod biatrial enlargment, mild RV dilatation, mod TR, PASP 54 mmHg  . Systolic CHF, chronic     A. Diag 07/2011, EF 35%  . Lumbar disc disease     Laminectomy 2001  . History of endometrial cancer   . Hyperlipidemia   . Rheumatoid arthritis     Treated in past with Remicade and developed vasculitis  . Gout   . PONV (postoperative nausea and vomiting)   . NSTEMI (non-ST elevated myocardial infarction) 12/2012  . GERD (gastroesophageal reflux disease)   . Coronary artery dissection 12/2012    95% OM3 hazy lesion on cath, medically managed  . Lumbar compression fracture 2014    L3, L4   Past Surgical History  Procedure Laterality Date  . Appendectomy    . Abdominal hysterectomy  2000    Stage 1 endometrial cancer   . Hernia repair  09/2008  . Hernia repair  04/17/11    Lap ventral incisional hernia repair with mesh   . Gastric biopsy      Benign  . Excision of lipoma    . Lumbar laminectomy  2001  . Hammer toe surgery    . Cardiac catheterization  01/08/13    OM3 95% lesion felt to represent coronary dissection, mild luminal irregularities elsewhere; EF 40-45%; medically managed   Family History  Problem Relation Age of Onset  . Cancer Father    History  Substance Use Topics  . Smoking status: Never Smoker   . Smokeless tobacco: Never Used  . Alcohol Use: Yes     Comment: Rare   OB History   Grav Para Term Preterm Abortions TAB SAB Ect Mult Living                 Review of Systems  Respiratory: Positive for shortness of breath.   Cardiovascular: Positive for chest pain.  All other systems reviewed and are negative.   Allergies  Codeine and Penicillins  Home Medications   Prior to Admission medications   Medication Sig Start Date End Date Taking? Authorizing Provider  atorvastatin (LIPITOR) 40 MG tablet Take 40 mg by mouth daily.   Yes Historical Provider, MD  Cholecalciferol (VITAMIN D) 2000 UNITS CAPS Take 2,000 Units by mouth daily.   Yes Historical Provider, MD  digoxin (LANOXIN) 0.125 MG tablet Take 1 tablet (0.125 mg total) by mouth daily. 08/26/13  Yes Fredirick Maudlin, MD  febuxostat (ULORIC) 40 MG tablet Take 40 mg by mouth daily.   Yes Historical Provider, MD  furosemide (LASIX) 40 MG tablet Take 1 tablet (40 mg total) by mouth 2 (two) times daily. 11/19/13  Yes Jodelle Gross, NP  gabapentin (NEURONTIN) 300 MG capsule Take 300 mg by mouth at bedtime.   Yes Historical Provider, MD  hydroxychloroquine (PLAQUENIL) 200 MG tablet Take 1 tablet by mouth Twice daily. 07/19/12  Yes Historical Provider, MD  Hypromellose (GENTEAL OP) Place 1 drop into both eyes daily as needed. Tired Eyes   Yes Historical Provider, MD  isosorbide dinitrate (ISORDIL) 5 MG tablet Take 10 mg by mouth  2 (two) times daily.  01/16/14  Yes Historical Provider, MD  losartan (COZAAR) 50 MG tablet Take 25 mg by mouth daily.   Yes Historical Provider, MD  metoprolol succinate (TOPROL-XL) 50 MG 24 hr tablet Take 50 mg by mouth daily.  01/13/14  Yes Jodelle Gross, NP  omeprazole (PRILOSEC) 20 MG capsule Take 20 mg by mouth daily.   Yes Historical Provider, MD  potassium chloride (K-DUR,KLOR-CON) 10 MEQ tablet Take 10 mEq by mouth 2 (two) times daily.  07/24/13  Yes Historical Provider, MD  predniSONE (DELTASONE) 1 MG tablet Take 2 mg by mouth daily with breakfast.  12/25/13  Yes Historical Provider, MD  warfarin (COUMADIN) 5 MG tablet Take 2.5-5 mg by mouth daily. Take 1 tablet daily except 1/2 tablet on Mondays and Thursdays 07/08/13  Yes Laqueta Linden, MD  nitroGLYCERIN (NITROSTAT) 0.4 MG SL tablet Place 1 tablet (0.4 mg total) under the tongue every 5 (five) minutes as needed for chest pain. 01/16/13   Roger A Arguello, PA-C  traMADol (ULTRAM) 50 MG tablet Take 1 tablet (50 mg total) by mouth every 6 (six) hours as needed for moderate pain. 08/26/13   Fredirick Maudlin, MD   BP 94/75  Pulse 55  Temp(Src) 97.5 F (36.4 C) (Oral)  Resp 20  Ht 5\' 5"  (1.651 m)  Wt 175 lb (79.379 kg)  BMI 29.12 kg/m2  SpO2 97% Physical Exam  Constitutional: Marissa [name] is oriented to person, place, and time. Marissa [name] appears well-developed and well-nourished. No distress.  HENT:  Head: Normocephalic and atraumatic.  Right Ear: Hearing normal.  Left Ear: Hearing normal.  Nose: Nose normal.  Mouth/Throat: Oropharynx is clear and moist and mucous membranes are normal.  Eyes: Conjunctivae and EOM are normal. Pupils are equal, round, and reactive to light.  Neck: Normal range of motion. Neck supple.  Cardiovascular: Regular rhythm, S1 normal and S2 normal.  Exam reveals no gallop and no friction rub.   No murmur heard. Pulmonary/Chest: Effort normal. No respiratory distress. Marissa [name] exhibits no tenderness.  Mild  tachypnea. Sounds a little diminished with posterior crackles at the bases of both lungs.   Abdominal: Soft. Normal appearance and bowel sounds are normal. There is no hepatosplenomegaly. There is no tenderness. There is no rebound, no guarding, no tenderness at McBurney's point and negative Murphy's sign. No hernia.  Musculoskeletal: Normal range of motion. Marissa [name] exhibits edema.  +2 pitting edema in both lower extremities.   Neurological: Marissa [name] is alert and oriented to person, place, and time. Marissa [name] has normal strength. No cranial nerve deficit or sensory deficit. Coordination  normal. GCS eye subscore is 4. GCS verbal subscore is 5. GCS motor subscore is 6.  Skin: Skin is warm, dry and intact. No rash noted. No cyanosis.  Psychiatric: Marissa [name] has a normal mood and affect. Her speech is normal and behavior is normal. Thought content normal.    ED Course  Procedures (including critical care time) DIAGNOSTIC STUDIES: Oxygen Saturation is 97% on RA, normal by my interpretation.    COORDINATION OF CARE: 4:12 PM- Pt advised of plan for treatment which includes medication, radiology, and labs and pt agrees.  Labs Review Labs Reviewed  COMPREHENSIVE METABOLIC PANEL - Abnormal; Notable for the following:    Glucose, Bld 121 (*)    BUN 40 (*)    Creatinine, Ser 1.88 (*)    GFR calc non Af Amer 24 (*)    GFR calc Af Amer 28 (*)    All other components within normal limits  URINALYSIS, ROUTINE W REFLEX MICROSCOPIC - Abnormal; Notable for the following:    Leukocytes, UA TRACE (*)    All other components within normal limits  PRO B NATRIURETIC PEPTIDE - Abnormal; Notable for the following:    Pro B Natriuretic peptide (BNP) 24961.0 (*)    All other components within normal limits  URINE MICROSCOPIC-ADD ON - Abnormal; Notable for the following:    Bacteria, UA FEW (*)    Casts HYALINE CASTS (*)    All other components within normal limits  URINE CULTURE  CBC WITH DIFFERENTIAL  TROPONIN I     Imaging Review Dg Chest 2 View  01/20/2014   CLINICAL DATA:  Chest pain and shortness of breath.  EXAM: CHEST  2 VIEW  COMPARISON:  01/16/2014  FINDINGS: Enlargement of the cardiac silhouette is unchanged. Thoracic aorta is mildly tortuous. Thoracic aortic calcification is again seen. Eventration of the right hemidiaphragm is unchanged with associated right basilar atelectasis. The lungs are otherwise clear. There is no pleural effusion or pneumothorax. No acute osseous abnormality is seen.  IMPRESSION: No active cardiopulmonary disease.   Electronically Signed   By: Sebastian Ache   On: 01/20/2014 17:10     EKG Interpretation None       Date: 01/20/2014  Rate: 59  Rhythm: atrial fibrillation  QRS Axis: right  Intervals: QT prolonged  ST/T Wave abnormalities: nonspecific ST/T changes  Conduction Disutrbances:none  Narrative Interpretation:   Old EKG Reviewed: unchanged    MDM   Final diagnoses:  None  Chest Pain CHF   Presents to the ER for evaluation of chest pain and shortness of breath. Patient reports being evaluated in the ER for this 2 other times in the past week. Marissa [name] has been experiencing intermittent chest pain and has had progressive worsening of her shortness of breath.  Patient does take Lasix, 40 mg twice a day for history of congestive heart failure. Marissa [name] also has a history of major fibrillation. EKG shows atrial fibrillation. There is also some wandering baseline which makes interpretation difficult, no clear evidence of ischemia or infarct. Troponin was negative. Patient's BNP is markedly elevated. Marissa [name] did have decreased aeration of both lungs with mild crackles. Although chest x-ray does not show overt pulmonary edema, I suspect her symptoms are secondary to volume overload. Patient initiated on Lasix at arrival and will require hospitalization for further diuresis.  I personally performed the services described in this documentation, which was scribed in my  presence. The recorded information has been reviewed and is accurate.'    Canary Brim.  Blinda Leatherwood, MD 01/20/14 1807

## 2014-01-20 NOTE — ED Notes (Signed)
Pt with mid CP since this morning, third time this week per family, + SOB, seen by PCP yesterday

## 2014-01-21 ENCOUNTER — Other Ambulatory Visit (HOSPITAL_COMMUNITY): Payer: Medicare Other

## 2014-01-21 ENCOUNTER — Inpatient Hospital Stay (HOSPITAL_COMMUNITY): Payer: Medicare Other

## 2014-01-21 ENCOUNTER — Ambulatory Visit (HOSPITAL_COMMUNITY): Payer: Medicare Other | Attending: Adult Health

## 2014-01-21 DIAGNOSIS — I509 Heart failure, unspecified: Secondary | ICD-10-CM

## 2014-01-21 DIAGNOSIS — I319 Disease of pericardium, unspecified: Secondary | ICD-10-CM

## 2014-01-21 DIAGNOSIS — I4891 Unspecified atrial fibrillation: Secondary | ICD-10-CM

## 2014-01-21 DIAGNOSIS — I5023 Acute on chronic systolic (congestive) heart failure: Secondary | ICD-10-CM

## 2014-01-21 LAB — BASIC METABOLIC PANEL
Anion gap: 11 (ref 5–15)
BUN: 42 mg/dL — AB (ref 6–23)
CALCIUM: 10 mg/dL (ref 8.4–10.5)
CO2: 29 mEq/L (ref 19–32)
CREATININE: 2 mg/dL — AB (ref 0.50–1.10)
Chloride: 107 mEq/L (ref 96–112)
GFR calc non Af Amer: 23 mL/min — ABNORMAL LOW (ref >90)
GFR, EST AFRICAN AMERICAN: 26 mL/min — AB (ref >90)
Glucose, Bld: 94 mg/dL (ref 70–99)
POTASSIUM: 4.3 meq/L (ref 3.7–5.3)
Sodium: 147 mEq/L (ref 137–147)

## 2014-01-21 LAB — CBC
HEMATOCRIT: 37 % (ref 36.0–46.0)
HEMOGLOBIN: 11.8 g/dL — AB (ref 12.0–15.0)
MCH: 29.2 pg (ref 26.0–34.0)
MCHC: 31.9 g/dL (ref 30.0–36.0)
MCV: 91.6 fL (ref 78.0–100.0)
Platelets: 190 10*3/uL (ref 150–400)
RBC: 4.04 MIL/uL (ref 3.87–5.11)
RDW: 15.6 % — AB (ref 11.5–15.5)
WBC: 7.1 10*3/uL (ref 4.0–10.5)

## 2014-01-21 LAB — PRO B NATRIURETIC PEPTIDE: Pro B Natriuretic peptide (BNP): 17548 pg/mL — ABNORMAL HIGH (ref 0–450)

## 2014-01-21 LAB — PROTIME-INR
INR: 3.24 — ABNORMAL HIGH (ref 0.00–1.49)
Prothrombin Time: 33.1 seconds — ABNORMAL HIGH (ref 11.6–15.2)

## 2014-01-21 LAB — TSH: TSH: 0.688 u[IU]/mL (ref 0.350–4.500)

## 2014-01-21 MED ORDER — IOHEXOL 300 MG/ML  SOLN
50.0000 mL | Freq: Once | INTRAMUSCULAR | Status: AC | PRN
  Administered 2014-01-21: 50 mL via ORAL

## 2014-01-21 MED ORDER — FUROSEMIDE 10 MG/ML IJ SOLN
60.0000 mg | Freq: Two times a day (BID) | INTRAMUSCULAR | Status: DC
  Administered 2014-01-21 – 2014-01-22 (×2): 60 mg via INTRAVENOUS
  Filled 2014-01-21 (×2): qty 6

## 2014-01-21 MED ORDER — WARFARIN - PHARMACIST DOSING INPATIENT: Status: DC

## 2014-01-21 NOTE — Progress Notes (Signed)
ANTICOAGULATION CONSULT NOTE - Initial Consult  Pharmacy Consult for Coumadin (chronic Rx PTA) Indication: atrial fibrillation  Allergies  Allergen Reactions  . Codeine Other (See Comments)    Makes patient sleepy.  Marland Kitchen Penicillins Rash    Patient Measurements: Height: 5\' 5"  (165.1 cm) Weight: 175 lb 4.3 oz (79.5 kg) IBW/kg (Calculated) : 57  Vital Signs: Temp: 97.9 F (36.6 C) (07/15 0652) Temp src: Oral (07/15 0652) BP: 103/59 mmHg (07/15 0652) Pulse Rate: 49 (07/15 0652)  Labs:  Recent Labs  01/20/14 1615 01/21/14 0640  HGB 12.7 11.8*  HCT 40.1 37.0  PLT 212 190  LABPROT 33.7* 33.1*  INR 3.32* 3.24*  CREATININE 1.88* 2.00*  TROPONINI <0.30  --    Medical History: Past Medical History  Diagnosis Date  . Permanent atrial fibrillation     A.  Chronic Coumadin  . MVP (mitral valve prolapse)   . Essential hypertension, benign   . Nonischemic cardiomyopathy 2001    A.  07/23/11 - Echo: EF 35%;  B. 07/24/2011 - Cath: nonobs dzs ef 35%, 2+MR C. Echo 01/06/13: EF 40-45%, mild LVH, mlid MR, mod biatrial enlargment, mild RV dilatation, mod TR, PASP 54 mmHg  . Systolic CHF, chronic     A. Diag 07/2011, EF 35%  . Lumbar disc disease     Laminectomy 2001  . History of endometrial cancer   . Hyperlipidemia   . Rheumatoid arthritis     Treated in past with Remicade and developed vasculitis  . Gout   . PONV (postoperative nausea and vomiting)   . NSTEMI (non-ST elevated myocardial infarction) 12/2012  . GERD (gastroesophageal reflux disease)   . Coronary artery dissection 12/2012    95% OM3 hazy lesion on cath, medically managed  . Lumbar compression fracture 2014    L3, L4   Medications:  Prescriptions prior to admission  Medication Sig Dispense Refill  . atorvastatin (LIPITOR) 40 MG tablet Take 40 mg by mouth daily.      . Cholecalciferol (VITAMIN D) 2000 UNITS CAPS Take 2,000 Units by mouth daily.      . digoxin (LANOXIN) 0.125 MG tablet Take 1 tablet (0.125 mg  total) by mouth daily.  30 tablet  12  . febuxostat (ULORIC) 40 MG tablet Take 40 mg by mouth daily.      . furosemide (LASIX) 40 MG tablet Take 1 tablet (40 mg total) by mouth 2 (two) times daily.  30 tablet  6  . gabapentin (NEURONTIN) 300 MG capsule Take 300 mg by mouth at bedtime.      . hydroxychloroquine (PLAQUENIL) 200 MG tablet Take 1 tablet by mouth Twice daily.      . Hypromellose (GENTEAL OP) Place 1 drop into both eyes daily as needed. Tired Eyes      . isosorbide dinitrate (ISORDIL) 5 MG tablet Take 10 mg by mouth 2 (two) times daily.       01/2013 losartan (COZAAR) 50 MG tablet Take 25 mg by mouth daily.      . metoprolol succinate (TOPROL-XL) 50 MG 24 hr tablet Take 50 mg by mouth daily.       Marland Kitchen omeprazole (PRILOSEC) 20 MG capsule Take 20 mg by mouth daily.      . potassium chloride (K-DUR,KLOR-CON) 10 MEQ tablet Take 10 mEq by mouth 2 (two) times daily.       . predniSONE (DELTASONE) 1 MG tablet Take 2 mg by mouth daily with breakfast.       .  warfarin (COUMADIN) 5 MG tablet Take 2.5-5 mg by mouth daily. Take 1 tablet daily except 1/2 tablet on Mondays and Thursdays      . nitroGLYCERIN (NITROSTAT) 0.4 MG SL tablet Place 1 tablet (0.4 mg total) under the tongue every 5 (five) minutes as needed for chest pain.  25 tablet  12  . traMADol (ULTRAM) 50 MG tablet Take 1 tablet (50 mg total) by mouth every 6 (six) hours as needed for moderate pain.  30 tablet  5   Assessment: 80yo female on chronic Coumadin PTA for h/o afib.  INR is supratherapeutic on admission.  Home dose is listed above.    Goal of Therapy:  INR 2-3 Monitor platelets by anticoagulation protocol: Yes   Plan:  HOLD coumadin today INR daily  Margo Aye, Rajohn Henery A 01/21/2014,8:59 AM

## 2014-01-21 NOTE — Progress Notes (Signed)
. Subjective: Marissa [name] presented to the emergency room yesterday after having recurrent chest pain . She describes substernal chest this lasting approximately a minute. She denies any radiation. She did not experience diaphoresis or vomiting. She did not take nitroglycerin. It appears she was admitted more for heart failure then for pain. She did not have edema chest x-ray. BNP was elevated. She has been treated with IV Lasix. She denies any weight gain. Objective: Vital signs in last 24 hours: Filed Vitals:   01/20/14 2020 01/20/14 2030 01/20/14 2201 01/21/14 0652  BP: 103/70 100/54 108/63 103/59  Pulse: 48 49 47 49  Temp: 97.9 F (36.6 C)  97.9 F (36.6 C) 97.9 F (36.6 C)  TempSrc: Oral  Oral Oral  Resp: 21 15 20 20   Height:   5\' 5"  (1.651 m)   Weight:   175 lb 4.3 oz (79.5 kg)   SpO2: 96% 96% 97% 94%   Weight change:   Intake/Output Summary (Last 24 hours) at 01/21/14 1228 Last data filed at 01/21/14 0913  Gross per 24 hour  Intake    123 ml  Output    600 ml  Net   -477 ml    Physical Exam: Alert and in no distress. Lungs reveal basilar rales. Heart bradycardic and irregular. Extremities reveal no edema.  Lab Results:    Results for orders placed during the hospital encounter of 01/20/14 (from the past 24 hour(s))  CBC WITH DIFFERENTIAL     Status: None   Collection Time    01/20/14  4:15 PM      Result Value Ref Range   WBC 8.4  4.0 - 10.5 K/uL   RBC 4.36  3.87 - 5.11 MIL/uL   Hemoglobin 12.7  12.0 - 15.0 g/dL   HCT 01/22/14  01/22/14 - 19.1 %   MCV 92.0  78.0 - 100.0 fL   MCH 29.1  26.0 - 34.0 pg   MCHC 31.7  30.0 - 36.0 g/dL   RDW 47.8  29.5 - 62.1 %   Platelets 212  150 - 400 K/uL   Neutrophils Relative % 73  43 - 77 %   Neutro Abs 6.2  1.7 - 7.7 K/uL   Lymphocytes Relative 18  12 - 46 %   Lymphs Abs 1.5  0.7 - 4.0 K/uL   Monocytes Relative 6  3 - 12 %   Monocytes Absolute 0.5  0.1 - 1.0 K/uL   Eosinophils Relative 2  0 - 5 %   Eosinophils Absolute 0.2  0.0 - 0.7  K/uL   Basophils Relative 1  0 - 1 %   Basophils Absolute 0.0  0.0 - 0.1 K/uL  COMPREHENSIVE METABOLIC PANEL     Status: Abnormal   Collection Time    01/20/14  4:15 PM      Result Value Ref Range   Sodium 146  137 - 147 mEq/L   Potassium 4.1  3.7 - 5.3 mEq/L   Chloride 105  96 - 112 mEq/L   CO2 28  19 - 32 mEq/L   Glucose, Bld 121 (*) 70 - 99 mg/dL   BUN 40 (*) 6 - 23 mg/dL   Creatinine, Ser 65.7 (*) 0.50 - 1.10 mg/dL   Calcium 01/22/14  8.4 - 8.46 mg/dL   Total Protein 7.0  6.0 - 8.3 g/dL   Albumin 3.6  3.5 - 5.2 g/dL   AST 25  0 - 37 U/L   ALT 19  0 - 35 U/L  Alkaline Phosphatase 93  39 - 117 U/L   Total Bilirubin 0.5  0.3 - 1.2 mg/dL   GFR calc non Af Amer 24 (*) >90 mL/min   GFR calc Af Amer 28 (*) >90 mL/min   Anion gap 13  5 - 15  TROPONIN I     Status: None   Collection Time    01/20/14  4:15 PM      Result Value Ref Range   Troponin I <0.30  <0.30 ng/mL  PRO B NATRIURETIC PEPTIDE     Status: Abnormal   Collection Time    01/20/14  4:15 PM      Result Value Ref Range   Pro B Natriuretic peptide (BNP) 24961.0 (*) 0 - 450 pg/mL  DIGOXIN LEVEL     Status: Abnormal   Collection Time    01/20/14  4:15 PM      Result Value Ref Range   Digoxin Level <0.3 (*) 0.8 - 2.0 ng/mL  PROTIME-INR     Status: Abnormal   Collection Time    01/20/14  4:15 PM      Result Value Ref Range   Prothrombin Time 33.7 (*) 11.6 - 15.2 seconds   INR 3.32 (*) 0.00 - 1.49  URINALYSIS, ROUTINE W REFLEX MICROSCOPIC     Status: Abnormal   Collection Time    01/20/14  4:30 PM      Result Value Ref Range   Color, Urine YELLOW  YELLOW   APPearance CLEAR  CLEAR   Specific Gravity, Urine 1.020  1.005 - 1.030   pH 5.5  5.0 - 8.0   Glucose, UA NEGATIVE  NEGATIVE mg/dL   Hgb urine dipstick NEGATIVE  NEGATIVE   Bilirubin Urine NEGATIVE  NEGATIVE   Ketones, ur NEGATIVE  NEGATIVE mg/dL   Protein, ur NEGATIVE  NEGATIVE mg/dL   Urobilinogen, UA 0.2  0.0 - 1.0 mg/dL   Nitrite NEGATIVE  NEGATIVE    Leukocytes, UA TRACE (*) NEGATIVE  URINE MICROSCOPIC-ADD ON     Status: Abnormal   Collection Time    01/20/14  4:30 PM      Result Value Ref Range   Squamous Epithelial / LPF RARE  RARE   WBC, UA 11-20  <3 WBC/hpf   Bacteria, UA FEW (*) RARE   Casts HYALINE CASTS (*) NEGATIVE  BASIC METABOLIC PANEL     Status: Abnormal   Collection Time    01/21/14  6:40 AM      Result Value Ref Range   Sodium 147  137 - 147 mEq/L   Potassium 4.3  3.7 - 5.3 mEq/L   Chloride 107  96 - 112 mEq/L   CO2 29  19 - 32 mEq/L   Glucose, Bld 94  70 - 99 mg/dL   BUN 42 (*) 6 - 23 mg/dL   Creatinine, Ser 6.41 (*) 0.50 - 1.10 mg/dL   Calcium 58.3  8.4 - 09.4 mg/dL   GFR calc non Af Amer 23 (*) >90 mL/min   GFR calc Af Amer 26 (*) >90 mL/min   Anion gap 11  5 - 15  PRO B NATRIURETIC PEPTIDE     Status: Abnormal   Collection Time    01/21/14  6:40 AM      Result Value Ref Range   Pro B Natriuretic peptide (BNP) 17548.0 (*) 0 - 450 pg/mL  CBC     Status: Abnormal   Collection Time    01/21/14  6:40 AM  Result Value Ref Range   WBC 7.1  4.0 - 10.5 K/uL   RBC 4.04  3.87 - 5.11 MIL/uL   Hemoglobin 11.8 (*) 12.0 - 15.0 g/dL   HCT 25.9  56.3 - 87.5 %   MCV 91.6  78.0 - 100.0 fL   MCH 29.2  26.0 - 34.0 pg   MCHC 31.9  30.0 - 36.0 g/dL   RDW 64.3 (*) 32.9 - 51.8 %   Platelets 190  150 - 400 K/uL  PROTIME-INR     Status: Abnormal   Collection Time    01/21/14  6:40 AM      Result Value Ref Range   Prothrombin Time 33.1 (*) 11.6 - 15.2 seconds   INR 3.24 (*) 0.00 - 1.49     ABGS No results found for this basename: PHART, PCO2, PO2ART, TCO2, HCO3,  in the last 72 hours CULTURES No results found for this or any previous visit (from the past 240 hour(s)). Studies/Results: Dg Chest 2 View  01/20/2014   CLINICAL DATA:  Chest pain and shortness of breath.  EXAM: CHEST  2 VIEW  COMPARISON:  01/16/2014  FINDINGS: Enlargement of the cardiac silhouette is unchanged. Thoracic aorta is mildly tortuous.  Thoracic aortic calcification is again seen. Eventration of the right hemidiaphragm is unchanged with associated right basilar atelectasis. The lungs are otherwise clear. There is no pleural effusion or pneumothorax. No acute osseous abnormality is seen.  IMPRESSION: No active cardiopulmonary disease.   Electronically Signed   By: Sebastian Ache   On: 01/20/2014 17:10   Micro Results: No results found for this or any previous visit (from the past 240 hour(s)). Studies/Results: Dg Chest 2 View  01/20/2014   CLINICAL DATA:  Chest pain and shortness of breath.  EXAM: CHEST  2 VIEW  COMPARISON:  01/16/2014  FINDINGS: Enlargement of the cardiac silhouette is unchanged. Thoracic aorta is mildly tortuous. Thoracic aortic calcification is again seen. Eventration of the right hemidiaphragm is unchanged with associated right basilar atelectasis. The lungs are otherwise clear. There is no pleural effusion or pneumothorax. No acute osseous abnormality is seen.  IMPRESSION: No active cardiopulmonary disease.   Electronically Signed   By: Sebastian Ache   On: 01/20/2014 17:10   Medications:  I have reviewed the patient's current medications Scheduled Meds: . atorvastatin  40 mg Oral q1800  . digoxin  0.125 mg Oral Daily  . febuxostat  40 mg Oral Daily  . furosemide  40 mg Intravenous BID  . gabapentin  300 mg Oral QHS  . hydroxychloroquine  200 mg Oral BID  . pantoprazole  40 mg Oral Daily  . potassium chloride  10 mEq Oral BID  . predniSONE  2 mg Oral Q breakfast  . sodium chloride  3 mL Intravenous Q12H  . sodium chloride  3 mL Intravenous Q12H  . Warfarin - Pharmacist Dosing Inpatient   Does not apply Q24H   Continuous Infusions:  PRN Meds:.sodium chloride, nitroGLYCERIN, ondansetron (ZOFRAN) IV, ondansetron, sodium chloride, traMADol   Assessment/Plan: #1. Chest pain. Troponins are negative x3. Her EKG is not available and cannot be accessed on the computer. Her telemetry reveals slow atrial  fibrillation. She is pain free now. Consult cardiology. #2. Chronic systolic heart failure. Continue Lasix. #3. Chronic kidney disease. Creatinine is 2.0. #4. Osteoporosis. Recent vertebral fracture. Forteo was discontinued last Friday. Active Problems:   CHF (congestive heart failure)     LOS: 1 day   Marissa [name] 01/21/2014, 12:28 PM

## 2014-01-21 NOTE — Consult Note (Signed)
Primary cardiologist: Dr Prentice Docker Consulting cardiologist: Dr Dina Rich  Clinical Summary Marissa [name] is a 80 y.o.female history of CAD, chronic systolic heart failure LVEF 20-25% by echo 08/2013, restrictive diastolic dysfunction, permanent afib, rheumatoid arthritis with associated vasculitis admitted with several days of worsening SOB.   Reports symptoms progressing over the last week and a half, with significant worsening over the last day or so. Increased LE edema. There had been some thought that her SOB was related to side effects to a newly started medication forteo, and it was stopped. She reports compliant with diuretic and low sodium diet at home. She describes occasional epigastric/mid chest pain 6/10 with some SOB. Can occur at rest or with exertion. Worst with deep breaths, can sometimes be associated with eating. Did get somewhat better with NG at home.    History of NSTEMI 01/2013, cath showed isolated 95% disease in small tortous OM3 thought to be the culprit, too small for intervention, managed medically. She had a VT arrest shortly after cath thought to be induced by ischemia, succesful CPR and defibrillation.  Medical therapy has been limited by bradycardia and pauses up to 3 seconds, Toprol and digoxin have been stopped previously.   INR 3.3, digoxin <0.3, pro-BNP 2491, K 4.1, Cr 1.88, GFR 24, Hgb 12.7, Hct 40, trop negative.  CXR no acute process.  Allergies  Allergen Reactions  . Codeine Other (See Comments)    Makes patient sleepy.  Marland Kitchen Penicillins Rash    Medications Scheduled Medications: . atorvastatin  40 mg Oral q1800  . digoxin  0.125 mg Oral Daily  . febuxostat  40 mg Oral Daily  . furosemide  40 mg Intravenous BID  . gabapentin  300 mg Oral QHS  . hydroxychloroquine  200 mg Oral BID  . pantoprazole  40 mg Oral Daily  . potassium chloride  10 mEq Oral BID  . predniSONE  2 mg Oral Q breakfast  . sodium chloride  3 mL Intravenous Q12H  .  sodium chloride  3 mL Intravenous Q12H  . Warfarin - Pharmacist Dosing Inpatient   Does not apply Q24H     Infusions:     PRN Medications:  sodium chloride, nitroGLYCERIN, ondansetron (ZOFRAN) IV, ondansetron, sodium chloride, traMADol   Past Medical History  Diagnosis Date  . Permanent atrial fibrillation     A.  Chronic Coumadin  . MVP (mitral valve prolapse)   . Essential hypertension, benign   . Nonischemic cardiomyopathy 2001    A.  07/23/11 - Echo: EF 35%;  B. 07/24/2011 - Cath: nonobs dzs ef 35%, 2+MR C. Echo 01/06/13: EF 40-45%, mild LVH, mlid MR, mod biatrial enlargment, mild RV dilatation, mod TR, PASP 54 mmHg  . Systolic CHF, chronic     A. Diag 07/2011, EF 35%  . Lumbar disc disease     Laminectomy 2001  . History of endometrial cancer   . Hyperlipidemia   . Rheumatoid arthritis     Treated in past with Remicade and developed vasculitis  . Gout   . PONV (postoperative nausea and vomiting)   . NSTEMI (non-ST elevated myocardial infarction) 12/2012  . GERD (gastroesophageal reflux disease)   . Coronary artery dissection 12/2012    95% OM3 hazy lesion on cath, medically managed  . Lumbar compression fracture 2014    L3, L4    Past Surgical History  Procedure Laterality Date  . Appendectomy    . Abdominal hysterectomy  2000    Stage 1 endometrial cancer  .  Hernia repair  09/2008  . Hernia repair  04/17/11    Lap ventral incisional hernia repair with mesh   . Gastric biopsy      Benign  . Excision of lipoma    . Lumbar laminectomy  2001  . Hammer toe surgery    . Cardiac catheterization  01/08/13    OM3 95% lesion felt to represent coronary dissection, mild luminal irregularities elsewhere; EF 40-45%; medically managed    Family History  Problem Relation Age of Onset  . Cancer Father     Social History Ms. Barson reports that she has never smoked. She has never used smokeless tobacco. Ms. Artuso reports that she drinks alcohol.  Review of  Systems CONSTITUTIONAL: No weight loss, fever, chills, weakness or fatigue.  HEENT: Eyes: No visual loss, blurred vision, double vision or yellow sclerae. No hearing loss, sneezing, congestion, runny nose or sore throat.  SKIN: No rash or itching.  CARDIOVASCULAR: per HPI RESPIRATORY: +SOB GASTROINTESTINAL: No anorexia, nausea, vomiting or diarrhea. No abdominal pain or blood.  GENITOURINARY: no polyuria, no dysuria NEUROLOGICAL: No headache, dizziness, syncope, paralysis, ataxia, numbness or tingling in the extremities. No change in bowel or bladder control.  MUSCULOSKELETAL: No muscle, back pain, joint pain or stiffness.  HEMATOLOGIC: No anemia, bleeding or bruising.  LYMPHATICS: No enlarged nodes. No history of splenectomy.  PSYCHIATRIC: No history of depression or anxiety.      Physical Examination Blood pressure 103/59, pulse 49, temperature 97.9 F (36.6 C), temperature source Oral, resp. rate 20, height 5\' 5"  (1.651 m), weight 175 lb 4.3 oz (79.5 kg), SpO2 94.00%.  Intake/Output Summary (Last 24 hours) at 01/21/14 1303 Last data filed at 01/21/14 0913  Gross per 24 hour  Intake    123 ml  Output    600 ml  Net   -477 ml    HEENT: sclera clear  Cardiovascular: regular, rate 50, 2/6 murmur at apes, no JVD  Respiratory: faint crackles bilateral bases  GI: abdomen soft. Tender left lower quadrant with palpable superficial knot, no gross hematoma on exam.   MSK: 2+ bilateral LE edema  Neuro: no focal deficits  Psych: appropriate affect   Lab Results  Basic Metabolic Panel:  Recent Labs Lab 01/16/14 0953 01/20/14 1615 01/21/14 0640  NA 146 146 147  K 4.0 4.1 4.3  CL 108 105 107  CO2 24 28 29   GLUCOSE 145* 121* 94  BUN 41* 40* 42*  CREATININE 2.16* 1.88* 2.00*  CALCIUM 9.6 10.3 10.0    Liver Function Tests:  Recent Labs Lab 01/20/14 1615  AST 25  ALT 19  ALKPHOS 93  BILITOT 0.5  PROT 7.0  ALBUMIN 3.6    CBC:  Recent Labs Lab  01/16/14 0953 01/20/14 1615 01/21/14 0640  WBC 6.4 8.4 7.1  NEUTROABS 4.2 6.2  --   HGB 11.7* 12.7 11.8*  HCT 36.4 40.1 37.0  MCV 91.9 92.0 91.6  PLT 163 212 190    Cardiac Enzymes:  Recent Labs Lab 01/16/14 0953 01/20/14 1615  TROPONINI <0.30 <0.30    BNP: No components found with this basename: POCBNP,      Imaging Cath 01/2013 Procedural Findings:  Hemodynamics:  AO: 132/73 mmHg  LV: 138/7 mmHg  LVEDP: 13 mmHg  Coronary angiography:  Coronary dominance: Right  Left Main: Normal  Left Anterior Descending (LAD): Large in size with minor irregularities. The vessel wraps around the apex.  1st diagonal (D1): Normal in size with minor irregularities.  2nd diagonal (  D2): Normal in size with no significant disease.  3rd diagonal (D3): Very small in size.  Circumflex (LCx): Normal in size and nondominant. The vessel has no significant disease.  1st obtuse marginal: Small in size with no significant disease.  2nd obtuse marginal: Small in size with no significant disease.  3rd obtuse marginal: Normal in size with diffuse 95% disease proximally in a very tortuous segment. This is likely the culprit for non-ST elevation myocardial infarction.  Right Coronary Artery: Normal in size and dominant. There are minor luminal irregularities proximally. Otherwise no significant disease. Left ventriculography: Was not performed due to recent renal failure. EF by echo was 40-45%.  Final Conclusions:  1. Significant 1 vessel coronary artery disease with diffuse 95% stenosis in proximal OM 3. The affected area is significantly tortuous and the distal vessel distribution is relatively small in size. Thus, risks of PCI outweigh the benefits.  2. Normal LVEDP.  Recommendations:  Recommend medical therapy for coronary artery disease. We'll resume anticoagulation with heparin today. Warfarin can be resumed as well.   08/2013 Echo Study Conclusions  - Left ventricle: The cavity size was  normal. Wall thickness was normal. Systolic function was severely reduced. The estimated ejection fraction was in the range of 20% to 25%. Diffuse hypokinesis. Doppler parameters are consistent with restrictive physiology, indicative of decreased left ventricular diastolic compliance and/or increased left atrial pressure. - Aortic valve: Noncoronary cusp immobility was noted. Mild regurgitation directed centrally in the LVOT. - Mitral valve: Calcified annulus. Mild regurgitation directed centrally. - Left atrium: The atrium was severely dilated. - Right ventricle: Systolic function was reduced. - Right atrium: The atrium was severely dilated. - Tricuspid valve: Moderate regurgitation. - Pulmonary arteries: Systolic pressure was moderately increased. PA peak pressure: 52mm Hg (S). - Pericardium, extracardiac: A trivial pericardial effusion was identified posterior to the heart. Impressions:  - Compared to June 2014, there is substantial worsening of LV systolic function.    Impression/Recommendations 1. Acute on chronic systolic heart failure - fairly recent drop in LVEF, per clinic notes considering repeat ischemic evaluation vs tachycardia induced CM. As outpatient have been working to more aggresively treat tachycardia/afib but therapy limited by episodes of bradycardia, and reported pauses.  - only of uop with IV lasix 40mg  bid, will increase to 60mg  bid  - metoprolol on hold in setting of bradycardia - f/u repeat echo, if systolic dysfunction persists may need to consider pacemaker in setting of bradycardia and inability to appropriately treat systolic heart failure, possible related to tachycardia induced CM.    2. Afib - rate controlling therapy has been limited by episodes of bradycardia, and reported pauses and junctional escape rhythms. Remains on digoxin for rate control, Toprol on hold due to bradycardia and soft bp's.  - she remains on coumadin for anticoag.    3. Chest pain/epigastric pain - mixed in description for cardiac pain, reports it is worst with deep breaths, and can be sometimes related to food. Also better with NG - she has known history of single vessel CAD in 2014, this could be causing some symptoms. Could be related to being volume overloaded as well - no evidence of ACS   4. Abdominal pain - lingering pain left lower quadrant after fall last month with continued swelling - CT scan 12/15/23 showed subcutaneous hematoma without extension into abdominal wall - patient reports continued symptoms over the last month with non resolving swelling, she remains on anticoagulation - will repeat CT abdomen pelvis to  varify no hematoma has evolved.     Dina Rich, M.D., F.A.C.C.

## 2014-01-21 NOTE — Care Management Utilization Note (Signed)
UR completed 

## 2014-01-21 NOTE — Progress Notes (Signed)
Patients heart rate ranging from 49-53 bpm. Patient had digoxin scheduled for 1000 was not given. MD notified. Will continue to monitor patient at this time.

## 2014-01-21 NOTE — Progress Notes (Signed)
Isosorbide, metoprolol, and losartan are meds this patient normally takes and her daughter wants to know why she is not taking them while she is here. She states the patients heart is used to these meds and she should be taking them. Bennett Scrape RN

## 2014-01-21 NOTE — Progress Notes (Signed)
  Echocardiogram 2D Echocardiogram has been performed.  Marissa [name] 01/21/2014, 4:40 PM

## 2014-01-22 LAB — PROTIME-INR
INR: 2.9 — ABNORMAL HIGH (ref 0.00–1.49)
Prothrombin Time: 30.3 seconds — ABNORMAL HIGH (ref 11.6–15.2)

## 2014-01-22 MED ORDER — CIPROFLOXACIN HCL 250 MG PO TABS
250.0000 mg | ORAL_TABLET | Freq: Two times a day (BID) | ORAL | Status: DC
  Administered 2014-01-22 – 2014-01-23 (×3): 250 mg via ORAL
  Filled 2014-01-22 (×5): qty 1

## 2014-01-22 MED ORDER — WARFARIN SODIUM 2.5 MG PO TABS: 2.5000 mg | ORAL_TABLET | Freq: Once | ORAL | Status: DC

## 2014-01-22 MED ORDER — LOSARTAN POTASSIUM 25 MG PO TABS
25.0000 mg | ORAL_TABLET | Freq: Every day | ORAL | Status: DC
  Administered 2014-01-22 – 2014-01-23 (×2): 25 mg via ORAL
  Filled 2014-01-22 (×2): qty 1

## 2014-01-22 MED ORDER — FUROSEMIDE 10 MG/ML IJ SOLN
80.0000 mg | Freq: Two times a day (BID) | INTRAMUSCULAR | Status: DC
  Administered 2014-01-22 – 2014-01-25 (×6): 80 mg via INTRAVENOUS
  Filled 2014-01-22 (×9): qty 8

## 2014-01-22 NOTE — Progress Notes (Signed)
Pt trans from [facility] today for eval of pacer need. INR 2.9, no coumadin ordered. Spoke with D Dunn PA. Will hold this evening dose till eval in am.

## 2014-01-22 NOTE — Progress Notes (Signed)
ANTICOAGULATION CONSULT NOTE - follow up  Pharmacy Consult for Coumadin (chronic Rx PTA) Indication: atrial fibrillation  Allergies  Allergen Reactions  . Codeine Other (See Comments)    Makes patient sleepy.  Marland Kitchen Penicillins Rash   Patient Measurements: Height: 5\' 5"  (165.1 cm) Weight: 175 lb 4.3 oz (79.5 kg) IBW/kg (Calculated) : 57  Vital Signs: Temp: 97.5 F (36.4 C) (07/16 0558) Temp src: Oral (07/16 0558) BP: 101/62 mmHg (07/16 0558) Pulse Rate: 49 (07/16 0558)  Labs:  Recent Labs  01/20/14 1615 01/21/14 0640 01/22/14 0604  HGB 12.7 11.8*  --   HCT 40.1 37.0  --   PLT 212 190  --   LABPROT 33.7* 33.1* 30.3*  INR 3.32* 3.24* 2.90*  CREATININE 1.88* 2.00*  --   TROPONINI <0.30  --   --    Medical History: Past Medical History  Diagnosis Date  . Permanent atrial fibrillation     A.  Chronic Coumadin  . MVP (mitral valve prolapse)   . Essential hypertension, benign   . Nonischemic cardiomyopathy 2001    A.  07/23/11 - Echo: EF 35%;  B. 07/24/2011 - Cath: nonobs dzs ef 35%, 2+MR C. Echo 01/06/13: EF 40-45%, mild LVH, mlid MR, mod biatrial enlargment, mild RV dilatation, mod TR, PASP 54 mmHg  . Systolic CHF, chronic     A. Diag 07/2011, EF 35%  . Lumbar disc disease     Laminectomy 2001  . History of endometrial cancer   . Hyperlipidemia   . Rheumatoid arthritis     Treated in past with Remicade and developed vasculitis  . Gout   . PONV (postoperative nausea and vomiting)   . NSTEMI (non-ST elevated myocardial infarction) 12/2012  . GERD (gastroesophageal reflux disease)   . Coronary artery dissection 12/2012    95% OM3 hazy lesion on cath, medically managed  . Lumbar compression fracture 2014    L3, L4   Medications:  Prescriptions prior to admission  Medication Sig Dispense Refill  . atorvastatin (LIPITOR) 40 MG tablet Take 40 mg by mouth daily.      . Cholecalciferol (VITAMIN D) 2000 UNITS CAPS Take 2,000 Units by mouth daily.      . digoxin (LANOXIN)  0.125 MG tablet Take 1 tablet (0.125 mg total) by mouth daily.  30 tablet  12  . febuxostat (ULORIC) 40 MG tablet Take 40 mg by mouth daily.      . furosemide (LASIX) 40 MG tablet Take 1 tablet (40 mg total) by mouth 2 (two) times daily.  30 tablet  6  . gabapentin (NEURONTIN) 300 MG capsule Take 300 mg by mouth at bedtime.      . hydroxychloroquine (PLAQUENIL) 200 MG tablet Take 1 tablet by mouth Twice daily.      . Hypromellose (GENTEAL OP) Place 1 drop into both eyes daily as needed. Tired Eyes      . isosorbide dinitrate (ISORDIL) 5 MG tablet Take 10 mg by mouth 2 (two) times daily.       01/2013 losartan (COZAAR) 50 MG tablet Take 25 mg by mouth daily.      . metoprolol succinate (TOPROL-XL) 50 MG 24 hr tablet Take 50 mg by mouth daily.       Marland Kitchen omeprazole (PRILOSEC) 20 MG capsule Take 20 mg by mouth daily.      . potassium chloride (K-DUR,KLOR-CON) 10 MEQ tablet Take 10 mEq by mouth 2 (two) times daily.       . predniSONE (DELTASONE)  1 MG tablet Take 2 mg by mouth daily with breakfast.       . warfarin (COUMADIN) 5 MG tablet Take 2.5-5 mg by mouth daily. Take 1 tablet daily except 1/2 tablet on Mondays and Thursdays      . nitroGLYCERIN (NITROSTAT) 0.4 MG SL tablet Place 1 tablet (0.4 mg total) under the tongue every 5 (five) minutes as needed for chest pain.  25 tablet  12  . traMADol (ULTRAM) 50 MG tablet Take 1 tablet (50 mg total) by mouth every 6 (six) hours as needed for moderate pain.  30 tablet  5   Assessment: 80yo female on chronic Coumadin PTA for h/o afib.  INR was supratherapeutic on admission but has now trended down.  Home dose is listed above.  Pt is on Cipro which could interact with Coumadin to increase INR.    Goal of Therapy:  INR 2-3 Monitor platelets by anticoagulation protocol: Yes   Plan:  Coumadin 2.5mg  po today x 1 (home dose) INR daily  Margo Aye, Chai Routh A 01/22/2014,8:38 AM

## 2014-01-22 NOTE — Care Management Note (Addendum)
    Page 1 of 2   01/28/2014     3:29:42 PM CARE MANAGEMENT NOTE 01/28/2014  Patient:  Marissa [name], Marissa [name]   Account Number:  [id number]  Date Initiated:  01/22/2014  Documentation initiated by:  Anibal Henderson  Subjective/Objective Assessment:   Pt admitted with chest pain, CHF. Patient is from home. She lives alone, but has a paid caregiver through [employer]. Her daughter is also active in her care.  She is active with [facility] and [facility], and would like to continue with both     Action/Plan:   patient is being transferred to [facility],  for a possible pacemaker   Anticipated DC Date:  01/22/2014   Anticipated DC Plan:  HOME W HOME HEALTH SERVICES      DC Planning Services  CM consult      [facility] Choice  HOME HEALTH  Resumption Of Svcs/PTA Provider   Choice offered to / List presented to:  C-1 Patient           HH agency  [facility]   Status of service:  Completed, signed off Medicare Important Message given?  YES (If response is "NO", the following Medicare IM given date fields will be blank) Date Medicare IM given:  01/23/2014 Medicare IM given by:  HUTCHINSON,CRYSTAL Date Additional Medicare IM given:  01/28/2014 Additional Medicare IM given by:  Sharnetta Gielow  Discharge Disposition:    Per UR Regulation:  Reviewed for med. necessity/level of care/duration of stay  If discussed at Long Length of Stay Meetings, dates discussed:    Comments:  01/26/14 1120 Oletta Cohn, RN, BSN, [place] [phone] Spoke with Darlin Drop of [facility] regarding [facility] services.  Pt cousing is employed at [employer] and does not want her as a caregiver.  Pt is satisfied with [facility] in all other respects.   Crystal Hutchinson RN, BSN, MSHL, CCM  Nurse - Case Manager,  (Unit [phone]  01/23/2014 Pt admitted with chest pain, CHF. Social:  From home alone, but has a paid caregiver through [employer].  Supportive DTR. HHS:  Active with [facility], RN, PT  ([facility]/Donna notified to resume orders).   01/22/14 1100  Anibal Henderson RN/CM

## 2014-01-22 NOTE — Progress Notes (Signed)
Subjective: Denies chest pain today. Shortness of breath is improved. Cardiology evaluation appreciated.  Objective: Vital signs in last 24 hours: Filed Vitals:   01/21/14 1437 01/21/14 1657 01/21/14 2006 01/22/14 0558  BP: 89/57 110/54 96/68 101/62  Pulse: 50  51 49  Temp: 98.3 F (36.8 C)  97.7 F (36.5 C) 97.5 F (36.4 C)  TempSrc: Oral  Oral Oral  Resp: 18  20 20   Height:      Weight:      SpO2: 99%  100% 94%   Weight change:   Intake/Output Summary (Last 24 hours) at 01/22/14 0748 Last data filed at 01/21/14 2005  Gross per 24 hour  Intake    483 ml  Output    650 ml  Net   -167 ml    Physical Exam:  .awake and alert in no distress. Lungs reveal basilar rales greater on the right. Heart irregularly irregular and bradycardic . Abdomen soft and nontender. Small hematoma unchanged by CT. Extremities reveal no significant edema.  Lab Results:    Results for orders placed during the hospital encounter of 01/20/14 (from the past 24 hour(s))  PROTIME-INR     Status: Abnormal   Collection Time    01/22/14  6:04 AM      Result Value Ref Range   Prothrombin Time 30.3 (*) 11.6 - 15.2 seconds   INR 2.90 (*) 0.00 - 1.49     ABGS No results found for this basename: PHART, PCO2, PO2ART, TCO2, HCO3,  in the last 72 hours CULTURES Recent Results (from the past 240 hour(s))  URINE CULTURE     Status: None   Collection Time    01/20/14  4:30 PM      Result Value Ref Range Status   Specimen Description URINE, CATHETERIZED   Final   Special Requests NONE   Final   Culture  Setup Time     Final   Value: 01/21/2014 00:33     Performed at [facility] Count     Final   Value: >=100,000 COLONIES/ML     Performed at [facility]   Culture     Final   Value: ESCHERICHIA COLI     Performed at [facility]   Report Status PENDING   Incomplete   Studies/Results: Ct Abdomen Pelvis Wo Contrast  01/21/2014   CLINICAL DATA:  Abdominal wall pain,  on Coumadin, history hypertension, CHF, appendectomy, mitral valve prolapse, atrial fibrillation, MI  EXAM: CT ABDOMEN AND PELVIS WITHOUT CONTRAST  TECHNIQUE: Multidetector CT imaging of the abdomen and pelvis was performed following the standard protocol without IV contrast. Sagittal and coronal MPR images reconstructed from axial data set. Oral contrast was administered.  COMPARISON:  12/14/2013  FINDINGS: Enlargement of cardiac chambers particularly LEFT atrium.  Scattered atherosclerotic calcifications aorta and coronary arteries.  Atelectasis RIGHT lower lobe.  BILATERAL renal cortical atrophy with small cyst and nonobstructing calculus at inferior pole RIGHT kidney.  Within limits of a nonenhanced exam, liver, spleen, pancreas, kidneys, and adrenal glands otherwise normal appearance.  Dependent calcification within gallbladder question tiny gallstone.  Stomach and small bowel loops grossly normal appearance for technique.  Focus of subcutaneous infiltration identified in the anterior LEFT pelvis at site of previously identified subcutaneous hematoma, decreased in size since previous study.  No new abdominal wall hemorrhage seen.  Sigmoid diverticulosis with wall thickening and minimal pericolic inflammatory changes at the distal sigmoid colon consistent with acute diverticulitis little changed  from previous exam.  Minimal free pelvic fluid, decreased.  Remainder of colon normal appearance.  Foley catheter decompresses urinary bladder.  No mass, adenopathy, free air, hernia, or acute osseous findings.  IMPRESSION: Decreased size of small subcutaneous hematoma in the anterior LEFT pelvis.  Minimal diverticulitis changes of the distal sigmoid colon, little changed from previous exam.  Question cholelithiasis.  Tiny nonobstructing RIGHT renal calculus.  Enlargement of cardiac chambers with extensive atherosclerotic disease and subsegmental atelectasis RIGHT lower lobe.   Electronically Signed   By: Ulyses Southward  M.D.   On: 01/21/2014 17:57   Dg Chest 2 View  01/20/2014   CLINICAL DATA:  Chest pain and shortness of breath.  EXAM: CHEST  2 VIEW  COMPARISON:  01/16/2014  FINDINGS: Enlargement of the cardiac silhouette is unchanged. Thoracic aorta is mildly tortuous. Thoracic aortic calcification is again seen. Eventration of the right hemidiaphragm is unchanged with associated right basilar atelectasis. The lungs are otherwise clear. There is no pleural effusion or pneumothorax. No acute osseous abnormality is seen.  IMPRESSION: No active cardiopulmonary disease.   Electronically Signed   By: Sebastian Ache   On: 01/20/2014 17:10   Micro Results: Recent Results (from the past 240 hour(s))  URINE CULTURE     Status: None   Collection Time    01/20/14  4:30 PM      Result Value Ref Range Status   Specimen Description URINE, CATHETERIZED   Final   Special Requests NONE   Final   Culture  Setup Time     Final   Value: 01/21/2014 00:33     Performed at [facility] Count     Final   Value: >=100,000 COLONIES/ML     Performed at [facility]   Culture     Final   Value: ESCHERICHIA COLI     Performed at [facility]   Report Status PENDING   Incomplete   Studies/Results: Ct Abdomen Pelvis Wo Contrast  01/21/2014   CLINICAL DATA:  Abdominal wall pain, on Coumadin, history hypertension, CHF, appendectomy, mitral valve prolapse, atrial fibrillation, MI  EXAM: CT ABDOMEN AND PELVIS WITHOUT CONTRAST  TECHNIQUE: Multidetector CT imaging of the abdomen and pelvis was performed following the standard protocol without IV contrast. Sagittal and coronal MPR images reconstructed from axial data set. Oral contrast was administered.  COMPARISON:  12/14/2013  FINDINGS: Enlargement of cardiac chambers particularly LEFT atrium.  Scattered atherosclerotic calcifications aorta and coronary arteries.  Atelectasis RIGHT lower lobe.  BILATERAL renal cortical atrophy with small cyst and  nonobstructing calculus at inferior pole RIGHT kidney.  Within limits of a nonenhanced exam, liver, spleen, pancreas, kidneys, and adrenal glands otherwise normal appearance.  Dependent calcification within gallbladder question tiny gallstone.  Stomach and small bowel loops grossly normal appearance for technique.  Focus of subcutaneous infiltration identified in the anterior LEFT pelvis at site of previously identified subcutaneous hematoma, decreased in size since previous study.  No new abdominal wall hemorrhage seen.  Sigmoid diverticulosis with wall thickening and minimal pericolic inflammatory changes at the distal sigmoid colon consistent with acute diverticulitis little changed from previous exam.  Minimal free pelvic fluid, decreased.  Remainder of colon normal appearance.  Foley catheter decompresses urinary bladder.  No mass, adenopathy, free air, hernia, or acute osseous findings.  IMPRESSION: Decreased size of small subcutaneous hematoma in the anterior LEFT pelvis.  Minimal diverticulitis changes of the distal sigmoid colon, little changed from previous exam.  Question  cholelithiasis.  Tiny nonobstructing RIGHT renal calculus.  Enlargement of cardiac chambers with extensive atherosclerotic disease and subsegmental atelectasis RIGHT lower lobe.   Electronically Signed   By: Ulyses Southward M.D.   On: 01/21/2014 17:57   Dg Chest 2 View  01/20/2014   CLINICAL DATA:  Chest pain and shortness of breath.  EXAM: CHEST  2 VIEW  COMPARISON:  01/16/2014  FINDINGS: Enlargement of the cardiac silhouette is unchanged. Thoracic aorta is mildly tortuous. Thoracic aortic calcification is again seen. Eventration of the right hemidiaphragm is unchanged with associated right basilar atelectasis. The lungs are otherwise clear. There is no pleural effusion or pneumothorax. No acute osseous abnormality is seen.  IMPRESSION: No active cardiopulmonary disease.   Electronically Signed   By: Sebastian Ache   On: 01/20/2014 17:10    Medications:  I have reviewed the patient's current medications Scheduled Meds: . atorvastatin  40 mg Oral q1800  . digoxin  0.125 mg Oral Daily  . febuxostat  40 mg Oral Daily  . furosemide  60 mg Intravenous BID  . gabapentin  300 mg Oral QHS  . hydroxychloroquine  200 mg Oral BID  . pantoprazole  40 mg Oral Daily  . potassium chloride  10 mEq Oral BID  . predniSONE  2 mg Oral Q breakfast  . sodium chloride  3 mL Intravenous Q12H  . sodium chloride  3 mL Intravenous Q12H  . Warfarin - Pharmacist Dosing Inpatient   Does not apply Q24H   Continuous Infusions:  PRN Meds:.sodium chloride, nitroGLYCERIN, ondansetron (ZOFRAN) IV, ondansetron, sodium chloride, traMADol   Assessment/Plan:  #1. Acute on chronic systolic heart failure. Repeat echocardiogram reveals an ejection fraction of 15-20% with diffuse hypokinesis. Lasix has been increased to 60 mg twice a day intravenously. Blood pressures have been low normal. #2. Atrial fibrillation. INR is 2.9. #3. Abdominal wall hematoma. Stable. #4. Escherichia coli UTI. Sensitivities pending. Start antibiotic therapy.  Active Problems:   CHF (congestive heart failure)     LOS: 2 days   Ngai Parcell 01/22/2014, 7:48 AM

## 2014-01-22 NOTE — Progress Notes (Signed)
Patient transferred via carelink. Patient was alert and oriented. VS within normal limits. Report called and given to Martorell, RN @ [address].

## 2014-01-22 NOTE — Progress Notes (Addendum)
Consulting cardiologist:Marissa, Christiane Ha [name] Primary Cardiologist: Marissa [name]  Subjective:   Generalized fatigue. No complaints of chest pain,.   Objective:   Temp:  [97.5 F (36.4 C)-98.3 F (36.8 C)] 97.5 F (36.4 C) (07/16 0558) Pulse Rate:  [49-53] 53 (07/16 0915) Resp:  [18-20] 20 (07/16 0558) BP: (89-110)/(54-68) 101/62 mmHg (07/16 0558) SpO2:  [94 %-100 %] 94 % (07/16 0558) Last BM Date: 01/20/14  Filed Weights   01/20/14 1554 01/20/14 2201  Weight: 175 lb (79.379 kg) 175 lb 4.3 oz (79.5 kg)    Intake/Output Summary (Last 24 hours) at 01/22/14 0918 Last data filed at 01/22/14 0916  Gross per 24 hour  Intake    363 ml  Output    800 ml  Net   -437 ml    Telemetry:Sinus brady 50's   Exam:  General: No acute distress. Fatigued.  HEENT: Conjunctiva and lids normal, oropharynx clear.  Lungs: Clear to auscultation, nonlabored.  Cardiac: No elevated JVP or bruits, distant heart sounds,  RRR, no gallop or rub.   Abdomen: Normoactive bowel sounds, nontender, nondistended.  Extremities: No pitting edema, distal pulses full.  Neuropsychiatric: Alert and oriented x3, affect appropriate.   Lab Results:  Basic Metabolic Panel:  Recent Labs Lab 01/16/14 0953 01/20/14 1615 01/21/14 0640  NA 146 146 147  K 4.0 4.1 4.3  CL 108 105 107  CO2 24 28 29   GLUCOSE 145* 121* 94  BUN 41* 40* 42*  CREATININE 2.16* 1.88* 2.00*  CALCIUM 9.6 10.3 10.0    Liver Function Tests:  Recent Labs Lab 01/20/14 1615  AST 25  ALT 19  ALKPHOS 93  BILITOT 0.5  PROT 7.0  ALBUMIN 3.6    CBC:  Recent Labs Lab 01/16/14 0953 01/20/14 1615 01/21/14 0640  WBC 6.4 8.4 7.1  HGB 11.7* 12.7 11.8*  HCT 36.4 40.1 37.0  MCV 91.9 92.0 91.6  PLT 163 212 190    Cardiac Enzymes:  Recent Labs Lab 01/16/14 0953 01/20/14 1615  TROPONINI <0.30 <0.30    BNP:  Recent Labs  08/21/13 1220 01/20/14 1615 01/21/14 0640  PROBNP 8457.0* 24961.0* 17548.0*     Coagulation:  Recent Labs Lab 01/20/14 1615 01/21/14 0640 01/22/14 0604  INR 3.32* 3.24* 2.90*    Radiology: Ct Abdomen Pelvis Wo Contrast  01/21/2014   CLINICAL DATA:  Abdominal wall pain, on Coumadin, history hypertension, CHF, appendectomy, mitral valve prolapse, atrial fibrillation, MI  EXAM: CT ABDOMEN AND PELVIS WITHOUT CONTRAST of the distal sigmoid colon, little changed from previous exam.  Question cholelithiasis.  Tiny nonobstructing RIGHT renal calculus.  Enlargement of cardiac chambers with extensive atherosclerotic disease and subsegmental atelectasis RIGHT lower lobe.   Electronically Signed   By: 01/23/2014 M.D.   On: 01/21/2014 17:57   Dg Chest 2 View  01/20/2014   CLINICAL DATA:  Chest pain and shortness of breath.  EXAM: CHEST  2 VIEW  COMPARISON:  01/16/2014  FINDINGS: Enlargement of the cardiac silhouette is unchanged. Thoracic aorta is mildly tortuous. Thoracic aortic calcification is again seen. Eventration of the right hemidiaphragm is unchanged with associated right basilar atelectasis. The lungs are otherwise clear. There is no pleural effusion or pneumothorax. No acute osseous abnormality is seen.  IMPRESSION: No active cardiopulmonary disease.   Electronically Signed   By: 03/19/2014   On: 01/20/2014 17:10    Echocardiogram 01/21/2014 Left ventricle: The cavity size was mildly dilated. Wall thickness was normal. Systolic function was severely reduced. LVEF 15-20%.  Systolic function appears fairly similar to prior study 08/22/13. Diffuse hypokinesis. Doppler parameters are consistent with restrictive physiology, indicative of decreased left ventricular diastolic compliance and/or increased left atrial pressure. Internal dimension, ED (PLAX chordal): 62 mm. - Aortic valve: Moderately calcified annulus. Trileaflet; moderately thickened leaflets. Probable mild aortic stenosis, lower gradient due to significantly decreased LVEF. Mean velocity (S): 99.07 cm/s.  Valve area (VTI): 1.6 cm^2. - Mitral valve: Mildly calcified annulus. Mildly thickened leaflets . There was moderate regurgitation. The MR is ventrally directed, and is likely functional MR related to LV dysfunction and hypokinesis. The MR vena contracta is 0.5 cm. - Left atrium: The atrium was massively dilated. - Right ventricle: The cavity size was mildly to moderately dilated. Systolic function was mildly reduced. RV TAPSE is 1.5 cm. - Right atrium: The atrium was massively dilated. - Tricuspid valve: There was moderate-severe regurgitation. The TR jet is eccentric and posterior directed, this may lead to underestimation of severity. Limited hepatic vein pulse wave Doppler, suggestion of possible systolic flow reversal suggestive of sever TR. - Pulmonary arteries: Systolic pressure was moderately increased. Severity may be underestimated in the setting of significant TR and RV dysfunction. PA peak pressure: 55 mm Hg (S). - Inferior vena cava: The vessel was dilated. The respirophasic diameter changes were blunted (< 50%), consistent with elevated central venous pressure. - Pericardium, extracardiac: There is a small circumferential pericardial effusion. - Technically adequate study.     Medications:   Scheduled Medications: . atorvastatin  40 mg Oral q1800  . ciprofloxacin  250 mg Oral BID  . digoxin  0.125 mg Oral Daily  . febuxostat  40 mg Oral Daily  . furosemide  60 mg Intravenous BID  . gabapentin  300 mg Oral QHS  . hydroxychloroquine  200 mg Oral BID  . losartan  25 mg Oral Daily  . pantoprazole  40 mg Oral Daily  . potassium chloride  10 mEq Oral BID  . predniSONE  2 mg Oral Q breakfast  . sodium chloride  3 mL Intravenous Q12H  . sodium chloride  3 mL Intravenous Q12H  . warfarin  2.5 mg Oral Once  . Warfarin - Pharmacist Dosing Inpatient   Does not apply Q24H   PRN Medications: sodium chloride, nitroGLYCERIN, ondansetron (ZOFRAN) IV, ondansetron, sodium  chloride, traMADol   Assessment and Plan:   1. Systolic Dysfunction: Worsening fx per echo completed this admission dropping to 15%-20% with symptoms of dyspnea, fluid retention and generalized fatigue. Renal fx is worsening as well, with creatinine increased to 2.0 from 1.88.   I have discussed treatment options as outlined by Dr. Wyline Mood on consult note. She may benefit from pacemaker/ICD to allow for more aggressive medical management.  Questions are answered, and I explained that it would require transfer to [facility] for EP consultation and beginning of further treatment strategy. They will discuss this and talk with Dr. Wyline Mood this am.  2. Atrial fib: Rate is controlled and bradycardic currently without rate control medications. She is continued on coumadin that may need to be stopped if pacemaker is planned. Tachybrady syndrome possible per Dr. Purvis Sheffield, with limited options for medications until she can be safely treated, with pacemaker support. This has been explained to the patient. She will decided whether to proceed after discussion with Dr. Wyline Mood. INR 2.9 this am.   3.Acute on Chronic Systolic CHF: Minimal urine output despite IV lasix. She may need inotropic support to assist with better diureses. Can be potentially arrhythmogenic with decreased  EF. Will await decisions.        Marissa [name]. Lawrence NP  01/22/2014, 9:18 AM  Patient seen and discussed with NP [name], I agree with her documentation above.   1. Acute on chronic systolic heart failure - LVEF 09/3352 40-45%, dropped to 20-25% in 08/2013. Repeat echo this admission shows stable LVEF 15-20%, restrictive diastolic dysfunction - cath July 2014 prior to LVEF drop with single small vessel CAD, unlikely diffuse severe disease would have developed this quickly, less likely ICM - there is concern for tachycardia induced CM given her afib, prior event monitor 08/2013 showed episodes of afib with RVR. Appropriate rate control has  been difficult due to intermittent bradycardia with noted pauses and junctional rhythms. She has clear tachy-brady on monitor this admission with rates in low 50s with intermittent tachycardia in the 130s - she has had progressive LV dysfunction by echo, and progressive heart failure symptoms along with frequent clinic,ER, and hospital visits with SOB.  - we will transfer her to [facility] for consideration of either pacemaker or defibrillator placement - no significant diuresis on current lasix dose, increase to 80mg  bid.  - digoxin stopped, beta blocker on hold due to bradycardia.  - concern for inability to appropriately treat her progressing heart failure with beta blocker therapy due to tachy-brady syndrome.   [name]

## 2014-01-22 NOTE — Progress Notes (Signed)
PT Cancellation Note  Patient Details Name: Marissa [name] MRN: [id number] DOB: 26-Jun-1933   Cancelled Treatment:    Reason Eval/Treat Not Completed: Patient declined, no reason specified  Family present and decline evaluation at this time, requesting family needs to talk.  Requesting PT to return at later time.  Will re-attempt evaluation as time allows.     [name] 01/22/2014, 9:50 AM

## 2014-01-22 NOTE — Progress Notes (Signed)
PT Cancellation Note  Patient Details Name: Marissa [name] MRN: [id number] DOB: [birth year]   Cancelled Treatment:    Reason Eval/Treat Not Completed: Patient declined, no reason specified  Re-attempted evaluation. Pt states she is being transferred to [facility] for pacemaker placement, declined evaluation at this time.  Educated pt to continue to sit up and move LE as able to prevent decline in mobility and strength.  Pt will be discharged from PT services at this time.  Will re-assess if new orders given.     Tobi Leinweber 01/22/2014, 11:11 AM

## 2014-01-23 ENCOUNTER — Encounter (HOSPITAL_COMMUNITY)
Admission: EM | Disposition: A | Discharge status: Skilled Nursing Facility | Payer: Self-pay | Source: Home / Self Care | Attending: Internal Medicine

## 2014-01-23 ENCOUNTER — Encounter: Payer: Medicare Other | Admitting: Adult Health

## 2014-01-23 DIAGNOSIS — N183 Chronic kidney disease, stage 3 unspecified: Secondary | ICD-10-CM

## 2014-01-23 DIAGNOSIS — N289 Disorder of kidney and ureter, unspecified: Secondary | ICD-10-CM

## 2014-01-23 DIAGNOSIS — I5043 Acute on chronic combined systolic (congestive) and diastolic (congestive) heart failure: Principal | ICD-10-CM

## 2014-01-23 DIAGNOSIS — I509 Heart failure, unspecified: Secondary | ICD-10-CM

## 2014-01-23 LAB — URINE CULTURE

## 2014-01-23 LAB — BASIC METABOLIC PANEL
Anion gap: 15 (ref 5–15)
BUN: 34 mg/dL — AB (ref 6–23)
CHLORIDE: 103 meq/L (ref 96–112)
CO2: 28 mEq/L (ref 19–32)
Calcium: 9.3 mg/dL (ref 8.4–10.5)
Creatinine, Ser: 1.74 mg/dL — ABNORMAL HIGH (ref 0.50–1.10)
GFR calc Af Amer: 31 mL/min — ABNORMAL LOW (ref >90)
GFR calc non Af Amer: 27 mL/min — ABNORMAL LOW (ref >90)
Glucose, Bld: 92 mg/dL (ref 70–99)
POTASSIUM: 3.8 meq/L (ref 3.7–5.3)
SODIUM: 146 meq/L (ref 137–147)

## 2014-01-23 LAB — PROTIME-INR
INR: 2.59 — ABNORMAL HIGH (ref 0.00–1.49)
PROTHROMBIN TIME: 27.8 s — AB (ref 11.6–15.2)

## 2014-01-23 SURGERY — PERMANENT PACEMAKER INSERTION
Anesthesia: LOCAL

## 2014-01-23 MED ORDER — WARFARIN - PHARMACIST DOSING INPATIENT
Freq: Every day | Status: DC
  Administered 2014-01-27 – 2014-01-28 (×2)

## 2014-01-23 MED ORDER — DEXTROSE 5 % IV SOLN
1.0000 g | Freq: Every day | INTRAVENOUS | Status: DC
  Administered 2014-01-23 – 2014-01-26 (×4): 1 g via INTRAVENOUS
  Filled 2014-01-23 (×5): qty 10

## 2014-01-23 MED ORDER — WARFARIN SODIUM 2 MG PO TABS
2.0000 mg | ORAL_TABLET | Freq: Once | ORAL | Status: AC
  Administered 2014-01-23: 2 mg via ORAL
  Filled 2014-01-23: qty 1

## 2014-01-23 MED ORDER — POLYETHYLENE GLYCOL 3350 17 G PO PACK
17.0000 g | PACK | Freq: Every day | ORAL | Status: DC | PRN
  Administered 2014-01-24: 17 g via ORAL
  Filled 2014-01-23 (×2): qty 1

## 2014-01-23 NOTE — Consult Note (Signed)
ELECTROPHYSIOLOGY CONSULT NOTE    Patient ID: Marissa [name] MRN: [id number], DOB/AGE: [birth year] 78 y.o.  Admit date: 01/20/2014 Date of Consult: 01-23-14  Primary Physician: Carylon Perches, MD Primary Cardiologist: Purvis Sheffield  Reason for Consultation: bradycardia  HPI:  Marissa [name] is a 80 y.o. female with a past medical history significant for CAD (NSTEMI 01-2013, medical therapy recommended), hypertension, longstanding mixed cardiomyopathy, renal insufficiency, atrial fibrillation, and hyperlipidemia.  She has had progressive decline over the last month with increasing shortness of breath and fatigue.  [facility] presented to [facility] for evaluation.  She was diuresed but further medical options were limited due to sinus bradycardia/afib with RVR and she was transferred to [facility] for evaluation of pacemaker implantation.   She reports a 25 year history of atrial fibrillation. She is not in atrial fibrillation all the time and thinks she feels worse when in sinus rhythm because of bradycardia.  She lives alone at home and is functionally limited by shortness of breath and fatigue.   Echo 01-21-14 demonstrated EF 15-20%, diffuse hypokinesis, LA 62, mild AS, moderate MR, moderate to severe TR, PA pressure 55.    Cardiac catheterization 01-26-13 demonstrated significant single vessel CAD with diffuse 95% stenosis in proximal OM3.  It was felt that risks of PCI outweighed benefits and medical therapy was recommended.   She had a fall about a month ago that occurred in the context of standing quickly.  She does not remember the event. She denies chest pain, lower extremity edema, recent fevers or chills, nausea or vomiting.  ROS is otherwise negative.   EP has been asked to evaluate for treatment options.   Past Medical History  Diagnosis Date  . Permanent atrial fibrillation     A.  Chronic Coumadin  . MVP (mitral valve prolapse)   . Essential hypertension, benign   . Nonischemic cardiomyopathy 2001   A.  07/23/11 - Echo: EF 35%;  B. 07/24/2011 - Cath: nonobs dzs ef 35%, 2+MR C. Echo 01/06/13: EF 40-45%, mild LVH, mlid MR, mod biatrial enlargment, mild RV dilatation, mod TR, PASP 54 mmHg  . Systolic CHF, chronic     A. Diag 07/2011, EF 35%  . Lumbar disc disease     Laminectomy 2001  . History of endometrial cancer   . Hyperlipidemia   . Rheumatoid arthritis     Treated in past with Remicade and developed vasculitis  . Gout   . PONV (postoperative nausea and vomiting)   . NSTEMI (non-ST elevated myocardial infarction) 12/2012  . GERD (gastroesophageal reflux disease)   . Coronary artery dissection 12/2012    95% OM3 hazy lesion on cath, medically managed  . Lumbar compression fracture 2014    L3, L4     Surgical History:  Past Surgical History  Procedure Laterality Date  . Appendectomy    . Abdominal hysterectomy  2000    Stage 1 endometrial cancer  . Hernia repair  09/2008  . Hernia repair  04/17/11    Lap ventral incisional hernia repair with mesh   . Gastric biopsy      Benign  . Excision of lipoma    . Lumbar laminectomy  2001  . Hammer toe surgery    . Cardiac catheterization  01/08/13    OM3 95% lesion felt to represent coronary dissection, mild luminal irregularities elsewhere; EF 40-45%; medically managed     Prescriptions prior to admission  Medication Sig Dispense Refill  . atorvastatin (LIPITOR) 40 MG tablet Take 40 mg  by mouth daily.      . Cholecalciferol (VITAMIN D) 2000 UNITS CAPS Take 2,000 Units by mouth daily.      . digoxin (LANOXIN) 0.125 MG tablet Take 1 tablet (0.125 mg total) by mouth daily.  30 tablet  12  . febuxostat (ULORIC) 40 MG tablet Take 40 mg by mouth daily.      . furosemide (LASIX) 40 MG tablet Take 1 tablet (40 mg total) by mouth 2 (two) times daily.  30 tablet  6  . gabapentin (NEURONTIN) 300 MG capsule Take 300 mg by mouth at bedtime.      . hydroxychloroquine (PLAQUENIL) 200 MG tablet Take 1 tablet by mouth Twice daily.      . Hypromellose  (GENTEAL OP) Place 1 drop into both eyes daily as needed. Tired Eyes      . isosorbide dinitrate (ISORDIL) 5 MG tablet Take 10 mg by mouth 2 (two) times daily.       Marland Kitchen losartan (COZAAR) 50 MG tablet Take 25 mg by mouth daily.      . metoprolol succinate (TOPROL-XL) 50 MG 24 hr tablet Take 50 mg by mouth daily.       Marland Kitchen omeprazole (PRILOSEC) 20 MG capsule Take 20 mg by mouth daily.      . potassium chloride (K-DUR,KLOR-CON) 10 MEQ tablet Take 10 mEq by mouth 2 (two) times daily.       . predniSONE (DELTASONE) 1 MG tablet Take 2 mg by mouth daily with breakfast.       . warfarin (COUMADIN) 5 MG tablet Take 2.5-5 mg by mouth daily. Take 1 tablet daily except 1/2 tablet on Mondays and Thursdays      . nitroGLYCERIN (NITROSTAT) 0.4 MG SL tablet Place 1 tablet (0.4 mg total) under the tongue every 5 (five) minutes as needed for chest pain.  25 tablet  12  . traMADol (ULTRAM) 50 MG tablet Take 1 tablet (50 mg total) by mouth every 6 (six) hours as needed for moderate pain.  30 tablet  5    Inpatient Medications:  . atorvastatin  40 mg Oral q1800  . ciprofloxacin  250 mg Oral BID  . febuxostat  40 mg Oral Daily  . furosemide  80 mg Intravenous BID  . gabapentin  300 mg Oral QHS  . hydroxychloroquine  200 mg Oral BID  . losartan  25 mg Oral Daily  . pantoprazole  40 mg Oral Daily  . potassium chloride  10 mEq Oral BID  . predniSONE  2 mg Oral Q breakfast  . sodium chloride  3 mL Intravenous Q12H  . sodium chloride  3 mL Intravenous Q12H    Allergies:  Allergies  Allergen Reactions  . Codeine Other (See Comments)    Makes patient sleepy.  Marland Kitchen Penicillins Rash    History   Social History  . Marital Status: Widowed    Spouse Name: N/A    Number of Children: N/A  . Years of Education: N/A   Occupational History  . Retired   .     Social History Main Topics  . Smoking status: Never Smoker   . Smokeless tobacco: Never Used  . Alcohol Use: Yes     Comment: Rare  . Drug Use: No  .  Sexual Activity: Not on file   Other Topics Concern  . Not on file   Social History Narrative  . No narrative on file     Family History  Problem Relation Age of  Onset  . Cancer Father     BP 104/64  Pulse 101  Temp(Src) 97.9 F (36.6 C) (Oral)  Resp 18  Ht 5' 5" (1.651 m)  Wt 173 lb 6.4 oz (78.654 kg)  BMI 28.86 kg/m2  SpO2 96%  Physical Exam: Physical Exam: Filed Vitals:   01/22/14 1410 01/22/14 2100 01/23/14 0151 01/23/14 0531  BP: 126/59 112/70 95/61 104/64  Pulse: 51 85 60 101  Temp: 97.4 F (36.3 C) 97.4 F (36.3 C) 97.9 F (36.6 C) 97.9 F (36.6 C)  TempSrc: Oral Oral Oral Oral  Resp: 18 20 20 18  Height: 5' 5" (1.651 m)     Weight: 175 lb 14.8 oz (79.8 kg)   173 lb 6.4 oz (78.654 kg)  SpO2: 100% 97% 96% 96%    GEN- The patient is elderly appearing, alert and oriented x 3 today.   Head- normocephalic, atraumatic Eyes-  Sclera clear, conjunctiva pink Ears- hearing intact Oropharynx- clear Neck- supple, + JVD Lungs- Clear to ausculation bilaterally, normal work of breathing Heart- irregular rate and rhythm, 2/6 SEM LSB GI- soft, NT, ND, + BS Extremities- no clubbing, cyanosis, + dependant edema  MS- diffuse muscle atrophy Skin- no rash or lesion Psych- euthymic mood, full affect Neuro- strength and sensation are intact  Labs:   Lab Results  Component Value Date   WBC 7.1 01/21/2014   HGB 11.8* 01/21/2014   HCT 37.0 01/21/2014   MCV 91.6 01/21/2014   PLT 190 01/21/2014    Recent Labs Lab 01/20/14 1615 01/21/14 0640  NA 146 147  K 4.1 4.3  CL 105 107  CO2 28 29  BUN 40* 42*  CREATININE 1.88* 2.00*  CALCIUM 10.3 10.0  PROT 7.0  --   BILITOT 0.5  --   ALKPHOS 93  --   ALT 19  --   AST 25  --   GLUCOSE 121* 94    Radiology/Studies: Ct Abdomen Pelvis Wo Contrast 01/21/2014   CLINICAL DATA:  Abdominal wall pain, on Coumadin, history hypertension, CHF, appendectomy, mitral valve prolapse, atrial fibrillation, MI  EXAM: CT ABDOMEN AND PELVIS  WITHOUT CONTRAST  TECHNIQUE: Multidetector CT imaging of the abdomen and pelvis was performed following the standard protocol without IV contrast. Sagittal and coronal MPR images reconstructed from axial data set. Oral contrast was administered.  COMPARISON:  12/14/2013  FINDINGS: Enlargement of cardiac chambers particularly LEFT atrium.  Scattered atherosclerotic calcifications aorta and coronary arteries.  Atelectasis RIGHT lower lobe.  BILATERAL renal cortical atrophy with small cyst and nonobstructing calculus at inferior pole RIGHT kidney.  Within limits of a nonenhanced exam, liver, spleen, pancreas, kidneys, and adrenal glands otherwise normal appearance.  Dependent calcification within gallbladder question tiny gallstone.  Stomach and small bowel loops grossly normal appearance for technique.  Focus of subcutaneous infiltration identified in the anterior LEFT pelvis at site of previously identified subcutaneous hematoma, decreased in size since previous study.  No new abdominal wall hemorrhage seen.  Sigmoid diverticulosis with wall thickening and minimal pericolic inflammatory changes at the distal sigmoid colon consistent with acute diverticulitis little changed from previous exam.  Minimal free pelvic fluid, decreased.  Remainder of colon normal appearance.  Foley catheter decompresses urinary bladder.  No mass, adenopathy, free air, hernia, or acute osseous findings.  IMPRESSION: Decreased size of small subcutaneous hematoma in the anterior LEFT pelvis.  Minimal diverticulitis changes of the distal sigmoid colon, little changed from previous exam.  Question cholelithiasis.  Tiny nonobstructing RIGHT renal calculus.    Enlargement of cardiac chambers with extensive atherosclerotic disease and subsegmental atelectasis RIGHT lower lobe.   Electronically Signed   By: Mark  Boles M.D.   On: 01/21/2014 17:57   Dg Chest 2 View 01/20/2014   CLINICAL DATA:  Chest pain and shortness of breath.  EXAM: CHEST  2 VIEW   COMPARISON:  01/16/2014  FINDINGS: Enlargement of the cardiac silhouette is unchanged. Thoracic aorta is mildly tortuous. Thoracic aortic calcification is again seen. Eventration of the right hemidiaphragm is unchanged with associated right basilar atelectasis. The lungs are otherwise clear. There is no pleural effusion or pneumothorax. No acute osseous abnormality is seen.  IMPRESSION: No active cardiopulmonary disease.   Electronically Signed   By: Allen  Grady   On: 01/20/2014 17:10  ease.  Stable compared to prior exam.   Electronically Signed   By: Thomas  Dalessio M.D.   On: 01/12/2014 11:48   EKG: sinus brady, rate 51, normal intervals, QRS 102  TELEMETRY: sinus brady, intermittent afib with RVR, PVC's  A/P 1. Tachycardia/ bradycardia The patient has symptomatic paroxysmal atrial fibrillation.  She has elevated V rates during afib but also frequent sinus bradycardia with SOB and fatigue.  Though I would anticipate that the patient would benefit from pacing, I would like to first have her seen by the advanced heart failure team.  She appears to have severe valvular heart disease which I think should be further assessed at this time.  2. Atrial fibrillation She has severe LA enlargement as well as significant valvular disease.  I think that our ability to maintain sinus rhythm long term is low.  She has failed medical therapy amiodarone.  She is a poor candidate for tikosyn with renal failure.  She is also not a candidate for ablation. I would recommend rate control long term.  3. Valvular heart disease, acute on chronic combined systolic and diastolic CHF I am concerned that her overall prognosis is quite poor.  I would recommend evaluation at this time by the advanced CHF team.  She has a narrow QRS and therefore is not a candidate for CRT.  I would be reluctant to consider ICD in this patient given her overall prognosis.  I think that pacing backup for tachy/brady may be a more suitable  option for this patient.  I will defer this decision until after she is seen by the CHF team.  

## 2014-01-23 NOTE — Progress Notes (Signed)
ANTICOAGULATION CONSULT NOTE - Initial Consult  Pharmacy Consult for warfarin  Indication: atrial fibrillation  Allergies  Allergen Reactions  . Codeine Other (See Comments)    Makes patient sleepy.  Marland Kitchen Penicillins Rash    Patient Measurements: Height: 5\' 5"  (165.1 cm) Weight: 173 lb 6.4 oz (78.654 kg) (scale c) IBW/kg (Calculated) : 57   Vital Signs: Temp: 97.9 F (36.6 C) (07/17 0531) Temp src: Oral (07/17 0531) BP: 104/64 mmHg (07/17 0531) Pulse Rate: 101 (07/17 0531)  Labs:  Recent Labs  01/20/14 1615 01/21/14 0640 01/22/14 0604 01/23/14 0448 01/23/14 1144  HGB 12.7 11.8*  --   --   --   HCT 40.1 37.0  --   --   --   PLT 212 190  --   --   --   LABPROT 33.7* 33.1* 30.3* 27.8*  --   INR 3.32* 3.24* 2.90* 2.59*  --   CREATININE 1.88* 2.00*  --   --  1.74*  TROPONINI <0.30  --   --   --   --     Estimated Creatinine Clearance: 27.6 ml/min (by C-G formula based on Cr of 1.74).   Medical History: Past Medical History  Diagnosis Date  . Permanent atrial fibrillation     A.  Chronic Coumadin  . MVP (mitral valve prolapse)   . Essential hypertension, benign   . Nonischemic cardiomyopathy 2001    A.  07/23/11 - Echo: EF 35%;  B. 07/24/2011 - Cath: nonobs dzs ef 35%, 2+MR C. Echo 01/06/13: EF 40-45%, mild LVH, mlid MR, mod biatrial enlargment, mild RV dilatation, mod TR, PASP 54 mmHg  . Systolic CHF, chronic     A. Diag 07/2011, EF 35%  . Lumbar disc disease     Laminectomy 2001  . History of endometrial cancer   . Hyperlipidemia   . Rheumatoid arthritis     Treated in past with Remicade and developed vasculitis  . Gout   . PONV (postoperative nausea and vomiting)   . NSTEMI (non-ST elevated myocardial infarction) 12/2012  . GERD (gastroesophageal reflux disease)   . Coronary artery dissection 12/2012    95% OM3 hazy lesion on cath, medically managed  . Lumbar compression fracture 2014    L3, L4    Medications:  Warfarin pta 5mg  daily except 2.5mg  on  Monday/thursday  Assessment: 80 year old female with hx of afib on chronic coumadin. INR therapeutic at 2.59. D/w EP this afternoon, they are ok with continuing low dose coumadin and would like to keep her INR <2.5 for possible pacer on Monday. Will give low dose tonight and re-evaluate in am.  Ecoli resistant to FQ's - will change to ceftriaxone, patient has tolerated cephalexin without issues.  Goal of Therapy:  INR goal 20-2.5 Monitor platelets by anticoagulation protocol: Yes   Plan:  Warfarin 2mg  tonight Continue low dose over weekend to keep INR down  70 PharmD., BCPS Clinical Pharmacist Pager [phone] 01/23/2014 1:48 PM

## 2014-01-23 NOTE — Progress Notes (Signed)
Heart Failure Navigator Consult Note  Presentation: Marissa [name] is a 80 y.o. female with a past medical history significant for CAD (NSTEMI 01-2013, medical therapy recommended), hypertension, a fib in the past on chronic coumadin, rhuematoid arthritis on chronic prednisone, chronic back pain, longstanding mixed cardiomyopathy, renal insufficiency, atrial fibrillation, and hyperlipidemia. Cardiac catheterization 01-26-13 demonstrated significant single vessel CAD with diffuse 95% stenosis in proximal OM3. She had VT arrest after cath thought to be from ischemia. It was felt that risks of PCI outweighed benefits and medical therapy was recommended. Had event montior 08/2013 with episodes of A fib RVR. Concern for tachy-mediated cardiomyopathy.  2 weeks ago stopped forteo started and later stopped due to possible dyspnea.  HF medications limited due pauses and bradycardia. She was on toprol and digoxin in the past. Prior to admit she was on lasix 40 mg bid. Admitted to [facility] with increased dyspnea and lower extremity edema. On admit HR in 50s with intermittent tachycardia 130s. Transferred to [facility] for EP consult and consideration pacemaker versus defibrillator. She has been diuresing with 80 mg IV lasix. Weight down 2 pounds.   Past Medical History  Diagnosis Date  . Permanent atrial fibrillation     A.  Chronic Coumadin  . MVP (mitral valve prolapse)   . Essential hypertension, benign   . Nonischemic cardiomyopathy 2001    A.  07/23/11 - Echo: EF 35%;  B. 07/24/2011 - Cath: nonobs dzs ef 35%, 2+MR C. Echo 01/06/13: EF 40-45%, mild LVH, mlid MR, mod biatrial enlargment, mild RV dilatation, mod TR, PASP 54 mmHg  . Systolic CHF, chronic     A. Diag 07/2011, EF 35%  . Lumbar disc disease     Laminectomy 2001  . History of endometrial cancer   . Hyperlipidemia   . Rheumatoid arthritis     Treated in past with Remicade and developed vasculitis  . Gout   . PONV (postoperative nausea and vomiting)   . NSTEMI  (non-ST elevated myocardial infarction) 12/2012  . GERD (gastroesophageal reflux disease)   . Coronary artery dissection 12/2012    95% OM3 hazy lesion on cath, medically managed  . Lumbar compression fracture 2014    L3, L4    History   Social History  . Marital Status: Widowed    Spouse Name: N/A    Number of Children: N/A  . Years of Education: N/A   Occupational History  . Retired   .     Social History Main Topics  . Smoking status: Never Smoker   . Smokeless tobacco: Never Used  . Alcohol Use: Yes     Comment: Rare  . Drug Use: No  . Sexual Activity: None   Other Topics Concern  . None   Social History Narrative  . None    ECHO:Study Conclusions--01/21/14  - Left ventricle: The cavity size was mildly dilated. Wall thickness was normal. Systolic function was severely reduced. LVEF 15-20%. Systolic function appears fairly similar to prior study 08/22/13. Diffuse hypokinesis. Doppler parameters are consistent with restrictive physiology, indicative of decreased left ventricular diastolic compliance and/or increased left atrial pressure. Internal dimension, ED (PLAX chordal): 62 mm. - Aortic valve: Moderately calcified annulus. Trileaflet; moderately thickened leaflets. Probable mild aortic stenosis, lower gradient due to significantly decreased LVEF. Mean velocity (S): 99.07 cm/s. Valve area (VTI): 1.6 cm^2. - Mitral valve: Mildly calcified annulus. Mildly thickened leaflets . There was moderate regurgitation. The MR is ventrally directed, and is likely functional MR related to LV  dysfunction and hypokinesis. The MR vena contracta is 0.5 cm. - Left atrium: The atrium was massively dilated. - Right ventricle: The cavity size was mildly to moderately dilated. Systolic function was mildly reduced. RV TAPSE is 1.5 cm. - Right atrium: The atrium was massively dilated. - Tricuspid valve: There was moderate-severe regurgitation. The TR jet is eccentric and posterior  directed, this may lead to underestimation of severity. Limited hepatic vein pulse wave Doppler, suggestion of possible systolic flow reversal suggestive of sever TR. - Pulmonary arteries: Systolic pressure was moderately increased. Severity may be underestimated in the setting of significant TR and RV dysfunction. PA peak pressure: 55 mm Hg (S). - Inferior vena cava: The vessel was dilated. The respirophasic diameter changes were blunted (< 50%), consistent with elevated central venous pressure. - Pericardium, extracardiac: There is a small circumferential pericardial effusion. - Technically adequate study.  Transthoracic echocardiography. M-mode, complete 2D, spectral Doppler, and color Doppler. Birthdate: Patient birthdate: 1933/01/19. Age: Patient is 80 yr old. Sex: Gender: female. Height: Height: 165.1 cm. Height: 65 in. Weight: Weight: 79.4 kg. Weight: 174.6 lb. Body mass index: BMI: 29.1 kg/m^2. Body surface area: BSA: 1.93 m^2. Blood pressure: 103/59 Patient status: Inpatient. Study date: Study date: 01/21/2014. Study time: 03:53 PM. Location: Bedside.  BNP    Component Value Date/Time   PROBNP 17548.0* 01/21/2014 0640    Education Assessment and Provision:  Detailed education and instructions provided on heart failure disease management including the following:  Signs and symptoms of Heart Failure When to call the physician Importance of daily weights Low sodium diet Fluid restriction Medication management.   Anticipated future follow-up appointments  Patient education given on each of the above topics.  Patient acknowledges understanding and acceptance of all instructions.  I briefly spoke with Marissa [name] regarding her HF diagnosis.  She has had HF for "some time" and received much of this education before.  We reviewed all topics described above and is able to teach back.  Before admission she was weighing daily.  She says that she tries to stick to a low sodium  diet.  Her caregiver does the grocery shopping and most cooking.  I will plan to come back before discharge to review information and assist with follow-up plan.  Education Materials:  "Living Better With Heart Failure" Booklet, Daily Weight Tracker Tool .   High Risk Criteria for Readmission and/or Poor Patient Outcomes:  (Recommend Follow-up with Advanced Heart Failure Clinic)--Yes    EF <30%- Yes-15-20%  2 or more admissions in 6 months- Yes  Difficult social situation-No  Demonstrates medication noncompliance- No   Barriers of Care:  Knowledge and ability to be compliant.  Discharge Planning:   Current plan is home with caregiver 6 hours per day.

## 2014-01-23 NOTE — Progress Notes (Signed)
Patient slept well throughout the night.  No PRN's given.  Patient is stable.  Will give report to oncoming nurse.

## 2014-01-23 NOTE — Progress Notes (Signed)
[facility]  Patient Status: Active (receiving services up to time of hospitalization)  [facility] is providing the following services: RN and PT  If patient discharges after hours, please call [phone].   Marissa [name] 01/23/2014, 2:09 PM

## 2014-01-23 NOTE — Progress Notes (Signed)
Came to visit patient at bedside. She is active with [facility] Care Management services. Patient has been followed closely by the [facility] for disease and medication management. Patient endorses she is also active with [facility] for PT/RN services. She lives alone and has private duty care as well. She does have a daughter that lives nearby. Will make inpatient RNCM aware [facility] following and will continue to follow.  Raiford Noble, MSN-RN,BSN- [facility] [phone]

## 2014-01-23 NOTE — Progress Notes (Signed)
UR completed Robi Dewolfe K. Klark Vanderhoef, RN, BSN, MSHL, CCM  01/23/2014 11:10 AM

## 2014-01-23 NOTE — Progress Notes (Signed)
Advanced Heart Failure Rounding Note   Subjective:   PCP: Dr Roger Shelter Primary Cardiologist: Dr Theodoro Clock Marissa [name] is a 80 y.o. female with a past medical history significant for CAD (NSTEMI 01-2013, medical therapy recommended), hypertension, a fib in the past on chronic coumadin, rhuematoid arthritis on chronic prednisone, chronic back pain,  longstanding mixed cardiomyopathy, renal insufficiency, atrial fibrillation, and hyperlipidemia. Cardiac catheterization 01-26-13 demonstrated significant single vessel CAD with diffuse 95% stenosis in proximal OM3. She had VT arrest after cath thought to be from ischemia. It was felt that risks of PCI outweighed benefits and medical therapy was recommended. Had event montior 08/2013 with episodes of A fib RVR. Concern for tachy-mediated cardiomyopathy.  2 weeks ago stopped forteo started and later stopped due to possible dyspnea.   HF medications limited due pauses and bradycardia. She was on toprol and digoxin in the past. Prior to admit she was on lasix 40 mg bid. Admitted to [facility] with increased dyspnea and lower extremity edema. On admit  HR in 50s with intermittent tachycardia 130s. Transferred to [facility] for EP consult and consideration pacemaker versus defibrillator. She has been diuresis ing with 80 mg IV lasix. Weight down 2 pounds.   SOB with exeriton.   ECHO 01/21/14 EF 15-20% LA massively dilated, diffusely  Hypokinetic and RV mild-mod dilated.   Creatinine 1.8 >2.0  Pro BNP 17548    Objective:   Weight Range:  Vital Signs:   Temp:  [97.4 F (36.3 C)-97.9 F (36.6 C)] 97.9 F (36.6 C) (07/17 0531) Pulse Rate:  [51-101] 101 (07/17 0531) Resp:  [18-20] 18 (07/17 0531) BP: (95-126)/(59-70) 104/64 mmHg (07/17 0531) SpO2:  [96 %-100 %] 96 % (07/17 0531) Weight:  [173 lb 6.4 oz (78.654 kg)-175 lb 14.8 oz (79.8 kg)] 173 lb 6.4 oz (78.654 kg) (07/17 0531) Last BM Date: 01/20/14  Weight change: Filed Weights   01/20/14 2201 01/22/14 1410  01/23/14 0531  Weight: 175 lb 4.3 oz (79.5 kg) 175 lb 14.8 oz (79.8 kg) 173 lb 6.4 oz (78.654 kg)    Intake/Output:   Intake/Output Summary (Last 24 hours) at 01/23/14 1014 Last data filed at 01/23/14 3149  Gross per 24 hour  Intake    320 ml  Output   1775 ml  Net  -1455 ml     Physical Exam: General:  Elderly appearing. No resp difficulty HEENT: normal Neck: supple. JVP to jaw. Carotids 2+ bilat; no bruits. No lymphadenopathy or thryomegaly appreciated. Cor: PMI nondisplaced. Irregular rate & rhythm. No rubs, gallops or 2/6 HSM LLSB. Lungs: clear Abdomen: soft, nontender, + distended. No hepatosplenomegaly. No bruits or masses. Good bowel sounds. Extremities: no cyanosis, clubbing, rash, R and LLE + 3edema Neuro: alert & orientedx3, cranial nerves grossly intact. moves all 4 extremities w/o difficulty. Affect pleasant  Telemetry: A fib 100s   Labs: Basic Metabolic Panel:  Recent Labs Lab 01/20/14 1615 01/21/14 0640  NA 146 147  K 4.1 4.3  CL 105 107  CO2 28 29  GLUCOSE 121* 94  BUN 40* 42*  CREATININE 1.88* 2.00*  CALCIUM 10.3 10.0    Liver Function Tests:  Recent Labs Lab 01/20/14 1615  AST 25  ALT 19  ALKPHOS 93  BILITOT 0.5  PROT 7.0  ALBUMIN 3.6   No results found for this basename: LIPASE, AMYLASE,  in the last 168 hours No results found for this basename: AMMONIA,  in the last 168 hours  CBC:  Recent Labs Lab 01/20/14 1615 01/21/14  0640  WBC 8.4 7.1  NEUTROABS 6.2  --   HGB 12.7 11.8*  HCT 40.1 37.0  MCV 92.0 91.6  PLT 212 190    Cardiac Enzymes:  Recent Labs Lab 01/20/14 1615  TROPONINI <0.30    BNP: BNP (last 3 results)  Recent Labs  08/21/13 1220 01/20/14 1615 01/21/14 0640  PROBNP 8457.0* 24961.0* 17548.0*     Other results:  EKG:   Imaging: Ct Abdomen Pelvis Wo Contrast  01/21/2014   CLINICAL DATA:  Abdominal wall pain, on Coumadin, history hypertension, CHF, appendectomy, mitral valve prolapse, atrial  fibrillation, MI  EXAM: CT ABDOMEN AND PELVIS WITHOUT CONTRAST  TECHNIQUE: Multidetector CT imaging of the abdomen and pelvis was performed following the standard protocol without IV contrast. Sagittal and coronal MPR images reconstructed from axial data set. Oral contrast was administered.  COMPARISON:  12/14/2013  FINDINGS: Enlargement of cardiac chambers particularly LEFT atrium.  Scattered atherosclerotic calcifications aorta and coronary arteries.  Atelectasis RIGHT lower lobe.  BILATERAL renal cortical atrophy with small cyst and nonobstructing calculus at inferior pole RIGHT kidney.  Within limits of a nonenhanced exam, liver, spleen, pancreas, kidneys, and adrenal glands otherwise normal appearance.  Dependent calcification within gallbladder question tiny gallstone.  Stomach and small bowel loops grossly normal appearance for technique.  Focus of subcutaneous infiltration identified in the anterior LEFT pelvis at site of previously identified subcutaneous hematoma, decreased in size since previous study.  No new abdominal wall hemorrhage seen.  Sigmoid diverticulosis with wall thickening and minimal pericolic inflammatory changes at the distal sigmoid colon consistent with acute diverticulitis little changed from previous exam.  Minimal free pelvic fluid, decreased.  Remainder of colon normal appearance.  Foley catheter decompresses urinary bladder.  No mass, adenopathy, free air, hernia, or acute osseous findings.  IMPRESSION: Decreased size of small subcutaneous hematoma in the anterior LEFT pelvis.  Minimal diverticulitis changes of the distal sigmoid colon, little changed from previous exam.  Question cholelithiasis.  Tiny nonobstructing RIGHT renal calculus.  Enlargement of cardiac chambers with extensive atherosclerotic disease and subsegmental atelectasis RIGHT lower lobe.   Electronically Signed   By: Ulyses Southward M.D.   On: 01/21/2014 17:57      Medications:     Scheduled Medications: .  atorvastatin  40 mg Oral q1800  . ciprofloxacin  250 mg Oral BID  . febuxostat  40 mg Oral Daily  . furosemide  80 mg Intravenous BID  . gabapentin  300 mg Oral QHS  . hydroxychloroquine  200 mg Oral BID  . losartan  25 mg Oral Daily  . pantoprazole  40 mg Oral Daily  . potassium chloride  10 mEq Oral BID  . predniSONE  2 mg Oral Q breakfast  . sodium chloride  3 mL Intravenous Q12H  . sodium chloride  3 mL Intravenous Q12H     Infusions:     PRN Medications:  sodium chloride, nitroGLYCERIN, ondansetron (ZOFRAN) IV, ondansetron, sodium chloride, traMADol   Assessment/Plan    1. A/C Systolic Heart Failure: ECHO 01/2014 EF 15-20% which is down from June 2014 (was previously 40-45%). Possible tachy-mediated CMP but has history of CAD and prior MI.  Has not had ischemic workup.  Volume status elevated. Continue IV lasix 80 mg bid. No beta blocker of digoxin due to bradycardia. Creatinine up from 1.8>2.0. Check BMET now. Stop losartan for now. BP ok   2. A-fib-intermittent RVR . Coumadin held for ?PCM placement it appears. No bb due to intermittent bradycardia  3. Bradycardia. EP following for possible pace maker once volume status improves.  Coumadin per pharmacy 4. UTI- Ecoli- Stop cipro change to rochepin 5. CAD-  01/2013 significant single vessel CAD with diffuse 95% stenosis in proximal OM3. Medically treated. No evidence of ischemia.  Continue atorvastatin.  6.  CKD Stage III-  Creatinine 1.8>2.0> pending  7. Immobility - Consult PT 8. Rheumatoid Arthritis- on chronic prednisone and hydroxycholorquine 9. Osteoporosis intolerant forteo   Length of Stay: 3  CLEGG,AMY  NP-C   01/23/2014, 10:14 AM  Advanced Heart Failure Team Pager [phone] (M-F; 7a - 4p)  Please contact  Cardiology for night-coverage after hours (4p -7a ) and weekends on [email or web address]  Patient seen with NP, agree with the above note.  1. Acute on chronic systolic CHF: EF 25-05%, down from 40-45%.   Possibly tachy-mediated cardiomyopathy, but has history of CAD and MI, and ischemia has not been ruled out.  She is volume overloaded on exam.  Baseline CKD and possible cardiorenal syndrome may make diuresis difficult.  - Continue Lasix 80 mg IV bid.  She diuresed yesterday reasonably on this regimen.  Will need to follow creatinine closely.  - Stop ARB for now with elevated creatinine.  - Would consider RHC next week.   - Would suggest ischemic workup: Avoid LHC for now with elevated creatinine.  Will get [employer], would consider angiography only for high risk study.  2. Atrial fibrillation with RVR alternating with sinus brady and pauses: Tachy-brady syndrome.  She has been taken off mteoprolol and digoxin apparently due to pauses and bradycardia.  Here, she has been mainly atrial fibrillation in the 100s-110s but sometimes appears to be in NSR in the 60s.  She will need either rate control or ablate/pace as she has failed amiodarone and is not a Tikosyn candidate with poor renal fxn.  She would be unlikely to maintain NSR with dilated atria and MR/TR.  It appears that she will need backup pacing in order to manage her RVR and ?tachy-mediated CMP.  I might consider AVN ablation with BiV pacing, especially if she will be pacing her RV a lot.  I agree that ICD is probably not a good idea with overall prognosis.  3. Valvular heart disease: Moderate MR, moderate-severe TR.   Marca Ancona 01/23/2014 1:41 PM

## 2014-01-24 ENCOUNTER — Inpatient Hospital Stay (HOSPITAL_COMMUNITY): Payer: Medicare Other

## 2014-01-24 DIAGNOSIS — R079 Chest pain, unspecified: Secondary | ICD-10-CM

## 2014-01-24 LAB — BASIC METABOLIC PANEL
ANION GAP: 13 (ref 5–15)
BUN: 29 mg/dL — ABNORMAL HIGH (ref 6–23)
CALCIUM: 8.9 mg/dL (ref 8.4–10.5)
CO2: 31 mEq/L (ref 19–32)
Chloride: 104 mEq/L (ref 96–112)
Creatinine, Ser: 1.57 mg/dL — ABNORMAL HIGH (ref 0.50–1.10)
GFR calc Af Amer: 35 mL/min — ABNORMAL LOW (ref >90)
GFR calc non Af Amer: 30 mL/min — ABNORMAL LOW (ref >90)
Glucose, Bld: 96 mg/dL (ref 70–99)
Potassium: 3.5 mEq/L — ABNORMAL LOW (ref 3.7–5.3)
SODIUM: 148 meq/L — AB (ref 137–147)

## 2014-01-24 LAB — PROTIME-INR
INR: 2 — ABNORMAL HIGH (ref 0.00–1.49)
PROTHROMBIN TIME: 22.7 s — AB (ref 11.6–15.2)

## 2014-01-24 MED ORDER — REGADENOSON 0.4 MG/5ML IV SOLN
INTRAVENOUS | Status: AC
  Filled 2014-01-24: qty 5

## 2014-01-24 MED ORDER — REGADENOSON 0.4 MG/5ML IV SOLN
0.4000 mg | Freq: Once | INTRAVENOUS | Status: AC
  Administered 2014-01-24: 0.4 mg via INTRAVENOUS
  Filled 2014-01-24: qty 5

## 2014-01-24 MED ORDER — WARFARIN SODIUM 4 MG PO TABS
4.0000 mg | ORAL_TABLET | Freq: Once | ORAL | Status: AC
  Administered 2014-01-24: 4 mg via ORAL
  Filled 2014-01-24: qty 1

## 2014-01-24 MED ORDER — METOPROLOL TARTRATE 1 MG/ML IV SOLN
2.5000 mg | INTRAVENOUS | Status: DC | PRN
  Administered 2014-01-24: 2.5 mg via INTRAVENOUS
  Filled 2014-01-24: qty 5

## 2014-01-24 MED ORDER — TECHNETIUM TC 99M SESTAMIBI GENERIC - CARDIOLITE
10.0000 | Freq: Once | INTRAVENOUS | Status: AC | PRN
  Administered 2014-01-24: 10 via INTRAVENOUS

## 2014-01-24 MED ORDER — TECHNETIUM TC 99M SESTAMIBI GENERIC - CARDIOLITE
30.0000 | Freq: Once | INTRAVENOUS | Status: AC | PRN
  Administered 2014-01-24: 30 via INTRAVENOUS

## 2014-01-24 NOTE — Progress Notes (Addendum)
TED stockings measured and ordered.  Will apply when available.  PRN metoprolol ordered by cardiology PA on call for HR sustaining in 140s.  Medication administered (see MAR) to good effect, heart rate sustaining in 102-115 range.  Patient denies any questions or concerns at this time.  Will continue to monitor.

## 2014-01-24 NOTE — Progress Notes (Signed)
ANTICOAGULATION CONSULT NOTE - FOLLOW UP  Pharmacy Consult:  Coumadin Indication: atrial fibrillation  Allergies  Allergen Reactions  . Codeine Other (See Comments)    Makes patient sleepy.  Marland Kitchen Penicillins Rash    Patient Measurements: Height: 5\' 5"  (165.1 cm) Weight: 170 lb 10.2 oz (77.4 kg) IBW/kg (Calculated) : 57  Vital Signs: Temp: 98.8 F (37.1 C) (07/18 0503) Temp src: Oral (07/18 0503) BP: 112/66 mmHg (07/18 0503) Pulse Rate: 86 (07/18 0503)  Labs:  Recent Labs  01/22/14 0604 01/23/14 0448 01/23/14 1144 01/24/14 0405  LABPROT 30.3* 27.8*  --  22.7*  INR 2.90* 2.59*  --  2.00*  CREATININE  --   --  1.74* 1.57*    Estimated Creatinine Clearance: 30.4 ml/min (by C-G formula based on Cr of 1.57).     Assessment: 80 year old female with history of Afib on chronic Coumadin.  EP wants to continue low dose Coumadin and keep her INR < 2.5 for possible pacer on Monday.  INR therapeutic but is toward the low end of desired range.  Concern INR will decrease further since Coumadin was held x 3 doses and the dose was decreased yesterday. No bleeding reported.  Home dose:  5mg  daily except 2.5mg  on Mon / Thurs   Goal of Therapy:  INR goal 2-2.5 until decision for pacer is established   Plan:  - Coumadin 4mg  PO today - Daily PT / INR - Consider additional KCL supplementation    Whyatt Klinger D. Tue, PharmD, BCPS Pager:  [phone] 01/24/2014, 10:59 AM

## 2014-01-24 NOTE — Progress Notes (Addendum)
Patient alert and oriented x4.  Experiencing chest pressure, rated 6/10 in mid, medial chest.  Vital signs stable.  EKG obtained and in chart.  PRN nitro administered x2, patient rating pain 2/10, which patient states acceptable level.  MD notified, no additional orders placed at this time.  Will continue to monitor.

## 2014-01-24 NOTE — Progress Notes (Signed)
Patient ID: STACYE NOORI, female   DOB: 1932-09-27, 79 y.o.   MRN: [id number]   Patient Name: Marissa [name] Date of Encounter: 01/24/2014     Active Problems:   CHF (congestive heart failure)    SUBJECTIVE  No chest pain. Sob is improved.  CURRENT MEDS . atorvastatin  40 mg Oral q1800  . cefTRIAXone (ROCEPHIN)  IV  1 g Intravenous Daily  . febuxostat  40 mg Oral Daily  . furosemide  80 mg Intravenous BID  . gabapentin  300 mg Oral QHS  . hydroxychloroquine  200 mg Oral BID  . pantoprazole  40 mg Oral Daily  . potassium chloride  10 mEq Oral BID  . predniSONE  2 mg Oral Q breakfast  . sodium chloride  3 mL Intravenous Q12H  . sodium chloride  3 mL Intravenous Q12H  . Warfarin - Pharmacist Dosing Inpatient   Does not apply q1800    OBJECTIVE  Filed Vitals:   01/23/14 0531 01/23/14 1422 01/23/14 2046 01/24/14 0503  BP: 104/64 103/50 120/65 112/66  Pulse: 101 97 106 86  Temp: 97.9 F (36.6 C) 98.5 F (36.9 C) 98.3 F (36.8 C) 98.8 F (37.1 C)  TempSrc: Oral Oral Oral Oral  Resp: 18 18 18 18   Height:      Weight: 173 lb 6.4 oz (78.654 kg)   170 lb 10.2 oz (77.4 kg)  SpO2: 96% 100% 96% 98%    Intake/Output Summary (Last 24 hours) at 01/24/14 0912 Last data filed at 01/24/14 0500  Gross per 24 hour  Intake    480 ml  Output   3675 ml  Net  -3195 ml   Filed Weights   01/22/14 1410 01/23/14 0531 01/24/14 0503  Weight: 175 lb 14.8 oz (79.8 kg) 173 lb 6.4 oz (78.654 kg) 170 lb 10.2 oz (77.4 kg)    PHYSICAL EXAM  General: Pleasant, elderly woman, NAD. Neuro: Alert and oriented X 3. Moves all extremities spontaneously. HEENT:  Normal  Neck: Supple without bruits, 7 cm JVD. Lungs:  Resp regular and unlabored, except for basilar rales. Heart: IRRR no s3, s4, or murmurs. Abdomen: Soft, non-tender, non-distended, BS + x 4.  Extremities: No clubbing, cyanosis or edema. DP/PT/Radials 2+ and equal bilaterally.  Accessory Clinical Findings  CBC No results found  for this basename: WBC, NEUTROABS, HGB, HCT, MCV, PLT,  in the last 72 hours Basic Metabolic Panel  Recent Labs  01/23/14 1144 01/24/14 0405  NA 146 148*  K 3.8 3.5*  CL 103 104  CO2 28 31  GLUCOSE 92 96  BUN 34* 29*  CREATININE 1.74* 1.57*  CALCIUM 9.3 8.9   Liver Function Tests No results found for this basename: AST, ALT, ALKPHOS, BILITOT, PROT, ALBUMIN,  in the last 72 hours No results found for this basename: LIPASE, AMYLASE,  in the last 72 hours Cardiac Enzymes No results found for this basename: CKTOTAL, CKMB, CKMBINDEX, TROPONINI,  in the last 72 hours BNP No components found with this basename: POCBNP,  D-Dimer No results found for this basename: DDIMER,  in the last 72 hours Hemoglobin A1C No results found for this basename: HGBA1C,  in the last 72 hours Fasting Lipid Panel No results found for this basename: CHOL, HDL, LDLCALC, TRIG, CHOLHDL, LDLDIRECT,  in the last 72 hours Thyroid Function Tests No results found for this basename: TSH, T4TOTAL, FREET3, T3FREE, THYROIDAB,  in the last 72 hours  TELE  Atrial fib  Radiology/Studies  Ct Abdomen  Pelvis Wo Contrast  01/21/2014   CLINICAL DATA:  Abdominal wall pain, on Coumadin, history hypertension, CHF, appendectomy, mitral valve prolapse, atrial fibrillation, MI  EXAM: CT ABDOMEN AND PELVIS WITHOUT CONTRAST  TECHNIQUE: Multidetector CT imaging of the abdomen and pelvis was performed following the standard protocol without IV contrast. Sagittal and coronal MPR images reconstructed from axial data set. Oral contrast was administered.  COMPARISON:  12/14/2013  FINDINGS: Enlargement of cardiac chambers particularly LEFT atrium.  Scattered atherosclerotic calcifications aorta and coronary arteries.  Atelectasis RIGHT lower lobe.  BILATERAL renal cortical atrophy with small cyst and nonobstructing calculus at inferior pole RIGHT kidney.  Within limits of a nonenhanced exam, liver, spleen, pancreas, kidneys, and adrenal  glands otherwise normal appearance.  Dependent calcification within gallbladder question tiny gallstone.  Stomach and small bowel loops grossly normal appearance for technique.  Focus of subcutaneous infiltration identified in the anterior LEFT pelvis at site of previously identified subcutaneous hematoma, decreased in size since previous study.  No new abdominal wall hemorrhage seen.  Sigmoid diverticulosis with wall thickening and minimal pericolic inflammatory changes at the distal sigmoid colon consistent with acute diverticulitis little changed from previous exam.  Minimal free pelvic fluid, decreased.  Remainder of colon normal appearance.  Foley catheter decompresses urinary bladder.  No mass, adenopathy, free air, hernia, or acute osseous findings.  IMPRESSION: Decreased size of small subcutaneous hematoma in the anterior LEFT pelvis.  Minimal diverticulitis changes of the distal sigmoid colon, little changed from previous exam.  Question cholelithiasis.  Tiny nonobstructing RIGHT renal calculus.  Enlargement of cardiac chambers with extensive atherosclerotic disease and subsegmental atelectasis RIGHT lower lobe.   Electronically Signed   By: Ulyses Southward M.D.   On: 01/21/2014 17:57   Dg Chest 2 View  01/20/2014   CLINICAL DATA:  Chest pain and shortness of breath.  EXAM: CHEST  2 VIEW  COMPARISON:  01/16/2014  FINDINGS: Enlargement of the cardiac silhouette is unchanged. Thoracic aorta is mildly tortuous. Thoracic aortic calcification is again seen. Eventration of the right hemidiaphragm is unchanged with associated right basilar atelectasis. The lungs are otherwise clear. There is no pleural effusion or pneumothorax. No acute osseous abnormality is seen.  IMPRESSION: No active cardiopulmonary disease.   Electronically Signed   By: Sebastian Ache   On: 01/20/2014 17:10   Dg Chest 2 View  01/16/2014   CLINICAL DATA:  Chest pain.  Shortness of breath.  EXAM: CHEST  2 VIEW  COMPARISON:  Two-view chest  01/12/2014  FINDINGS: The heart is enlarged. There is no edema or effusion to suggest failure. Minimal atelectasis at the right base is noted. Eventration of the right hemidiaphragm is stable. Atherosclerotic calcifications are again noted within the aorta. The visualized soft tissues and bony thorax are otherwise unremarkable.  IMPRESSION: 1. Stable cardiomegaly without failure. 2. Stable right lower lobe atelectasis. 3. No acute cardiopulmonary disease.   Electronically Signed   By: Gennette Pac M.D.   On: 01/16/2014 11:04   Dg Chest 2 View  01/12/2014   CLINICAL DATA:  Shortness of breath and chest pain.  EXAM: CHEST  2 VIEW  COMPARISON:  Single view of the chest 12/15/1998 is seen. PA and lateral chest 08/03/2012.  FINDINGS: Elevation of the right hemidiaphragm is again seen. There is cardiomegaly without edema. Lungs are clear. No pneumothorax or pleural effusion. No focal bony abnormality.  IMPRESSION: Cardiomegaly without acute disease.  Stable compared to prior exam.   Electronically Signed   By: Maisie Fus  Dalessio M.D.   On: 01/12/2014 11:48    ASSESSMENT AND PLAN 1. Symptomatic tachy-brady 2. Chronic atrial fib 3. Non-ischemic cm 4. Acute on chronic systolic CHF Rec: note plans for PPM. She is nearly but not quite euvolemic. Continue IV lasix and watch renal function.  Gregg Taylor,M.D.  01/24/2014 9:12 AM

## 2014-01-25 LAB — BASIC METABOLIC PANEL
ANION GAP: 14 (ref 5–15)
BUN: 26 mg/dL — AB (ref 6–23)
CHLORIDE: 101 meq/L (ref 96–112)
CO2: 32 mEq/L (ref 19–32)
Calcium: 9.3 mg/dL (ref 8.4–10.5)
Creatinine, Ser: 1.58 mg/dL — ABNORMAL HIGH (ref 0.50–1.10)
GFR calc non Af Amer: 30 mL/min — ABNORMAL LOW (ref >90)
GFR, EST AFRICAN AMERICAN: 35 mL/min — AB (ref >90)
Glucose, Bld: 89 mg/dL (ref 70–99)
POTASSIUM: 3.7 meq/L (ref 3.7–5.3)
Sodium: 147 mEq/L (ref 137–147)

## 2014-01-25 LAB — PROTIME-INR
INR: 1.65 — ABNORMAL HIGH (ref 0.00–1.49)
Prothrombin Time: 19.5 seconds — ABNORMAL HIGH (ref 11.6–15.2)

## 2014-01-25 MED ORDER — WARFARIN SODIUM 5 MG PO TABS
5.0000 mg | ORAL_TABLET | Freq: Once | ORAL | Status: AC
  Administered 2014-01-25: 5 mg via ORAL
  Filled 2014-01-25: qty 1

## 2014-01-25 MED ORDER — DILTIAZEM HCL 100 MG IV SOLR
5.0000 mg/h | INTRAVENOUS | Status: DC
  Administered 2014-01-25: 10 mg/h via INTRAVENOUS
  Administered 2014-01-25: 5 mg/h via INTRAVENOUS
  Filled 2014-01-25 (×4): qty 100

## 2014-01-25 NOTE — Progress Notes (Signed)
Patient alert and oriented x4 throughout shift, family at bedside.  Cardizem gtt ordered this AM as patient's HR elevated in 120s-140s.  Cardizem gtt titrated per parameters (see MAR).  Consent for pacemaker placement signed and in chart.  Patient aware of plan for clear liquids tray at breakfast and NPO after until procedure.  Patient denies any questions or concerns at this time.  Will continue to monitor.

## 2014-01-25 NOTE — Progress Notes (Signed)
ANTICOAGULATION CONSULT NOTE - FOLLOW UP  Pharmacy Consult:  Coumadin Indication: atrial fibrillation  Allergies  Allergen Reactions  . Codeine Other (See Comments)    Makes patient sleepy.  Marland Kitchen Penicillins Rash    Patient Measurements: Height: 5\' 5"  (165.1 cm) Weight: 163 lb 11.2 oz (74.254 kg) (scale c) IBW/kg (Calculated) : 57  Vital Signs: Temp: 98.4 F (36.9 C) (07/19 0639) Temp src: Oral (07/19 0639) BP: 105/56 mmHg (07/19 0916) Pulse Rate: 113 (07/19 0916)  Labs:  Recent Labs  01/23/14 0448 01/23/14 1144 01/24/14 0405 01/25/14 0323  LABPROT 27.8*  --  22.7* 19.5*  INR 2.59*  --  2.00* 1.65*  CREATININE  --  1.74* 1.57* 1.58*    Estimated Creatinine Clearance: 29.6 ml/min (by C-G formula based on Cr of 1.58).     Assessment: 80 year old female with history of Afib on chronic Coumadin.  EP wants to continue low dose Coumadin and keep her INR < 2.5 for possible pacer on Monday.  INR decreased to sub-therapeutic level today mainly due to held doses on 7/14 through 7/16.  No bleeding reported.   Goal of Therapy:  INR goal 2-2.5 until decision for pacer is established   Plan:  - Coumadin 5mg  PO today - Daily PT / INR - F/U K+ and consider holding KCL if it continues to rise (Lasix d/c'ed)    Samhitha Rosen D. , PharmD, BCPS Pager:  [phone] 01/25/2014, 10:37 AM

## 2014-01-25 NOTE — Progress Notes (Signed)
Patient ID: TANARA TURVEY, female   DOB: April 07, 1933, 80 y.o.   MRN: [id number]   Patient Name: Marissa [name] Date of Encounter: 01/25/2014     Active Problems:   CHF (congestive heart failure)    SUBJECTIVE Denies chest pain or sob.  CURRENT MEDS . atorvastatin  40 mg Oral q1800  . cefTRIAXone (ROCEPHIN)  IV  1 g Intravenous Daily  . febuxostat  40 mg Oral Daily  . furosemide  80 mg Intravenous BID  . gabapentin  300 mg Oral QHS  . hydroxychloroquine  200 mg Oral BID  . pantoprazole  40 mg Oral Daily  . potassium chloride  10 mEq Oral BID  . predniSONE  2 mg Oral Q breakfast  . sodium chloride  3 mL Intravenous Q12H  . sodium chloride  3 mL Intravenous Q12H  . Warfarin - Pharmacist Dosing Inpatient   Does not apply q1800    OBJECTIVE  Filed Vitals:   01/24/14 1824 01/24/14 1828 01/24/14 2035 01/25/14 0639  BP: 112/74 118/68 112/62 114/73  Pulse: 109 112 111 119  Temp:   99 F (37.2 C) 98.4 F (36.9 C)  TempSrc:   Oral Oral  Resp:   18 18  Height:      Weight:    163 lb 11.2 oz (74.254 kg)  SpO2:   94% 93%    Intake/Output Summary (Last 24 hours) at 01/25/14 0830 Last data filed at 01/25/14 0824  Gross per 24 hour  Intake    533 ml  Output   2676 ml  Net  -2143 ml   Filed Weights   01/23/14 0531 01/24/14 0503 01/25/14 0639  Weight: 173 lb 6.4 oz (78.654 kg) 170 lb 10.2 oz (77.4 kg) 163 lb 11.2 oz (74.254 kg)    PHYSICAL EXAM  General: Pleasant, elderly woman, NAD. Neuro: Alert and oriented X 3. Moves all extremities spontaneously. HEENT:  Normal  Neck: Supple without bruits, 7 cm JVD. Lungs:  Resp regular and unlabored, CTA except for basilar rales Heart: IRIR tachy no s3, s4, or murmurs. Abdomen: Soft, non-tender, non-distended, BS + x 4.  Extremities: No clubbing, cyanosis or edema. DP/PT/Radials 2+ and equal bilaterally.  Accessory Clinical Findings  CBC No results found for this basename: WBC, NEUTROABS, HGB, HCT, MCV, PLT,  in the last 72  hours Basic Metabolic Panel  Recent Labs  01/24/14 0405 01/25/14 0323  NA 148* 147  K 3.5* 3.7  CL 104 101  CO2 31 32  GLUCOSE 96 89  BUN 29* 26*  CREATININE 1.57* 1.58*  CALCIUM 8.9 9.3   Liver Function Tests No results found for this basename: AST, ALT, ALKPHOS, BILITOT, PROT, ALBUMIN,  in the last 72 hours No results found for this basename: LIPASE, AMYLASE,  in the last 72 hours Cardiac Enzymes No results found for this basename: CKTOTAL, CKMB, CKMBINDEX, TROPONINI,  in the last 72 hours BNP No components found with this basename: POCBNP,  D-Dimer No results found for this basename: DDIMER,  in the last 72 hours Hemoglobin A1C No results found for this basename: HGBA1C,  in the last 72 hours Fasting Lipid Panel No results found for this basename: CHOL, HDL, LDLCALC, TRIG, CHOLHDL, LDLDIRECT,  in the last 72 hours Thyroid Function Tests No results found for this basename: TSH, T4TOTAL, FREET3, T3FREE, THYROIDAB,  in the last 72 hours  TELE Atrial fib with an RVR  Radiology/Studies  Ct Abdomen Pelvis Wo Contrast  01/21/2014   CLINICAL DATA:  Abdominal wall pain, on Coumadin, history hypertension, CHF, appendectomy, mitral valve prolapse, atrial fibrillation, MI  EXAM: CT ABDOMEN AND PELVIS WITHOUT CONTRAST  TECHNIQUE: Multidetector CT imaging of the abdomen and pelvis was performed following the standard protocol without IV contrast. Sagittal and coronal MPR images reconstructed from axial data set. Oral contrast was administered.  COMPARISON:  12/14/2013  FINDINGS: Enlargement of cardiac chambers particularly LEFT atrium.  Scattered atherosclerotic calcifications aorta and coronary arteries.  Atelectasis RIGHT lower lobe.  BILATERAL renal cortical atrophy with small cyst and nonobstructing calculus at inferior pole RIGHT kidney.  Within limits of a nonenhanced exam, liver, spleen, pancreas, kidneys, and adrenal glands otherwise normal appearance.  Dependent calcification  within gallbladder question tiny gallstone.  Stomach and small bowel loops grossly normal appearance for technique.  Focus of subcutaneous infiltration identified in the anterior LEFT pelvis at site of previously identified subcutaneous hematoma, decreased in size since previous study.  No new abdominal wall hemorrhage seen.  Sigmoid diverticulosis with wall thickening and minimal pericolic inflammatory changes at the distal sigmoid colon consistent with acute diverticulitis little changed from previous exam.  Minimal free pelvic fluid, decreased.  Remainder of colon normal appearance.  Foley catheter decompresses urinary bladder.  No mass, adenopathy, free air, hernia, or acute osseous findings.  IMPRESSION: Decreased size of small subcutaneous hematoma in the anterior LEFT pelvis.  Minimal diverticulitis changes of the distal sigmoid colon, little changed from previous exam.  Question cholelithiasis.  Tiny nonobstructing RIGHT renal calculus.  Enlargement of cardiac chambers with extensive atherosclerotic disease and subsegmental atelectasis RIGHT lower lobe.   Electronically Signed   By: Ulyses Southward M.D.   On: 01/21/2014 17:57   Dg Chest 2 View  01/20/2014   CLINICAL DATA:  Chest pain and shortness of breath.  EXAM: CHEST  2 VIEW  COMPARISON:  01/16/2014  FINDINGS: Enlargement of the cardiac silhouette is unchanged. Thoracic aorta is mildly tortuous. Thoracic aortic calcification is again seen. Eventration of the right hemidiaphragm is unchanged with associated right basilar atelectasis. The lungs are otherwise clear. There is no pleural effusion or pneumothorax. No acute osseous abnormality is seen.  IMPRESSION: No active cardiopulmonary disease.   Electronically Signed   By: Sebastian Ache   On: 01/20/2014 17:10   Dg Chest 2 View  01/16/2014   CLINICAL DATA:  Chest pain.  Shortness of breath.  EXAM: CHEST  2 VIEW  COMPARISON:  Two-view chest 01/12/2014  FINDINGS: The heart is enlarged. There is no edema or  effusion to suggest failure. Minimal atelectasis at the right base is noted. Eventration of the right hemidiaphragm is stable. Atherosclerotic calcifications are again noted within the aorta. The visualized soft tissues and bony thorax are otherwise unremarkable.  IMPRESSION: 1. Stable cardiomegaly without failure. 2. Stable right lower lobe atelectasis. 3. No acute cardiopulmonary disease.   Electronically Signed   By: Gennette Pac M.D.   On: 01/16/2014 11:04   Dg Chest 2 View  01/12/2014   CLINICAL DATA:  Shortness of breath and chest pain.  EXAM: CHEST  2 VIEW  COMPARISON:  Single view of the chest 12/15/1998 is seen. PA and lateral chest 08/03/2012.  FINDINGS: Elevation of the right hemidiaphragm is again seen. There is cardiomegaly without edema. Lungs are clear. No pneumothorax or pleural effusion. No focal bony abnormality.  IMPRESSION: Cardiomegaly without acute disease.  Stable compared to prior exam.   Electronically Signed   By: Drusilla Kanner M.D.   On: 01/12/2014 11:48   Nm  Myocar Multi W/spect W/wall Motion / Ef  01/24/2014   CLINICAL DATA:  Current history of hypertension, chronic systolic heart failure, nonischemic cardiomyopathy, tachy-brady syndrome, hyperlipidemia, and prior non ST elevated myocardial infarction, presenting with chest pain.  EXAM: MYOCARDIAL IMAGING WITH SPECT (REST AND PHARMACOLOGIC-STRESS)  GATED LEFT VENTRICULAR WALL MOTION STUDY  LEFT VENTRICULAR EJECTION FRACTION  TECHNIQUE: Standard myocardial SPECT imaging was performed after resting intravenous injection of 10 mCi Tc-46m sestamibi. Subsequently, intravenous infusion of Lexiscan was performed under the supervision of the Cardiology staff. At peak effect of the drug, 30 mCi Tc-43m sestamibi was injected intravenously and standard myocardial SPECT imaging was performed. Quantitative gated imaging was also performed to evaluate left ventricular wall motion, and estimate left ventricular ejection fraction.  COMPARISON:   None.  FINDINGS: Immediate post regadenoson images demonstrate diminished activity in the lateral wall and inferior wall relative to the remainder of the myocardium. Initial resting images suggest slight reversibility to the lateral wall anteriorly, but there is no evidence of inferior reversibility. Anterolateral reversibility is only slightly suggested by the computer generated polar map, as the reversibility perfusion percentage is 13.  Gated images demonstrate severe global hypokinesis.  Estimated QGS left ventricular ejection fraction measured 14%, with an end-diastolic volume of 134 ml and an end systolic volume of 115 ml.  IMPRESSION: 1. Inferior wall and lateral wall infarcts with minimal peri-infarct ischemia involving the lateral wall anteriorly. 2. Severe global left ventricular hypokinesis. 3. Estimated QGS ejection fraction 14%, consistent with the history of cardiomyopathy.   Electronically Signed   By: Hulan Saas M.D.   On: 01/24/2014 13:01    ASSESSMENT AND PLAN 1. Atrial fibrillation with an RVR 2. Non-ischemic CM 3. Symptomatic tachy-brady syndrome Rec: her ventricular rates are elevated. She could be a little dry. Will hold lasix as her weight is down. Will add IV cardizem for rate control. Will continue her foley and plan to pace tomorrow.  Gregg Taylor,M.D.  01/25/2014 8:30 AM

## 2014-01-25 NOTE — Plan of Care (Signed)
Problem: Phase I Progression Outcomes Goal: EF % per last Echo/documented,Core Reminder form on chart Outcome: Completed/Met Date Met:  01/25/14 EF 15-20% as of 01/21/2014

## 2014-01-26 ENCOUNTER — Encounter (HOSPITAL_COMMUNITY)
Admission: EM | Disposition: A | Discharge status: Skilled Nursing Facility | Payer: Self-pay | Source: Home / Self Care | Attending: Internal Medicine

## 2014-01-26 DIAGNOSIS — R001 Bradycardia, unspecified: Secondary | ICD-10-CM

## 2014-01-26 DIAGNOSIS — I495 Sick sinus syndrome: Secondary | ICD-10-CM

## 2014-01-26 HISTORY — PX: PACEMAKER INSERTION: SHX728

## 2014-01-26 LAB — CBC
HCT: 38.9 % (ref 36.0–46.0)
Hemoglobin: 12 g/dL (ref 12.0–15.0)
MCH: 28.1 pg (ref 26.0–34.0)
MCHC: 30.8 g/dL (ref 30.0–36.0)
MCV: 91.1 fL (ref 78.0–100.0)
Platelets: 210 10*3/uL (ref 150–400)
RBC: 4.27 MIL/uL (ref 3.87–5.11)
RDW: 15.5 % (ref 11.5–15.5)
WBC: 8.6 10*3/uL (ref 4.0–10.5)

## 2014-01-26 LAB — BASIC METABOLIC PANEL
ANION GAP: 13 (ref 5–15)
BUN: 26 mg/dL — ABNORMAL HIGH (ref 6–23)
CALCIUM: 9.2 mg/dL (ref 8.4–10.5)
CO2: 31 meq/L (ref 19–32)
Chloride: 100 mEq/L (ref 96–112)
Creatinine, Ser: 1.74 mg/dL — ABNORMAL HIGH (ref 0.50–1.10)
GFR calc Af Amer: 31 mL/min — ABNORMAL LOW (ref >90)
GFR, EST NON AFRICAN AMERICAN: 27 mL/min — AB (ref >90)
Glucose, Bld: 92 mg/dL (ref 70–99)
Potassium: 3.8 mEq/L (ref 3.7–5.3)
Sodium: 144 mEq/L (ref 137–147)

## 2014-01-26 LAB — PROTIME-INR
INR: 1.91 — AB (ref 0.00–1.49)
PROTHROMBIN TIME: 21.9 s — AB (ref 11.6–15.2)

## 2014-01-26 SURGERY — PERMANENT PACEMAKER INSERTION
Anesthesia: LOCAL

## 2014-01-26 MED ORDER — LIDOCAINE HCL (PF) 1 % IJ SOLN
INTRAMUSCULAR | Status: AC
  Filled 2014-01-26: qty 30

## 2014-01-26 MED ORDER — LIDOCAINE HCL (PF) 1 % IJ SOLN
INTRAMUSCULAR | Status: AC
  Filled 2014-01-26: qty 60

## 2014-01-26 MED ORDER — CHLORHEXIDINE GLUCONATE 4 % EX LIQD
60.0000 mL | Freq: Once | CUTANEOUS | Status: AC
  Administered 2014-01-26: 4 via TOPICAL
  Filled 2014-01-26: qty 60

## 2014-01-26 MED ORDER — METOPROLOL SUCCINATE ER 25 MG PO TB24
25.0000 mg | ORAL_TABLET | Freq: Two times a day (BID) | ORAL | Status: DC
  Administered 2014-01-26: 25 mg via ORAL
  Filled 2014-01-26 (×2): qty 1

## 2014-01-26 MED ORDER — SODIUM CHLORIDE 0.9 % IV SOLN: INTRAVENOUS | Status: DC

## 2014-01-26 MED ORDER — VANCOMYCIN HCL IN DEXTROSE 1-5 GM/200ML-% IV SOLN
1000.0000 mg | Freq: Two times a day (BID) | INTRAVENOUS | Status: AC
  Administered 2014-01-27: 1000 mg via INTRAVENOUS
  Filled 2014-01-26: qty 200

## 2014-01-26 MED ORDER — VANCOMYCIN HCL IN DEXTROSE 1-5 GM/200ML-% IV SOLN
1000.0000 mg | INTRAVENOUS | Status: DC
  Filled 2014-01-26: qty 200

## 2014-01-26 MED ORDER — WARFARIN SODIUM 2.5 MG PO TABS
2.5000 mg | ORAL_TABLET | Freq: Once | ORAL | Status: AC
  Administered 2014-01-26: 2.5 mg via ORAL
  Filled 2014-01-26: qty 1

## 2014-01-26 MED ORDER — STERILE WATER FOR INJECTION IJ SOLN
INTRAMUSCULAR | Status: AC
  Filled 2014-01-26: qty 10

## 2014-01-26 MED ORDER — ACETAMINOPHEN 325 MG PO TABS
325.0000 mg | ORAL_TABLET | ORAL | Status: DC | PRN
  Filled 2014-01-26: qty 2

## 2014-01-26 MED ORDER — SODIUM CHLORIDE 0.9 % IV SOLN
INTRAVENOUS | Status: DC
  Administered 2014-01-26: 16:00:00 via INTRAVENOUS

## 2014-01-26 MED ORDER — SODIUM CHLORIDE 0.9 % IV SOLN
INTRAVENOUS | Status: DC
  Administered 2014-01-26: 06:00:00 via INTRAVENOUS

## 2014-01-26 MED ORDER — VANCOMYCIN HCL 1000 MG IV SOLR
1000.0000 mg | INTRAVENOUS | Status: DC
  Filled 2014-01-26: qty 1000

## 2014-01-26 MED ORDER — MIDAZOLAM HCL 5 MG/5ML IJ SOLN
INTRAMUSCULAR | Status: AC
  Filled 2014-01-26: qty 5

## 2014-01-26 MED ORDER — METOPROLOL SUCCINATE ER 25 MG PO TB24
25.0000 mg | ORAL_TABLET | Freq: Two times a day (BID) | ORAL | Status: DC
  Administered 2014-01-27 (×2): 25 mg via ORAL
  Filled 2014-01-26 (×4): qty 1

## 2014-01-26 MED ORDER — SODIUM CHLORIDE 0.9 % IR SOLN
80.0000 mg | Status: DC
  Filled 2014-01-26: qty 2

## 2014-01-26 MED ORDER — FENTANYL CITRATE 0.05 MG/ML IJ SOLN
INTRAMUSCULAR | Status: AC
  Filled 2014-01-26: qty 2

## 2014-01-26 NOTE — Progress Notes (Signed)
ANTICOAGULATION CONSULT NOTE - FOLLOW UP  Pharmacy Consult:  Coumadin Indication: atrial fibrillation  Allergies  Allergen Reactions  . Codeine Other (See Comments)    Makes patient sleepy.  Marland Kitchen Penicillins Rash    Patient Measurements: Height: 5\' 5"  (165.1 cm) Weight: 165 lb 12.6 oz (75.2 kg) IBW/kg (Calculated) : 57  Vital Signs: Temp: 98.6 F (37 C) (07/20 0958) Temp src: Oral (07/20 0958) BP: 113/76 mmHg (07/20 0958) Pulse Rate: 94 (07/20 0958)  Labs:  Recent Labs  01/24/14 0405 01/25/14 0323 01/26/14 0355  HGB  --   --  12.0  HCT  --   --  38.9  PLT  --   --  210  LABPROT 22.7* 19.5* 21.9*  INR 2.00* 1.65* 1.91*  CREATININE 1.57* 1.58* 1.74*    Estimated Creatinine Clearance: 27 ml/min (by C-G formula based on Cr of 1.74).  Assessment: 80 year old female with history of Afib on chronic Coumadin. EP wants to continue low dose Coumadin and keep her INR < 2.5 for possible pacer today. INR increased from 1.65 to 1.91 today. Hg 12, plt normal. No bleeding reported.  PTA Coumadin dose: 5mg  daily except 2.5mg  on Mon / Thurs, last home dose 7/13  Goal of Therapy:  INR goal 2-2.5 until decision for pacer is established  Plan:  - Coumadin 2.5mg  PO today - Daily PT / INR  Shequila Neglia A. Mon, PharmD Clinical Pharmacist Pager: [phone] Pharmacy: [phone] 01/26/2014 10:34 AM

## 2014-01-26 NOTE — H&P (View-Only) (Signed)
ELECTROPHYSIOLOGY CONSULT NOTE    Patient ID: Marissa [name] MRN: [id number], DOB/AGE: [birth year] 78 y.o.  Admit date: 01/20/2014 Date of Consult: 01-23-14  Primary Physician: Carylon Perches, MD Primary Cardiologist: Purvis Sheffield  Reason for Consultation: bradycardia  HPI:  Marissa [name] is a 80 y.o. female with a past medical history significant for CAD (NSTEMI 01-2013, medical therapy recommended), hypertension, longstanding mixed cardiomyopathy, renal insufficiency, atrial fibrillation, and hyperlipidemia.  She has had progressive decline over the last month with increasing shortness of breath and fatigue.  She presented to [facility] for evaluation.  She was diuresed but further medical options were limited due to sinus bradycardia/afib with RVR and she was transferred to [facility] for evaluation of pacemaker implantation.   She reports a 25 year history of atrial fibrillation. She is not in atrial fibrillation all the time and thinks she feels worse when in sinus rhythm because of bradycardia.  She lives alone at home and is functionally limited by shortness of breath and fatigue.   Echo 01-21-14 demonstrated EF 15-20%, diffuse hypokinesis, LA 62, mild AS, moderate MR, moderate to severe TR, PA pressure 55.    Cardiac catheterization 01-26-13 demonstrated significant single vessel CAD with diffuse 95% stenosis in proximal OM3.  It was felt that risks of PCI outweighed benefits and medical therapy was recommended.   She had a fall about a month ago that occurred in the context of standing quickly.  She does not remember the event. She denies chest pain, lower extremity edema, recent fevers or chills, nausea or vomiting.  ROS is otherwise negative.   EP has been asked to evaluate for treatment options.   Past Medical History  Diagnosis Date  . Permanent atrial fibrillation     A.  Chronic Coumadin  . MVP (mitral valve prolapse)   . Essential hypertension, benign   . Nonischemic cardiomyopathy 2001   A.  07/23/11 - Echo: EF 35%;  B. 07/24/2011 - Cath: nonobs dzs ef 35%, 2+MR C. Echo 01/06/13: EF 40-45%, mild LVH, mlid MR, mod biatrial enlargment, mild RV dilatation, mod TR, PASP 54 mmHg  . Systolic CHF, chronic     A. Diag 07/2011, EF 35%  . Lumbar disc disease     Laminectomy 2001  . History of endometrial cancer   . Hyperlipidemia   . Rheumatoid arthritis     Treated in past with Remicade and developed vasculitis  . Gout   . PONV (postoperative nausea and vomiting)   . NSTEMI (non-ST elevated myocardial infarction) 12/2012  . GERD (gastroesophageal reflux disease)   . Coronary artery dissection 12/2012    95% OM3 hazy lesion on cath, medically managed  . Lumbar compression fracture 2014    L3, L4     Surgical History:  Past Surgical History  Procedure Laterality Date  . Appendectomy    . Abdominal hysterectomy  2000    Stage 1 endometrial cancer  . Hernia repair  09/2008  . Hernia repair  04/17/11    Lap ventral incisional hernia repair with mesh   . Gastric biopsy      Benign  . Excision of lipoma    . Lumbar laminectomy  2001  . Hammer toe surgery    . Cardiac catheterization  01/08/13    OM3 95% lesion felt to represent coronary dissection, mild luminal irregularities elsewhere; EF 40-45%; medically managed     Prescriptions prior to admission  Medication Sig Dispense Refill  . atorvastatin (LIPITOR) 40 MG tablet Take 40 mg  by mouth daily.      . Cholecalciferol (VITAMIN D) 2000 UNITS CAPS Take 2,000 Units by mouth daily.      . digoxin (LANOXIN) 0.125 MG tablet Take 1 tablet (0.125 mg total) by mouth daily.  30 tablet  12  . febuxostat (ULORIC) 40 MG tablet Take 40 mg by mouth daily.      . furosemide (LASIX) 40 MG tablet Take 1 tablet (40 mg total) by mouth 2 (two) times daily.  30 tablet  6  . gabapentin (NEURONTIN) 300 MG capsule Take 300 mg by mouth at bedtime.      . hydroxychloroquine (PLAQUENIL) 200 MG tablet Take 1 tablet by mouth Twice daily.      . Hypromellose  (GENTEAL OP) Place 1 drop into both eyes daily as needed. Tired Eyes      . isosorbide dinitrate (ISORDIL) 5 MG tablet Take 10 mg by mouth 2 (two) times daily.       Marland Kitchen losartan (COZAAR) 50 MG tablet Take 25 mg by mouth daily.      . metoprolol succinate (TOPROL-XL) 50 MG 24 hr tablet Take 50 mg by mouth daily.       Marland Kitchen omeprazole (PRILOSEC) 20 MG capsule Take 20 mg by mouth daily.      . potassium chloride (K-DUR,KLOR-CON) 10 MEQ tablet Take 10 mEq by mouth 2 (two) times daily.       . predniSONE (DELTASONE) 1 MG tablet Take 2 mg by mouth daily with breakfast.       . warfarin (COUMADIN) 5 MG tablet Take 2.5-5 mg by mouth daily. Take 1 tablet daily except 1/2 tablet on Mondays and Thursdays      . nitroGLYCERIN (NITROSTAT) 0.4 MG SL tablet Place 1 tablet (0.4 mg total) under the tongue every 5 (five) minutes as needed for chest pain.  25 tablet  12  . traMADol (ULTRAM) 50 MG tablet Take 1 tablet (50 mg total) by mouth every 6 (six) hours as needed for moderate pain.  30 tablet  5    Inpatient Medications:  . atorvastatin  40 mg Oral q1800  . ciprofloxacin  250 mg Oral BID  . febuxostat  40 mg Oral Daily  . furosemide  80 mg Intravenous BID  . gabapentin  300 mg Oral QHS  . hydroxychloroquine  200 mg Oral BID  . losartan  25 mg Oral Daily  . pantoprazole  40 mg Oral Daily  . potassium chloride  10 mEq Oral BID  . predniSONE  2 mg Oral Q breakfast  . sodium chloride  3 mL Intravenous Q12H  . sodium chloride  3 mL Intravenous Q12H    Allergies:  Allergies  Allergen Reactions  . Codeine Other (See Comments)    Makes patient sleepy.  Marland Kitchen Penicillins Rash    History   Social History  . Marital Status: Widowed    Spouse Name: N/A    Number of Children: N/A  . Years of Education: N/A   Occupational History  . Retired   .     Social History Main Topics  . Smoking status: Never Smoker   . Smokeless tobacco: Never Used  . Alcohol Use: Yes     Comment: Rare  . Drug Use: No  .  Sexual Activity: Not on file   Other Topics Concern  . Not on file   Social History Narrative  . No narrative on file     Family History  Problem Relation Age of  Onset  . Cancer Father     BP 104/64  Pulse 101  Temp(Src) 97.9 F (36.6 C) (Oral)  Resp 18  Ht 5\' 5"  (1.651 m)  Wt 173 lb 6.4 oz (78.654 kg)  BMI 28.86 kg/m2  SpO2 96%  Physical Exam: Physical Exam: Filed Vitals:   01/22/14 1410 01/22/14 2100 01/23/14 0151 01/23/14 0531  BP: 126/59 112/70 95/61 104/64  Pulse: 51 85 60 101  Temp: 97.4 F (36.3 C) 97.4 F (36.3 C) 97.9 F (36.6 C) 97.9 F (36.6 C)  TempSrc: Oral Oral Oral Oral  Resp: 18 20 20 18   Height: 5\' 5"  (1.651 m)     Weight: 175 lb 14.8 oz (79.8 kg)   173 lb 6.4 oz (78.654 kg)  SpO2: 100% 97% 96% 96%    GEN- The patient is elderly appearing, alert and oriented x 3 today.   Head- normocephalic, atraumatic Eyes-  Sclera clear, conjunctiva pink Ears- hearing intact Oropharynx- clear Neck- supple, + JVD Lungs- Clear to ausculation bilaterally, normal work of breathing Heart- irregular rate and rhythm, 2/6 SEM LSB GI- soft, NT, ND, + BS Extremities- no clubbing, cyanosis, + dependant edema  MS- diffuse muscle atrophy Skin- no rash or lesion Psych- euthymic mood, full affect Neuro- strength and sensation are intact  Labs:   Lab Results  Component Value Date   WBC 7.1 01/21/2014   HGB 11.8* 01/21/2014   HCT 37.0 01/21/2014   MCV 91.6 01/21/2014   PLT 190 01/21/2014    Recent Labs Lab 01/20/14 1615 01/21/14 0640  NA 146 147  K 4.1 4.3  CL 105 107  CO2 28 29  BUN 40* 42*  CREATININE 1.88* 2.00*  CALCIUM 10.3 10.0  PROT 7.0  --   BILITOT 0.5  --   ALKPHOS 93  --   ALT 19  --   AST 25  --   GLUCOSE 121* 94    Radiology/Studies: Ct Abdomen Pelvis Wo Contrast 01/21/2014   CLINICAL DATA:  Abdominal wall pain, on Coumadin, history hypertension, CHF, appendectomy, mitral valve prolapse, atrial fibrillation, MI  EXAM: CT ABDOMEN AND PELVIS  WITHOUT CONTRAST  TECHNIQUE: Multidetector CT imaging of the abdomen and pelvis was performed following the standard protocol without IV contrast. Sagittal and coronal MPR images reconstructed from axial data set. Oral contrast was administered.  COMPARISON:  12/14/2013  FINDINGS: Enlargement of cardiac chambers particularly LEFT atrium.  Scattered atherosclerotic calcifications aorta and coronary arteries.  Atelectasis RIGHT lower lobe.  BILATERAL renal cortical atrophy with small cyst and nonobstructing calculus at inferior pole RIGHT kidney.  Within limits of a nonenhanced exam, liver, spleen, pancreas, kidneys, and adrenal glands otherwise normal appearance.  Dependent calcification within gallbladder question tiny gallstone.  Stomach and small bowel loops grossly normal appearance for technique.  Focus of subcutaneous infiltration identified in the anterior LEFT pelvis at site of previously identified subcutaneous hematoma, decreased in size since previous study.  No new abdominal wall hemorrhage seen.  Sigmoid diverticulosis with wall thickening and minimal pericolic inflammatory changes at the distal sigmoid colon consistent with acute diverticulitis little changed from previous exam.  Minimal free pelvic fluid, decreased.  Remainder of colon normal appearance.  Foley catheter decompresses urinary bladder.  No mass, adenopathy, free air, hernia, or acute osseous findings.  IMPRESSION: Decreased size of small subcutaneous hematoma in the anterior LEFT pelvis.  Minimal diverticulitis changes of the distal sigmoid colon, little changed from previous exam.  Question cholelithiasis.  Tiny nonobstructing RIGHT renal calculus.  Enlargement of cardiac chambers with extensive atherosclerotic disease and subsegmental atelectasis RIGHT lower lobe.   Electronically Signed   By: Ulyses Southward M.D.   On: 01/21/2014 17:57   Dg Chest 2 View 01/20/2014   CLINICAL DATA:  Chest pain and shortness of breath.  EXAM: CHEST  2 VIEW   COMPARISON:  01/16/2014  FINDINGS: Enlargement of the cardiac silhouette is unchanged. Thoracic aorta is mildly tortuous. Thoracic aortic calcification is again seen. Eventration of the right hemidiaphragm is unchanged with associated right basilar atelectasis. The lungs are otherwise clear. There is no pleural effusion or pneumothorax. No acute osseous abnormality is seen.  IMPRESSION: No active cardiopulmonary disease.   Electronically Signed   By: Sebastian Ache   On: 01/20/2014 17:10  ease.  Stable compared to prior exam.   Electronically Signed   By: Drusilla Kanner M.D.   On: 01/12/2014 11:48   EKG: sinus brady, rate 51, normal intervals, QRS 102  TELEMETRY: sinus brady, intermittent afib with RVR, PVC's  A/P 1. Tachycardia/ bradycardia The patient has symptomatic paroxysmal atrial fibrillation.  She has elevated V rates during afib but also frequent sinus bradycardia with SOB and fatigue.  Though I would anticipate that the patient would benefit from pacing, I would like to first have her seen by the advanced heart failure team.  She appears to have severe valvular heart disease which I think should be further assessed at this time.  2. Atrial fibrillation She has severe LA enlargement as well as significant valvular disease.  I think that our ability to maintain sinus rhythm long term is low.  She has failed medical therapy amiodarone.  She is a poor candidate for tikosyn with renal failure.  She is also not a candidate for ablation. I would recommend rate control long term.  3. Valvular heart disease, acute on chronic combined systolic and diastolic CHF I am concerned that her overall prognosis is quite poor.  I would recommend evaluation at this time by the advanced CHF team.  She has a narrow QRS and therefore is not a candidate for CRT.  I would be reluctant to consider ICD in this patient given her overall prognosis.  I think that pacing backup for tachy/brady may be a more suitable  option for this patient.  I will defer this decision until after she is seen by the CHF team.

## 2014-01-26 NOTE — Interval H&P Note (Signed)
History and Physical Interval Note:  01/26/2014 1:52 PM  Marissa [name]  has presented today for surgery, with the diagnosis of brady cardia  The various methods of treatment have been discussed with the patient and family. After consideration of risks, benefits and other options for treatment, the patient has consented to  Procedure(s): PERMANENT PACEMAKER INSERTION (N/A) as a surgical intervention .  The patient's history has been reviewed, patient examined, no change in status, stable for surgery.  I have reviewed the patient's chart and labs.  Questions were answered to the patient's satisfaction.     Din Bookwalter

## 2014-01-26 NOTE — Progress Notes (Signed)
PT Cancellation Note  Patient Details Name: CAROLEE CHANNELL MRN: [id number] DOB: [birth year]   Cancelled Treatment:    Reason Eval/Treat Not Completed: Patient not medically ready. Pt underwent implantation of new dual chamber permanent pacemaker today and is currently on bed rest until tomorrow morning. Will defer PT eval until then.   Ruthann Cancer 01/26/2014, 4:31 PM  Ruthann Cancer, PT, DPT Acute Rehabilitation Services Pager: [phone]

## 2014-01-26 NOTE — Progress Notes (Signed)
Patient Name: Marissa [name]      SUBJECTIVE: without complaints   Past Medical History  Diagnosis Date  . Permanent atrial fibrillation     A.  Chronic Coumadin  . MVP (mitral valve prolapse)   . Essential hypertension, benign   . Nonischemic cardiomyopathy 2001    A.  07/23/11 - Echo: EF 35%;  B. 07/24/2011 - Cath: nonobs dzs ef 35%, 2+MR C. Echo 01/06/13: EF 40-45%, mild LVH, mlid MR, mod biatrial enlargment, mild RV dilatation, mod TR, PASP 54 mmHg  . Systolic CHF, chronic     A. Diag 07/2011, EF 35%  . Lumbar disc disease     Laminectomy 2001  . History of endometrial cancer   . Hyperlipidemia   . Rheumatoid arthritis     Treated in past with Remicade and developed vasculitis  . Gout   . PONV (postoperative nausea and vomiting)   . NSTEMI (non-ST elevated myocardial infarction) 12/2012  . GERD (gastroesophageal reflux disease)   . Coronary artery dissection 12/2012    95% OM3 hazy lesion on cath, medically managed  . Lumbar compression fracture 2014    L3, L4    Scheduled Meds:  Scheduled Meds: . atorvastatin  40 mg Oral q1800  . cefTRIAXone (ROCEPHIN)  IV  1 g Intravenous Daily  . febuxostat  40 mg Oral Daily  . gabapentin  300 mg Oral QHS  . hydroxychloroquine  200 mg Oral BID  . metoprolol succinate  25 mg Oral Q12H  . pantoprazole  40 mg Oral Daily  . potassium chloride  10 mEq Oral BID  . predniSONE  2 mg Oral Q breakfast  . sodium chloride  3 mL Intravenous Q12H  . sodium chloride  3 mL Intravenous Q12H  . warfarin  2.5 mg Oral ONCE-1800  . Warfarin - Pharmacist Dosing Inpatient   Does not apply q1800   Continuous Infusions:  sodium chloride, nitroGLYCERIN, ondansetron (ZOFRAN) IV, ondansetron, polyethylene glycol, sodium chloride, traMADol    PHYSICAL EXAM Filed Vitals:   01/25/14 1959 01/26/14 0645 01/26/14 0648 01/26/14 0958  BP: 100/61 88/50 111/86 113/76  Pulse: 54 74  94  Temp: 99 F (37.2 C) 99.1 F (37.3 C)  98.6 F (37 C)    TempSrc: Oral Oral  Oral  Resp: 18 17  18   Height:      Weight:   165 lb 12.6 oz (75.2 kg)   SpO2: 97% 94% 95% 94%    Well developed and nourished in no acute distress HENT normal Neck supple with JVP-flat Carotids brisk and full without bruits Clear Irregularly irregular rate and rhythm with controlled ventricular response, no murmurs or gallops Abd-soft with active BS without hepatomegaly No Clubbing cyanosis edema Skin-warm and dry A & Oriented  Grossly normal sensory and motor function   TELEMETRY: Reviewed telemetry pt in afib   Intake/Output Summary (Last 24 hours) at 01/26/14 1302 Last data filed at 01/26/14 1119  Gross per 24 hour  Intake 1132.42 ml  Output    425 ml  Net 707.42 ml    LABS: Basic Metabolic Panel:  Recent Labs Lab 01/20/14 1615 01/21/14 0640 01/23/14 1144 01/24/14 0405 01/25/14 0323 01/26/14 0355  NA 146 147 146 148* 147 144  K 4.1 4.3 3.8 3.5* 3.7 3.8  CL 105 107 103 104 101 100  CO2 28 29 28 31  32 31  GLUCOSE 121* 94 92 96 89 92  BUN 40* 42* 34* 29* 26*  26*  CREATININE 1.88* 2.00* 1.74* 1.57* 1.58* 1.74*  CALCIUM 10.3 10.0 9.3 8.9 9.3 9.2   Cardiac Enzymes: No results found for this basename: CKTOTAL, CKMB, CKMBINDEX, TROPONINI,  in the last 72 hours CBC:  Recent Labs Lab 01/20/14 1615 01/21/14 0640 01/26/14 0355  WBC 8.4 7.1 8.6  NEUTROABS 6.2  --   --   HGB 12.7 11.8* 12.0  HCT 40.1 37.0 38.9  MCV 92.0 91.6 91.1  PLT 212 190 210   PROTIME:  Recent Labs  01/24/14 0405 01/25/14 0323 01/26/14 0355  LABPROT 22.7* 19.5* 21.9*  INR 2.00* 1.65* 1.91*   Liver Function Tests: No results found for this basename: AST, ALT, ALKPHOS, BILITOT, PROT, ALBUMIN,  in the last 72 hours No results found for this basename: LIPASE, AMYLASE,  in the last 72 hours BNP: BNP (last 3 results)  Recent Labs  08/21/13 1220 01/20/14 1615 01/21/14 0640  PROBNP 8457.0* 24961.0* 17548.0*      ASSESSMENT AND PLAN:  Active  Problems:   Atrial fibrillation-persistent with some RVR   CHF (congestive heart failure)   Sinus bradycardia  Pt clearly needs pacing for primarily sinus node dysfunction   ahowever with very large atrium i suspect taht permanent atrial fibrillation is soon to be her companion and back up brady pacing will be less of an issue  That being the case I would thusNOT place CRT and NOT anticipate AV ablation.  Her low blood pressure is a little bit of a problem in this regard, so would place CRT generator in the event that rate controlliong drugs are limited by hypotension Have reviewed this with pt  The benefits and risks were reviewed including but not limited to death,  perforation, infection, lead dislodgement and device malfunction.  The patient understands agrees and is willing to proceed.   Signed, Sherryl Manges MD  01/26/2014

## 2014-01-26 NOTE — Progress Notes (Signed)
Advanced Heart Failure Rounding Note   Subjective:   PCP: Dr Roger Shelter Primary Cardiologist: Dr Theodoro Clock Marissa [name] is a 80 y.o. female with a past medical history significant for CAD (NSTEMI 01-2013, medical therapy recommended), hypertension, a fib in the past on chronic coumadin, rhuematoid arthritis on chronic prednisone, chronic back pain,  longstanding mixed cardiomyopathy, renal insufficiency, atrial fibrillation, and hyperlipidemia. Cardiac catheterization 01-26-13 demonstrated significant single vessel CAD with diffuse 95% stenosis in proximal OM3. She had VT arrest after cath thought to be from ischemia. It was felt that risks of PCI outweighed benefits and medical therapy was recommended. Had event montior 08/2013 with episodes of A fib RVR. Concern for tachy-mediated cardiomyopathy.  2 weeks ago started forteo which was later discontinued d/t possible dyspnea.   HF medications limited due pauses and bradycardia. She was on toprol and digoxin in the past. Prior to admit she was on lasix 40 mg bid. Admitted to [facility] with increased dyspnea and lower extremity edema. On admit  HR in 50s with intermittent tachycardia 130s. Transferred to [facility] for EP consult and consideration pacemaker versus defibrillator.   ECHO 01/21/14 EF 15-20% LA massively dilated, diffusely  Hypokinetic and RV mild-mod dilated. Mod MR, severe TR  Over the weekend started on diltiazem gtt for Afib RVR. Has been back and forth between NSR and atrial fibrillation, currently NSR. She diuresed well and is now off Lasix. Renal function stable. Denies SOB, orthopnea or CP.  Creatinine 1.8 >2.0 >1.74 Pro BNP 17548    Objective:   Weight Range:  Vital Signs:   Temp:  [98.8 F (37.1 C)-99.1 F (37.3 C)] 99.1 F (37.3 C) (07/20 0645) Pulse Rate:  [54-113] 74 (07/20 0645) Resp:  [17-18] 17 (07/20 0645) BP: (88-111)/(50-86) 111/86 mmHg (07/20 0648) SpO2:  [94 %-100 %] 95 % (07/20 0648) Weight:  [165 lb 12.6 oz (75.2  kg)] 165 lb 12.6 oz (75.2 kg) (07/20 0648) Last BM Date: 01/24/14  Weight change: Filed Weights   01/24/14 0503 01/25/14 0639 01/26/14 0648  Weight: 170 lb 10.2 oz (77.4 kg) 163 lb 11.2 oz (74.254 kg) 165 lb 12.6 oz (75.2 kg)    Intake/Output:   Intake/Output Summary (Last 24 hours) at 01/26/14 0731 Last data filed at 01/26/14 4496  Gross per 24 hour  Intake   1018 ml  Output    350 ml  Net    668 ml     Physical Exam: General:  Elderly appearing. No resp difficulty, lying flat in bed HEENT: normal Neck: supple. JVP 8. Carotids 2+ bilat; no bruits. No lymphadenopathy or thryomegaly appreciated. Cor: PMI nondisplaced. Regular rate & rhythm. No rubs, gallops or 2/6 HSM LLSB. Lungs: clear Abdomen: soft, nontender, + distended. No hepatosplenomegaly. No bruits or masses. Good bowel sounds. Extremities: no cyanosis, clubbing, rash, no edema Neuro: alert & orientedx3, cranial nerves grossly intact. moves all 4 extremities w/o difficulty. Affect pleasant  Telemetry: SR 80s  Labs: Basic Metabolic Panel:  Recent Labs Lab 01/21/14 0640 01/23/14 1144 01/24/14 0405 01/25/14 0323 01/26/14 0355  NA 147 146 148* 147 144  K 4.3 3.8 3.5* 3.7 3.8  CL 107 103 104 101 100  CO2 29 28 31  32 31  GLUCOSE 94 92 96 89 92  BUN 42* 34* 29* 26* 26*  CREATININE 2.00* 1.74* 1.57* 1.58* 1.74*  CALCIUM 10.0 9.3 8.9 9.3 9.2    Liver Function Tests:  Recent Labs Lab 01/20/14 1615  AST 25  ALT 19  ALKPHOS 93  BILITOT 0.5  PROT 7.0  ALBUMIN 3.6   No results found for this basename: LIPASE, AMYLASE,  in the last 168 hours No results found for this basename: AMMONIA,  in the last 168 hours  CBC:  Recent Labs Lab 01/20/14 1615 01/21/14 0640 01/26/14 0355  WBC 8.4 7.1 8.6  NEUTROABS 6.2  --   --   HGB 12.7 11.8* 12.0  HCT 40.1 37.0 38.9  MCV 92.0 91.6 91.1  PLT 212 190 210    Cardiac Enzymes:  Recent Labs Lab 01/20/14 1615  TROPONINI <0.30    BNP: BNP (last 3  results)  Recent Labs  08/21/13 1220 01/20/14 1615 01/21/14 0640  PROBNP 8457.0* 24961.0* 17548.0*     Other results:  EKG:   Imaging: Nm Myocar Multi W/spect W/wall Motion / Ef  01/24/2014   CLINICAL DATA:  Current history of hypertension, chronic systolic heart failure, nonischemic cardiomyopathy, tachy-brady syndrome, hyperlipidemia, and prior non ST elevated myocardial infarction, presenting with chest pain.  EXAM: MYOCARDIAL IMAGING WITH SPECT (REST AND PHARMACOLOGIC-STRESS)  GATED LEFT VENTRICULAR WALL MOTION STUDY  LEFT VENTRICULAR EJECTION FRACTION  TECHNIQUE: Standard myocardial SPECT imaging was performed after resting intravenous injection of 10 mCi Tc-67m sestamibi. Subsequently, intravenous infusion of Lexiscan was performed under the supervision of the Cardiology staff. At peak effect of the drug, 30 mCi Tc-46m sestamibi was injected intravenously and standard myocardial SPECT imaging was performed. Quantitative gated imaging was also performed to evaluate left ventricular wall motion, and estimate left ventricular ejection fraction.  COMPARISON:  None.  FINDINGS: Immediate post regadenoson images demonstrate diminished activity in the lateral wall and inferior wall relative to the remainder of the myocardium. Initial resting images suggest slight reversibility to the lateral wall anteriorly, but there is no evidence of inferior reversibility. Anterolateral reversibility is only slightly suggested by the computer generated polar map, as the reversibility perfusion percentage is 13.  Gated images demonstrate severe global hypokinesis.  Estimated QGS left ventricular ejection fraction measured 14%, with an end-diastolic volume of 134 ml and an end systolic volume of 115 ml.  IMPRESSION: 1. Inferior wall and lateral wall infarcts with minimal peri-infarct ischemia involving the lateral wall anteriorly. 2. Severe global left ventricular hypokinesis. 3. Estimated QGS ejection fraction 14%,  consistent with the history of cardiomyopathy.   Electronically Signed   By: Hulan Saas M.D.   On: 01/24/2014 13:01     Medications:     Scheduled Medications: . atorvastatin  40 mg Oral q1800  . cefTRIAXone (ROCEPHIN)  IV  1 g Intravenous Daily  . febuxostat  40 mg Oral Daily  . gabapentin  300 mg Oral QHS  . gentamicin irrigation  80 mg Irrigation On Call  . hydroxychloroquine  200 mg Oral BID  . pantoprazole  40 mg Oral Daily  . potassium chloride  10 mEq Oral BID  . predniSONE  2 mg Oral Q breakfast  . sodium chloride  3 mL Intravenous Q12H  . sodium chloride  3 mL Intravenous Q12H  . vancomycin  1,000 mg Intravenous On Call  . Warfarin - Pharmacist Dosing Inpatient   Does not apply q1800    Infusions: . sodium chloride 50 mL/hr at 01/26/14 0614  . diltiazem (CARDIZEM) infusion 10 mg/hr (01/25/14 1720)    PRN Medications: sodium chloride, nitroGLYCERIN, ondansetron (ZOFRAN) IV, ondansetron, polyethylene glycol, sodium chloride, traMADol   Assessment/Plan    1. Acute on chronic systolic CHF: EF 65-68%, down from 40-45%.  Possibly tachy-mediated cardiomyopathy, but has  history of CAD and MI.  Lexiscan Cardiolite this admission showed inferior wall and lateral wall infarcts with minimal peri-infarct ischemia involving lateral wall anteriorly.  - Volume status much improved and she is down a net negative of 7.5 liters. She is not on any diuretics currently, will restart lasix 40 mg PO BID and hold IV fluid. - Would hold off on LHC with CKD and only ischemia on Cardiolite.  - Would stop diltiazem, use Toprol XL for rate control given low EF.  2. Atrial fibrillation with RVR alternating with sinus brady and pauses: Tachy-brady syndrome.  She was initially taken off metoprolol and digoxin due to pauses and bradycardia. She was started on diltiazem gtt over weekend.  Rhythm alternates between NSR in 70s and atrial fibrillation in 100s.  She has failed amiodarone and is not a  Tikosyn candidate with poor renal fxn.  Agree that she would be unlikely to maintain NSR with dilated atria and MR/TR.  It appears that she will need backup pacing in order to manage her RVR and ?tachy-mediated CMP though I have not noted any significant bradycardia since she has been here.  I might consider AVN ablation with BiV pacing, especially if she will be pacing her RV a lot.  However, as noted, she has not had bradycardia this admission, therefore suspect that back up dual chamber pacing may be sufficient and can be altered in the future as needed.  I agree that ICD is probably not a good idea with overall prognosis.  Will stop diltiazem and start Toprol XL for rate control.  3. Valvular heart disease: Moderate MR, moderate-severe TR.  MR looks functional and may improve as cardiomyopathy is treated.  Would not surgically treat TR.  4. CKD: Creatinine improved since admission.   Marca Ancona 01/26/2014 8:06 AM

## 2014-01-26 NOTE — Op Note (Signed)
Procedure report  Procedure performed:  1. Implantation of new dual chamber permanent pacemaker 2. Fluoroscopy 3. Light sedation  Reason for procedure: Symptomatic bradycardia due to: Sinus node dysfunction Tachycardia-bradycardia syndrome Bradycardia due to necessary medications  Procedure performed by: Thurmon Fair, MD  Complications: None  Estimated blood loss: <10 mL  Medications administered during procedure: Vancomycin 1 g intravenously Lidocaine 1% 30 mL locally,  Fentanyl 50 mcg intravenously Versed 2 mg intravenously  Device details: Facilities manager. Jude Assurity model 2240 serial number [id number] Right atrial lead St[employer] Jude Tendril STS [id number] serial number [id number] Right ventricular lead St. Jude Tendril STS 2088TC-52 serial number [id number]  Procedure details:  After the risks and benefits of the procedure were discussed the patient provided informed consent and was brought to the cardiac cath lab in the fasting state. The patient was prepped and draped in usual sterile fashion. Local anesthesia with 1% lidocaine was administered to to the left infraclavicular area. A 5-6 cm horizontal incision was made parallel with and 2-3 cm caudal to the left clavicle. Using electrocautery and blunt dissection a prepectoral pocket was created down to the level of the pectoralis major muscle fascia. The pocket was carefully inspected for hemostasis. An antibiotic-soaked sponge was placed in the pocket.  Under fluoroscopic guidance and using the modified Seldinger technique 2 separate venipunctures were performed to access the left subclavian vein. No difficulty was encountered accessing the vein.  Two J-tip guidewires were subsequently exchanged for two 7 French safe sheaths.  Under fluoroscopic guidance the ventricular lead was advanced to level of the proximal to mid right ventricular septum and the active-fixation helix was deployed. This position was chosen to avoid  excessive pacing-related ventricular dyssynchrony. Prominent current of injury was seen. Satisfactory pacing and sensing parameters were recorded. There was no evidence of diaphragmatic stimulation at maximum device output. The safe sheath was peeled away and the lead was secured in place with 2-0 silk.  In similar fashion the right atrial lead was advanced to the level of the atrial appendage. The active-fixation helix was deployed. There was prominent current of injury. The rhythm was atrial fibrillation. Low voltage, but well defined fibrillation waves were recorded. Pacing threshold could not be tested. AOO pacing impedance was in the reference range There was no evidence of diaphragmatic stimulation with pacing at maximum device output. The safe sheath was peeled away and the lead was secured in place with 2-0 silk.  The antibiotic-soaked sponge was removed from the pocket. The pocket was flushed with copious amounts of antibiotic solution. Reinspection showed excellent hemostasis..  The ventricular lead was connected to the generator and appropriate ventricular pacing was seen. Subsequently the atrial lead was also connected. Repeat testing of the lead parameters later showed excellent values.  The entire system was then carefully inserted in the pocket with care been taking that the leads and device assumed a comfortable position without pressure on the incision. Great care was taken that the leads be located deep to the generator. The pocket was then closed in layers using 2 layers of 2-0 Vicryl and cutaneous staples, after which a sterile dressing was applied.  At the end of the procedure the following lead parameters were encountered:  Right atrial lead sensed P waves 0.5 mV, impedance 478 ohms, threshold not tested.  Right ventricular lead sensed R waves 5.8 mV, impedance 539ohms, threshold 0.5 V at 0.4 ms pulse width.  Thurmon Fair, MD, [facility] [phone]  office [phone] pager'

## 2014-01-27 ENCOUNTER — Inpatient Hospital Stay (HOSPITAL_COMMUNITY): Payer: Medicare Other

## 2014-01-27 LAB — BASIC METABOLIC PANEL
Anion gap: 12 (ref 5–15)
BUN: 29 mg/dL — AB (ref 6–23)
CHLORIDE: 100 meq/L (ref 96–112)
CO2: 30 mEq/L (ref 19–32)
Calcium: 9.4 mg/dL (ref 8.4–10.5)
Creatinine, Ser: 1.66 mg/dL — ABNORMAL HIGH (ref 0.50–1.10)
GFR calc Af Amer: 33 mL/min — ABNORMAL LOW (ref >90)
GFR calc non Af Amer: 28 mL/min — ABNORMAL LOW (ref >90)
Glucose, Bld: 90 mg/dL (ref 70–99)
POTASSIUM: 4.2 meq/L (ref 3.7–5.3)
Sodium: 142 mEq/L (ref 137–147)

## 2014-01-27 LAB — PROTIME-INR
INR: 2.39 — ABNORMAL HIGH (ref 0.00–1.49)
Prothrombin Time: 26.1 seconds — ABNORMAL HIGH (ref 11.6–15.2)

## 2014-01-27 MED ORDER — FUROSEMIDE 40 MG PO TABS
40.0000 mg | ORAL_TABLET | Freq: Two times a day (BID) | ORAL | Status: DC
  Administered 2014-01-27 (×2): 40 mg via ORAL
  Filled 2014-01-27 (×5): qty 1

## 2014-01-27 MED ORDER — CEPHALEXIN 500 MG PO CAPS
500.0000 mg | ORAL_CAPSULE | Freq: Two times a day (BID) | ORAL | Status: DC
  Administered 2014-01-27 – 2014-01-29 (×5): 500 mg via ORAL
  Filled 2014-01-27 (×6): qty 1

## 2014-01-27 MED ORDER — DIGOXIN 125 MCG PO TABS
0.1250 mg | ORAL_TABLET | Freq: Every day | ORAL | Status: AC
  Administered 2014-01-27: 0.125 mg via ORAL
  Filled 2014-01-27: qty 1

## 2014-01-27 MED ORDER — METOPROLOL TARTRATE 1 MG/ML IV SOLN: 2.5000 mg | INTRAVENOUS | Status: DC | PRN

## 2014-01-27 MED ORDER — DIGOXIN 125 MCG PO TABS
0.1250 mg | ORAL_TABLET | Freq: Every day | ORAL | Status: DC
  Administered 2014-01-27 – 2014-01-29 (×3): 0.125 mg via ORAL
  Filled 2014-01-27 (×3): qty 1

## 2014-01-27 MED ORDER — WARFARIN SODIUM 2.5 MG PO TABS
2.5000 mg | ORAL_TABLET | Freq: Once | ORAL | Status: AC
  Administered 2014-01-27: 2.5 mg via ORAL
  Filled 2014-01-27: qty 1

## 2014-01-27 NOTE — Evaluation (Signed)
Physical Therapy Evaluation Patient Details Name: Marissa [name] MRN: [id number] DOB: 22-Apr-1933 Today's Date: 01/27/2014   History of Present Illness  Pt is a 80 y/o female admitted with progressive SOB and intermittent chest pain. Pt diagnosed with bradycardia and underwent implantation of permanent pacemaker on 01/26/14.   Clinical Impression  Pt admitted with the above. Pt currently with functional limitations due to the deficits listed below (see PT Problem List). At the time of PT eval pt had difficulty transitioning to standing and maintaining static standing, even with mod-max assist from therapist. Pt will benefit from skilled PT to increase their independence and safety with mobility to allow discharge to the venue listed below.     Follow Up Recommendations SNF;Supervision/Assistance - 24 hour    Equipment Recommendations  None recommended by PT    Recommendations for Other Services       Precautions / Restrictions Precautions Precautions: Fall;ICD/Pacemaker Restrictions Weight Bearing Restrictions: No      Mobility  Bed Mobility               General bed mobility comments: Pt sitting in chair upon PT arrival  Transfers Overall transfer level: Needs assistance Equipment used: 1 person hand held assist Transfers: Sit to/from Stand Sit to Stand: Max assist         General transfer comment: Pt performed sit<>stand x2. Without use of LUE pt had difficulty powering up to full stand. VC's for anterior translation and improved posture in standing. While static standing, pt's knees were unstable in the sense that there appeared to be ligament laxity and knees were audibly grinding. Pt states she cannot tolerate standing any longer as she has had "problems with her knees lately".   Ambulation/Gait                Stairs            Wheelchair Mobility    Modified Rankin (Stroke Patients Only)       Balance Overall balance assessment: Needs  assistance Sitting-balance support: Feet supported;No upper extremity supported Sitting balance-Leahy Scale: Fair     Standing balance support: Single extremity supported Standing balance-Leahy Scale: Zero                               Pertinent Vitals/Pain Vitals stable throughout session.     Home Living Family/patient expects to be discharged to:: Private residence Living Arrangements: Alone Available Help at Discharge: Personal care attendant (5 days/week 11-5, sat-sun 8-1) Type of Home: House Home Access: Stairs to enter Entrance Stairs-Rails: Doctor, general practice of Steps: 3 Home Layout: One level Home Equipment: Walker - 2 wheels;Cane - single point;Bedside commode;Toilet riser      Prior Function Level of Independence: Needs assistance   Gait / Transfers Assistance Needed: RW all the time  ADL's / Homemaking Assistance Needed: Has someone bring bucket of hot water from the shower to sit on the [facility] and she sponge bathes with that. Has not gotten her tub bench yet.         Hand Dominance   Dominant Hand: Right    Extremity/Trunk Assessment   Upper Extremity Assessment: LUE deficits/detail       LUE Deficits / Details: In sling due to pacemaker placement yesterday afternoon.    Lower Extremity Assessment: Generalized weakness      Cervical / Trunk Assessment: Kyphotic  Communication   Communication: No difficulties  Cognition Arousal/Alertness:  Awake/alert Behavior During Therapy: WFL for tasks assessed/performed Overall Cognitive Status: Within Functional Limits for tasks assessed                      General Comments      Exercises        Assessment/Plan    PT Assessment Patient needs continued PT services  PT Diagnosis Difficulty walking;Generalized weakness   PT Problem List Decreased strength;Decreased range of motion;Decreased activity tolerance;Decreased balance;Decreased mobility;Decreased knowledge  of use of DME;Decreased safety awareness;Decreased knowledge of precautions  PT Treatment Interventions DME instruction;Gait training;Stair training;Functional mobility training;Therapeutic activities;Therapeutic exercise;Neuromuscular re-education;Patient/family education   PT Goals (Current goals can be found in the Care Plan section) Acute Rehab PT Goals Patient Stated Goal: To return home safely PT Goal Formulation: With patient Time For Goal Achievement: 02/10/14 Potential to Achieve Goals: Good    Frequency Min 2X/week   Barriers to discharge        Co-evaluation               End of Session Equipment Utilized During Treatment: Gait belt Activity Tolerance: Patient limited by fatigue;Patient limited by pain (in knees) Patient left: in chair;with call bell/phone within reach Nurse Communication: Mobility status         Time: 9826-4158 PT Time Calculation (min): 24 min   Charges:   PT Evaluation $Initial PT Evaluation Tier I: 1 Procedure PT Treatments $Therapeutic Activity: 8-22 mins   PT G Codes:          Ruthann Cancer 01/27/2014, 11:30 AM  Ruthann Cancer, PT, DPT Acute Rehabilitation Services Pager: [phone]

## 2014-01-27 NOTE — Progress Notes (Signed)
Advanced Heart Failure Rounding Note   Subjective:   PCP: Dr Roger Shelter Primary Cardiologist: Dr Theodoro Clock Marissa [name] is a 80 y.o. female with a past medical history significant for CAD (NSTEMI 01-2013, medical therapy recommended), hypertension, a fib in the past on chronic coumadin, rhuematoid arthritis on chronic prednisone, chronic back pain,  longstanding mixed cardiomyopathy, renal insufficiency, atrial fibrillation, and hyperlipidemia. Cardiac catheterization 01-26-13 demonstrated significant single vessel CAD with diffuse 95% stenosis in proximal OM3. She had VT arrest after cath thought to be from ischemia. It was felt that risks of PCI outweighed benefits and medical therapy was recommended. Had event montior 08/2013 with episodes of A fib RVR. Concern for tachy-mediated cardiomyopathy.  2 weeks ago started forteo which was later discontinued d/t possible dyspnea.   HF medications limited due pauses and bradycardia. She was on toprol and digoxin in the past. Prior to admit she was on lasix 40 mg bid. Admitted to [facility] with increased dyspnea and lower extremity edema. On admit  HR in 50s with intermittent tachycardia 130s. Transferred to [facility] for EP consult and consideration pacemaker versus defibrillator.   ECHO 01/21/14 EF 15-20% LA massively dilated, diffusely  Hypokinetic and RV mild-mod dilated. Mod MR, severe TR  Yesterday underwent dual chamber pacemaker which she tolerated well. In NSR this am. Denis SOB, orthopnea or CP. Weight increased likely not accurate.    Creatinine 1.8 >2.0 >1.74>1.66 Pro BNP 17548 INR 2.39   Objective:   Weight Range:  Vital Signs:   Temp:  [98 F (36.7 C)-98.6 F (37 C)] 98 F (36.7 C) (07/21 0641) Pulse Rate:  [80-109] 95 (07/21 0641) Resp:  [18] 18 (07/21 0641) BP: (94-113)/(41-76) 98/69 mmHg (07/21 0641) SpO2:  [94 %-98 %] 95 % (07/21 0641) Weight:  [176 lb 9.4 oz (80.1 kg)] 176 lb 9.4 oz (80.1 kg) (07/21 0641) Last BM Date:  01/24/14  Weight change: Filed Weights   01/25/14 0639 01/26/14 0648 01/27/14 0641  Weight: 163 lb 11.2 oz (74.254 kg) 165 lb 12.6 oz (75.2 kg) 176 lb 9.4 oz (80.1 kg)    Intake/Output:   Intake/Output Summary (Last 24 hours) at 01/27/14 0834 Last data filed at 01/27/14 0700  Gross per 24 hour  Intake   1233 ml  Output    725 ml  Net    508 ml     Physical Exam: General:  Elderly appearing. No resp difficulty, lying flat in bed HEENT: normal Neck: supple. JVP 8. Carotids 2+ bilat; no bruits. No lymphadenopathy or thryomegaly appreciated. Cor: PMI nondisplaced. Regular rate & rhythm. No rubs, gallops or 2/6 HSM LLSB. Lungs: clear Abdomen: soft, nontender, + distended. No hepatosplenomegaly. No bruits or masses. Good bowel sounds. Extremities: no cyanosis, clubbing, rash, no edema Neuro: alert & orientedx3, cranial nerves grossly intact. moves all 4 extremities w/o difficulty. Affect pleasant  Telemetry: SR 80s  Labs: Basic Metabolic Panel:  Recent Labs Lab 01/23/14 1144 01/24/14 0405 01/25/14 0323 01/26/14 0355 01/27/14 0602  NA 146 148* 147 144 142  K 3.8 3.5* 3.7 3.8 4.2  CL 103 104 101 100 100  CO2 28 31 32 31 30  GLUCOSE 92 96 89 92 90  BUN 34* 29* 26* 26* 29*  CREATININE 1.74* 1.57* 1.58* 1.74* 1.66*  CALCIUM 9.3 8.9 9.3 9.2 9.4    Liver Function Tests:  Recent Labs Lab 01/20/14 1615  AST 25  ALT 19  ALKPHOS 93  BILITOT 0.5  PROT 7.0  ALBUMIN 3.6   No  results found for this basename: LIPASE, AMYLASE,  in the last 168 hours No results found for this basename: AMMONIA,  in the last 168 hours  CBC:  Recent Labs Lab 01/20/14 1615 01/21/14 0640 01/26/14 0355  WBC 8.4 7.1 8.6  NEUTROABS 6.2  --   --   HGB 12.7 11.8* 12.0  HCT 40.1 37.0 38.9  MCV 92.0 91.6 91.1  PLT 212 190 210    Cardiac Enzymes:  Recent Labs Lab 01/20/14 1615  TROPONINI <0.30    BNP: BNP (last 3 results)  Recent Labs  08/21/13 1220 01/20/14 1615  01/21/14 0640  PROBNP 8457.0* 24961.0* 17548.0*     Other results:  EKG:   Imaging: Dg Chest 2 View  01/27/2014   CLINICAL DATA:  Pacemaker insertion.  EXAM: CHEST  2 VIEW  COMPARISON:  01/20/2014 the permanent left-sided pacemaker is in good position with the right atrial and ventricular wires well position. No complicating features are demonstrated. The heart is enlarged but stable. There is tortuosity and ectasia of the thoracic aorta. Stable eventration of the right hemidiaphragm. No pleural effusion or pneumothorax.  FINDINGS: The heart size and mediastinal contours are within normal limits. Both lungs are clear. The visualized skeletal structures are unremarkable.  IMPRESSION: Pacer wires in good position without complicating features.  Stable cardiac enlargement.   Electronically Signed   By: Loralie Champagne M.D.   On: 01/27/2014 07:54     Medications:     Scheduled Medications: . atorvastatin  40 mg Oral q1800  . cefTRIAXone (ROCEPHIN)  IV  1 g Intravenous Daily  . febuxostat  40 mg Oral Daily  . gabapentin  300 mg Oral QHS  . hydroxychloroquine  200 mg Oral BID  . metoprolol succinate  25 mg Oral Q12H  . pantoprazole  40 mg Oral Daily  . potassium chloride  10 mEq Oral BID  . predniSONE  2 mg Oral Q breakfast  . sodium chloride  3 mL Intravenous Q12H  . sodium chloride  3 mL Intravenous Q12H  . Warfarin - Pharmacist Dosing Inpatient   Does not apply q1800    Infusions: . sodium chloride 50 mL/hr at 01/26/14 1615    PRN Medications: sodium chloride, acetaminophen, nitroGLYCERIN, ondansetron (ZOFRAN) IV, ondansetron, polyethylene glycol, sodium chloride, traMADol   Assessment/Plan    1. Acute on chronic systolic CHF: EF 15-17%, down from 40-45%.  Possibly tachy-mediated cardiomyopathy, but has history of CAD and MI.  Lexiscan Cardiolite this admission showed inferior wall and lateral wall infarcts with minimal peri-infarct ischemia involving lateral wall  anteriorly. - Stable overnight. Weight is up on chart but she does not appear volume overloaded. Will restart lasix 40 mg PO BID. - Would hold off on LHC with CKD and no ischemia on Cardiolite.  - SBP 90s. Will continue Toprol 25 mg bid and add back digoxin 0.125 mg daily.  - She is not currently on her ARB would like to restart eventually but will hold off currently with elevated creatinine.  - Consult cardiac rehab & PT. 2. Atrial fibrillation with RVR alternating with sinus brady and pauses: Tachy-brady syndrome.  She was initially taken off metoprolol and digoxin due to pauses and bradycardia. She was started on diltiazem gtt over weekend which was discontinued.  Rhythm alternates between NSR in 70s and atrial fibrillation in 90s-100s, mainly atrial fibrillation at this point.  She has failed amiodarone and is not a Tikosyn candidate with poor renal fxn.  Agree that she would be  unlikely to maintain NSR with dilated atria and MR/TR.  Dual chamber PCM placed yesterday for backup pacing in order to rate control her to manage her RVR and ?tachy-mediated CMP.  If she starts RV pacing a lot then would need to consider CRT upgrade in the future. She does not have an ICD d/t her overall poor prognosis.  Rate controlled now with Toprol XL, plan to add digoxin 0.125 mg daily.  3. Valvular heart disease: Moderate MR, moderate-severe TR.  MR looks functional and may improve as cardiomyopathy is treated.  Would not surgically treat TR.  4. CKD: Creatinine improved since admission. Continue to follow.   Likely can go home tomorrow with follow up in HF clinic next week.   Ulla Potash B NP-C 01/27/2014 8:34 AM  Patient seen with NP, agree with the above note.  Now has backup pacing.  Hopefully will not need this much as I suspect that she is going to stay mostly in atrial fibrillation.  If she has a lot of RV pacing in the future, could consider CRT upgrade.  For now, would continue Toprol XL and add back  digoxin to aid with rate control (possible tachy-mediated CMP).  Can restart po diuretic.  Will need to mobilize today with PT/rehab (feels weak), possible discharge in am.   Marca Ancona 01/27/2014 9:18 AM

## 2014-01-27 NOTE — Progress Notes (Signed)
Patient Name: Marissa [name]      SUBJECTIVE: feels short of breath with exertion  Past Medical History  Diagnosis Date  . Permanent atrial fibrillation     A.  Chronic Coumadin  . MVP (mitral valve prolapse)   . Essential hypertension, benign   . Nonischemic cardiomyopathy 2001    A.  07/23/11 - Echo: EF 35%;  B. 07/24/2011 - Cath: nonobs dzs ef 35%, 2+MR C. Echo 01/06/13: EF 40-45%, mild LVH, mlid MR, mod biatrial enlargment, mild RV dilatation, mod TR, PASP 54 mmHg  . Systolic CHF, chronic     A. Diag 07/2011, EF 35%  . Lumbar disc disease     Laminectomy 2001  . History of endometrial cancer   . Hyperlipidemia   . Rheumatoid arthritis     Treated in past with Remicade and developed vasculitis  . Gout   . PONV (postoperative nausea and vomiting)   . NSTEMI (non-ST elevated myocardial infarction) 12/2012  . GERD (gastroesophageal reflux disease)   . Coronary artery dissection 12/2012    95% OM3 hazy lesion on cath, medically managed  . Lumbar compression fracture 2014    L3, L4    Scheduled Meds:  Scheduled Meds: . atorvastatin  40 mg Oral q1800  . cephALEXin  500 mg Oral Q12H  . digoxin  0.125 mg Oral Daily  . febuxostat  40 mg Oral Daily  . furosemide  40 mg Oral BID  . gabapentin  300 mg Oral QHS  . hydroxychloroquine  200 mg Oral BID  . metoprolol succinate  25 mg Oral Q12H  . pantoprazole  40 mg Oral Daily  . potassium chloride  10 mEq Oral BID  . predniSONE  2 mg Oral Q breakfast  . sodium chloride  3 mL Intravenous Q12H  . sodium chloride  3 mL Intravenous Q12H  . warfarin  2.5 mg Oral ONCE-1800  . Warfarin - Pharmacist Dosing Inpatient   Does not apply q1800   Continuous Infusions: . sodium chloride 50 mL/hr at 01/26/14 1615   sodium chloride, acetaminophen, nitroGLYCERIN, ondansetron (ZOFRAN) IV, ondansetron, polyethylene glycol, sodium chloride, traMADol    PHYSICAL EXAM Filed Vitals:   01/26/14 1830 01/26/14 2042 01/27/14 0641 01/27/14  0846  BP: 99/67 98/63 98/69    Pulse: 105 109 95   Temp:  98.4 F (36.9 C) 98 F (36.7 C)   TempSrc:  Oral Oral   Resp:  18 18   Height:      Weight:   176 lb 9.4 oz (80.1 kg) 167 lb 15.9 oz (76.2 kg)  SpO2:  98% 95%     Well developed and nourished in mild resp distress HENT normal Neck suppleClear Irregularly irregular rate and rhythm with rapid ventricular response, no murmurs or gallops Abd-soft with active BS without hepatomegaly No Clubbing cyanosis  tr+ edema Skin-warm and dry A & Oriented  Grossly normal sensory and motor function   TELEMETRY: Reviewed telemetry pt in *AFib w RVR    Intake/Output Summary (Last 24 hours) at 01/27/14 1159 Last data filed at 01/27/14 0847  Gross per 24 hour  Intake    816 ml  Output    650 ml  Net    166 ml    LABS: Basic Metabolic Panel:  Recent Labs Lab 01/20/14 1615 01/21/14 0640 01/23/14 1144 01/24/14 0405 01/25/14 0323 01/26/14 0355 01/27/14 0602  NA 146 147 146 148* 147 144 142  K 4.1 4.3 3.8 3.5* 3.7  3.8 4.2  CL 105 107 103 104 101 100 100  CO2 28 29 28 31  32 31 30  GLUCOSE 121* 94 92 96 89 92 90  BUN 40* 42* 34* 29* 26* 26* 29*  CREATININE 1.88* 2.00* 1.74* 1.57* 1.58* 1.74* 1.66*  CALCIUM 10.3 10.0 9.3 8.9 9.3 9.2 9.4   Cardiac Enzymes: No results found for this basename: CKTOTAL, CKMB, CKMBINDEX, TROPONINI,  in the last 72 hours CBC:  Recent Labs Lab 01/20/14 1615 01/21/14 0640 01/26/14 0355  WBC 8.4 7.1 8.6  NEUTROABS 6.2  --   --   HGB 12.7 11.8* 12.0  HCT 40.1 37.0 38.9  MCV 92.0 91.6 91.1  PLT 212 190 210   PROTIME:  Recent Labs  01/25/14 0323 01/26/14 0355 01/27/14 0602  LABPROT 19.5* 21.9* 26.1*  INR 1.65* 1.91* 2.39*   Liver Function Tests: No results found for this basename: AST, ALT, ALKPHOS, BILITOT, PROT, ALBUMIN,  in the last 72 hours No results found for this basename: LIPASE, AMYLASE,  in the last 72 hours BNP: BNP (last 3 results)  Recent Labs  08/21/13 1220  01/20/14 1615 01/21/14 0640  PROBNP 8457.0* 24961.0* 17548.0*   D-Dimer: No results found for this basename: DDIMER,  in the last 72 hours Hemoglobin A1C: No results found for this basename: HGBA1C,  in the last 72 hours Fasting Lipid Panel: No results found for this basename: CHOL, HDL, LDLCALC, TRIG, CHOLHDL, LDLDIRECT,  in the last 72 hours Thyroid Function Tests: No results found for this basename: TSH, T4TOTAL, FREET3, T3FREE, THYROIDAB,  in the last 72 hours Anemia Panel: No results found for this basename: VITAMINB12, FOLATE, FERRITIN, TIBC, IRON, RETICCTPCT,  in the last 72 hours   Device Interrogation:*normal device function  CXR normal lead placement   ASSESSMENT AND PLAN:  Active Problems:   Atrial fibrillation-persistent with some RVR   CHF (congestive heart failure)   Sinus bradycardia d/c IV fluids; ?? decrease lasix to once daily now that IV fluids are off Agree with dig  Will give extra dose today BB uptitration may be limited by blood pressure  Signed, 01/23/14 MD  01/27/2014

## 2014-01-27 NOTE — Progress Notes (Signed)
ANTICOAGULATION CONSULT NOTE - FOLLOW UP  Pharmacy Consult:  Coumadin Indication: atrial fibrillation  Allergies  Allergen Reactions  . Codeine Other (See Comments)    Makes patient sleepy.  Marland Kitchen Penicillins Rash    Has tolerated ceftriaxone    Patient Measurements: Height: 5\' 5"  (165.1 cm) Weight: 176 lb 9.4 oz (80.1 kg) IBW/kg (Calculated) : 57  Vital Signs: Temp: 98 F (36.7 C) (07/21 0641) Temp src: Oral (07/21 0641) BP: 98/69 mmHg (07/21 0641) Pulse Rate: 95 (07/21 0641)  Labs:  Recent Labs  01/25/14 0323 01/26/14 0355 01/27/14 0602  HGB  --  12.0  --   HCT  --  38.9  --   PLT  --  210  --   LABPROT 19.5* 21.9* 26.1*  INR 1.65* 1.91* 2.39*  CREATININE 1.58* 1.74* 1.66*    Estimated Creatinine Clearance: 29.2 ml/min (by C-G formula based on Cr of 1.66).  Assessment: 80 year old female with history of Afib on chronic Coumadin. EP wanted to continue low dose Coumadin and keep her INR < 2.5 for possible pacer placement - done 7/20. 8/20 INR increased from 1.65> 1.91 >2.39. CBC stable, no bleeding noted.  PTA Coumadin dose: 5mg  daily except 2.5mg  on Mon / Thurs, last home dose 7/13  Goal of Therapy:  INR goal 2-3  Plan:  - Coumadin 2.5mg  PO today - Daily PT / INR  Sat Pharm.D. CPP, BCPS Clinical Pharmacist [phone] 01/27/2014 8:42 AM

## 2014-01-27 NOTE — Progress Notes (Addendum)
CARDIAC REHAB PHASE I   PRE:  Rate/Rhythm: 109    BP:  Supine:   Sitting: 98/68  Standing: 115/69   SaO2: 92 RA  MODE:  Ambulation: few steps ft   POST:  Rate/Rhythm: 105  BP:  Supine:   Sitting: 106/70  Standing:    SaO2: 93 RA 1121-1140 On arrival pt in recliner, states that she tried to walk earlier and was unable to stand. Assisted X 2 used walker and gait belt to ambulate. Pt able to stand with use of gait belt. She was only able to take a few steps and had to get recliner and sat pt down. She is weak and leans forward with walking. As she tires she leans further more. Pt back to recliner after walk with call light in reach and her home caregiver present. Pt is agreeable to Rehab. She does c/o of some SOB with sitting and with exertion.   Melina Copa RN 01/27/2014 11:37 AM

## 2014-01-27 NOTE — Progress Notes (Signed)
Followed up with patient at bedside with Joy (her paid caregiver).  No new concerns where expressed by the patient regarding discharge.  She is considering short term rehabilitation if needed.  Writer did encourage her to strongly consider this option for safety because she does not have 24 hour assistance at home.  Provided my contact information to patient.  Will continue to monitor.  Of note, [facility] Care Management services does not replace or interfere with any services that are arranged by inpatient case management or social work.  For additional questions or referrals please contact Anibal Henderson BSN RN [facility] Liaison at [phone].

## 2014-01-28 LAB — BASIC METABOLIC PANEL
ANION GAP: 11 (ref 5–15)
BUN: 30 mg/dL — ABNORMAL HIGH (ref 6–23)
CHLORIDE: 101 meq/L (ref 96–112)
CO2: 30 mEq/L (ref 19–32)
Calcium: 9.4 mg/dL (ref 8.4–10.5)
Creatinine, Ser: 1.9 mg/dL — ABNORMAL HIGH (ref 0.50–1.10)
GFR calc Af Amer: 28 mL/min — ABNORMAL LOW (ref >90)
GFR calc non Af Amer: 24 mL/min — ABNORMAL LOW (ref >90)
Glucose, Bld: 83 mg/dL (ref 70–99)
Potassium: 3.9 mEq/L (ref 3.7–5.3)
Sodium: 142 mEq/L (ref 137–147)

## 2014-01-28 LAB — PROTIME-INR
INR: 2.33 — AB (ref 0.00–1.49)
Prothrombin Time: 25.6 seconds — ABNORMAL HIGH (ref 11.6–15.2)

## 2014-01-28 MED ORDER — WARFARIN SODIUM 2.5 MG PO TABS
2.5000 mg | ORAL_TABLET | Freq: Once | ORAL | Status: AC
  Administered 2014-01-28: 2.5 mg via ORAL
  Filled 2014-01-28: qty 1

## 2014-01-28 MED ORDER — METOPROLOL SUCCINATE ER 25 MG PO TB24
37.5000 mg | ORAL_TABLET | Freq: Two times a day (BID) | ORAL | Status: DC
  Administered 2014-01-28 – 2014-01-29 (×3): 37.5 mg via ORAL
  Filled 2014-01-28 (×6): qty 1

## 2014-01-28 NOTE — Progress Notes (Signed)
Advanced Heart Failure Rounding Note   Subjective:   PCP: Dr Roger Shelter Primary Cardiologist: Dr Theodoro Clock Marissa [name] is a 80 y.o. female with a past medical history significant for CAD (NSTEMI 01-2013, medical therapy recommended), hypertension, a fib in the past on chronic coumadin, rhuematoid arthritis on chronic prednisone, chronic back pain,  longstanding mixed cardiomyopathy, renal insufficiency, atrial fibrillation, and hyperlipidemia. Cardiac catheterization 01-26-13 demonstrated significant single vessel CAD with diffuse 95% stenosis in proximal OM3. She had VT arrest after cath thought to be from ischemia. It was felt that risks of PCI outweighed benefits and medical therapy was recommended. Had event montior 08/2013 with episodes of A fib RVR. Concern for tachy-mediated cardiomyopathy.  2 weeks ago started forteo which was later discontinued d/t possible dyspnea.   HF medications limited due pauses and bradycardia. She was on toprol and digoxin in the past. Prior to admit she was on lasix 40 mg bid. Admitted to [facility] with increased dyspnea and lower extremity edema. On admit  HR in 50s with intermittent tachycardia 130s. Transferred to [facility] for EP consult and consideration pacemaker versus defibrillator.   ECHO 01/21/14 EF 15-20% LA massively dilated, diffusely  Hypokinetic and RV mild-mod dilated. Mod MR, severe TR  Underwent dual chamber pacemaker (7/20) which she tolerated well. SOB with minimal exertion and needs 2x assist. Restated on PO diuretics yesterday. Denies CP or orthopnea.    Creatinine 1.8 >2.0 >1.74>1.66>1.9 INR 2.39>2.33   Objective:   Weight Range:  Vital Signs:   Temp:  [97.8 F (36.6 C)-98.6 F (37 C)] 98.3 F (36.8 C) (07/22 0444) Pulse Rate:  [71-127] 71 (07/22 0444) Resp:  [18-20] 18 (07/22 0444) BP: (102-142)/(54-69) 102/66 mmHg (07/22 0444) SpO2:  [94 %-97 %] 97 % (07/22 0444) Weight:  [167 lb 15.9 oz (76.2 kg)-168 lb 3.2 oz (76.295 kg)] 168 lb 3.2 oz  (76.295 kg) (07/22 0444) Last BM Date: 01/24/14  Weight change: Filed Weights   01/27/14 0641 01/27/14 0846 01/28/14 0444  Weight: 176 lb 9.4 oz (80.1 kg) 167 lb 15.9 oz (76.2 kg) 168 lb 3.2 oz (76.295 kg)    Intake/Output:   Intake/Output Summary (Last 24 hours) at 01/28/14 0756 Last data filed at 01/28/14 0445  Gross per 24 hour  Intake    720 ml  Output    775 ml  Net    -55 ml     Physical Exam: General:  Elderly appearing. No resp difficulty, lying flat in bed HEENT: normal Neck: supple. JVP 7. Carotids 2+ bilat; no bruits. No lymphadenopathy or thryomegaly appreciated. Cor: PMI nondisplaced. Regular rate & rhythm. No rubs, gallops or 2/6 HSM LLSB. Lungs: clear Abdomen: soft, nontender, + distended. No hepatosplenomegaly. No bruits or masses. Good bowel sounds. Extremities: no cyanosis, clubbing, rash, no edema Neuro: alert & orientedx3, cranial nerves grossly intact. moves all 4 extremities w/o difficulty. Affect pleasant  Telemetry: Afib 80s  Labs: Basic Metabolic Panel:  Recent Labs Lab 01/24/14 0405 01/25/14 0323 01/26/14 0355 01/27/14 0602 01/28/14 0415  NA 148* 147 144 142 142  K 3.5* 3.7 3.8 4.2 3.9  CL 104 101 100 100 101  CO2 31 32 31 30 30   GLUCOSE 96 89 92 90 83  BUN 29* 26* 26* 29* 30*  CREATININE 1.57* 1.58* 1.74* 1.66* 1.90*  CALCIUM 8.9 9.3 9.2 9.4 9.4    Liver Function Tests: No results found for this basename: AST, ALT, ALKPHOS, BILITOT, PROT, ALBUMIN,  in the last 168 hours No results found  for this basename: LIPASE, AMYLASE,  in the last 168 hours No results found for this basename: AMMONIA,  in the last 168 hours  CBC:  Recent Labs Lab 01/26/14 0355  WBC 8.6  HGB 12.0  HCT 38.9  MCV 91.1  PLT 210    Cardiac Enzymes: No results found for this basename: CKTOTAL, CKMB, CKMBINDEX, TROPONINI,  in the last 168 hours  BNP: BNP (last 3 results)  Recent Labs  08/21/13 1220 01/20/14 1615 01/21/14 0640  PROBNP 8457.0*  24961.0* 17548.0*     Other results:  EKG:   Imaging: Dg Chest 2 View  01/27/2014   CLINICAL DATA:  Pacemaker insertion.  EXAM: CHEST  2 VIEW  COMPARISON:  01/20/2014 the permanent left-sided pacemaker is in good position with the right atrial and ventricular wires well position. No complicating features are demonstrated. The heart is enlarged but stable. There is tortuosity and ectasia of the thoracic aorta. Stable eventration of the right hemidiaphragm. No pleural effusion or pneumothorax.  FINDINGS: The heart size and mediastinal contours are within normal limits. Both lungs are clear. The visualized skeletal structures are unremarkable.  IMPRESSION: Pacer wires in good position without complicating features.  Stable cardiac enlargement.   Electronically Signed   By: Loralie Champagne M.D.   On: 01/27/2014 07:54     Medications:     Scheduled Medications: . atorvastatin  40 mg Oral q1800  . cephALEXin  500 mg Oral Q12H  . digoxin  0.125 mg Oral Daily  . febuxostat  40 mg Oral Daily  . furosemide  40 mg Oral BID  . gabapentin  300 mg Oral QHS  . hydroxychloroquine  200 mg Oral BID  . metoprolol succinate  25 mg Oral Q12H  . pantoprazole  40 mg Oral Daily  . potassium chloride  10 mEq Oral BID  . predniSONE  2 mg Oral Q breakfast  . sodium chloride  3 mL Intravenous Q12H  . sodium chloride  3 mL Intravenous Q12H  . Warfarin - Pharmacist Dosing Inpatient   Does not apply q1800    Infusions:    PRN Medications: sodium chloride, acetaminophen, metoprolol, nitroGLYCERIN, ondansetron (ZOFRAN) IV, ondansetron, polyethylene glycol, sodium chloride, traMADol   Assessment/Plan    1. Acute on chronic systolic CHF: EF 55-37%, down from 40-45%.  Possibly tachy-mediated cardiomyopathy, but has history of CAD and MI.  Lexiscan Cardiolite this admission showed inferior wall and lateral wall infarcts with minimal peri-infarct ischemia involving lateral wall anteriorly. - Stable overnight.  Sluggish UOP and renal function trending up. She does not appear to have any volume on board. Will hold diuretics today and then decrease to 60 mg daily. - Would hold off on LHC with CKD and no ischemia on Cardiolite.  - SBP 100s. Will continue Toprol 25 mg bid and digoxin 0.125 mg daily. Will need digoxin level checked in 5 days.  - She is not currently on her ARB would like to restart eventually but will hold off currently with elevated creatinine.  - PT recommends SNF or 24 hour supervision.  2. Atrial fibrillation with RVR alternating with sinus brady and pauses: Tachy-brady syndrome.  She was initially taken off metoprolol and digoxin due to pauses and bradycardia. She was started on diltiazem gtt over weekend which was discontinued.  Rhythm alternates between NSR in 70s and atrial fibrillation in 90s-100s, mainly atrial fibrillation at this point.  She has failed amiodarone and is not a Tikosyn candidate with poor renal fxn.  Agree  that she would be unlikely to maintain NSR with dilated atria and MR/TR.  Dual chamber PCM placed 7/20 for backup pacing in order to rate control her to manage her RVR and ?tachy-mediated CMP.  If she starts RV pacing a lot then would need to consider CRT upgrade in the future. She does not have an ICD d/t her overall poor prognosis.  Rate controlled now with Toprol XL and digoxin 0.125 mg daily.  3. Valvular heart disease: Moderate MR, moderate-severe TR.  MR looks functional and may improve as cardiomyopathy is treated.  Would not surgically treat TR.  4. CKD: Creatinine slightly elevated this am. As above will hold diuretics and restart at lower dose tomorrow, lasix 60 mg daily.  From HF standpoint could be discharged today if able to have 24 hour supervision. Will need follow up in HF clinic next week, will place appointment on chart.  Meds: Toprol 25 mg BID Digoxin 0.125 mg Lasix 60 mg daily   Aundria Rud NP-C 01/28/2014 7:56 AM  Patient seen with NP,  agree with the above note.  Will need rehab stay prior to going home, will have social work involvement for this.  Stable from CHF standpoint, Lasix decreased to once a day today.  She is currently in atrial fibrillation, rate is controlled.  Dr. Graciela Husbands saw this morning, reprogramming PCM to minimize RV pacing.   Marca Ancona 01/28/2014

## 2014-01-28 NOTE — Progress Notes (Signed)
Alert and oriented x 3. Skin warm and dry. Pt has not had ted hose on today because they were hurting her legs. Continues to be SOB with exertion. Ambulates in room from bed to bedside commode. No concerns at this time.

## 2014-01-28 NOTE — Progress Notes (Signed)
ANTICOAGULATION CONSULT NOTE - Follow Up Consult  Pharmacy Consult for Coumadin Indication: atrial fibrillation  Allergies  Allergen Reactions  . Codeine Other (See Comments)    Makes patient sleepy.  Marland Kitchen Penicillins Rash    Has tolerated ceftriaxone    Patient Measurements: Height: 5\' 5"  (165.1 cm) Weight: 168 lb 3.2 oz (76.295 kg) (scale C) IBW/kg (Calculated) : 57  Vital Signs: Temp: 98.3 F (36.8 C) (07/22 0444) Temp src: Oral (07/22 0444) BP: 102/66 mmHg (07/22 0444) Pulse Rate: 71 (07/22 0444)  Labs:  Recent Labs  01/26/14 0355 01/27/14 0602 01/28/14 0415  HGB 12.0  --   --   HCT 38.9  --   --   PLT 210  --   --   LABPROT 21.9* 26.1* 25.6*  INR 1.91* 2.39* 2.33*  CREATININE 1.74* 1.66* 1.90*    Estimated Creatinine Clearance: 24.9 ml/min (by C-G formula based on Cr of 1.9).  Assessment: 80yof continues on coumadin for afib. EP wanted to continue low dose coumadin and keep her INR < 2.5 for possible pacer placement - done 7/20 - now INR goal increased back to 2-3. INR therapeutic today at 2.33 and stabilizing with 2.5mg  doses. No bleeding reported.  Home dose: 5mg  daily except 2.5mg  Mon/Thurs  Goal of Therapy:  INR 2-3 Monitor platelets by anticoagulation protocol: Yes   Plan:  1) Coumadin 2.5mg  x 1 2) INR in AM 3) May be able to resume home regimen tomorrow  8/20 01/28/2014,10:01 AM

## 2014-01-28 NOTE — Progress Notes (Signed)
Patient Name: Marissa [name]      SUBJECTIVE: feels short of breath with exertion  Past Medical History  Diagnosis Date  . Permanent atrial fibrillation     A.  Chronic Coumadin  . MVP (mitral valve prolapse)   . Essential hypertension, benign   . Nonischemic cardiomyopathy 2001    A.  07/23/11 - Echo: EF 35%;  B. 07/24/2011 - Cath: nonobs dzs ef 35%, 2+MR C. Echo 01/06/13: EF 40-45%, mild LVH, mlid MR, mod biatrial enlargment, mild RV dilatation, mod TR, PASP 54 mmHg  . Systolic CHF, chronic     A. Diag 07/2011, EF 35%  . Lumbar disc disease     Laminectomy 2001  . History of endometrial cancer   . Hyperlipidemia   . Rheumatoid arthritis     Treated in past with Remicade and developed vasculitis  . Gout   . PONV (postoperative nausea and vomiting)   . NSTEMI (non-ST elevated myocardial infarction) 12/2012  . GERD (gastroesophageal reflux disease)   . Coronary artery dissection 12/2012    95% OM3 hazy lesion on cath, medically managed  . Lumbar compression fracture 2014    L3, L4    Scheduled Meds:  Scheduled Meds: . atorvastatin  40 mg Oral q1800  . cephALEXin  500 mg Oral Q12H  . digoxin  0.125 mg Oral Daily  . febuxostat  40 mg Oral Daily  . gabapentin  300 mg Oral QHS  . hydroxychloroquine  200 mg Oral BID  . metoprolol succinate  25 mg Oral Q12H  . pantoprazole  40 mg Oral Daily  . potassium chloride  10 mEq Oral BID  . predniSONE  2 mg Oral Q breakfast  . sodium chloride  3 mL Intravenous Q12H  . sodium chloride  3 mL Intravenous Q12H  . Warfarin - Pharmacist Dosing Inpatient   Does not apply q1800   Continuous Infusions:   sodium chloride, acetaminophen, metoprolol, nitroGLYCERIN, ondansetron (ZOFRAN) IV, ondansetron, polyethylene glycol, sodium chloride, traMADol    PHYSICAL EXAM Filed Vitals:   01/27/14 1544 01/27/14 1643 01/27/14 2009 01/28/14 0444  BP: 106/69  142/66 102/66  Pulse: 127 108 91 71  Temp:   97.8 F (36.6 C) 98.3 F (36.8 C)    TempSrc:   Oral Oral  Resp:   20 18  Height:      Weight:    168 lb 3.2 oz (76.295 kg)  SpO2:   97% 97%    Well developed and nourished in mild resp distress HENT normal Neck suppleClear Irregularly irregular rate and rhythm with rapid ventricular response, no murmurs or gallops Abd-soft with active BS without hepatomegaly No Clubbing cyanosis  tr+ edema Skin-warm and dry A & Oriented  Grossly normal sensory and motor function   TELEMETRY: Reviewed telemetry pt in *AFib w improved HR     Intake/Output Summary (Last 24 hours) at 01/28/14 0831 Last data filed at 01/28/14 0756  Gross per 24 hour  Intake    720 ml  Output    875 ml  Net   -155 ml    LABS: Basic Metabolic Panel:  Recent Labs Lab 01/23/14 1144 01/24/14 0405 01/25/14 0323 01/26/14 0355 01/27/14 0602 01/28/14 0415  NA 146 148* 147 144 142 142  K 3.8 3.5* 3.7 3.8 4.2 3.9  CL 103 104 101 100 100 101  CO2 28 31 32 31 30 30   GLUCOSE 92 96 89 92 90 83  BUN 34* 29*  26* 26* 29* 30*  CREATININE 1.74* 1.57* 1.58* 1.74* 1.66* 1.90*  CALCIUM 9.3 8.9 9.3 9.2 9.4 9.4   Cardiac Enzymes: No results found for this basename: CKTOTAL, CKMB, CKMBINDEX, TROPONINI,  in the last 72 hours CBC:  Recent Labs Lab 01/26/14 0355  WBC 8.6  HGB 12.0  HCT 38.9  MCV 91.1  PLT 210   PROTIME:  Recent Labs  01/26/14 0355 01/27/14 0602 01/28/14 0415  LABPROT 21.9* 26.1* 25.6*  INR 1.91* 2.39* 2.33*   Liver Function Tests: No results found for this basename: AST, ALT, ALKPHOS, BILITOT, PROT, ALBUMIN,  in the last 72 hours No results found for this basename: LIPASE, AMYLASE,  in the last 72 hours BNP: BNP (last 3 results)  Recent Labs  08/21/13 1220 01/20/14 1615 01/21/14 0640  PROBNP 8457.0* 24961.0* 17548.0*     Device Interrogation:*normal device function  CXR normal lead placement   ASSESSMENT AND PLAN:  Active Problems:   Atrial fibrillation-persistent with some RVR   CHF (congestive heart  failure)   Sinus bradycardia d/c IV fluids; ?  Agree with dig  Will give extra dose today BB increase some more Some pacign will reprogram   Signed, Sherryl Manges MD  01/28/2014

## 2014-01-29 ENCOUNTER — Other Ambulatory Visit (HOSPITAL_COMMUNITY): Payer: Self-pay | Admitting: Family Medicine

## 2014-01-29 ENCOUNTER — Inpatient Hospital Stay
Admission: RE | Admit: 2014-01-29 | Discharge: 2014-02-15 | Disposition: A | Discharge status: Other Acute Inpatient Hospital | Payer: Medicare Other | Source: Ambulatory Visit | Attending: Internal Medicine | Admitting: Internal Medicine

## 2014-01-29 DIAGNOSIS — M79662 Pain in left lower leg: Secondary | ICD-10-CM

## 2014-01-29 DIAGNOSIS — R112 Nausea with vomiting, unspecified: Principal | ICD-10-CM

## 2014-01-29 DIAGNOSIS — R52 Pain, unspecified: Secondary | ICD-10-CM

## 2014-01-29 LAB — PROTIME-INR
INR: 2.24 — ABNORMAL HIGH (ref 0.00–1.49)
PROTHROMBIN TIME: 24.8 s — AB (ref 11.6–15.2)

## 2014-01-29 LAB — BASIC METABOLIC PANEL
ANION GAP: 13 (ref 5–15)
BUN: 30 mg/dL — ABNORMAL HIGH (ref 6–23)
CO2: 25 mEq/L (ref 19–32)
Calcium: 9.5 mg/dL (ref 8.4–10.5)
Chloride: 102 mEq/L (ref 96–112)
Creatinine, Ser: 1.68 mg/dL — ABNORMAL HIGH (ref 0.50–1.10)
GFR calc Af Amer: 33 mL/min — ABNORMAL LOW (ref >90)
GFR calc non Af Amer: 28 mL/min — ABNORMAL LOW (ref >90)
Glucose, Bld: 76 mg/dL (ref 70–99)
POTASSIUM: 4.7 meq/L (ref 3.7–5.3)
Sodium: 140 mEq/L (ref 137–147)

## 2014-01-29 MED ORDER — WARFARIN SODIUM 2.5 MG PO TABS
2.5000 mg | ORAL_TABLET | Freq: Once | ORAL | Status: DC
  Filled 2014-01-29: qty 1

## 2014-01-29 MED ORDER — TRAMADOL HCL 50 MG PO TABS: 50.0000 mg | ORAL_TABLET | Freq: Four times a day (QID) | ORAL | Status: AC | PRN

## 2014-01-29 MED ORDER — METOPROLOL SUCCINATE 12.5 MG HALF TABLET: 37.5000 mg | ORAL_TABLET | Freq: Two times a day (BID) | ORAL | Status: DC

## 2014-01-29 MED ORDER — CEPHALEXIN 500 MG PO CAPS: 500.0000 mg | ORAL_CAPSULE | Freq: Two times a day (BID) | ORAL | Status: AC

## 2014-01-29 MED ORDER — METOPROLOL SUCCINATE 12.5 MG HALF TABLET: 37.5000 mg | ORAL_TABLET | Freq: Two times a day (BID) | ORAL | Status: AC

## 2014-01-29 MED ORDER — FUROSEMIDE 20 MG PO TABS: 60.0000 mg | ORAL_TABLET | Freq: Every day | ORAL | Status: AC

## 2014-01-29 NOTE — Discharge Summary (Signed)
Physician Discharge Summary     Cardiologist: Purvis Sheffield  Patient ID: Marissa [name] MRN: [id number] DOB/AGE: 78-01-1933 78 y.o.  Admit date: 01/20/2014 Discharge date: 01/29/2014  Admission Diagnoses: Acute on chronic combined systolic and diastolic CHF (congestive heart failure)  Discharge Diagnoses:  Active Problems:   Atrial fibrillation-persistent with some RVR   Acute on chronic combined systolic and diastolic CHF (congestive heart failure)   Cardiomyopathy, tachycardia mediated   Sinus bradycardia   Chest pain   Abdominal pain   Tachybradycardia syndrome    Chronic kidney disease stage III   Rheumatoid arthritis   Osteoporosis   UTI   Moderate MR   Moderate to severe TR  Discharged Condition: stable  Hospital Course:   Marissa [name] is a 80 y.o.female history of CAD, chronic systolic heart failure LVEF 20-25% by echo 08/2013, restrictive diastolic dysfunction, permanent afib, rheumatoid arthritis with associated vasculitis admitted with several days of worsening SOB. She reports symptoms progressing over the last week and a half, with significant worsening over the last day or so. Increased LE edema. There had been some thought that her SOB was related to side effects to a newly started medication forteo, and it was stopped. She reports compliant with diuretic and low sodium diet at home. She describes occasional epigastric/mid chest pain 6/10 with some SOB. Can occur at rest or with exertion. Worst with deep breaths, can sometimes be associated with eating. Did get somewhat better with NG at home.   History of NSTEMI 01/2013, cath showed isolated 95% disease in small tortous OM3 thought to be the culprit, too small for intervention, managed medically. She had a VT arrest shortly after cath thought to be induced by ischemia, succesful CPR and defibrillation.  Medical therapy has been limited by bradycardia and pauses up to 3 seconds, Toprol and digoxin have been stopped previously.    The patient was admitted and started on IV lasix metoprolol was stopped.  She remained on anticoagulation with coumadin.  2-D echocardiogram is completed an ejection fraction was estimated at 15-20% (see full report below).  She had significant diuresis and 60 mg of IV Lasix and that was increased to 80 mg twice daily. She ultimately diuresis 6.6 L.   Consults completed by electrophysiology.  They thought she was poor candidate for Tikosyn with renal failure and not a candidate for ablation. He recommended rate control long-term.  A permanent pacemaker was ultimately implanted on July 20.  It was recommended that ICD not be implanted given overall poor prognosis.  Her ARB was stopped due to elevated creatinine.  Lexiscan myovue was completed and revealed Inferior wall and lateral wall infarcts with minimal peri-infarct ischemia involving the lateral wall anteriorly.  Severe global left ventricular hypokinesis.  Estimated QGS ejection fraction 14%, consistent with the history of cardiomyopathy.  IV Cardizem was added on July 19 due to an elevated ventricular rates. Lasix was held at that time and then resumed at 40 mg twice daily.  Toprol 25 mg twice daily was restarted and digoxin was started as well after pacemaker implant.  She is mild improvement in serum creatinine.  A 6 was then decreased to 60 mg daily.  She ultimately had good rate control.  The patient was seen by Marissa [name] who agrees the patient is stable for discharge to skilled nursing facility.   Followup appointments have been arranged.    Consults: EP, advanced heart failure team, physical therapy  Significant Diagnostic Studies:  Study Conclusions  - Left  ventricle: The cavity size was mildly dilated. Wall thickness was normal. Systolic function was severely reduced. LVEF 15-20%. Systolic function appears fairly similar to prior study 08/22/13. Diffuse hypokinesis. Doppler parameters are consistent with restrictive physiology,  indicative of decreased left ventricular diastolic compliance and/or increased left atrial pressure. Internal dimension, ED (PLAX chordal): 62 mm. - Aortic valve: Moderately calcified annulus. Trileaflet; moderately thickened leaflets. Probable mild aortic stenosis, lower gradient due to significantly decreased LVEF. Mean velocity (S): 99.07 cm/s. Valve area (VTI): 1.6 cm^2. - Mitral valve: Mildly calcified annulus. Mildly thickened leaflets . There was moderate regurgitation. The MR is ventrally directed, and is likely functional MR related to LV dysfunction and hypokinesis. The MR vena contracta is 0.5 cm. - Left atrium: The atrium was massively dilated. - Right ventricle: The cavity size was mildly to moderately dilated. Systolic function was mildly reduced. RV TAPSE is 1.5 cm. - Right atrium: The atrium was massively dilated. - Tricuspid valve: There was moderate-severe regurgitation. The TR jet is eccentric and posterior directed, this may lead to underestimation of severity. Limited hepatic vein pulse wave Doppler, suggestion of possible systolic flow reversal suggestive of sever TR. - Pulmonary arteries: Systolic pressure was moderately increased. Severity may be underestimated in the setting of significant TR and RV dysfunction. PA peak pressure: 55 mm Hg (S). - Inferior vena cava: The vessel was dilated. The respirophasic diameter changes were blunted (< 50%), consistent with elevated central venous pressure. - Pericardium, extracardiac: There is a small circumferential pericardial effusion. - Technically adequate study.    Treatments: See above  Discharge Exam: Blood pressure 102/64, pulse 76, temperature 97.7 F (36.5 C), temperature source Oral, resp. rate 18, height 5\' 5"  (1.651 m), weight 167 lb 3.2 oz (75.841 kg), SpO2 95.00%.  Disposition: 01-Home or Self Care      Discharge Instructions   Diet - low sodium heart healthy    Complete by:  As directed       Discharge instructions    Complete by:  As directed   Monitor weight daily. If you gain 3 pounds in 24 hours, or 5 pounds in a week, please call the office for instructions.     Increase activity slowly    Complete by:  As directed             Medication List    STOP taking these medications       isosorbide dinitrate 5 MG tablet  Commonly known as:  ISORDIL     losartan 50 MG tablet  Commonly known as:  COZAAR      TAKE these medications       atorvastatin 40 MG tablet  Commonly known as:  LIPITOR  Take 40 mg by mouth daily.     cephALEXin 500 MG capsule  Commonly known as:  KEFLEX  Take 1 capsule (500 mg total) by mouth every 12 (twelve) hours.     digoxin 0.125 MG tablet  Commonly known as:  LANOXIN  Take 1 tablet (0.125 mg total) by mouth daily.     febuxostat 40 MG tablet  Commonly known as:  ULORIC  Take 40 mg by mouth daily.     furosemide 20 MG tablet  Commonly known as:  LASIX  Take 3 tablets (60 mg total) by mouth daily.     gabapentin 300 MG capsule  Commonly known as:  NEURONTIN  Take 300 mg by mouth at bedtime.     GENTEAL OP  Place 1 drop into both  eyes daily as needed. Tired Eyes     hydroxychloroquine 200 MG tablet  Commonly known as:  PLAQUENIL  Take 1 tablet by mouth Twice daily.     metoprolol succinate 12.5 mg Tb24 24 hr tablet  Commonly known as:  TOPROL-XL  Take 1.5 tablets (37.5 mg total) by mouth every 12 (twelve) hours.     nitroGLYCERIN 0.4 MG SL tablet  Commonly known as:  NITROSTAT  Place 1 tablet (0.4 mg total) under the tongue every 5 (five) minutes as needed for chest pain.     omeprazole 20 MG capsule  Commonly known as:  PRILOSEC  Take 20 mg by mouth daily.     potassium chloride 10 MEQ tablet  Commonly known as:  K-DUR,KLOR-CON  Take 10 mEq by mouth 2 (two) times daily.     predniSONE 1 MG tablet  Commonly known as:  DELTASONE  Take 2 mg by mouth daily with breakfast.     traMADol 50 MG tablet  Commonly known  as:  ULTRAM  Take 1 tablet (50 mg total) by mouth every 6 (six) hours as needed for moderate pain.     Vitamin D 2000 UNITS Caps  Take 2,000 Units by mouth daily.     warfarin 5 MG tablet  Commonly known as:  COUMADIN  Take 2.5-5 mg by mouth daily. Take 1 tablet daily except 1/2 tablet on Mondays and Thursdays       Follow-up Information   Follow up with [facility]. [facility] Registered Nurse and Physical Therapy Services within 24-48 hours of discharge)    Contact information:   [address] [place] [address] [phone]       Follow up with  [facility] On 02/05/2014. (@10 :00 am  per [facility] )    Specialty:  Cardiology   Contact information:   [address] [id number] [place] [phone]      Follow up with [facility] clinic On 02/04/2014. (11:00 AM)    Contact information:   [address] [place] [phone]     Greater than 30 minutes was spent completing the patient's discharge.    [phone], PAC  01/29/2014, 1:45 PM

## 2014-01-29 NOTE — Progress Notes (Signed)
Patient ID: Marissa [name], female   DOB: [birth year], 80 y.o.   MRN: [id number] Advanced Heart Failure Rounding Note   Subjective:   PCP: Dr Roger Shelter Primary Cardiologist: Dr Theodoro Clock Marissa [name] is a 80 y.o. female with a past medical history significant for CAD (NSTEMI 01-2013, medical therapy recommended), hypertension, a fib in the past on chronic coumadin, rhuematoid arthritis on chronic prednisone, chronic back pain,  longstanding mixed cardiomyopathy, renal insufficiency, atrial fibrillation, and hyperlipidemia. Cardiac catheterization 01-26-13 demonstrated significant single vessel CAD with diffuse 95% stenosis in proximal OM3. She had VT arrest after cath thought to be from ischemia. It was felt that risks of PCI outweighed benefits and medical therapy was recommended. Had event montior 08/2013 with episodes of A fib RVR. Concern for tachy-mediated cardiomyopathy.  2 weeks ago started forteo which was later discontinued d/t possible dyspnea.   HF medications limited due pauses and bradycardia. She was on toprol and digoxin in the past. Prior to admit she was on lasix 40 mg bid. Admitted to [facility] with increased dyspnea and lower extremity edema. On admit  HR in 50s with intermittent tachycardia 130s. Transferred to [facility] for EP consult and consideration pacemaker versus defibrillator.   ECHO 01/21/14 EF 15-20% LA massively dilated, diffusely  Hypokinetic and RV mild-mod dilated. Mod MR, severe TR  Underwent dual chamber pacemaker (7/20) which she tolerated well. SOB with minimal exertion and needs 2x assist. Restated on PO diuretics yesterday. Denies CP or orthopnea.    Creatinine 1.8 >2.0 >1.74>1.66>1.9 INR 2.39>2.33   Objective:   Weight Range:  Vital Signs:   Temp:  [97.5 F (36.4 C)-97.8 F (36.6 C)] 97.7 F (36.5 C) (07/23 1021) Pulse Rate:  [76-105] 76 (07/23 1021) Resp:  [18] 18 (07/23 1021) BP: (102-113)/(60-66) 102/64 mmHg (07/23 1021) SpO2:  [93 %-96 %] 95 % (07/23  0552) Weight:  [167 lb 3.2 oz (75.841 kg)] 167 lb 3.2 oz (75.841 kg) (07/23 0552) Last BM Date: 01/24/14  Weight change: Filed Weights   01/27/14 0846 01/28/14 0444 01/29/14 0552  Weight: 167 lb 15.9 oz (76.2 kg) 168 lb 3.2 oz (76.295 kg) 167 lb 3.2 oz (75.841 kg)    Intake/Output:   Intake/Output Summary (Last 24 hours) at 01/29/14 1031 Last data filed at 01/29/14 0409  Gross per 24 hour  Intake    615 ml  Output    550 ml  Net     65 ml     Physical Exam: General:  Elderly appearing. No resp difficulty, lying flat in bed HEENT: normal Neck: supple. JVP 7. Carotids 2+ bilat; no bruits. No lymphadenopathy or thryomegaly appreciated. Cor: PMI nondisplaced. Regular rate & rhythm. No rubs, gallops or 2/6 HSM LLSB. Lungs: clear Abdomen: soft, nontender, + distended. No hepatosplenomegaly. No bruits or masses. Good bowel sounds. Extremities: no cyanosis, clubbing, rash, no edema Neuro: alert & orientedx3, cranial nerves grossly intact. moves all 4 extremities w/o difficulty. Affect pleasant  Telemetry: Afib 80s  Labs: Basic Metabolic Panel:  Recent Labs Lab 01/25/14 0323 01/26/14 0355 01/27/14 0602 01/28/14 0415 01/29/14 0416  NA 147 144 142 142 140  K 3.7 3.8 4.2 3.9 4.7  CL 101 100 100 101 102  CO2 32 31 30 30 25   GLUCOSE 89 92 90 83 76  BUN 26* 26* 29* 30* 30*  CREATININE 1.58* 1.74* 1.66* 1.90* 1.68*  CALCIUM 9.3 9.2 9.4 9.4 9.5    Liver Function Tests: No results found for this basename: AST, ALT, ALKPHOS,  BILITOT, PROT, ALBUMIN,  in the last 168 hours No results found for this basename: LIPASE, AMYLASE,  in the last 168 hours No results found for this basename: AMMONIA,  in the last 168 hours  CBC:  Recent Labs Lab 01/26/14 0355  WBC 8.6  HGB 12.0  HCT 38.9  MCV 91.1  PLT 210    Cardiac Enzymes: No results found for this basename: CKTOTAL, CKMB, CKMBINDEX, TROPONINI,  in the last 168 hours  BNP: BNP (last 3 results)  Recent Labs   08/21/13 1220 01/20/14 1615 01/21/14 0640  PROBNP 8457.0* 24961.0* 17548.0*     Other results:  EKG:   Imaging: No results found.   Medications:     Scheduled Medications: . atorvastatin  40 mg Oral q1800  . cephALEXin  500 mg Oral Q12H  . digoxin  0.125 mg Oral Daily  . febuxostat  40 mg Oral Daily  . gabapentin  300 mg Oral QHS  . hydroxychloroquine  200 mg Oral BID  . metoprolol succinate  37.5 mg Oral Q12H  . pantoprazole  40 mg Oral Daily  . potassium chloride  10 mEq Oral BID  . predniSONE  2 mg Oral Q breakfast  . sodium chloride  3 mL Intravenous Q12H  . sodium chloride  3 mL Intravenous Q12H  . warfarin  2.5 mg Oral ONCE-1800  . Warfarin - Pharmacist Dosing Inpatient   Does not apply q1800    Infusions:    PRN Medications: sodium chloride, acetaminophen, metoprolol, nitroGLYCERIN, ondansetron (ZOFRAN) IV, ondansetron, polyethylene glycol, sodium chloride, traMADol   Assessment/Plan    1. Acute on chronic systolic CHF: EF 54-62%, down from 40-45%.  Possibly tachy-mediated cardiomyopathy, but has history of CAD and MI.  Lexiscan Cardiolite this admission showed inferior wall and lateral wall infarcts with minimal peri-infarct ischemia involving lateral wall anteriorly.  Volume status looks ok, creatinine down to 1.6.  - Continue Lasix 60 mg daily. - Would hold off on LHC with CKD and no ischemia on Cardiolite.  - SBP 100s. Will continue Toprol 37.5 mg bid and digoxin 0.125 mg daily. Check digoxin level at followup. - She is not currently on her ARB, would like to restart eventually but will hold off currently with elevated creatinine.  Will try to do this at followup.  2. Atrial fibrillation with RVR alternating with sinus brady and pauses: Tachy-brady syndrome.  She was initially taken off metoprolol and digoxin due to pauses and bradycardia. She was started on diltiazem gtt over weekend which was discontinued.  Rhythm alternates between NSR in 70s and atrial  fibrillation in 90s-100s, mainly atrial fibrillation at this point.  She has failed amiodarone and is not a Tikosyn candidate with poor renal fxn.  Agree that she would be unlikely to maintain NSR with dilated atria and MR/TR.  Dual chamber PCM placed 7/20 for backup pacing in order to rate control her to manage her RVR and ?tachy-mediated CMP.  If she starts RV pacing a lot then would need to consider CRT upgrade in the future. She does not have an ICD due to her overall poor functional status.  Rate controlled now with Toprol XL and digoxin 0.125 mg daily.  3. Valvular heart disease: Moderate MR, moderate-severe TR.  MR looks functional and may improve as cardiomyopathy is treated.  Would not surgically treat TR.  4. CKD: Creatinine improved today.  5. Disposition: Discharge to SNF/rehab when bed available.  Will need followup with Dr Graciela Husbands for pacer check and  in CHF clinic next week.  See below home cardiac meds:   Meds: Toprol XL 37.5 mg BID Digoxin 0.125 mg daily Lasix 60 mg daily  Atorvastatin 40 mg daily warfarin  Marca Ancona NP-C 01/29/2014 10:31 AM

## 2014-01-29 NOTE — Progress Notes (Signed)
ANTICOAGULATION CONSULT NOTE - Follow Up Consult  Pharmacy Consult for Coumadin Indication: atrial fibrillation  Allergies  Allergen Reactions  . Codeine Other (See Comments)    Makes patient sleepy.  Marland Kitchen Penicillins Rash    Has tolerated ceftriaxone    Patient Measurements: Height: 5\' 5"  (165.1 cm) Weight: 167 lb 3.2 oz (75.841 kg) (Scale C) IBW/kg (Calculated) : 57  Vital Signs: Temp: 97.5 F (36.4 C) (07/23 0552) Temp src: Oral (07/23 0552) BP: 104/60 mmHg (07/23 0552) Pulse Rate: 81 (07/23 0552)  Labs:  Recent Labs  01/27/14 0602 01/28/14 0415 01/29/14 0416  LABPROT 26.1* 25.6* 24.8*  INR 2.39* 2.33* 2.24*  CREATININE 1.66* 1.90* 1.68*    Estimated Creatinine Clearance: 28.1 ml/min (by C-G formula based on Cr of 1.68).  Assessment: 80yof continues on coumadin for afib. EP wanted to continue low dose coumadin and keep her INR < 2.5 for possible pacer placement - done 7/20 - now INR goal increased back to 2-3. INR therapeutic today at 2.24. No bleeding reported.  Home dose: 5mg  daily except 2.5mg  Mon/Thurs  Goal of Therapy:  INR 2-3 Monitor platelets by anticoagulation protocol: Yes   Plan:  1) Repeat coumadin 2.5mg  x 1 2) INR in AM  8/20 01/29/2014,9:07 AM

## 2014-01-29 NOTE — Progress Notes (Signed)
Clinical Social Work Department BRIEF PSYCHOSOCIAL ASSESSMENT 01/29/2014  Patient:  Marissa [name], Marissa [name]     Account Number:  [id number]     Admit date:  01/20/2014  Clinical Social Worker:  Lourdes Sledge  Date/Time:  01/29/2014 11:11 AM  Referred by:  Physician  Date Referred:  01/29/2014 Referred for  SNF Placement   Other Referral:   Interview type:  Patient Other interview type:    PSYCHOSOCIAL DATA Living Status:  ALONE Admitted from facility:   Level of care:   Primary support name:  Xin Klawitter Primary support relationship to patient:  CHILD, ADULT Degree of support available:   Pt did not report how much support is available to her however pt lives alone and states she needs to go to a facility.    CURRENT CONCERNS Current Concerns  Post-Acute Placement   Other Concerns:    SOCIAL WORK ASSESSMENT / PLAN Covering CSW informed that pt is ready for discharge and is needing ST SNF placement.    CSW visited pt room and informed CSW that she had been visited by CSW's colleague Lovette Cliche. Pt states she is agreeable to SNF placement as she lives home alone. Pt would like placement at [facility] or [facility].    CSW to do FL2, pasarr and fax out to facilities in [place].   Assessment/plan status:  Psychosocial Support/Ongoing Assessment of Needs Other assessment/ plan:   Information/referral to community resources:   CSW provided pt with a SNF list and CSW contact information.    PATIENT'S/FAMILY'S RESPONSE TO PLAN OF CARE: Pt laying in bed, alert and oriented and pleasant to speak to. Pt agreeable to ST SNF and appreciative of CSW assistance in facilitating placement.       Theresia Bough, MSW, LCSW [phone]

## 2014-01-29 NOTE — Progress Notes (Signed)
Clinical Social Work Department CLINICAL SOCIAL WORK PLACEMENT NOTE 01/29/2014  Patient:  HOLLIE, WOJAHN  Account Number:  [id number] Admit date:  01/20/2014  Clinical Social Worker:  Theresia Bough, Theresia Majors  Date/time:  01/29/2014 11:16 PM  Clinical Social Work is seeking post-discharge placement for this patient at the following level of care:   SKILLED NURSING   (*CSW will update this form in Epic as items are completed)   01/29/2014  Patient/family provided with [facility] Department of Clinical Social Work's list of facilities offering this level of care within the geographic area requested by the patient (or if unable, by the patient's family).  01/29/2014  Patient/family informed of their freedom to choose among providers that offer the needed level of care, that participate in Medicare, Medicaid or managed care program needed by the patient, have an available bed and are willing to accept the patient.  01/29/2014  Patient/family informed of [facility]' ownership interest in [facility], as well as of the fact that they are under no obligation to receive care at this facility.  PASARR submitted to [employer] on 01/29/2014 PASARR number received on   FL2 transmitted to all facilities in geographic area requested by pt/family on  01/29/2014 FL2 transmitted to all facilities within larger geographic area on   Patient informed that his/her managed care company has contracts with or will negotiate with  certain facilities, including the following:     Patient/family informed of bed offers received:   Patient chooses bed at  Physician recommends and patient chooses bed at    Patient to be transferred to  on   Patient to be transferred to facility by  Patient and family notified of transfer on  Name of family member notified:    The following physician request were entered in Epic:   Additional Comments:  Theresia Bough, MSW, LCSW [phone]

## 2014-01-29 NOTE — Progress Notes (Signed)
Pt is being discharged to [facility] center for rehabilitation. Caregiver will be transporting patient there. Report called to Nurse Zollie Scale at [facility]. All paperwork has been completed for discharge.

## 2014-01-30 ENCOUNTER — Ambulatory Visit (HOSPITAL_COMMUNITY)
Admission: RE | Admit: 2014-01-30 | Discharge: 2014-01-30 | Disposition: A | Discharge status: Home / Self Care | Payer: BLUE CROSS/BLUE SHIELD | Source: Ambulatory Visit | Attending: Family Medicine | Admitting: Family Medicine

## 2014-01-30 DIAGNOSIS — M7989 Other specified soft tissue disorders: Secondary | ICD-10-CM | POA: Insufficient documentation

## 2014-01-30 DIAGNOSIS — M7120 Synovial cyst of popliteal space [Baker], unspecified knee: Secondary | ICD-10-CM | POA: Insufficient documentation

## 2014-01-30 DIAGNOSIS — M79609 Pain in unspecified limb: Secondary | ICD-10-CM | POA: Insufficient documentation

## 2014-01-30 DIAGNOSIS — M79662 Pain in left lower leg: Secondary | ICD-10-CM

## 2014-01-31 ENCOUNTER — Encounter (HOSPITAL_COMMUNITY): Payer: Self-pay | Admitting: *Deleted

## 2014-02-03 ENCOUNTER — Telehealth (HOSPITAL_COMMUNITY): Payer: Self-pay | Admitting: Vascular Surgery

## 2014-02-04 ENCOUNTER — Ambulatory Visit: Payer: Medicare Other

## 2014-02-04 NOTE — H&P (Unsigned)
Marissa [name], JHAVERI                ACCOUNT NO.:  [id number]  MEDICAL RECORD NO.:  [id number]  LOCATION:  S158                          FACILITY:  [facility]  PHYSICIAN:  Kingsley Callander. [name], MD       DATE OF BIRTH:  [birth year]  DATE OF ADMISSION:  01/29/2014 DATE OF DISCHARGE:  LH                             HISTORY & PHYSICAL   HISTORY OF PRESENT ILLNESS:  This patient is a 80 year old female who was transferred from [facility] for rehabilitation.  She has had recent difficulty with recurrent congestive heart failure along with tachybrady syndrome.  She ultimately required pacemaker placement at [facility]. Medications have been modified.  She has had improvement in her shortness of breath.  She previously had an ejection fraction of 20-25% by echo earlier this year.  She has also had diastolic dysfunction and permanent atrial fibrillation.  PAST MEDICAL HISTORY: 1. Chronic systolic heart failure. 2. Coronary artery disease. 3. Atrial fibrillation. 4. Rheumatoid arthritis. 5. Osteoporosis with spine compression fractures. 6. Gout. 7. Lumbar disk disease. 8. Chronic kidney disease. 9. Endometrial carcinoma, status post TAH-BSO in 2000. 10.Appendectomy. 11.Lumbar laminectomy.  ALLERGIES:  PENICILLIN and CODEINE.  MEDICATIONS: 1. Lipitor 40 mg daily. 2. Digoxin 125 mcg daily. 3. Lasix 60 mg daily. 4. Gabapentin 300 mg at bedtime. 5. Hydroxychloroquine 400 mg daily. 6. Toprol-XL 25 mg 1-1/2 tablets twice a day. 7. Sublingual nitroglycerin p.r.n. 8. Omeprazole 20 mg daily. 9. Potassium 10 mEq twice a day. 10.Prednisone 2 mg daily. 11.Tramadol 50 mg q.6 p.r.n. 12.Uloric 40 mg daily. 13.Vitamin D 2000 units daily. 14.Coumadin 5 mg on Sunday, Tuesday, Wednesday, Friday and Saturday     and Coumadin 2.5 mg on Monday and Thursday.  SOCIAL HISTORY:  She does not smoke, drink, or use recreational drugs.  FAMILY HISTORY:  Includes stomach cancer in her father.  Mother died  at 48.  REVIEW OF SYSTEMS:  Shortness of breath has improved.  She is not having chest pain.  She is not having significant pain at the site of the pacemaker implantation.  She has had ongoing back pain after a recent L3 fracture.  No leg swelling is currently noted.  PHYSICAL EXAMINATION:  GENERAL:  Alert, no distress. HEENT:  Eyes, nose, and oropharynx are unremarkable. NECK:  No JVD or thyromegaly. LUNGS:  Clear. HEART:  Regular. ABDOMEN:  Soft and nontender with no organomegaly. EXTREMITIES:  No clubbing or edema. NEURO:  No focal weakness. LYMPH NODES:  No cervical or supraclavicular enlargement. SKIN:  Pacemaker site is healing well on the left upper chest.  IMPRESSION/PLAN: 1. Chronic systolic heart failure.  Continue current Lasix dose. 2. Tachybrady syndrome, status post pacemaker placement.  She will     continue metoprolol and digoxin. 3. Coronary artery disease. 4. Atrial fibrillation. 5. Chronic kidney disease. 6. Rheumatoid arthritis. 7. Osteoporosis and spine fractures.  She will need additional     physical therapy. 8. Rheumatoid arthritis.  She will have ongoing Rheumatology followup. 9. Code status.  She has desired no CPR, intubation or mechanical     ventilation.     Kingsley Callander. [name], MD     ROF/MEDQ  D:  02/04/2014  T:  02/04/2014  Job:  [id number]

## 2014-02-05 ENCOUNTER — Ambulatory Visit (INDEPENDENT_AMBULATORY_CARE_PROVIDER_SITE_OTHER): Payer: Medicare Other | Admitting: *Deleted

## 2014-02-05 ENCOUNTER — Encounter (HOSPITAL_COMMUNITY): Payer: Self-pay

## 2014-02-05 ENCOUNTER — Ambulatory Visit (HOSPITAL_COMMUNITY)
Admit: 2014-02-05 | Discharge: 2014-02-05 | Disposition: A | Discharge status: Home / Self Care | Payer: No Typology Code available for payment source | Source: Ambulatory Visit | Attending: Internal Medicine | Admitting: Internal Medicine

## 2014-02-05 VITALS — BP 100/48 | HR 69 | Wt 167.0 lb

## 2014-02-05 DIAGNOSIS — I495 Sick sinus syndrome: Secondary | ICD-10-CM | POA: Diagnosis not present

## 2014-02-05 DIAGNOSIS — I498 Other specified cardiac arrhythmias: Secondary | ICD-10-CM

## 2014-02-05 DIAGNOSIS — Z7901 Long term (current) use of anticoagulants: Secondary | ICD-10-CM | POA: Insufficient documentation

## 2014-02-05 DIAGNOSIS — I509 Heart failure, unspecified: Secondary | ICD-10-CM | POA: Diagnosis not present

## 2014-02-05 DIAGNOSIS — I2510 Atherosclerotic heart disease of native coronary artery without angina pectoris: Secondary | ICD-10-CM | POA: Diagnosis not present

## 2014-02-05 DIAGNOSIS — E785 Hyperlipidemia, unspecified: Secondary | ICD-10-CM | POA: Diagnosis not present

## 2014-02-05 DIAGNOSIS — I5022 Chronic systolic (congestive) heart failure: Secondary | ICD-10-CM

## 2014-02-05 DIAGNOSIS — I4891 Unspecified atrial fibrillation: Secondary | ICD-10-CM

## 2014-02-05 DIAGNOSIS — I10 Essential (primary) hypertension: Secondary | ICD-10-CM | POA: Diagnosis not present

## 2014-02-05 DIAGNOSIS — R001 Bradycardia, unspecified: Secondary | ICD-10-CM

## 2014-02-05 DIAGNOSIS — I480 Paroxysmal atrial fibrillation: Secondary | ICD-10-CM | POA: Insufficient documentation

## 2014-02-05 LAB — BASIC METABOLIC PANEL
Anion gap: 12 (ref 5–15)
BUN: 27 mg/dL — ABNORMAL HIGH (ref 6–23)
CO2: 26 mEq/L (ref 19–32)
Calcium: 9.7 mg/dL (ref 8.4–10.5)
Chloride: 107 mEq/L (ref 96–112)
Creatinine, Ser: 1.65 mg/dL — ABNORMAL HIGH (ref 0.50–1.10)
GFR calc non Af Amer: 29 mL/min — ABNORMAL LOW (ref >90)
GFR, EST AFRICAN AMERICAN: 33 mL/min — AB (ref >90)
Glucose, Bld: 101 mg/dL — ABNORMAL HIGH (ref 70–99)
POTASSIUM: 4.7 meq/L (ref 3.7–5.3)
Sodium: 145 mEq/L (ref 137–147)

## 2014-02-05 LAB — PRO B NATRIURETIC PEPTIDE: Pro B Natriuretic peptide (BNP): 19144 pg/mL — ABNORMAL HIGH (ref 0–450)

## 2014-02-05 NOTE — Progress Notes (Signed)
PCP: Dr Roger Shelter  Primary Cardiologist: Dr Purvis Sheffield   HPI: Marissa [name] is a 80 y.o. female with a past medical history significant for CAD (NSTEMI 01-2013, medical therapy recommended), hypertension, PAF on chronic coumadin, rhuematoid arthritis on chronic prednisone, chronic back pain, longstanding mixed cardiomyopathy, renal insufficiency, and hyperlipidemia. Cardiac catheterization 01-26-13 demonstrated significant single vessel CAD with diffuse 95% stenosis in proximal OM3. She had VT arrest after cath thought to be from ischemia. It was felt that risks of PCI outweighed benefits and medical therapy was recommended.  Admitted to [facility] 01/20/14 with increased dyspnea and lower extremity edema and PAF with tachy-brady syndrome. Echo showed EF 15-20% (down from 40-45%) - ? Tachy-mediated. Underwent placement of St. Jude dual chamber permanent pacemaker. She was diuresed. She was later discharged to [facility] on July 23.  Discharge weight 167 pounds.   She returns for post hospital follow up.  Complaining of nausea. Complains of R flank pain --had R kidney stone noted CT July. Remains SOB with exertion. Having difficulty participating in PT due to fatigue. Weight at SNF 167-170 pounds.   ECHO 01/21/14 EF 15-20% LA massively dilated, diffusely Hypokinetic and RV mild-mod dilated. Mod MR, severe TR   ROS: All systems negative except as listed in HPI, PMH and Problem List.   Past Medical History  Diagnosis Date  . Permanent atrial fibrillation     A.  Chronic Coumadin  . MVP (mitral valve prolapse)   . Essential hypertension, benign   . Nonischemic cardiomyopathy 2001    A.  07/23/11 - Echo: EF 35%;  B. 07/24/2011 - Cath: nonobs dzs ef 35%, 2+MR C. Echo 01/06/13: EF 40-45%, mild LVH, mlid MR, mod biatrial enlargment, mild RV dilatation, mod TR, PASP 54 mmHg  . Systolic CHF, chronic     A. Diag 07/2011, EF 35%  . Lumbar disc disease     Laminectomy 2001  . History of endometrial cancer   .  Hyperlipidemia   . Rheumatoid arthritis     Treated in past with Remicade and developed vasculitis  . Gout   . PONV (postoperative nausea and vomiting)   . NSTEMI (non-ST elevated myocardial infarction) 12/2012  . GERD (gastroesophageal reflux disease)   . Coronary artery dissection 12/2012    95% OM3 hazy lesion on cath, medically managed  . Lumbar compression fracture 2014    L3, L4  . Symptomatic bradycardia    Current Outpatient Prescriptions on File Prior to Encounter  Medication Sig Dispense Refill  . atorvastatin (LIPITOR) 40 MG tablet Take 40 mg by mouth daily.      . cephALEXin (KEFLEX) 500 MG capsule Take 1 capsule (500 mg total) by mouth every 12 (twelve) hours.  1 capsule  0  . Cholecalciferol (VITAMIN D) 2000 UNITS CAPS Take 2,000 Units by mouth daily.      . digoxin (LANOXIN) 0.125 MG tablet Take 1 tablet (0.125 mg total) by mouth daily.  30 tablet  12  . febuxostat (ULORIC) 40 MG tablet Take 40 mg by mouth daily.      . furosemide (LASIX) 20 MG tablet Take 3 tablets (60 mg total) by mouth daily.  90 tablet  11  . gabapentin (NEURONTIN) 300 MG capsule Take 300 mg by mouth at bedtime.      . hydroxychloroquine (PLAQUENIL) 200 MG tablet Take 1 tablet by mouth Twice daily.      . Hypromellose (GENTEAL OP) Place 1 drop into both eyes daily as needed. Tired Eyes      .  metoprolol succinate (TOPROL-XL) 12.5 mg TB24 24 hr tablet Take 1.5 tablets (37.5 mg total) by mouth every 12 (twelve) hours.  90 tablet  5  . nitroGLYCERIN (NITROSTAT) 0.4 MG SL tablet Place 1 tablet (0.4 mg total) under the tongue every 5 (five) minutes as needed for chest pain.  25 tablet  12  . omeprazole (PRILOSEC) 20 MG capsule Take 20 mg by mouth daily.      . potassium chloride (K-DUR,KLOR-CON) 10 MEQ tablet Take 10 mEq by mouth 2 (two) times daily.       . predniSONE (DELTASONE) 1 MG tablet Take 2 mg by mouth daily with breakfast.       . traMADol (ULTRAM) 50 MG tablet Take 1 tablet (50 mg total) by mouth  every 6 (six) hours as needed for moderate pain.  30 tablet  5  . warfarin (COUMADIN) 5 MG tablet Take 2.5-5 mg by mouth daily. Take 1 tablet daily except 1/2 tablet on Mondays and Thursdays       No current facility-administered medications on file prior to encounter.     PHYSICAL EXAM: Filed Vitals:   02/05/14 1004  BP: 100/48  Pulse: 69  Weight: 167 lb (75.751 kg)  SpO2: 96%    General:  Elderly. No resp difficulty. Sitting in wheelchair. Daughter present HEENT: normal Neck: supple. JVP flat. Carotids 2+ bilaterally; no bruits. No lymphadenopathy or thryomegaly appreciated. Cor: PMI normal. Regular rate & rhythm. No rubs, gallops, 2/6 HSM LLSB.  L upper chest dressing intact.  Lungs: clear Abdomen: soft, nontender, nondistended. No hepatosplenomegaly. No bruits or masses. Good bowel sounds. Extremities: no cyanosis, clubbing, rash, edema Neuro: alert & orientedx3, cranial nerves grossly intact. Moves all 4 extremities w/o difficulty. Affect pleasant.  EKG  A paced 60 BPM    ASSESSMENT & PLAN: 1. Chronic systolic CHF: EF 33-54%, down from 40-45%. Possibly tachy-mediated cardiomyopathy, but has history of CAD and MI. Lexiscan Cardiolite in July showed inferior wall and lateral wall infarcts with minimal peri-infarct ischemia involving lateral wall anteriorly.  -Volume status stable. Continue lasix 60 mg daily .  - Toprol XL 37.5 mg  bid and digoxin 0.125 mg daily.  -Consider adding ARB soon but CrCL ~30  -Will need repeat  ECHO in 3 months  Check BMET and Pro BNP now. I have provided SNF with HF orders to provide low diet with fluid restrictions as well as daily weights. Will need dig level followed.  Refer back to Dr Purvis Sheffield per patients request.  2. S/P St Jude dual chamber pace maker for symptomatic bradycardia. Today she is A paced 60 bpm. Given LV dysfunction will want to avoid RV pacing. Continue current dose of Toprol XL and digoxin 0.125 mg daily.  3. PAF - in NSR.  Continue coumadin 4. CAD - no CP.   CLEGG,AMY NP-C  2:05 PM  Patient seen and examined with Tonye Becket, NP. We discussed all aspects of the encounter. I agree with the assessment and plan as stated above. She is improved post hospitalization but remains very weak. Volume looks ok. Suspect CM may be tachy-induced. Continue current regimen, will need repeat echo in 2-3 months. Watch for RV pacing. She wants to f/u with Dr. Purvis Sheffield in [place].  Truman Hayward 9:49 PM

## 2014-02-06 LAB — MDC_IDC_ENUM_SESS_TYPE_INCLINIC
Battery Remaining Longevity: 82.8 mo
Battery Voltage: 3.02 V
Brady Statistic RA Percent Paced: 65 %
Brady Statistic RV Percent Paced: 19 %
Date Time Interrogation Session: 20150730162208
Implantable Pulse Generator Model: 2240
Implantable Pulse Generator Serial Number: 7644420
Lead Channel Impedance Value: 425 Ohm
Lead Channel Pacing Threshold Amplitude: 1 V
Lead Channel Pacing Threshold Amplitude: 1 V
Lead Channel Pacing Threshold Pulse Width: 0.4 ms
Lead Channel Pacing Threshold Pulse Width: 0.4 ms
Lead Channel Pacing Threshold Pulse Width: 0.4 ms
Lead Channel Sensing Intrinsic Amplitude: 0.7 mV
Lead Channel Sensing Intrinsic Amplitude: 7.6 mV
Lead Channel Setting Pacing Amplitude: 3.5 V
Lead Channel Setting Sensing Sensitivity: 2 mV
MDC IDC MSMT LEADCHNL RA PACING THRESHOLD AMPLITUDE: 1 V
MDC IDC MSMT LEADCHNL RV IMPEDANCE VALUE: 525 Ohm
MDC IDC MSMT LEADCHNL RV PACING THRESHOLD AMPLITUDE: 1 V
MDC IDC MSMT LEADCHNL RV PACING THRESHOLD PULSEWIDTH: 0.4 ms
MDC IDC SET LEADCHNL RV PACING AMPLITUDE: 3.5 V
MDC IDC SET LEADCHNL RV PACING PULSEWIDTH: 0.4 ms

## 2014-02-06 NOTE — Progress Notes (Signed)
Wound check appointment. Staples removed. Wound without redness or edema. Incision edges approximated, wound well healed. Normal device function. Thresholds, sensing, and impedances consistent with implant measurements. Device programmed at 3.5V for extra safety margin until 3 month visit. Histogram distribution appropriate for patient and level of activity. 1,849 mode switches(19%)---max dur. 1 hour 58 mins, Max A 439, Max V 119, Last 7-29 + Warfarin. No high ventricular rates noted. Patient educated about wound care, arm mobility, lifting restrictions. ROV in 3 months with GT.

## 2014-02-12 NOTE — Progress Notes (Unsigned)
NAMEKEMARIA, DEDIC                ACCOUNT NO.:  [id number]  MEDICAL RECORD NO.:  [id number]  LOCATION:                                 FACILITY:  PHYSICIAN:  Kingsley Callander. [name], MD       DATE OF BIRTH:  1933/02/16  DATE OF PROCEDURE:  02/11/2014 DATE OF DISCHARGE:                                PROGRESS NOTE   SUBJECTIVE:  Ms. Chiao complains of recent constipation.  She was recently prescribed a stool softener, but notes no significant improvement from it.  She has had some nausea.  She states that her appetite has been diminished.  She denies any palpitations now.  OBJECTIVE:  GENERAL:  No distress. HEENT:  Unremarkable. LUNGS:  Clear. HEART:  Regular with a grade 2 systolic murmur.  Her pacemaker site in her left upper chest is well healed. ABDOMEN:  Nontender, nondistended. EXTREMITIES:  Revealed no edema.  IMPRESSION/PLAN: 1. Constipation, continue Colace 100 mg daily.  Add MiraLAX 17 g     daily.  Encouraged her to minimize the pain medication, she is     using for her spine fracture.  She has recently been taking 1 pain     pill per day,  she states. 2. Tachy-brady syndrome, status post pacemaker placement.  She is     having no symptoms of rapid rhythms. 3. Chronic anticoagulation.  She will have continued management     through cardiology. 4. Rheumatoid arthritis.  She is evidently supposed to have further     reduction in her prednisone dose, although I am unclear on this.     We will need to have further recommendations from her     rheumatologist in this regard. 5. Congestive heart failure, stable on current diuretic dose.     Kingsley Callander. [name], MD     ROF/MEDQ  D:  02/11/2014  T:  02/11/2014  Job:  [id number]

## 2014-02-13 ENCOUNTER — Ambulatory Visit (HOSPITAL_COMMUNITY): Payer: Medicare Other | Attending: Internal Medicine

## 2014-02-13 DIAGNOSIS — R1110 Vomiting, unspecified: Secondary | ICD-10-CM | POA: Diagnosis present

## 2014-02-13 DIAGNOSIS — R197 Diarrhea, unspecified: Secondary | ICD-10-CM | POA: Diagnosis not present

## 2014-02-15 ENCOUNTER — Emergency Department (HOSPITAL_COMMUNITY): Payer: Medicare Other

## 2014-02-15 ENCOUNTER — Emergency Department (HOSPITAL_COMMUNITY)
Admission: EM | Admit: 2014-02-15 | Discharge: 2014-03-10 | Disposition: E | Payer: Medicare Other | Attending: Emergency Medicine | Admitting: Emergency Medicine

## 2014-02-15 ENCOUNTER — Encounter (HOSPITAL_COMMUNITY): Payer: Self-pay | Admitting: Emergency Medicine

## 2014-02-15 DIAGNOSIS — I10 Essential (primary) hypertension: Secondary | ICD-10-CM | POA: Diagnosis not present

## 2014-02-15 DIAGNOSIS — I4891 Unspecified atrial fibrillation: Secondary | ICD-10-CM | POA: Diagnosis not present

## 2014-02-15 DIAGNOSIS — I5022 Chronic systolic (congestive) heart failure: Secondary | ICD-10-CM | POA: Diagnosis not present

## 2014-02-15 DIAGNOSIS — Z880 Allergy status to penicillin: Secondary | ICD-10-CM | POA: Diagnosis not present

## 2014-02-15 DIAGNOSIS — Z8542 Personal history of malignant neoplasm of other parts of uterus: Secondary | ICD-10-CM | POA: Diagnosis not present

## 2014-02-15 DIAGNOSIS — Z7901 Long term (current) use of anticoagulants: Secondary | ICD-10-CM | POA: Insufficient documentation

## 2014-02-15 DIAGNOSIS — IMO0002 Reserved for concepts with insufficient information to code with codable children: Secondary | ICD-10-CM | POA: Insufficient documentation

## 2014-02-15 DIAGNOSIS — I498 Other specified cardiac arrhythmias: Secondary | ICD-10-CM | POA: Insufficient documentation

## 2014-02-15 DIAGNOSIS — E785 Hyperlipidemia, unspecified: Secondary | ICD-10-CM | POA: Insufficient documentation

## 2014-02-15 DIAGNOSIS — I428 Other cardiomyopathies: Secondary | ICD-10-CM | POA: Insufficient documentation

## 2014-02-15 DIAGNOSIS — I059 Rheumatic mitral valve disease, unspecified: Secondary | ICD-10-CM | POA: Diagnosis not present

## 2014-02-15 DIAGNOSIS — Z79899 Other long term (current) drug therapy: Secondary | ICD-10-CM | POA: Insufficient documentation

## 2014-02-15 DIAGNOSIS — Z8739 Personal history of other diseases of the musculoskeletal system and connective tissue: Secondary | ICD-10-CM | POA: Diagnosis not present

## 2014-02-15 DIAGNOSIS — Z8781 Personal history of (healed) traumatic fracture: Secondary | ICD-10-CM | POA: Diagnosis not present

## 2014-02-15 DIAGNOSIS — I252 Old myocardial infarction: Secondary | ICD-10-CM | POA: Diagnosis not present

## 2014-02-15 DIAGNOSIS — I469 Cardiac arrest, cause unspecified: Secondary | ICD-10-CM | POA: Insufficient documentation

## 2014-02-15 LAB — CBC WITH DIFFERENTIAL/PLATELET
Basophils Absolute: 0 10*3/uL (ref 0.0–0.1)
Basophils Relative: 0 % (ref 0–1)
EOS ABS: 0 10*3/uL (ref 0.0–0.7)
Eosinophils Relative: 0 % (ref 0–5)
HCT: 32.2 % — ABNORMAL LOW (ref 36.0–46.0)
Hemoglobin: 9.7 g/dL — ABNORMAL LOW (ref 12.0–15.0)
LYMPHS ABS: 3 10*3/uL (ref 0.7–4.0)
LYMPHS PCT: 21 % (ref 12–46)
MCH: 28 pg (ref 26.0–34.0)
MCHC: 30.1 g/dL (ref 30.0–36.0)
MCV: 92.8 fL (ref 78.0–100.0)
Monocytes Absolute: 0.9 10*3/uL (ref 0.1–1.0)
Monocytes Relative: 6 % (ref 3–12)
NEUTROS PCT: 73 % (ref 43–77)
Neutro Abs: 10.5 10*3/uL — ABNORMAL HIGH (ref 1.7–7.7)
Platelets: 232 10*3/uL (ref 150–400)
RBC: 3.47 MIL/uL — AB (ref 3.87–5.11)
RDW: 16.1 % — ABNORMAL HIGH (ref 11.5–15.5)
WBC: 14.3 10*3/uL — ABNORMAL HIGH (ref 4.0–10.5)

## 2014-02-15 LAB — I-STAT TROPONIN, ED: Troponin i, poc: 0.39 ng/mL (ref 0.00–0.08)

## 2014-02-15 LAB — TROPONIN I: Troponin I: 0.52 ng/mL (ref <0.30)

## 2014-02-15 LAB — CBG MONITORING, ED: GLUCOSE-CAPILLARY: 120 mg/dL — AB (ref 70–99)

## 2014-02-15 LAB — PROTIME-INR
INR: 7.51 — AB (ref 0.00–1.49)
Prothrombin Time: 63.8 seconds — ABNORMAL HIGH (ref 11.6–15.2)

## 2014-02-15 LAB — COMPREHENSIVE METABOLIC PANEL
ALBUMIN: 2.3 g/dL — AB (ref 3.5–5.2)
ALT: 114 U/L — AB (ref 0–35)
AST: 260 U/L — AB (ref 0–37)
Alkaline Phosphatase: 70 U/L (ref 39–117)
Anion gap: 16 — ABNORMAL HIGH (ref 5–15)
BILIRUBIN TOTAL: 0.5 mg/dL (ref 0.3–1.2)
BUN: 28 mg/dL — ABNORMAL HIGH (ref 6–23)
CO2: 21 mEq/L (ref 19–32)
Calcium: 8.4 mg/dL (ref 8.4–10.5)
Chloride: 111 mEq/L (ref 96–112)
Creatinine, Ser: 2.11 mg/dL — ABNORMAL HIGH (ref 0.50–1.10)
GFR calc Af Amer: 25 mL/min — ABNORMAL LOW (ref >90)
GFR calc non Af Amer: 21 mL/min — ABNORMAL LOW (ref >90)
Glucose, Bld: 190 mg/dL — ABNORMAL HIGH (ref 70–99)
Potassium: 3.7 mEq/L (ref 3.7–5.3)
SODIUM: 148 meq/L — AB (ref 137–147)
TOTAL PROTEIN: 4.9 g/dL — AB (ref 6.0–8.3)

## 2014-02-15 LAB — PRO B NATRIURETIC PEPTIDE: Pro B Natriuretic peptide (BNP): 13806 pg/mL — ABNORMAL HIGH (ref 0–450)

## 2014-02-15 LAB — I-STAT CG4 LACTIC ACID, ED: LACTIC ACID, VENOUS: 5.55 mmol/L — AB (ref 0.5–2.2)

## 2014-02-15 MED ORDER — SODIUM CHLORIDE 0.9 % IV BOLUS (SEPSIS)
1000.0000 mL | Freq: Once | INTRAVENOUS | Status: AC
  Administered 2014-02-15: 1000 mL via INTRAVENOUS

## 2014-02-15 MED ORDER — SODIUM CHLORIDE 0.9 % IV SOLN
25.0000 ug/h | INTRAVENOUS | Status: DC
  Filled 2014-02-15: qty 50

## 2014-02-15 MED ORDER — CISATRACURIUM BOLUS VIA INFUSION
0.0500 mg/kg | INTRAVENOUS | Status: DC | PRN
  Filled 2014-02-15: qty 4

## 2014-02-15 MED ORDER — ETOMIDATE 2 MG/ML IV SOLN
20.0000 mg | Freq: Once | INTRAVENOUS | Status: AC
  Administered 2014-02-15: 20 mg via INTRAVENOUS

## 2014-02-15 MED ORDER — LIDOCAINE HCL (CARDIAC) 20 MG/ML IV SOLN
INTRAVENOUS | Status: AC
  Filled 2014-02-15: qty 5

## 2014-02-15 MED ORDER — ROCURONIUM BROMIDE 50 MG/5ML IV SOLN
INTRAVENOUS | Status: AC
  Filled 2014-02-15: qty 2

## 2014-02-15 MED ORDER — ASPIRIN 300 MG RE SUPP: 300.0000 mg | RECTAL | Status: DC

## 2014-02-15 MED ORDER — SODIUM BICARBONATE 8.4 % IV SOLN
INTRAVENOUS | Status: DC | PRN
  Administered 2014-02-15: 50 meq via INTRAVENOUS

## 2014-02-15 MED ORDER — SUCCINYLCHOLINE CHLORIDE 20 MG/ML IJ SOLN
100.0000 mg | Freq: Once | INTRAMUSCULAR | Status: AC
  Administered 2014-02-15: 100 mg via INTRAVENOUS
  Filled 2014-02-15: qty 5

## 2014-02-15 MED ORDER — SUCCINYLCHOLINE CHLORIDE 20 MG/ML IJ SOLN
INTRAMUSCULAR | Status: AC
  Administered 2014-02-15: 100 mg via INTRAVENOUS
  Filled 2014-02-15: qty 1

## 2014-02-15 MED ORDER — NOREPINEPHRINE BITARTRATE 1 MG/ML IV SOLN
0.5000 ug/min | INTRAVENOUS | Status: DC
  Administered 2014-02-15: 0.5 ug/min via INTRAVENOUS
  Filled 2014-02-15: qty 4

## 2014-02-15 MED ORDER — ETOMIDATE 2 MG/ML IV SOLN
INTRAVENOUS | Status: AC
  Administered 2014-02-15: 20 mg via INTRAVENOUS
  Filled 2014-02-15: qty 20

## 2014-02-15 MED ORDER — CISATRACURIUM BOLUS VIA INFUSION
0.1000 mg/kg | Freq: Once | INTRAVENOUS | Status: DC
  Filled 2014-02-15: qty 8

## 2014-02-15 MED ORDER — CISATRACURIUM BESYLATE 10 MG/ML IV SOLN
1.0000 ug/kg/min | INTRAVENOUS | Status: DC
  Filled 2014-02-15: qty 20

## 2014-02-15 MED ORDER — EPINEPHRINE HCL 0.1 MG/ML IJ SOSY
PREFILLED_SYRINGE | INTRAMUSCULAR | Status: DC | PRN
  Administered 2014-02-15 (×4): 1 via INTRAVENOUS

## 2014-02-15 MED ORDER — EPINEPHRINE HCL 0.1 MG/ML IJ SOSY
PREFILLED_SYRINGE | INTRAMUSCULAR | Status: AC | PRN
  Administered 2014-02-15: 1 via INTRAVENOUS

## 2014-02-15 MED ORDER — SODIUM CHLORIDE 0.9 % IV SOLN
1.0000 mg/h | INTRAVENOUS | Status: DC
  Filled 2014-02-15: qty 10

## 2014-02-15 MED ORDER — SODIUM CHLORIDE 0.9 % IV SOLN: 2000.0000 mL | Freq: Once | INTRAVENOUS | Status: DC

## 2014-02-15 MED FILL — Medication: Qty: 1 | Status: AC

## 2014-02-16 ENCOUNTER — Ambulatory Visit: Payer: Self-pay | Admitting: *Deleted

## 2014-02-16 DIAGNOSIS — I4891 Unspecified atrial fibrillation: Secondary | ICD-10-CM

## 2014-02-16 DIAGNOSIS — Z5181 Encounter for therapeutic drug level monitoring: Secondary | ICD-10-CM

## 2014-02-16 NOTE — ED Provider Notes (Signed)
CSN: [id number]     Arrival date & time 2014-03-04  1257 History   First MD Initiated Contact with Patient 2014/03/04 1303     Chief Complaint  Patient presents with  . Cardiac Arrest     (Consider location/radiation/quality/duration/timing/severity/associated sxs/prior Treatment) HPI Comments: Patient was brought to the emergency department by EMS after cardiac arrest at the nursing home. She was found unresponsive in her bed, according to EMS, although information is unclear. First responders found the classes, placed her on an AED, and shocked her. EMS report that when they arrived on the scene, she had pauses and blood pressure. Taking his airway was placed and she was bagged for transport. She has had some purposeful movement during transport. Patient reportedly has been stable throughout transport. Level V Caveat due to acuity of condition.   Past Medical History  Diagnosis Date  . Permanent atrial fibrillation     A.  Chronic Coumadin  . MVP (mitral valve prolapse)   . Essential hypertension, benign   . Nonischemic cardiomyopathy 2001    A.  07/23/11 - Echo: EF 35%;  B. 07/24/2011 - Cath: nonobs dzs ef 35%, 2+MR C. Echo 01/06/13: EF 40-45%, mild LVH, mlid MR, mod biatrial enlargment, mild RV dilatation, mod TR, PASP 54 mmHg  . Systolic CHF, chronic     A. Diag 07/2011, EF 35%  . Lumbar disc disease     Laminectomy 2001  . History of endometrial cancer   . Hyperlipidemia   . Rheumatoid arthritis     Treated in past with Remicade and developed vasculitis  . Gout   . PONV (postoperative nausea and vomiting)   . NSTEMI (non-ST elevated myocardial infarction) 12/2012  . GERD (gastroesophageal reflux disease)   . Coronary artery dissection 12/2012    95% OM3 hazy lesion on cath, medically managed  . Lumbar compression fracture 2014    L3, L4  . Symptomatic bradycardia    Past Surgical History  Procedure Laterality Date  . Appendectomy    . Abdominal hysterectomy  2000    Stage 1  endometrial cancer  . Hernia repair  09/2008  . Hernia repair  04/17/11    Lap ventral incisional hernia repair with mesh   . Gastric biopsy      Benign  . Excision of lipoma    . Lumbar laminectomy  2001  . Hammer toe surgery    . Cardiac catheterization  01/08/13    OM3 95% lesion felt to represent coronary dissection, mild luminal irregularities elsewhere; EF 40-45%; medically managed  . Pacemaker insertion  01/26/14    STJ dual chamber pacemaker implanted by Dr Rubie Maid for symptomatic bradycardia   Family History  Problem Relation Age of Onset  . Cancer Father    History  Substance Use Topics  . Smoking status: Never Smoker   . Smokeless tobacco: Never Used  . Alcohol Use: Yes     Comment: Rare   OB History   Grav Para Term Preterm Abortions TAB SAB Ect Mult Living                 Review of Systems  Unable to perform ROS: Acuity of condition      Allergies  Codeine and Penicillins  Home Medications   Prior to Admission medications   Medication Sig Start Date End Date Taking? Authorizing Provider  atorvastatin (LIPITOR) 40 MG tablet Take 40 mg by mouth daily.    Historical Provider, MD  cephALEXin (KEFLEX) 500 MG capsule  Take 1 capsule (500 mg total) by mouth every 12 (twelve) hours. 01/29/14   Wilburt Finlay, PA-C  Cholecalciferol (VITAMIN D) 2000 UNITS CAPS Take 2,000 Units by mouth daily.    Historical Provider, MD  digoxin (LANOXIN) 0.125 MG tablet Take 1 tablet (0.125 mg total) by mouth daily. 08/26/13   Fredirick Maudlin, MD  febuxostat (ULORIC) 40 MG tablet Take 40 mg by mouth daily.    Historical Provider, MD  furosemide (LASIX) 20 MG tablet Take 3 tablets (60 mg total) by mouth daily. 01/29/14   Wilburt Finlay, PA-C  gabapentin (NEURONTIN) 300 MG capsule Take 300 mg by mouth at bedtime.    Historical Provider, MD  hydroxychloroquine (PLAQUENIL) 200 MG tablet Take 1 tablet by mouth Twice daily. 07/19/12   Historical Provider, MD  Hypromellose (GENTEAL OP) Place 1 drop  into both eyes daily as needed. Tired Eyes    Historical Provider, MD  metoprolol succinate (TOPROL-XL) 12.5 mg TB24 24 hr tablet Take 1.5 tablets (37.5 mg total) by mouth every 12 (twelve) hours. 01/29/14   Wilburt Finlay, PA-C  nitroGLYCERIN (NITROSTAT) 0.4 MG SL tablet Place 1 tablet (0.4 mg total) under the tongue every 5 (five) minutes as needed for chest pain. 01/16/13   Roger A Arguello, PA-C  omeprazole (PRILOSEC) 20 MG capsule Take 20 mg by mouth daily.    Historical Provider, MD  potassium chloride (K-DUR,KLOR-CON) 10 MEQ tablet Take 10 mEq by mouth 2 (two) times daily.  07/24/13   Historical Provider, MD  predniSONE (DELTASONE) 1 MG tablet Take 2 mg by mouth daily with breakfast.  12/25/13   Historical Provider, MD  traMADol (ULTRAM) 50 MG tablet Take 1 tablet (50 mg total) by mouth every 6 (six) hours as needed for moderate pain. 01/29/14   Wilburt Finlay, PA-C  warfarin (COUMADIN) 5 MG tablet Take 2.5-5 mg by mouth daily. Take 1 tablet daily except 1/2 tablet on Mondays and Thursdays 07/08/13   Laqueta Linden, MD   BP 116/80  Pulse 105  Resp 15  Ht 5\' 5"  (1.651 m)  Wt 167 lb (75.751 kg)  BMI 27.79 kg/m2  SpO2 100% Physical Exam  Constitutional: She appears well-developed and well-nourished.  HENT:  Head: Atraumatic.  Eyes:  1-2 mm, and equal, minimally reactive  Neck: Neck supple.  Cardiovascular: Regular rhythm.   Pulmonary/Chest:  Coarse breath sounds bilateral, bagged  Abdominal: Soft.  Musculoskeletal: She exhibits no edema.  Neurological:  Unresponsive  Skin: Skin is dry. No rash noted.  Psychiatric:  Unresponsive    ED Course  INTUBATION Date/Time: 02/16/2014 7:36 AM Performed by: 04/18/2014. Authorized by: Gilda Crease Consent: Verbal consent not obtained. written consent not obtained. The procedure was performed in an emergent situation. Patient identity confirmed: arm band, provided demographic data and hospital-assigned identification  number Time out: Immediately prior to procedure a "time out" was called to verify the correct patient, procedure, equipment, support staff and site/side marked as required. Indications: respiratory failure,  airway protection and  hypercapnia Intubation method: video-assisted Patient status: paralyzed (RSI) Sedatives: etomidate Paralytic: succinylcholine Laryngoscope size: Mac 4 Tube size: 7.5 mm Tube type: cuffed Number of attempts: 1 Cricoid pressure: no Cords visualized: yes Post-procedure assessment: chest rise and CO2 detector Breath sounds: equal Cuff inflated: yes ETT to lip: 23 cm Patient tolerance: Patient tolerated the procedure well with no immediate complications.    Cardiopulmonary Resuscitation (CPR) Procedure Note Directed/Performed by: Gilda Crease I personally directed ancillary staff and/or performed CPR  in an effort to regain return of spontaneous circulation and to maintain cardiac, neuro and systemic perfusion.   Labs Review Labs Reviewed  COMPREHENSIVE METABOLIC PANEL - Abnormal; Notable for the following:    Sodium 148 (*)    Glucose, Bld 190 (*)    BUN 28 (*)    Creatinine, Ser 2.11 (*)    Total Protein 4.9 (*)    Albumin 2.3 (*)    AST 260 (*)    ALT 114 (*)    GFR calc non Af Amer 21 (*)    GFR calc Af Amer 25 (*)    Anion gap 16 (*)    All other components within normal limits  TROPONIN I - Abnormal; Notable for the following:    Troponin I 0.52 (*)    All other components within normal limits  PRO B NATRIURETIC PEPTIDE - Abnormal; Notable for the following:    Pro B Natriuretic peptide (BNP) 13806.0 (*)    All other components within normal limits  PROTIME-INR - Abnormal; Notable for the following:    Prothrombin Time 63.8 (*)    INR 7.51 (*)    All other components within normal limits  I-STAT CG4 LACTIC ACID, ED - Abnormal; Notable for the following:    Lactic Acid, Venous 5.55 (*)    All other components within normal limits   CBG MONITORING, ED - Abnormal; Notable for the following:    Glucose-Capillary 120 (*)    All other components within normal limits  I-STAT TROPOININ, ED - Abnormal; Notable for the following:    Troponin i, poc 0.39 (*)    All other components within normal limits  CBC WITH DIFFERENTIAL    Imaging Review Dg Chest Port 1 View  2014/03/06   CLINICAL DATA:  Respiratory distress.  ET tube placement.  CPR.  EXAM: PORTABLE CHEST - 1 VIEW  COMPARISON:  Most recent 01/27/2014.  FINDINGS: Considerable overlying telemetry leads and support apparatus. ETT roughly 3.7 cm above the carina. Orogastric tube tip appears to be within the stomach. Dual lead pacer. Cardiac enlargement. Extensive airspace disease in the RIGHT middle to lower lung zone, likely pneumonia. Thoracic atherosclerosis. Probable subcutaneous air along the RIGHT chest wall (arrows). No definite pneumothorax. Marked worsening aeration compared with priors.  IMPRESSION: Suboptimal portable film due to overlying support apparatus.  Extensive airspace disease in the RIGHT mid to lower lung zone consistent with pneumonia.  ET tube 3.7 cm above carina.  Suspected subcutaneous air along the RIGHT chest wall. No RIGHT pneumothorax is visualized on this supine radiograph but index of suspicion is increased. Findings discussed with ordering provider.   Electronically Signed   By: Davonna Belling M.D.   On: 2014-03-06 14:17     EKG Interpretation   Date/Time:  Sunday Mar 06, 2014 13:03:15 EDT Ventricular Rate:  116 PR Interval:  252 QRS Duration: 147 QT Interval:  455 QTC Calculation: 632 R Axis:   -179 Text Interpretation:  Sinus tachycardia Prolonged PR interval Biatrial  enlargement Right bundle branch block (new) Lateral infarct, age  indeterminate Confirmed by POLLINA  MD, CHRISTOPHER [id number]) on 02/16/2014  7:39:02 AM      MDM   Final diagnoses:  None   cardiopulmonary arrest, suspected V. tach/V. fib arrest  Patient presented to  the emergency department by EMS after cardiac arrest at nursing facility. Patient had an AED Mister chalk, so presumably initial rhythm was V. tach or V. fib. She did regain pulses after shock and  was brought to the ER by EMS. She reportedly had been fairly stable during the transport. At arrival to the ER, however, she initially had faint pulses but quickly became pulseless. CPR was initiated and patient was administered epinephrine. We checked the pulses after administration of epinephrine revealed that she had pulses and a detectable blood pressure. Patient remained in this condition for a period of time and then became bradycardic to a rate of 60 which was her pacer activity. There were no pulses. CPR was reinitiated and patient administered epinephrine with results. At this point [facility] protocol was initiated including levophed. Patient also had the [facility] airway exchanged for an endotracheal tube.  At this point I discussed with the family the patient's clinical condition. I shared with them I believe that there was a strong possibility that she would not survive. They did wish to continue with the current treatment plan. Upon returning to the room, the patient coded once again. At this point she had no response to CPR and 2 doses of epinephrine and was declared dead. Case discussed briefly with Doctor Janna Arch, who was on-call for the patient's primary care physician Doctor Fagan. Death certificate will be forwarded to their office. Patient is not a medical examiner's case.  CRITICAL CARE Performed by: Gilda Crease   Total critical care time:  Critical care time was exclusive of separately billable procedures and treating other patients.  Critical care was necessary to treat or prevent imminent or life-threatening deterioration.  Critical care was time spent personally by me on the following activities: development of treatment plan with patient and/or surrogate as well as  nursing, discussions with consultants, evaluation of patient's response to treatment, examination of patient, obtaining history from patient or surrogate, ordering and performing treatments and interventions, ordering and review of laboratory studies, ordering and review of radiographic studies, pulse oximetry and re-evaluation of patient's condition.   Gilda Crease, MD 02/16/14 [phone]

## 2014-02-18 ENCOUNTER — Ambulatory Visit: Payer: Medicare Other | Admitting: Cardiovascular Disease

## 2014-02-24 ENCOUNTER — Encounter: Payer: Self-pay | Admitting: Cardiovascular Disease

## 2014-03-10 NOTE — ED Notes (Signed)
Spoke with pharmacy sending IV drips

## 2014-03-10 NOTE — ED Notes (Signed)
Code Narrator will not allow code end.

## 2014-03-10 NOTE — Code Documentation (Signed)
Patient time of death occurred at 1400.

## 2014-03-10 NOTE — Code Documentation (Signed)
Family at beside. Family given emotional support. 

## 2014-03-10 NOTE — ED Notes (Signed)
[facility] home, [place], picked up patient from Pod C room 26. Family present at time.

## 2014-03-10 NOTE — ED Notes (Signed)
Per EMS patient found unresponsive at nursing home CPR started by staff Shocked once by fire department and once by EMS [facility] airway established IO placed left shoulder. EMS lost pulses upon arrival to ED. EDP at bedside ED staff started CPR 1255.

## 2014-03-10 NOTE — ED Notes (Signed)
Spoke with [facility] transporting patient from POD D to POD C awaiting funeral home.

## 2014-03-10 NOTE — ED Notes (Signed)
EMS reported while enroute c02 33.

## 2014-03-10 NOTE — ED Notes (Signed)
Security logged patient in and out of [place].

## 2014-03-10 NOTE — ED Notes (Signed)
Lab results given to Dr.Pollina. 

## 2014-03-10 NOTE — ED Notes (Signed)
Patient's blood pressure 104/89 heart rate 60 Paced rhythm EDP at bedside.

## 2014-03-10 NOTE — ED Notes (Signed)
Critical lab results given to Dr Pollina  

## 2014-03-10 NOTE — Code Documentation (Signed)
Family updated as to patient's status.

## 2014-03-10 DEATH — deceased

## 2014-04-20 NOTE — Telephone Encounter (Signed)
Encounter open in error 

## 2014-05-21 ENCOUNTER — Encounter: Payer: Medicare Other | Admitting: Internal Medicine

## 2014-06-18 ENCOUNTER — Encounter (HOSPITAL_COMMUNITY): Payer: Self-pay | Admitting: Cardiovascular Disease

## 2014-07-23 IMAGING — CR DG CHEST 2V
2 series · 2 of 2 positions shown · non-contrast
Comparison: 01/16/2014

CLINICAL DATA: Chest pain and shortness of breath.

EXAM:
CHEST  2 VIEW

[view not recorded (1 of 2)]
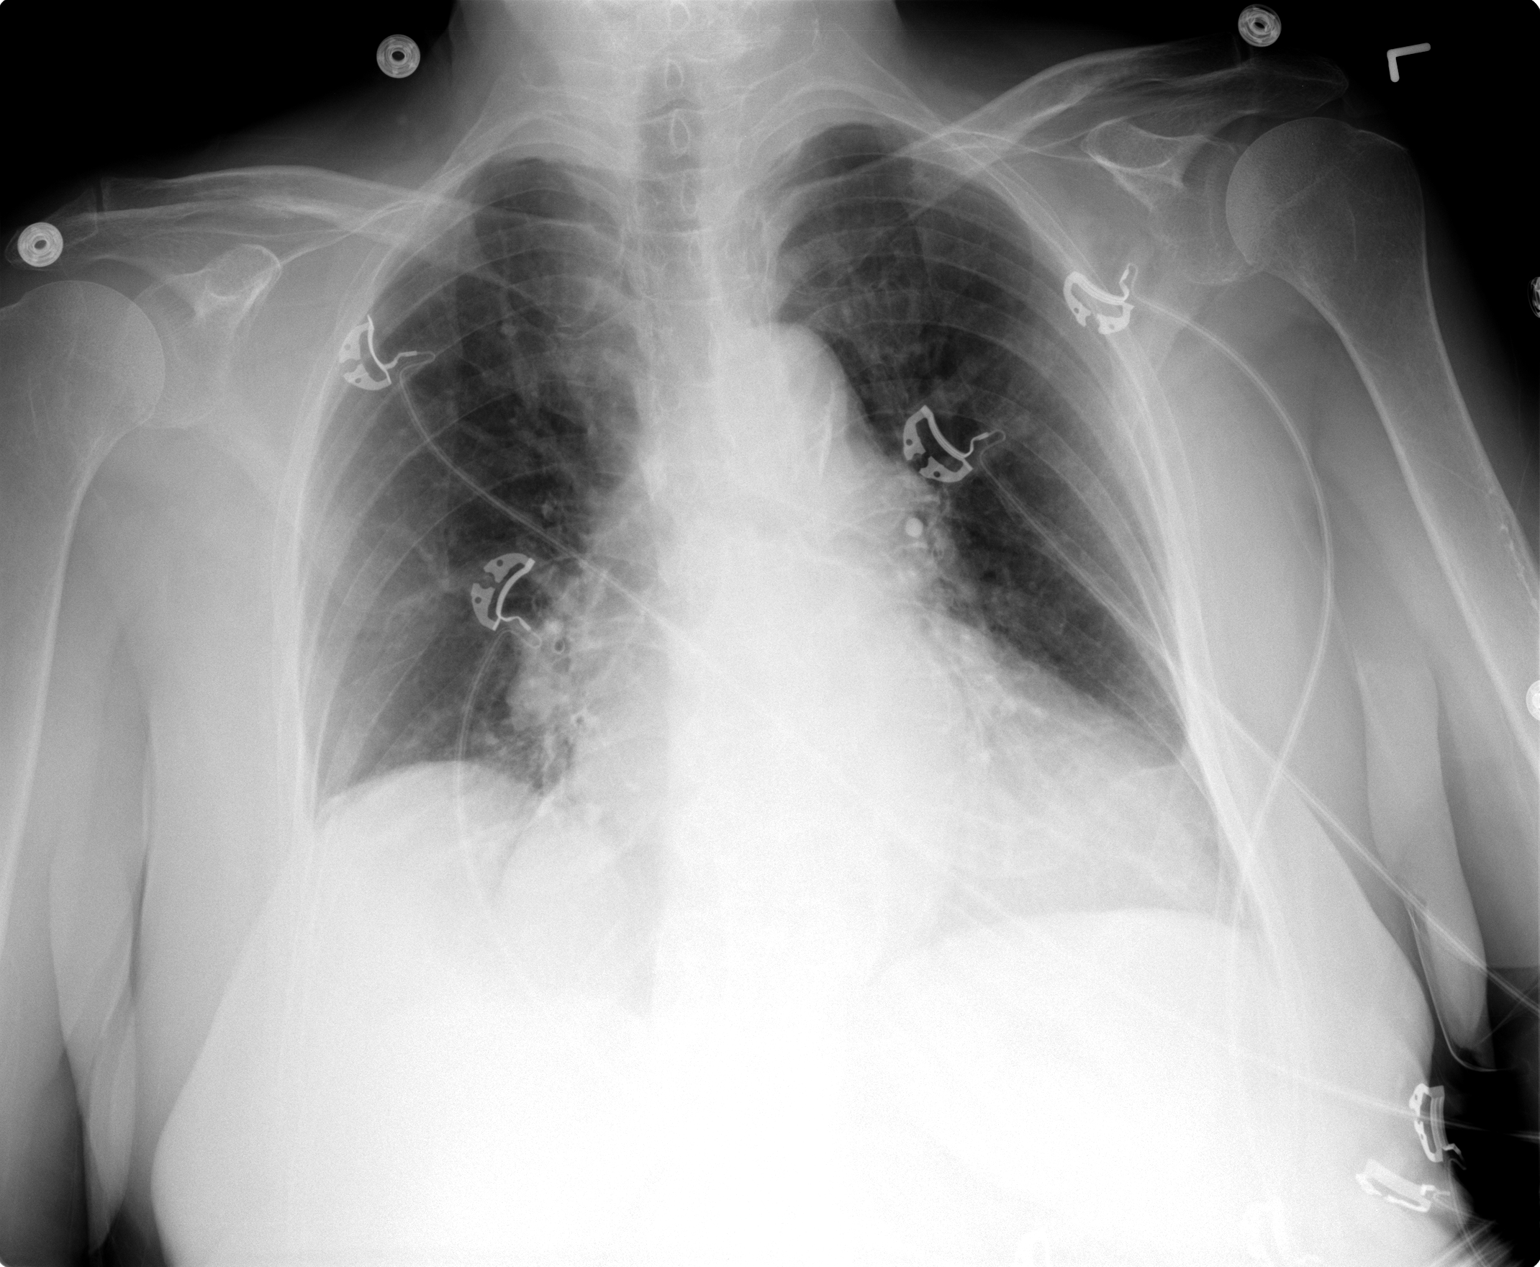

[view not recorded (2 of 2)]
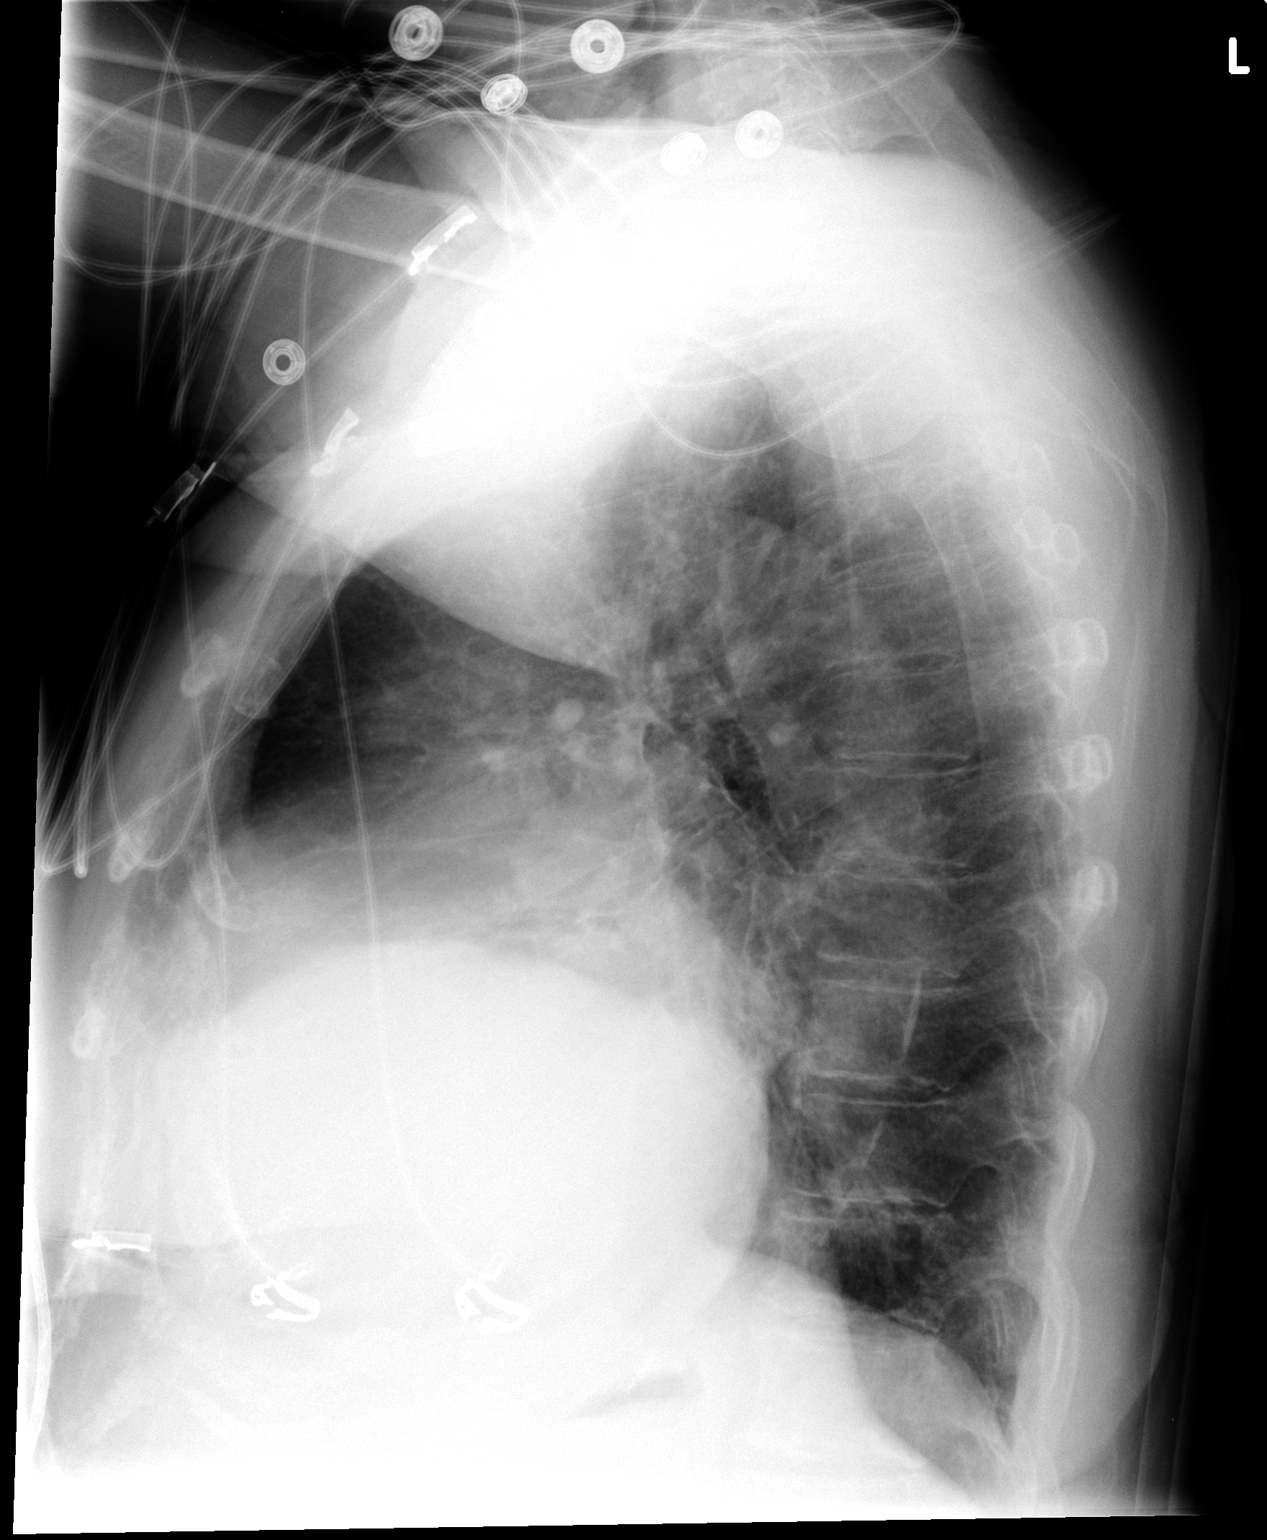

[2 of 2 positions shown; findings below may reference images not displayed]

FINDINGS: Enlargement of the cardiac silhouette is unchanged. Thoracic aorta
is mildly tortuous. Thoracic aortic calcification is again seen.
Eventration of the right hemidiaphragm is unchanged with associated
right basilar atelectasis. The lungs are otherwise clear. There is
no pleural effusion or pneumothorax. No acute osseous abnormality is
seen.
IMPRESSION: No active cardiopulmonary disease.

## 2014-07-24 IMAGING — CT CT ABD-PELV W/O CM
2 of 4 series · 16 of 46 positions shown, 18 images · non-contrast
Comparison: 12/14/2013

CLINICAL DATA: Abdominal wall pain, on Coumadin, history
hypertension, CHF, appendectomy, mitral valve prolapse, atrial
fibrillation, MI

EXAM:
CT ABDOMEN AND PELVIS WITHOUT CONTRAST
TECHNIQUE: Multidetector CT imaging of the abdomen and pelvis was performed
following the standard protocol without IV contrast. Sagittal and
coronal MPR images reconstructed from axial data set. Oral contrast
was administered.

[Series 2: abdomen/pelvis w/o contrast · axial · non-contrast · 0.80mm/px · z∈[-413,-3]mm · 13 of 90 slices shown, 15 images]
[im 4/90  soft-tissue]
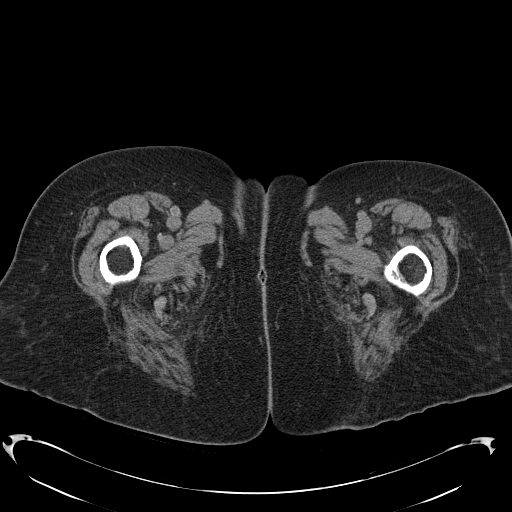
[im 4/90  bone]
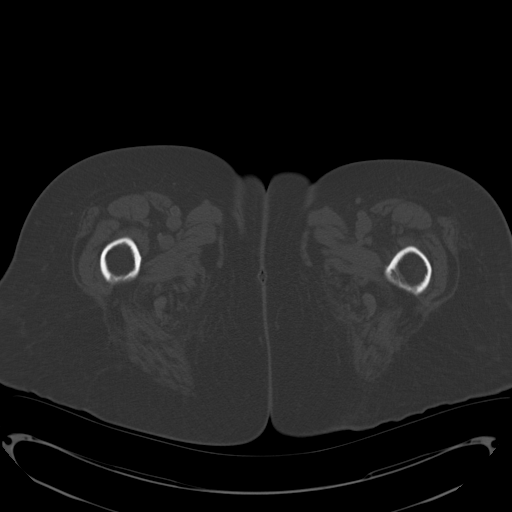
[im 12/90  soft-tissue]
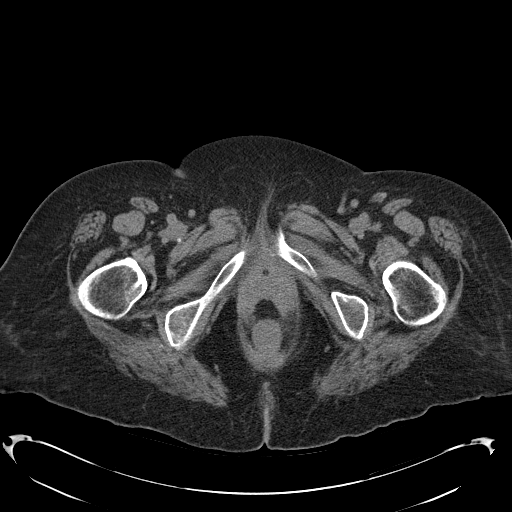
[im 19/90  soft-tissue]
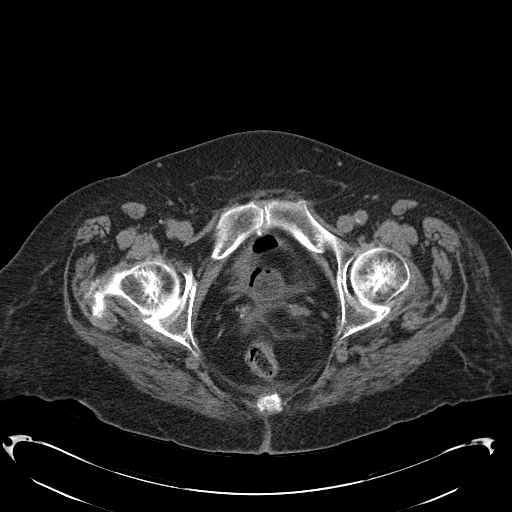
[im 26/90  soft-tissue]
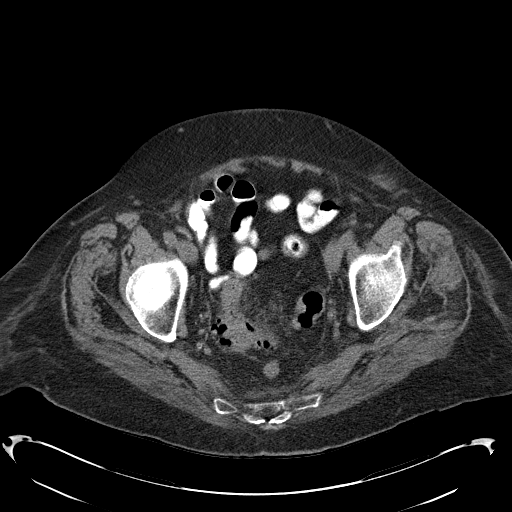
[im 30/90  soft-tissue]
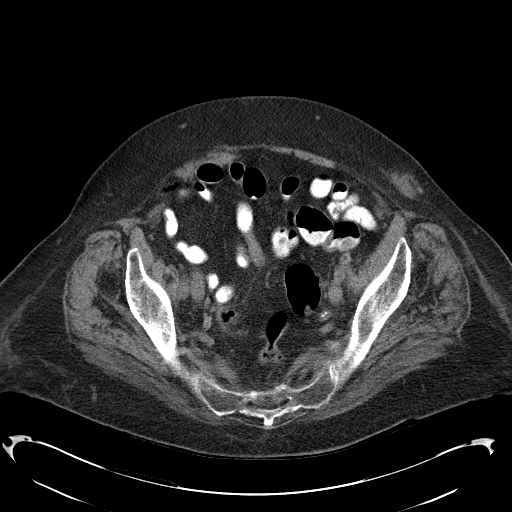
[im 38/90  soft-tissue]
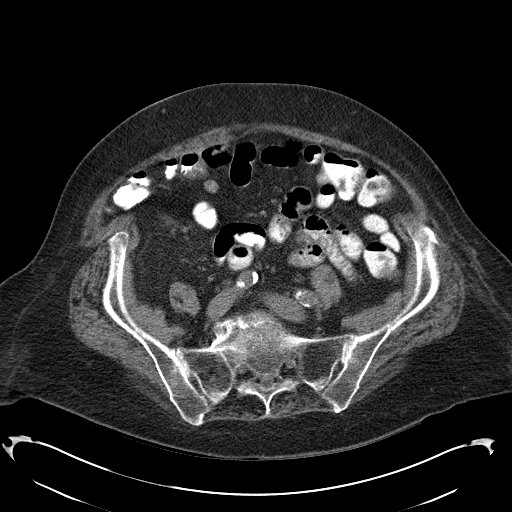
[im 45/90  soft-tissue]
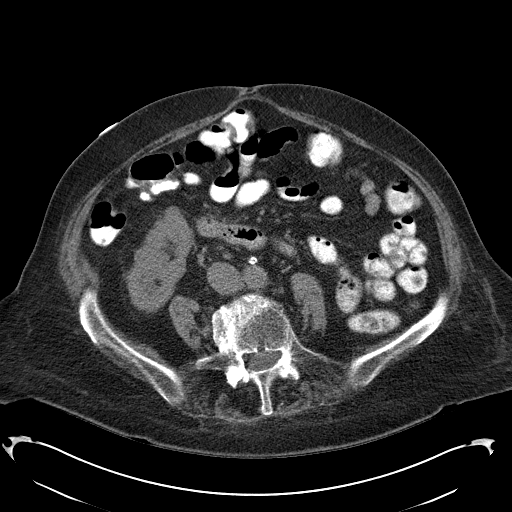
[im 52/90  soft-tissue]
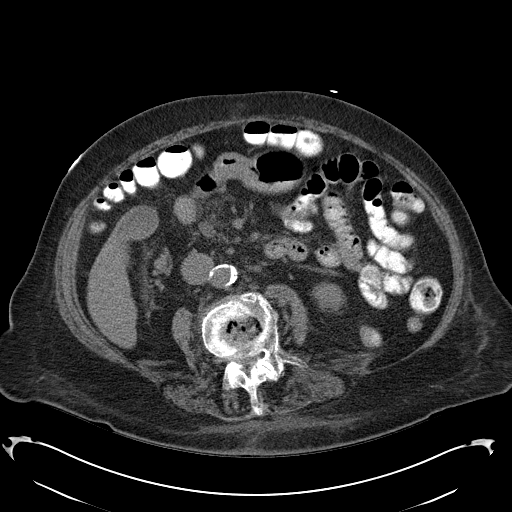
[im 60/90  soft-tissue]
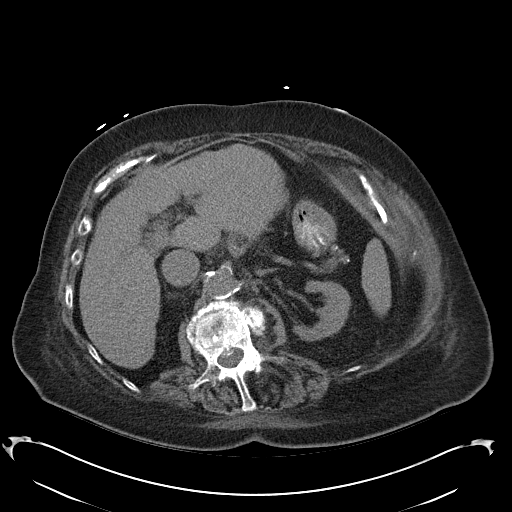
[im 60/90  bone]
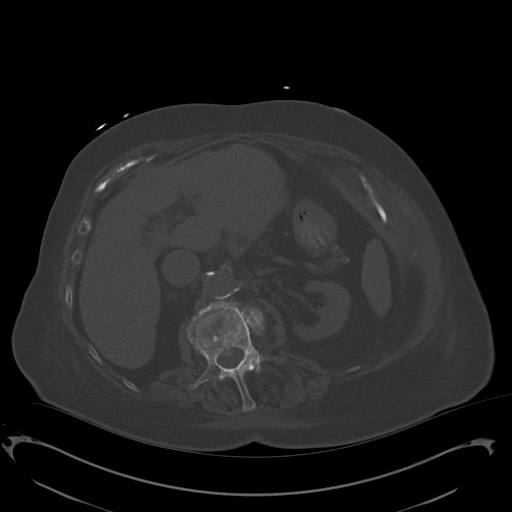
[im 64/90  soft-tissue]
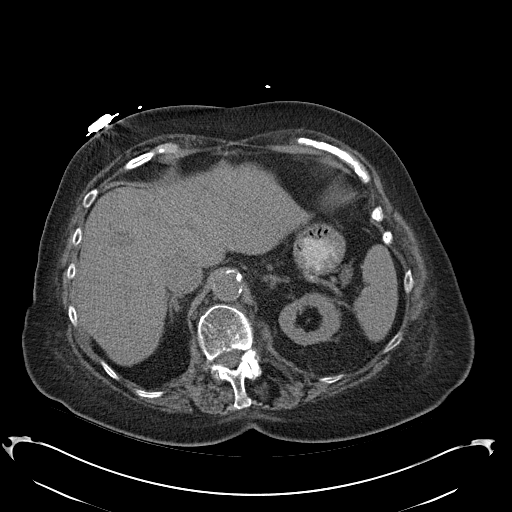
[im 71/90  soft-tissue]
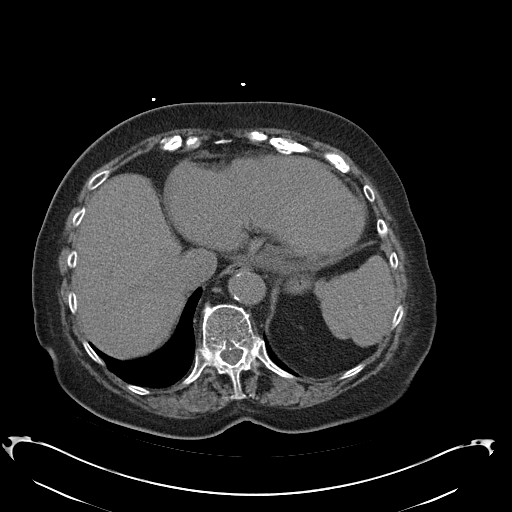
[im 78/90  soft-tissue]
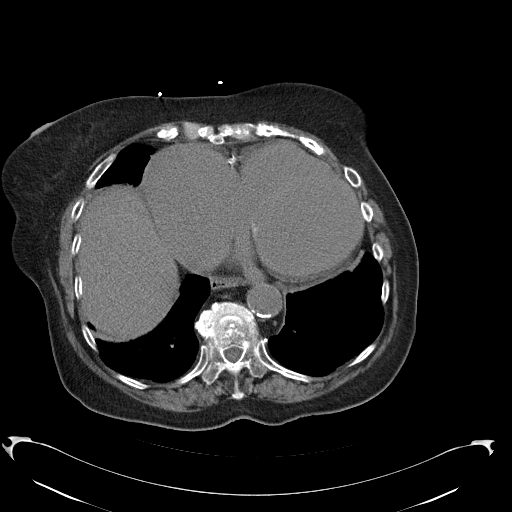
[im 86/90  soft-tissue]
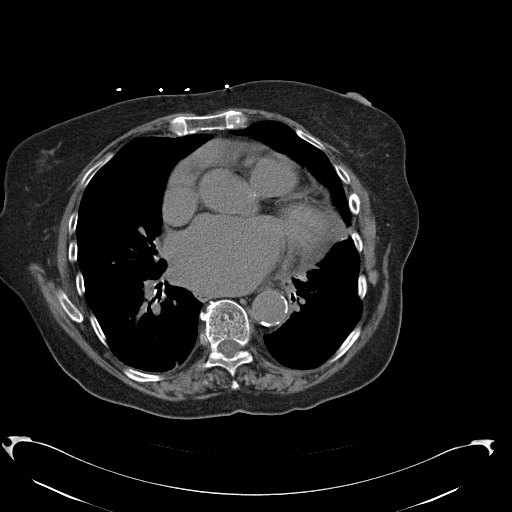

[Series 3: mpr cor (id) · coronal · 0.75mm/px · 3 of 102 slices shown]
[im 34/102  soft-tissue]
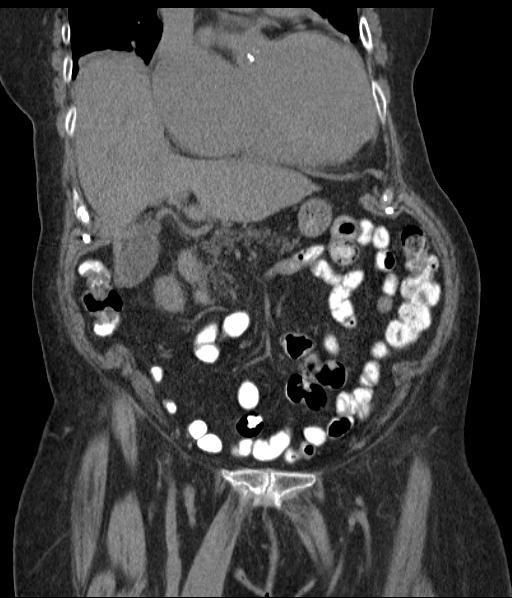
[im 45/102  soft-tissue]
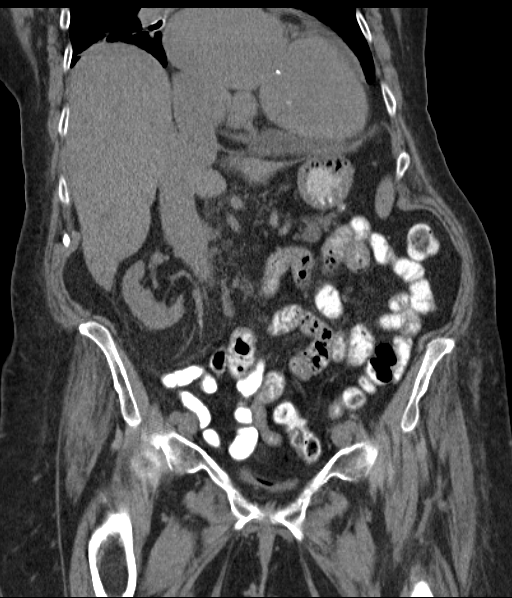
[im 57/102  soft-tissue]
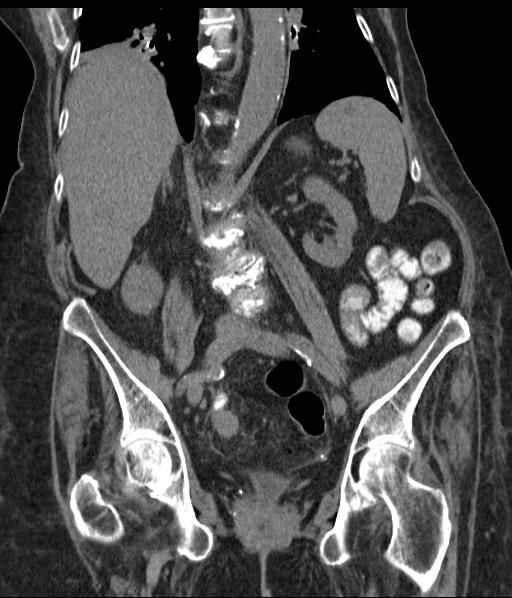

[16 of 46 positions shown; findings below may reference images not displayed]

FINDINGS: Enlargement of cardiac chambers particularly LEFT atrium.

Scattered atherosclerotic calcifications aorta and coronary
arteries.

Atelectasis RIGHT lower lobe.

BILATERAL renal cortical atrophy with small cyst and nonobstructing
calculus at inferior pole RIGHT kidney.

Within limits of a nonenhanced exam, liver, spleen, pancreas,
kidneys, and adrenal glands otherwise normal appearance.

Dependent calcification within gallbladder question tiny gallstone.

Stomach and small bowel loops grossly normal appearance for
technique.

Focus of subcutaneous infiltration identified in the anterior LEFT
pelvis at site of previously identified subcutaneous hematoma,
decreased in size since previous study.

No new abdominal wall hemorrhage seen.

Sigmoid diverticulosis with wall thickening and minimal pericolic
inflammatory changes at the distal sigmoid colon consistent with
acute diverticulitis little changed from previous exam.

Minimal free pelvic fluid, decreased.

Remainder of colon normal appearance.

Foley catheter decompresses urinary bladder.

No mass, adenopathy, free air, hernia, or acute osseous findings.
IMPRESSION: Decreased size of small subcutaneous hematoma in the anterior LEFT
pelvis.

Minimal diverticulitis changes of the distal sigmoid colon, little
changed from previous exam.

Question cholelithiasis.

Tiny nonobstructing RIGHT renal calculus.

Enlargement of cardiac chambers with extensive atherosclerotic
disease and subsegmental atelectasis RIGHT lower lobe.
